# Patient Record
Sex: Male | Born: 1945 | Race: White | Hispanic: No | Smoking: Former smoker
Health system: Southern US, Community
[De-identification: ages and names within clinical notes are randomized; demographics above are authoritative.]

## PROBLEM LIST (undated history)

## (undated) DIAGNOSIS — F32A Depression, unspecified: Secondary | ICD-10-CM

## (undated) DIAGNOSIS — F329 Major depressive disorder, single episode, unspecified: Secondary | ICD-10-CM

## (undated) DIAGNOSIS — T7840XA Allergy, unspecified, initial encounter: Secondary | ICD-10-CM

## (undated) DIAGNOSIS — N179 Acute kidney failure, unspecified: Secondary | ICD-10-CM

## (undated) DIAGNOSIS — F1010 Alcohol abuse, uncomplicated: Secondary | ICD-10-CM

## (undated) DIAGNOSIS — I471 Supraventricular tachycardia, unspecified: Secondary | ICD-10-CM

## (undated) DIAGNOSIS — S0181XA Laceration without foreign body of other part of head, initial encounter: Secondary | ICD-10-CM

## (undated) DIAGNOSIS — E785 Hyperlipidemia, unspecified: Secondary | ICD-10-CM

## (undated) DIAGNOSIS — I4892 Unspecified atrial flutter: Secondary | ICD-10-CM

## (undated) DIAGNOSIS — I10 Essential (primary) hypertension: Secondary | ICD-10-CM

## (undated) DIAGNOSIS — D649 Anemia, unspecified: Secondary | ICD-10-CM

## (undated) DIAGNOSIS — I771 Stricture of artery: Secondary | ICD-10-CM

## (undated) DIAGNOSIS — Z9289 Personal history of other medical treatment: Secondary | ICD-10-CM

## (undated) DIAGNOSIS — I2510 Atherosclerotic heart disease of native coronary artery without angina pectoris: Secondary | ICD-10-CM

## (undated) DIAGNOSIS — N320 Bladder-neck obstruction: Secondary | ICD-10-CM

## (undated) DIAGNOSIS — L8990 Pressure ulcer of unspecified site, unspecified stage: Secondary | ICD-10-CM

## (undated) DIAGNOSIS — N189 Chronic kidney disease, unspecified: Secondary | ICD-10-CM

## (undated) DIAGNOSIS — M1990 Unspecified osteoarthritis, unspecified site: Secondary | ICD-10-CM

## (undated) DIAGNOSIS — S82401A Unspecified fracture of shaft of right fibula, initial encounter for closed fracture: Secondary | ICD-10-CM

## (undated) DIAGNOSIS — D62 Acute posthemorrhagic anemia: Secondary | ICD-10-CM

## (undated) DIAGNOSIS — S93326A Dislocation of tarsometatarsal joint of unspecified foot, initial encounter: Secondary | ICD-10-CM

## (undated) DIAGNOSIS — J942 Hemothorax: Secondary | ICD-10-CM

## (undated) DIAGNOSIS — K219 Gastro-esophageal reflux disease without esophagitis: Secondary | ICD-10-CM

## (undated) DIAGNOSIS — F419 Anxiety disorder, unspecified: Secondary | ICD-10-CM

## (undated) HISTORY — DX: Laceration without foreign body of other part of head, initial encounter: S01.81XA

## (undated) HISTORY — DX: Hyperlipidemia, unspecified: E78.5

## (undated) HISTORY — DX: Pressure ulcer of unspecified site, unspecified stage: L89.90

## (undated) HISTORY — PX: TONSILECTOMY, ADENOIDECTOMY, BILATERAL MYRINGOTOMY AND TUBES: SHX2538

## (undated) HISTORY — DX: Alcohol abuse, uncomplicated: F10.10

## (undated) HISTORY — DX: Unspecified atrial flutter: I48.92

## (undated) HISTORY — DX: Supraventricular tachycardia, unspecified: I47.10

## (undated) HISTORY — DX: Acute posthemorrhagic anemia: D62

## (undated) HISTORY — DX: Acute kidney failure, unspecified: N17.9

## (undated) HISTORY — PX: APPENDECTOMY: SHX54

## (undated) HISTORY — DX: Personal history of other medical treatment: Z92.89

## (undated) HISTORY — DX: Chronic kidney disease, unspecified: N18.9

## (undated) HISTORY — DX: Essential (primary) hypertension: I10

## (undated) HISTORY — DX: Stricture of artery: I77.1

## (undated) HISTORY — DX: Bladder-neck obstruction: N32.0

## (undated) HISTORY — DX: Hemothorax: J94.2

## (undated) HISTORY — DX: Unspecified fracture of shaft of right fibula, initial encounter for closed fracture: S82.401A

## (undated) HISTORY — DX: Allergy, unspecified, initial encounter: T78.40XA

## (undated) HISTORY — DX: Supraventricular tachycardia: I47.1

## (undated) HISTORY — DX: Anemia, unspecified: D64.9

## (undated) HISTORY — DX: Dislocation of tarsometatarsal joint of unspecified foot, initial encounter: S93.326A

## (undated) HISTORY — PX: HERNIA REPAIR: SHX51

---

## 1998-12-30 ENCOUNTER — Encounter: Payer: Self-pay | Admitting: Internal Medicine

## 1998-12-30 LAB — CONVERTED CEMR LAB

## 2002-05-26 ENCOUNTER — Encounter: Payer: Self-pay | Admitting: Internal Medicine

## 2002-09-05 ENCOUNTER — Encounter: Payer: Self-pay | Admitting: Internal Medicine

## 2002-09-12 ENCOUNTER — Encounter: Payer: Self-pay | Admitting: Internal Medicine

## 2004-02-26 ENCOUNTER — Ambulatory Visit: Payer: Self-pay | Admitting: Internal Medicine

## 2004-05-19 ENCOUNTER — Ambulatory Visit: Payer: Self-pay | Admitting: Internal Medicine

## 2004-05-27 ENCOUNTER — Ambulatory Visit: Payer: Self-pay | Admitting: Internal Medicine

## 2004-09-23 ENCOUNTER — Ambulatory Visit: Payer: Self-pay | Admitting: Internal Medicine

## 2004-09-30 ENCOUNTER — Ambulatory Visit: Payer: Self-pay | Admitting: Internal Medicine

## 2004-12-02 ENCOUNTER — Ambulatory Visit: Payer: Self-pay | Admitting: Internal Medicine

## 2004-12-14 ENCOUNTER — Ambulatory Visit: Payer: Self-pay | Admitting: Internal Medicine

## 2004-12-20 ENCOUNTER — Ambulatory Visit: Payer: Self-pay | Admitting: Internal Medicine

## 2004-12-23 ENCOUNTER — Ambulatory Visit: Payer: Self-pay | Admitting: Internal Medicine

## 2005-02-03 ENCOUNTER — Ambulatory Visit: Payer: Self-pay | Admitting: Internal Medicine

## 2005-03-03 ENCOUNTER — Ambulatory Visit (HOSPITAL_COMMUNITY): Admission: RE | Admit: 2005-03-03 | Discharge: 2005-03-03 | Payer: Self-pay | Admitting: Surgery

## 2005-06-09 ENCOUNTER — Ambulatory Visit: Payer: Self-pay | Admitting: Internal Medicine

## 2005-11-01 ENCOUNTER — Emergency Department (HOSPITAL_COMMUNITY): Admission: EM | Admit: 2005-11-01 | Discharge: 2005-11-01 | Payer: Self-pay | Admitting: Emergency Medicine

## 2005-12-07 ENCOUNTER — Ambulatory Visit: Payer: Self-pay | Admitting: Internal Medicine

## 2005-12-14 ENCOUNTER — Ambulatory Visit: Payer: Self-pay | Admitting: Internal Medicine

## 2006-04-05 ENCOUNTER — Ambulatory Visit: Payer: Self-pay | Admitting: Internal Medicine

## 2006-04-05 LAB — CONVERTED CEMR LAB
ALT: 29 units/L (ref 0–40)
AST: 38 units/L — ABNORMAL HIGH (ref 0–37)
Albumin: 3.6 g/dL (ref 3.5–5.2)
Alkaline Phosphatase: 37 units/L — ABNORMAL LOW (ref 39–117)
Bilirubin, Direct: 0.2 mg/dL (ref 0.0–0.3)
Chol/HDL Ratio, serum: 3.2
Cholesterol: 140 mg/dL (ref 0–200)
HDL: 44.4 mg/dL (ref >39.0)
LDL Cholesterol: 82 mg/dL (ref 0–99)
Total Bilirubin: 0.7 mg/dL (ref 0.3–1.2)
Total Protein: 6.4 g/dL (ref 6.0–8.3)
Triglyceride fasting, serum: 67 mg/dL (ref 0–149)
VLDL: 13 mg/dL (ref 0–40)

## 2006-09-29 ENCOUNTER — Emergency Department (HOSPITAL_COMMUNITY): Admission: EM | Admit: 2006-09-29 | Discharge: 2006-09-29 | Payer: Self-pay | Admitting: Emergency Medicine

## 2006-10-05 ENCOUNTER — Emergency Department (HOSPITAL_COMMUNITY): Admission: EM | Admit: 2006-10-05 | Discharge: 2006-10-05 | Payer: Self-pay | Admitting: Emergency Medicine

## 2006-12-11 ENCOUNTER — Encounter: Payer: Self-pay | Admitting: Internal Medicine

## 2006-12-11 DIAGNOSIS — M109 Gout, unspecified: Secondary | ICD-10-CM

## 2006-12-11 DIAGNOSIS — K7030 Alcoholic cirrhosis of liver without ascites: Secondary | ICD-10-CM

## 2006-12-11 DIAGNOSIS — R945 Abnormal results of liver function studies: Secondary | ICD-10-CM | POA: Insufficient documentation

## 2006-12-11 DIAGNOSIS — E785 Hyperlipidemia, unspecified: Secondary | ICD-10-CM | POA: Insufficient documentation

## 2006-12-11 DIAGNOSIS — J309 Allergic rhinitis, unspecified: Secondary | ICD-10-CM | POA: Insufficient documentation

## 2007-01-21 ENCOUNTER — Ambulatory Visit: Payer: Self-pay | Admitting: Internal Medicine

## 2007-01-21 LAB — CONVERTED CEMR LAB
ALT: 23 units/L (ref 0–53)
AST: 38 units/L — ABNORMAL HIGH (ref 0–37)
Albumin: 4.1 g/dL (ref 3.5–5.2)
Alkaline Phosphatase: 40 units/L (ref 39–117)
BUN: 13 mg/dL (ref 6–23)
Basophils Absolute: 0 10*3/uL (ref 0.0–0.1)
Basophils Relative: 0.5 % (ref 0.0–1.0)
Bilirubin Urine: NEGATIVE
Bilirubin, Direct: 0.1 mg/dL (ref 0.0–0.3)
Blood in Urine, dipstick: NEGATIVE
CO2: 24 meq/L (ref 19–32)
Calcium: 10.3 mg/dL (ref 8.4–10.5)
Chloride: 114 meq/L — ABNORMAL HIGH (ref 96–112)
Cholesterol: 173 mg/dL (ref 0–200)
Creatinine, Ser: 1.2 mg/dL (ref 0.4–1.5)
Eosinophils Absolute: 0.4 10*3/uL (ref 0.0–0.6)
Eosinophils Relative: 4.2 % (ref 0.0–5.0)
GFR calc Af Amer: 79 mL/min
GFR calc non Af Amer: 65 mL/min
Glucose, Bld: 111 mg/dL — ABNORMAL HIGH (ref 70–99)
Glucose, Urine, Semiquant: NEGATIVE
HCT: 42.6 % (ref 39.0–52.0)
HDL: 37.1 mg/dL — ABNORMAL LOW (ref >39.0)
Hemoglobin: 15 g/dL (ref 13.0–17.0)
Ketones, urine, test strip: NEGATIVE
LDL Cholesterol: 121 mg/dL — ABNORMAL HIGH (ref 0–99)
Lymphocytes Relative: 23.1 % (ref 12.0–46.0)
MCHC: 35.2 g/dL (ref 30.0–36.0)
MCV: 93.6 fL (ref 78.0–100.0)
Monocytes Absolute: 0.8 10*3/uL — ABNORMAL HIGH (ref 0.2–0.7)
Monocytes Relative: 9.6 % (ref 3.0–11.0)
Neutro Abs: 5.6 10*3/uL (ref 1.4–7.7)
Neutrophils Relative %: 62.6 % (ref 43.0–77.0)
Nitrite: NEGATIVE
PSA: 0.3 ng/mL (ref 0.10–4.00)
Platelets: 214 10*3/uL (ref 150–400)
Potassium: 6.1 meq/L (ref 3.5–5.1)
Protein, U semiquant: NEGATIVE
RBC: 4.56 M/uL (ref 4.22–5.81)
RDW: 12.6 % (ref 11.5–14.6)
Sodium: 149 meq/L — ABNORMAL HIGH (ref 135–145)
Specific Gravity, Urine: 1.02
TSH: 1.1 microintl units/mL (ref 0.35–5.50)
Total Bilirubin: 0.6 mg/dL (ref 0.3–1.2)
Total CHOL/HDL Ratio: 4.7
Total Protein: 7.2 g/dL (ref 6.0–8.3)
Triglycerides: 75 mg/dL (ref 0–149)
Urobilinogen, UA: 0.2
VLDL: 15 mg/dL (ref 0–40)
WBC Urine, dipstick: NEGATIVE
WBC: 8.8 10*3/uL (ref 4.5–10.5)
pH: 5

## 2007-01-22 ENCOUNTER — Ambulatory Visit: Payer: Self-pay | Admitting: Internal Medicine

## 2007-01-22 LAB — CONVERTED CEMR LAB: Potassium: 4.6 meq/L (ref 3.5–5.1)

## 2007-01-23 ENCOUNTER — Telehealth: Payer: Self-pay | Admitting: Internal Medicine

## 2007-01-28 ENCOUNTER — Ambulatory Visit: Payer: Self-pay | Admitting: Internal Medicine

## 2007-04-03 ENCOUNTER — Ambulatory Visit: Payer: Self-pay | Admitting: Internal Medicine

## 2007-08-12 ENCOUNTER — Ambulatory Visit: Payer: Self-pay | Admitting: Internal Medicine

## 2007-08-12 LAB — CONVERTED CEMR LAB
ALT: 39 units/L (ref 0–53)
AST: 42 units/L — ABNORMAL HIGH (ref 0–37)
Albumin: 4 g/dL (ref 3.5–5.2)
Alkaline Phosphatase: 40 units/L (ref 39–117)
BUN: 9 mg/dL (ref 6–23)
CO2: 29 meq/L (ref 19–32)
Chloride: 110 meq/L (ref 96–112)
Cholesterol: 195 mg/dL (ref 0–200)
Creatinine, Ser: 1 mg/dL (ref 0.4–1.5)
Glucose, Bld: 100 mg/dL — ABNORMAL HIGH (ref 70–99)
LDL Cholesterol: 125 mg/dL — ABNORMAL HIGH (ref 0–99)
Potassium: 4.1 meq/L (ref 3.5–5.1)
Triglycerides: 135 mg/dL (ref 0–149)

## 2007-11-20 ENCOUNTER — Encounter: Admission: RE | Admit: 2007-11-20 | Discharge: 2007-11-20 | Payer: Self-pay | Admitting: Orthopedic Surgery

## 2007-12-24 ENCOUNTER — Telehealth: Payer: Self-pay | Admitting: Internal Medicine

## 2008-01-13 ENCOUNTER — Telehealth: Payer: Self-pay | Admitting: Internal Medicine

## 2008-01-31 ENCOUNTER — Ambulatory Visit: Payer: Self-pay | Admitting: Internal Medicine

## 2008-01-31 LAB — CONVERTED CEMR LAB
ALT: 47 units/L (ref 0–53)
AST: 49 units/L — ABNORMAL HIGH (ref 0–37)
Albumin: 4.4 g/dL (ref 3.5–5.2)
Alkaline Phosphatase: 40 units/L (ref 39–117)
BUN: 10 mg/dL (ref 6–23)
Basophils Relative: 0.6 % (ref 0.0–3.0)
Blood in Urine, dipstick: NEGATIVE
Chloride: 108 meq/L (ref 96–112)
Creatinine, Ser: 1.1 mg/dL (ref 0.4–1.5)
Eosinophils Relative: 3.4 % (ref 0.0–5.0)
Glucose, Bld: 112 mg/dL — ABNORMAL HIGH (ref 70–99)
Glucose, Urine, Semiquant: NEGATIVE
HCT: 43.9 % (ref 39.0–52.0)
HDL: 39.4 mg/dL (ref >39.0)
MCV: 96.3 fL (ref 78.0–100.0)
Monocytes Relative: 11.5 % (ref 3.0–12.0)
Neutrophils Relative %: 57.2 % (ref 43.0–77.0)
Nitrite: NEGATIVE
PSA: 0.35 ng/mL (ref 0.10–4.00)
Platelets: 190 10*3/uL (ref 150–400)
Potassium: 4.9 meq/L (ref 3.5–5.1)
RBC: 4.55 M/uL (ref 4.22–5.81)
Specific Gravity, Urine: 1.025
Total CHOL/HDL Ratio: 4.9
Total Protein: 7.9 g/dL (ref 6.0–8.3)
VLDL: 28 mg/dL (ref 0–40)
WBC Urine, dipstick: NEGATIVE
WBC: 6.3 10*3/uL (ref 4.5–10.5)
pH: 5.5

## 2008-02-06 ENCOUNTER — Ambulatory Visit: Payer: Self-pay | Admitting: Internal Medicine

## 2008-02-21 ENCOUNTER — Telehealth: Payer: Self-pay | Admitting: Internal Medicine

## 2008-04-17 HISTORY — PX: PARTIAL HIP ARTHROPLASTY: SHX733

## 2008-08-21 ENCOUNTER — Telehealth: Payer: Self-pay | Admitting: Internal Medicine

## 2008-08-28 ENCOUNTER — Inpatient Hospital Stay (HOSPITAL_COMMUNITY): Admission: RE | Admit: 2008-08-28 | Discharge: 2008-09-04 | Payer: Self-pay | Admitting: Orthopedic Surgery

## 2008-08-28 HISTORY — PX: REVISION TOTAL HIP ARTHROPLASTY: SHX766

## 2009-02-05 ENCOUNTER — Ambulatory Visit: Payer: Self-pay | Admitting: Internal Medicine

## 2009-02-05 LAB — CONVERTED CEMR LAB
ALT: 39 units/L (ref 0–53)
Alkaline Phosphatase: 47 units/L (ref 39–117)
Basophils Relative: 0.9 % (ref 0.0–3.0)
Bilirubin, Direct: 0.1 mg/dL (ref 0.0–0.3)
Chloride: 106 meq/L (ref 96–112)
Cholesterol: 220 mg/dL — ABNORMAL HIGH (ref 0–200)
Creatinine, Ser: 1 mg/dL (ref 0.4–1.5)
Eosinophils Relative: 4.6 % (ref 0.0–5.0)
Hemoglobin: 15.7 g/dL (ref 13.0–17.0)
Lymphocytes Relative: 25.4 % (ref 12.0–46.0)
MCV: 96.6 fL (ref 78.0–100.0)
Monocytes Absolute: 0.6 10*3/uL (ref 0.1–1.0)
Neutro Abs: 3.2 10*3/uL (ref 1.4–7.7)
Neutrophils Relative %: 58.5 % (ref 43.0–77.0)
Nitrite: NEGATIVE
Protein, U semiquant: NEGATIVE
RBC: 4.76 M/uL (ref 4.22–5.81)
Sodium: 144 meq/L (ref 135–145)
Total CHOL/HDL Ratio: 5
Total Protein: 7.8 g/dL (ref 6.0–8.3)
Urobilinogen, UA: 0.2
VLDL: 32 mg/dL (ref 0.0–40.0)
WBC Urine, dipstick: NEGATIVE
WBC: 5.6 10*3/uL (ref 4.5–10.5)

## 2009-02-12 ENCOUNTER — Ambulatory Visit: Payer: Self-pay | Admitting: Internal Medicine

## 2009-03-19 ENCOUNTER — Telehealth: Payer: Self-pay | Admitting: Internal Medicine

## 2009-05-14 ENCOUNTER — Ambulatory Visit: Payer: Self-pay | Admitting: Internal Medicine

## 2009-05-14 DIAGNOSIS — K219 Gastro-esophageal reflux disease without esophagitis: Secondary | ICD-10-CM | POA: Insufficient documentation

## 2009-05-19 LAB — CONVERTED CEMR LAB
HDL: 54.9 mg/dL (ref >39.00)
TSH: 1.24 microintl units/mL (ref 0.35–5.50)
Total Bilirubin: 1 mg/dL (ref 0.3–1.2)
Total CHOL/HDL Ratio: 3
VLDL: 26.6 mg/dL (ref 0.0–40.0)

## 2009-05-21 ENCOUNTER — Telehealth: Payer: Self-pay | Admitting: Internal Medicine

## 2009-06-11 ENCOUNTER — Telehealth: Payer: Self-pay | Admitting: Internal Medicine

## 2009-06-17 ENCOUNTER — Telehealth: Payer: Self-pay | Admitting: Internal Medicine

## 2009-08-03 ENCOUNTER — Encounter
Admission: RE | Admit: 2009-08-03 | Discharge: 2009-08-03 | Payer: Self-pay | Admitting: Physical Medicine & Rehabilitation

## 2009-08-03 ENCOUNTER — Ambulatory Visit: Payer: Self-pay | Admitting: Physical Medicine & Rehabilitation

## 2009-09-14 ENCOUNTER — Telehealth: Payer: Self-pay | Admitting: Internal Medicine

## 2009-09-15 ENCOUNTER — Encounter (INDEPENDENT_AMBULATORY_CARE_PROVIDER_SITE_OTHER): Payer: Self-pay | Admitting: *Deleted

## 2010-02-17 ENCOUNTER — Ambulatory Visit: Payer: Self-pay | Admitting: Family Medicine

## 2010-03-02 ENCOUNTER — Inpatient Hospital Stay (HOSPITAL_COMMUNITY): Admission: EM | Admit: 2010-03-02 | Discharge: 2010-03-03 | Payer: Self-pay | Admitting: Emergency Medicine

## 2010-03-15 ENCOUNTER — Ambulatory Visit: Payer: Self-pay | Admitting: Internal Medicine

## 2010-03-15 DIAGNOSIS — D649 Anemia, unspecified: Secondary | ICD-10-CM

## 2010-03-17 ENCOUNTER — Telehealth: Payer: Self-pay | Admitting: Internal Medicine

## 2010-03-17 LAB — CONVERTED CEMR LAB
Alkaline Phosphatase: 60 units/L (ref 39–117)
Bilirubin, Direct: 0.2 mg/dL (ref 0.0–0.3)
CO2: 27 meq/L (ref 19–32)
Calcium: 8.8 mg/dL (ref 8.4–10.5)
Chloride: 98 meq/L (ref 96–112)
Eosinophils Relative: 1.7 % (ref 0.0–5.0)
HCT: 33.4 % — ABNORMAL LOW (ref 39.0–52.0)
Hemoglobin: 11.4 g/dL — ABNORMAL LOW (ref 13.0–17.0)
Lymphs Abs: 1.2 10*3/uL (ref 0.7–4.0)
Monocytes Relative: 12.1 % — ABNORMAL HIGH (ref 3.0–12.0)
Neutro Abs: 9.9 10*3/uL — ABNORMAL HIGH (ref 1.4–7.7)
RBC: 3.39 M/uL — ABNORMAL LOW (ref 4.22–5.81)
Sed Rate: >120 mm/hr (ref 0–22)
Sodium: 134 meq/L — ABNORMAL LOW (ref 135–145)
Total Protein: 6.9 g/dL (ref 6.0–8.3)
Vitamin B-12: 932 pg/mL — ABNORMAL HIGH (ref 211–911)
WBC: 12.9 10*3/uL — ABNORMAL HIGH (ref 4.5–10.5)

## 2010-03-18 ENCOUNTER — Ambulatory Visit (HOSPITAL_COMMUNITY)
Admission: RE | Admit: 2010-03-18 | Discharge: 2010-03-18 | Payer: Self-pay | Source: Home / Self Care | Admitting: Internal Medicine

## 2010-03-18 ENCOUNTER — Ambulatory Visit: Payer: Self-pay | Admitting: Internal Medicine

## 2010-03-25 ENCOUNTER — Ambulatory Visit: Payer: Self-pay | Admitting: Internal Medicine

## 2010-03-28 ENCOUNTER — Ambulatory Visit: Payer: Self-pay | Admitting: Internal Medicine

## 2010-03-28 DIAGNOSIS — J069 Acute upper respiratory infection, unspecified: Secondary | ICD-10-CM | POA: Insufficient documentation

## 2010-03-30 ENCOUNTER — Ambulatory Visit (HOSPITAL_COMMUNITY)
Admission: RE | Admit: 2010-03-30 | Discharge: 2010-03-30 | Payer: Self-pay | Source: Home / Self Care | Attending: Internal Medicine | Admitting: Internal Medicine

## 2010-04-01 ENCOUNTER — Telehealth: Payer: Self-pay | Admitting: Internal Medicine

## 2010-04-06 ENCOUNTER — Ambulatory Visit: Payer: Self-pay | Admitting: Cardiology

## 2010-04-07 ENCOUNTER — Encounter: Payer: Self-pay | Admitting: Internal Medicine

## 2010-04-07 DIAGNOSIS — J90 Pleural effusion, not elsewhere classified: Secondary | ICD-10-CM | POA: Insufficient documentation

## 2010-04-08 ENCOUNTER — Ambulatory Visit: Payer: Self-pay | Admitting: Internal Medicine

## 2010-04-12 ENCOUNTER — Telehealth: Payer: Self-pay | Admitting: Internal Medicine

## 2010-04-14 ENCOUNTER — Ambulatory Visit: Payer: Self-pay | Admitting: Thoracic Surgery

## 2010-04-19 ENCOUNTER — Encounter: Payer: Self-pay | Admitting: Thoracic Surgery

## 2010-04-19 ENCOUNTER — Encounter: Payer: Self-pay | Admitting: Internal Medicine

## 2010-04-19 ENCOUNTER — Inpatient Hospital Stay (HOSPITAL_COMMUNITY)
Admission: RE | Admit: 2010-04-19 | Discharge: 2010-04-24 | Payer: Self-pay | Source: Home / Self Care | Attending: Thoracic Surgery | Admitting: Thoracic Surgery

## 2010-04-20 LAB — BASIC METABOLIC PANEL
BUN: 4 mg/dL — ABNORMAL LOW (ref 6–23)
CO2: 26 mEq/L (ref 19–32)
Calcium: 7.7 mg/dL — ABNORMAL LOW (ref 8.4–10.5)
Chloride: 103 mEq/L (ref 96–112)
Creatinine, Ser: 0.83 mg/dL (ref 0.4–1.5)
GFR calc Af Amer: >60 mL/min (ref >60)
GFR calc non Af Amer: >60 mL/min (ref >60)
Glucose, Bld: 183 mg/dL — ABNORMAL HIGH (ref 70–99)
Potassium: 4.2 mEq/L (ref 3.5–5.1)
Sodium: 136 mEq/L (ref 135–145)

## 2010-04-20 LAB — POCT I-STAT 3, ART BLOOD GAS (G3+)
Acid-base deficit: 1 mmol/L (ref 0.0–2.0)
Acid-base deficit: 13 mmol/L — ABNORMAL HIGH (ref 0.0–2.0)
Bicarbonate: 10.4 mEq/L — ABNORMAL LOW (ref 20.0–24.0)
Bicarbonate: 24 mEq/L (ref 20.0–24.0)
O2 Saturation: 94 %
O2 Saturation: 98 %
Patient temperature: 97.9
Patient temperature: 97.9
TCO2: 11 mmol/L (ref 0–100)
TCO2: 25 mmol/L (ref 0–100)
pCO2 arterial: 13.5 mmHg — CL (ref 35.0–45.0)
pCO2 arterial: 39.7 mmHg (ref 35.0–45.0)
pH, Arterial: 7.387 (ref 7.350–7.450)
pH, Arterial: 7.495 — ABNORMAL HIGH (ref 7.350–7.450)
pO2, Arterial: 69 mmHg — ABNORMAL LOW (ref 80.0–100.0)
pO2, Arterial: 83 mmHg (ref 80.0–100.0)

## 2010-04-20 LAB — VITAMIN D 25 HYDROXY (VIT D DEFICIENCY, FRACTURES): Vit D, 25-Hydroxy: 16 ng/mL — ABNORMAL LOW (ref 30–89)

## 2010-04-20 LAB — CBC
HCT: 31 % — ABNORMAL LOW (ref 39.0–52.0)
Hemoglobin: 9.7 g/dL — ABNORMAL LOW (ref 13.0–17.0)
MCH: 28.4 pg (ref 26.0–34.0)
MCHC: 31.3 g/dL (ref 30.0–36.0)
MCV: 90.9 fL (ref 78.0–100.0)
Platelets: 208 10*3/uL (ref 150–400)
RBC: 3.41 MIL/uL — ABNORMAL LOW (ref 4.22–5.81)
RDW: 16.1 % — ABNORMAL HIGH (ref 11.5–15.5)
WBC: 11.9 10*3/uL — ABNORMAL HIGH (ref 4.0–10.5)

## 2010-04-21 LAB — CBC
HCT: 31.4 % — ABNORMAL LOW (ref 39.0–52.0)
Hemoglobin: 9.9 g/dL — ABNORMAL LOW (ref 13.0–17.0)
MCH: 28.9 pg (ref 26.0–34.0)
MCHC: 31.5 g/dL (ref 30.0–36.0)
MCV: 91.5 fL (ref 78.0–100.0)
Platelets: 219 10*3/uL (ref 150–400)
RBC: 3.43 MIL/uL — ABNORMAL LOW (ref 4.22–5.81)
RDW: 16 % — ABNORMAL HIGH (ref 11.5–15.5)
WBC: 9.5 10*3/uL (ref 4.0–10.5)

## 2010-04-21 LAB — COMPREHENSIVE METABOLIC PANEL
ALT: 14 U/L (ref 0–53)
AST: 24 U/L (ref 0–37)
Albumin: 2.3 g/dL — ABNORMAL LOW (ref 3.5–5.2)
Alkaline Phosphatase: 40 U/L (ref 39–117)
BUN: 2 mg/dL — ABNORMAL LOW (ref 6–23)
CO2: 30 mEq/L (ref 19–32)
Calcium: 8.4 mg/dL (ref 8.4–10.5)
Chloride: 104 mEq/L (ref 96–112)
Creatinine, Ser: 0.92 mg/dL (ref 0.4–1.5)
GFR calc Af Amer: >60 mL/min (ref >60)
GFR calc non Af Amer: >60 mL/min (ref >60)
Glucose, Bld: 161 mg/dL — ABNORMAL HIGH (ref 70–99)
Potassium: 3.7 mEq/L (ref 3.5–5.1)
Sodium: 138 mEq/L (ref 135–145)
Total Bilirubin: 0.4 mg/dL (ref 0.3–1.2)
Total Protein: 5.8 g/dL — ABNORMAL LOW (ref 6.0–8.3)

## 2010-04-21 LAB — CARDIAC PANEL(CRET KIN+CKTOT+MB+TROPI)
CK, MB: 1.3 ng/mL (ref 0.3–4.0)
Relative Index: 0.4 (ref 0.0–2.5)
Total CK: 333 U/L — ABNORMAL HIGH (ref 7–232)
Troponin I: 0.04 ng/mL (ref 0.00–0.06)

## 2010-04-22 LAB — CARDIAC PANEL(CRET KIN+CKTOT+MB+TROPI)
CK, MB: 1 ng/mL (ref 0.3–4.0)
Relative Index: 0.3 (ref 0.0–2.5)
Total CK: 295 U/L — ABNORMAL HIGH (ref 7–232)
Troponin I: 0.03 ng/mL (ref 0.00–0.06)

## 2010-05-02 LAB — HEPATIC FUNCTION PANEL
ALT: 13 U/L (ref 0–53)
AST: 26 U/L (ref 0–37)
Albumin: 2.3 g/dL — ABNORMAL LOW (ref 3.5–5.2)
Alkaline Phosphatase: 42 U/L (ref 39–117)
Bilirubin, Direct: 0.1 mg/dL (ref 0.0–0.3)
Indirect Bilirubin: 0.3 mg/dL (ref 0.3–0.9)
Total Bilirubin: 0.4 mg/dL (ref 0.3–1.2)
Total Protein: 6 g/dL (ref 6.0–8.3)

## 2010-05-02 LAB — BASIC METABOLIC PANEL
BUN: 2 mg/dL — ABNORMAL LOW (ref 6–23)
BUN: 2 mg/dL — ABNORMAL LOW (ref 6–23)
CO2: 27 mEq/L (ref 19–32)
CO2: 25 mEq/L (ref 19–32)
Calcium: 8.4 mg/dL (ref 8.4–10.5)
Calcium: 8.2 mg/dL — ABNORMAL LOW (ref 8.4–10.5)
Chloride: 100 mEq/L (ref 96–112)
Chloride: 103 mEq/L (ref 96–112)
Creatinine, Ser: 0.95 mg/dL (ref 0.4–1.5)
Creatinine, Ser: 0.91 mg/dL (ref 0.4–1.5)
GFR calc Af Amer: >60 mL/min (ref >60)
GFR calc Af Amer: >60 mL/min (ref >60)
GFR calc non Af Amer: >60 mL/min (ref >60)
GFR calc non Af Amer: >60 mL/min (ref >60)
Glucose, Bld: 119 mg/dL — ABNORMAL HIGH (ref 70–99)
Glucose, Bld: 152 mg/dL — ABNORMAL HIGH (ref 70–99)
Potassium: 3.3 mEq/L — ABNORMAL LOW (ref 3.5–5.1)
Potassium: 3.2 mEq/L — ABNORMAL LOW (ref 3.5–5.1)
Sodium: 136 mEq/L (ref 135–145)
Sodium: 137 mEq/L (ref 135–145)

## 2010-05-02 LAB — CBC
HCT: 30.1 % — ABNORMAL LOW (ref 39.0–52.0)
Hemoglobin: 9.6 g/dL — ABNORMAL LOW (ref 13.0–17.0)
MCH: 28.5 pg (ref 26.0–34.0)
MCHC: 31.9 g/dL (ref 30.0–36.0)
MCV: 89.3 fL (ref 78.0–100.0)
Platelets: 284 10*3/uL (ref 150–400)
RBC: 3.37 MIL/uL — ABNORMAL LOW (ref 4.22–5.81)
RDW: 15.9 % — ABNORMAL HIGH (ref 11.5–15.5)
WBC: 9.8 10*3/uL (ref 4.0–10.5)

## 2010-05-03 ENCOUNTER — Encounter
Admission: RE | Admit: 2010-05-03 | Discharge: 2010-05-03 | Payer: Self-pay | Source: Home / Self Care | Attending: Thoracic Surgery | Admitting: Thoracic Surgery

## 2010-05-03 ENCOUNTER — Ambulatory Visit
Admission: RE | Admit: 2010-05-03 | Discharge: 2010-05-03 | Payer: Self-pay | Source: Home / Self Care | Attending: Thoracic Surgery | Admitting: Thoracic Surgery

## 2010-05-19 NOTE — Progress Notes (Signed)
Summary: chest xray results  Phone Note Call from Patient Call back at Home Phone [phone]   Caller: Patient Call For: Birdie Sons MD Summary of Call: Pt is calling for chest xray results. Initial call taken by: [facility] CMA AAMA,  April 01, 2010 10:53 AM  Follow-up for Phone Call        Pt is calling back again for chest xray results. Follow-up by: Lynann Beaver CMA AAMA,  April 04, 2010 2:14 PM  Additional Follow-up for Phone Call Additional follow up Details #1::        CXR reviewed. there is an area that may be slightly improved but note significantly. There is still some fluid around the lung.  Would recommend going back on abx (levaquin 500mg  by mouth once daily x7d-pls run interaction check) until PMD can review. Consider chest ct for further evalutaion.  Additional Follow-up by: Edwyna Perfect MD,  April 04, 2010 3:52 PM    Additional Follow-up for Phone Call Additional follow up Details #2::    Pt is still waiting to hear what he should do about getting additional testing done if needed re: CXR. Pls call asap.  Follow-up by: Lucy Antigua,  April 05, 2010 9:37 AM  Additional Follow-up for Phone Call Additional follow up Details #3:: Details for Additional Follow-up Action Taken: follow instructions per dr hodgin  see me 2-3 weeks. sooner as needed  Additional Follow-up by: Birdie Sons MD,  April 05, 2010 2:27 PM  New/Updated Medications: LEVAQUIN 500 MG TABS (LEVOFLOXACIN) Take 1 tab by mouth daily Prescriptions: LEVAQUIN 500 MG TABS (LEVOFLOXACIN) Take 1 tab by mouth daily  #7 x 0   Entered by:   Alfred Levins, CMA   Authorized by:   Birdie Sons MD   Signed by:   Alfred Levins, CMA on 04/04/2010   Method used:   Electronically to        [facility] (retail)       [address]       [place], [place]  [address]       Ph: [phone]       Fax: [phone]   RxID:   [id number]  pt aware   Alfred Levins, CMA  April 05, 2010 2:38 PM

## 2010-05-19 NOTE — Letter (Signed)
Summary: New Patient letter  [facility]  [address] [place], [place] [address]   Phone: [phone]  Fax: [phone]       09/15/2009 MRN: [id number]  Victor Diaz [address] [place], [place]  [address]  Dear Victor Diaz,  Welcome to the Gastroenterology Division at [facility].    You are scheduled to see Dr.   Leone Payor on 10-26-09 at 3pm on the 3rd floor at [facility], [address]  We ask that you try to arrive at our office 15 minutes prior to your appointment time to allow for check-in.  We would like you to complete the enclosed self-administered evaluation form prior to your visit and bring it with you on the day of your appointment.  We will review it with you.  Also, please bring a complete list of all your medications or, if you prefer, bring the medication bottles and we will list them.  Please bring your insurance card so that we may make a copy of it.  If your insurance requires a referral to see a specialist, please bring your referral form from your primary care physician.  Co-payments are due at the time of your visit and may be paid by cash, check or credit card.     Your office visit will consist of a consult with your physician (includes a physical exam), any laboratory testing he/she may order, scheduling of any necessary diagnostic testing (e.g. x-ray, ultrasound, CT-scan), and scheduling of a procedure (e.g. Endoscopy, Colonoscopy) if required.  Please allow enough time on your schedule to allow for any/all of these possibilities.    If you cannot keep your appointment, please call [phone] to cancel or reschedule prior to your appointment date.  This allows [place] the opportunity to schedule an appointment for another patient in need of care.  If you do not cancel or reschedule by 5 p.m. the business day prior to your appointment date, you will be charged a $50.00 late cancellation/no-show fee.    Thank you for choosing [facility] for your medical needs.  We appreciate the opportunity to care for you.  Please visit [place] at our website  to learn more about our practice.                     Sincerely,                                                             The Gastroenterology Division

## 2010-05-19 NOTE — Miscellaneous (Signed)
Summary: Orders Update  Clinical Lists Changes  Problems: Added new problem of PLEURAL EFFUSION, RIGHT (ICD-511.9) Orders: Added new Referral order of Pulmonary Referral (Pulmonary) - Signed

## 2010-05-19 NOTE — Assessment & Plan Note (Signed)
Summary: FEVER, CHILLS, SWOLLEN GLANDS // RS   Vital Signs:  Patient profile:   65 year old male Height:      65 inches Weight:      190 pounds O2 Sat:      93 % on Room air Temp:     99.1 degrees F oral Pulse rate:   120 / minute BP sitting:   100 / 60  (left arm) Cuff size:   regular  Vitals Entered By: Romualdo Bolk, CMA (AAMA) (March 28, 2010 2:45 PM)  O2 Flow:  Room air CC: Gland behind left ear swollen and painful started on 03/27/10   CC:  Gland behind left ear swollen and painful started on 03/27/10.  History of Present Illness: Patient presents to clinic as a workin for evaluation of swollen gland. Notes 4 days ago developed possible subjective fever unquantified without chills. Yesterday noted left posterior submandibular gland and left parotid gland pain. Chart review indicates recent hospitalization for pneumonia with associated unilateral pleural effusion. Underwent thoracentesis for 1.4 L with fluid described as bloody.  FAO>130.  Has improved slowly but still has mild intermittent DOE. Has close followup arranged with PMD with repeat CXR later this week.  Preventive Screening-Counseling & Management  Alcohol-Tobacco     Smoking Status: quit > 6 months     Year Quit: 1970  Caffeine-Diet-Exercise     Does Patient Exercise: no  Current Medications (verified): 1)  Simvastatin 40 Mg Tabs (Simvastatin) .... Take 1 Tablet By Mouth At Bedtime 2)  Alprazolam 1 Mg Tabs (Alprazolam) .... Take 1 Tablet By Mouth At Bedtime As Needed --#20 To Last A Full Month 3)  Zyrtec Allergy 10 Mg  Tabs (Cetirizine Hcl) .Marland Kitchen.. 1 Once Daily Prn 4)  Omeprazole 20 Mg Cpdr (Omeprazole) .... One By Mouth Daily 5)  Folic Acid 1 Mg Tabs (Folic Acid) .... Take 1 Tab By Mouth Daily 6)  Vitamin B-1 100 Mg Tabs (Thiamine Hcl) .Marland Kitchen.. 1 By Mouth Once Daily 7)  Hydrocodone-Acetaminophen 5-325 Mg Tabs (Hydrocodone-Acetaminophen) .Marland Kitchen.. 1 By Mouth Three Times A Day As Needed  Allergies  (verified): 1)  ! Lipitor (Atorvastatin Calcium)  Review of Systems      See HPI  Physical Exam  General:  Well-developed,well-nourished,in no acute distress; alert,appropriate and cooperative throughout examination Head:  Normocephalic and atraumatic without obvious abnormalities. No apparent alopecia or balding. Eyes:  corneas and lenses clear and no injection.   Ears:  External ear exam shows no significant lesions or deformities.  Otoscopic examination reveals clear canals, tympanic membranes are intact bilaterally without bulging, retraction, inflammation or discharge. Hearing is grossly normal bilaterally. Mouth:  Oral mucosa and oropharynx without lesions or exudates.   Neck:  Mild enlargement and tenderness of ?left posterior submandibular gland. Slight left parotid tenderness without significant difference in size compared to right.    Impression & Recommendations:  Problem # 1:  URI (ICD-465.9) Assessment New Suspect possible recent URI. Possible viral etiology however with recent cap and comorbidities begin empiric abx. Followup closely if no improvement or worsening.  His updated medication list for this problem includes:    Zyrtec Allergy 10 Mg Tabs (Cetirizine hcl) .Marland Kitchen... 1 once daily prn  Problem # 2:  PNEUMONIA, COMMUNITY ACQUIRED, (ICD-481) Assessment: Improved Encouraged close followup with PMD as scheduled with f/u CXR. States understanding and agreement.  Complete Medication List: 1)  Simvastatin 40 Mg Tabs (Simvastatin) .... Take 1 tablet by mouth at bedtime 2)  Alprazolam 1 Mg Tabs (  Alprazolam) .... Take 1 tablet by mouth at bedtime as needed --#20 to last a full month 3)  Zyrtec Allergy 10 Mg Tabs (Cetirizine hcl) .Marland Kitchen.. 1 once daily prn 4)  Omeprazole 20 Mg Cpdr (Omeprazole) .... One by mouth daily 5)  Folic Acid 1 Mg Tabs (Folic acid) .... Take 1 tab by mouth daily 6)  Vitamin B-1 100 Mg Tabs (Thiamine hcl) .Marland Kitchen.. 1 by mouth once daily 7)   Hydrocodone-acetaminophen 5-325 Mg Tabs (Hydrocodone-acetaminophen) .Marland Kitchen.. 1 by mouth three times a day as needed 8)  Amoxicillin 875 Mg Tabs (Amoxicillin) .... One by mouth bid  Patient Instructions: 1)  Please schedule a follow-up with Dr. Cato Mulligan or Dr. Rodena Medin if you do not improve or worsen.  Also please followup with Dr. Cato Mulligan as we discussed about your chest xray. Prescriptions: AMOXICILLIN 875 MG TABS (AMOXICILLIN) one by mouth bid  #14 x 0   Entered and Authorized by:   Edwyna Perfect MD   Signed by:   Edwyna Perfect MD on 03/28/2010   Method used:   Electronically to        [facility] (retail)       [address]       [place], [place]  [address]       Ph: [phone]       Fax: [phone]   RxID:   [id number]    Orders Added: 1)  Est. Patient Level IV [84132]

## 2010-05-19 NOTE — Progress Notes (Signed)
Summary: SOB  Phone Note Call from Patient Call back at Home Phone [phone]   Caller: Patient Call For: swords Summary of Call: pt is still having some SOB and pain in the rt rib area.  Per Dr Cato Mulligan call in Hydrocodone 5/325 1 by mouth three times a day as needed #20 no refills and send pt for CXR.  Called in rx and scheduled appt for tomorrow also put in order for pt to go have chest xray at [facility] [phone] Initial call taken by: Alfred Levins, CMA,  March 17, 2010 9:14 AM    New/Updated Medications: HYDROCODONE-ACETAMINOPHEN 5-325 MG TABS (HYDROCODONE-ACETAMINOPHEN) 1 by mouth three times a day as needed Prescriptions: HYDROCODONE-ACETAMINOPHEN 5-325 MG TABS (HYDROCODONE-ACETAMINOPHEN) 1 by mouth three times a day as needed  #20 x 0   Entered by:   Alfred Levins, CMA   Authorized by:   Birdie Sons MD   Signed by:   Alfred Levins, CMA on 03/17/2010   Method used:   Telephoned to ...       [facility] (retail)       [address]       [place], [place]  [address]       Ph: [phone]       Fax: [phone]   RxID:   [id number]

## 2010-05-19 NOTE — Progress Notes (Signed)
Summary: explain lab results, pain med  Phone Note Call from Patient Call back at Home Phone [phone]   Caller: Patient vm Call For: Birdie Sons MD Summary of Call: called on blood work and does not understand plus would like pain med called in until pain clinic appt. Initial call taken by: Gladis Riffle, RN,  May 21, 2009 12:14 PM  Follow-up for Phone Call        per dr swords may have generic vicodin5-325 Take 1 tablet by mouth once a day as needed pain #20/0 to last until can go to pain clinic.  Patient notified. wants to [employer].  Labs re-explained and pt seems to understand.  see Rx Follow-up by: Gladis Riffle, RN,  May 21, 2009 12:15 PM    New/Updated Medications: HYDROCODONE-ACETAMINOPHEN 5-325 MG TABS (HYDROCODONE-ACETAMINOPHEN) Take 1 tablet by mouth once a day as needed pain Prescriptions: HYDROCODONE-ACETAMINOPHEN 5-325 MG TABS (HYDROCODONE-ACETAMINOPHEN) Take 1 tablet by mouth once a day as needed pain  #20 x 0   Entered by:   Gladis Riffle, RN   Authorized by:   Birdie Sons MD   Signed by:   Gladis Riffle, RN on 05/21/2009   Method used:   Telephoned to ...       [facility] (retail)       [address]       [place], [place]  [address]       Ph: [phone]       Fax: [phone]   RxID:   [id number]

## 2010-05-19 NOTE — Assessment & Plan Note (Signed)
Summary: HEARTBURN / GASTRIC PROBLEMS // RS/PT RSC/CJR   Vital Signs:  Patient profile:   65 year old male Height:      65 inches Weight:      195 pounds BMI:     32.57 Temp:     97.9 degrees F Pulse rate:   68 / minute Resp:     12 per minute BP sitting:   110 / 72  (left arm)  Vitals Entered By: Gladis Riffle, RN (May 14, 2009 9:23 AM) CC: c/o heartburn Is Patient Diabetic? No   CC:  c/o heartburn.  History of Present Illness: GERD---ongoing sxs for years---well controlled with ranitidine. he stopped ranitidine and sxs recurred. He wonders whether there is anything better than ranitidine he uses ranitidine as needed , instead of daily.  Eventhough he made the appt for GERD he wants to discuss pain meds. It turns out that he has been taking vicodin two times a day since hip and knee surgery. Dr. Despina Hick is no longer filling vicodin.   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 Take 1 Tablet By Mouth At Bedtime 2)  Alprazolam 1 Mg Tabs (Alprazolam) .... Take 1 Tablet By Mouth At Bedtime As Needed --#20 To Last A Full Month 3)  Indomethacin 50 Mg  Caps (Indomethacin) .... Three Times A Day As Needed 4)  Fexofenadine Hcl 60 Mg  Tabs (Fexofenadine Hcl) .Marland Kitchen.. 1-2 Once Daily As Needed 5)  Acyclovir 200 Mg  Caps (Acyclovir) .... Take 1 Capsule By Mouth Four Times A Day X 10 Days As Needed 6)  Ranitidine Hcl 150 Mg Caps (Ranitidine Hcl) .... One By Mouth Bid  Allergies: 1)  ! Lipitor (Atorvastatin Calcium)  Comments:  Nurse/Medical  Assistant: c/o heartburn, asking for generic  The patient's medications and allergies were reviewed with the patient and were updated in the Medication and Allergy Lists. Gladis Riffle, RN (May 14, 2009 9:25 AM)  Past History:  Past Medical History: Last updated: 12/11/2006 Allergic rhinitis Gout Hyperlipidemia alcohol dependancy--elevated LFT's  Past Surgical History: Last updated: 02/12/2009 Appendectomy  age 70 Inguinal herniorrhaphy  2006 Tonsillectomy, adenoidectomy  age 15 THA 08/28/08  Family History: Last updated: 02/06/2007 father deceased in his 56's unknown cause mother cerebral hemorrhage-65  Social History: Last updated: 02/15/2008 wife deceased Alcohol use-yes---binge drinks for weeks at a time and then stops Regular exercise-no quit smoking at age 58 has 2 grandchildren that live with him (father of 2 grandkids intermittently lives with him) son with MS lives with him  Risk Factors: Exercise: no (February 06, 2007)  Risk Factors: Smoking Status: quit > 6 months (05/14/2009)  Social History: Smoking Status:  quit > 6 months  Review of Systems       All other systems reviewed and were negative   Physical Exam  General:  alert, well-developed, well-nourished, and well-hydrated.   Head:  normocephalic and atraumatic.   Eyes:  pupils equal and pupils round.   Ears:  R ear normal and L ear normal.   Neck:  No deformities, masses, or tenderness noted. Chest Wall:  Tenderness  over the right lateral ribcage between the xyphoid and umbilical levels.  No crepitus or deformity. Lungs:  Normal respiratory effort, chest expands symmetrically. Lungs are clear to auscultation, no crackles or wheezes. Heart:  Normal rate and regular rhythm. S1 and S2 normal without gallop, murmur, click, rub or other extra sounds. Abdomen:  Bowel sounds positive,abdomen soft and non-tender without masses, organomegaly or hernias noted. Msk:  No deformity or scoliosis noted of  thoracic or lumbar spine.   Neurologic:  cranial nerves II-XII intact and gait normal.     Impression & Recommendations:  Problem # 1:  GERD (ICD-530.81) stay on ranitidine His updated medication list for this problem includes:    Ranitidine Hcl 150 Mg Caps (Ranitidine hcl) ..... One by mouth bid  Problem # 2:  GOUT (ICD-274.9)  no exacerbation pt on chronic narcotics I don't want to prescribe narcotics will check ESR, may need pain clinic. I think he uses narcotics for "a sense of calm"  Orders: Venipuncture ([address]) Sedimentation Rate, non-automated ([address])  Complete Medication List: 1)  Simvastatin 40 Mg Tabs (Simvastatin) .... Take 1 tablet by mouth at bedtime 2)  Alprazolam 1 Mg Tabs (Alprazolam) .... Take 1 tablet by mouth at bedtime as needed --#20 to last a full month 3)  Indomethacin 50 Mg Caps (Indomethacin) .... Three times a day as needed 4)  Fexofenadine Hcl 60 Mg Tabs (Fexofenadine hcl) .Marland Kitchen.. 1-2 once daily as needed 5)  Acyclovir 200 Mg Caps (Acyclovir) .... Take 1 capsule by mouth four times a day x 10 days as needed 6)  Ranitidine Hcl 150 Mg Caps (Ranitidine hcl) .... One by mouth bid  Other Orders: TLB-Lipid Panel (80061-LIPID) TLB-Hepatic/Liver Function Pnl (80076-HEPATIC) TLB-TSH (Thyroid Stimulating Hormone) (84443-TSH)  Appended Document: HEARTBURN / GASTRIC PROBLEMS // RS/PT RSC/CJR  Laboratory Results   Blood Tests     SED rate: 18 mm/hr  Comments: Rita Ohara  May 14, 2009 2:41 PM      Appended Document: HEARTBURN / GASTRIC PROBLEMS // RS/PT RSC/CJR call patient. labs ok  refer to pain clinic  Appended Document: HEARTBURN / GASTRIC PROBLEMS // RS/PT RSC/CJR Left message on machine. Pt to call back.   Appended Document: HEARTBURN / GASTRIC PROBLEMS // RS/PT RSC/CJR Patient notified. Will await when and where of appt.  referral in process

## 2010-05-19 NOTE — Progress Notes (Signed)
Summary: request  Phone Note Call from Patient Call back at Home Phone [phone]   Caller: Patient Call For: Birdie Sons MD Summary of Call: appt with pain clinic not until 4/19.  Asking if can have another refill of hydrocodone-apap.  [facility] Initial call taken by: Gladis Riffle, RN,  June 17, 2009 1:58 PM  Follow-up for Phone Call        ok to refill Follow-up by: Birdie Sons MD,  June 17, 2009 3:46 PM  Additional Follow-up for Phone Call Additional follow up Details #1::        see Rx.  Patient notified.  Additional Follow-up by: Gladis Riffle, RN,  June 18, 2009 8:12 AM    Prescriptions: HYDROCODONE-ACETAMINOPHEN 5-325 MG TABS (HYDROCODONE-ACETAMINOPHEN) Take 1 tablet by mouth once a day as needed pain  #20 x 0   Entered by:   Gladis Riffle, RN   Authorized by:   Birdie Sons MD   Signed by:   Gladis Riffle, RN on 06/18/2009   Method used:   Telephoned to ...       [facility] (retail)       [address]       [place], [place]  [address]       Ph: [phone]       Fax: [phone]   RxID:   [id number]

## 2010-05-19 NOTE — Progress Notes (Signed)
Summary: Orders for Outpatient Procedures and Lab Work  Orders for Outpatient Procedures and Lab Work   Imported By: Maryln Gottron 03/22/2010 10:11:52  _____________________________________________________________________  External Attachment:    Type:   Image     Comment:   External Document

## 2010-05-19 NOTE — Assessment & Plan Note (Signed)
Summary: 1 wk rov/njr   Vital Signs:  Patient profile:   65 year old male Weight:      192 pounds Temp:     98.7 degrees F oral Pulse rate:   80 / minute Pulse rhythm:   regular BP sitting:   102 / 74  (left arm) Cuff size:   regular  Vitals Entered By: Alfred Levins, CMA (March 25, 2010 11:50 AM) CC: f/u   CC:  f/u.  History of Present Illness:  Follow-Up Visit      This is a 65 year old man who presents for Follow-up visit.  The patient denies chest pain, palpitations, and SOB.  Since the last visit the patient notes no new problems or concerns.  The patient reports taking meds as prescribed.  When questioned about possible medication side effects, the patient notes none.   he is feeling better. see results from thoracentesis  Current Medications (verified): 1)  Simvastatin 40 Mg Tabs (Simvastatin) .... Take 1 Tablet By Mouth At Bedtime 2)  Alprazolam 1 Mg Tabs (Alprazolam) .... Take 1 Tablet By Mouth At Bedtime As Needed --#20 To Last A Full Month 3)  Zyrtec Allergy 10 Mg  Tabs (Cetirizine Hcl) .Marland Kitchen.. 1 Once Daily Prn 4)  Omeprazole 20 Mg Cpdr (Omeprazole) .... One By Mouth Daily 5)  Folic Acid 1 Mg Tabs (Folic Acid) .... Take 1 Tab By Mouth Daily 6)  Vitamin B-1 100 Mg Tabs (Thiamine Hcl) .Marland Kitchen.. 1 By Mouth Once Daily 7)  Hydrocodone-Acetaminophen 5-325 Mg Tabs (Hydrocodone-Acetaminophen) .Marland Kitchen.. 1 By Mouth Three Times A Day As Needed  Allergies (verified): 1)  ! Lipitor (Atorvastatin Calcium)  Past History:  Past Medical History: Last updated: 12/11/2006 Allergic rhinitis Gout Hyperlipidemia alcohol dependancy--elevated LFT's  Past Surgical History: Last updated: 02/12/2009 Appendectomy  age 65 Inguinal herniorrhaphy  2006 Tonsillectomy, adenoidectomy  age 65 THA 08/28/08  Family History: Last updated: 02/24/07 father deceased in his 9's unknown cause mother cerebral hemorrhage-65  Social History: Last updated: 03-04-08 wife deceased Alcohol  use-yes---binge drinks for weeks at a time and then stops Regular exercise-no quit smoking at age 65 has 2 grandchildren that live with him (father of 2 grandkids intermittently lives with him) son with MS lives with him  Risk Factors: Exercise: no (Feb 24, 2007)  Risk Factors: Smoking Status: quit > 6 months (05/14/2009)  Physical Exam  General:  chronically ill-appearing male in no acute distress. HEENT exam atraumatic, normocephalic symmetric her muscles are intact. Conjunctivae are pink. Chest clear to auscultation some decreased breath sounds at the right base. No wheezing. Cardiac exam S1-S2 are regular. Abdominal exam overweight, active BS, soft, extremities no clubbing cyanosis or edema.   Impression & Recommendations:  Problem # 1:  PNEUMONIA, COMMUNITY ACQUIRED, (ICD-481)  clinically improving  Orders: T-2 View CXR (71020TC)  His updated medication list for this problem includes:    Levaquin 500 Mg Tabs (Levofloxacin) .Marland Kitchen... Take 1 tab by mouth daily  Problem # 2:  UNSPECIFIED ANEMIA (ICD-285.9) dramatically improved.  His updated medication list for this problem includes:    Folic Acid 1 Mg Tabs (Folic acid) .Marland Kitchen... Take 1 tab by mouth daily  Hgb: 11.4 (03/15/2010)   Hct: 33.4 (03/15/2010)   Platelets: 429.0 (03/15/2010) RBC: 3.39 (03/15/2010)   RDW: 14.8 (03/15/2010)   WBC: 12.9 (03/15/2010) MCV: 98.5 (03/15/2010)   MCHC: 34.0 (03/15/2010) Ferritin: 1167.3 (03/15/2010) B12: 932 (03/15/2010)   Folate: >20.0 ng/mL (03/15/2010)   TSH: 1.19 (03/15/2010)  Complete Medication List: 1)  Simvastatin 40 Mg Tabs (Simvastatin) .... Take 1 tablet by mouth at bedtime 2)  Alprazolam 1 Mg Tabs (Alprazolam) .... Take 1 tablet by mouth at bedtime as needed --#20 to last a full month 3)  Zyrtec Allergy 10 Mg Tabs (Cetirizine hcl) .Marland Kitchen.. 1 once daily prn 4)  Omeprazole 20 Mg Cpdr (Omeprazole) .... One by mouth daily 5)  Folic Acid 1 Mg Tabs (Folic acid) .... Take 1 tab by mouth  daily 6)  Vitamin B-1 100 Mg Tabs (Thiamine hcl) .Marland Kitchen.. 1 by mouth once daily 7)  Hydrocodone-acetaminophen 5-325 Mg Tabs (Hydrocodone-acetaminophen) .Marland Kitchen.. 1 by mouth three times a day as needed 8)  Levaquin 500 Mg Tabs (Levofloxacin) .... Take 1 tab by mouth daily Prescriptions: HYDROCODONE-ACETAMINOPHEN 5-325 MG TABS (HYDROCODONE-ACETAMINOPHEN) 1 by mouth three times a day as needed  #20 x 0   Entered and Authorized by:   Birdie Sons MD   Signed by:   Birdie Sons MD on 03/25/2010   Method used:   Print then Give to Patient   RxID:   [id number]    Orders Added: 1)  T-2 View CXR [71020TC] 2)  Est. Patient Level III [14782]

## 2010-05-19 NOTE — Assessment & Plan Note (Signed)
Summary: discuss labs/cdw   Vital Signs:  Patient profile:   65 year old male Weight:      198 pounds BMI:     33.07 O2 Sat:      86 % on Room air Temp:     99.2 degrees F oral Pulse rate:   58 / minute Pulse rhythm:   regular BP sitting:   102 / 80  (left arm) Cuff size:   large  Vitals Entered By: Alfred Levins, CMA (March 18, 2010 11:44 AM)  O2 Flow:  Room air CC: f/u on labs   CC:  f/u on labs.  History of Present Illness:  Follow-Up Visit      This is a 65 year old man who presents for Follow-up visit.  The patient complains of SOB, but denies chest pain and palpitations.  Since the last visit the patient notes a recent hospitilization.  The patient reports taking meds as prescribed.  When questioned about possible medication side effects, the patient notes none.    Reviewed hopsital records fatigue, SOB, decreased appetite others no complaints  Current Medications (verified): 1)  Simvastatin 40 Mg Tabs (Simvastatin) .... Take 1 Tablet By Mouth At Bedtime 2)  Alprazolam 1 Mg Tabs (Alprazolam) .... Take 1 Tablet By Mouth At Bedtime As Needed --#20 To Last A Full Month 3)  Zyrtec Allergy 10 Mg  Tabs (Cetirizine Hcl) .Marland Kitchen.. 1 Once Daily Prn 4)  Omeprazole 20 Mg Cpdr (Omeprazole) .... One By Mouth Daily 5)  Folic Acid 1 Mg Tabs (Folic Acid) .... Take 1 Tab By Mouth Daily 6)  Vitamin B-1 100 Mg Tabs (Thiamine Hcl) .Marland Kitchen.. 1 By Mouth Once Daily 7)  Hydrocodone-Acetaminophen 5-325 Mg Tabs (Hydrocodone-Acetaminophen) .Marland Kitchen.. 1 By Mouth Three Times A Day As Needed  Allergies (verified): 1)  ! Lipitor (Atorvastatin Calcium)  Past History:  Past Medical History: Last updated: 12/11/2006 Allergic rhinitis Gout Hyperlipidemia alcohol dependancy--elevated LFT's  Past Surgical History: Last updated: 02/12/2009 Appendectomy  age 65 Inguinal herniorrhaphy  2006 Tonsillectomy, adenoidectomy  age 65 THA 08/28/08  Family History: Last updated: 02/12/2007 father deceased in his  54's unknown cause mother cerebral hemorrhage-65  Social History: Last updated: 02/21/08 wife deceased Alcohol use-yes---binge drinks for weeks at a time and then stops Regular exercise-no quit smoking at age 6 has 2 grandchildren that live with him (father of 2 grandkids intermittently lives with him) son with MS lives with him  Risk Factors: Exercise: no (02-12-07)  Risk Factors: Smoking Status: quit > 6 months (05/14/2009)  Physical Exam  General:  ill-appearing male in no acute distress. HEENT exam atraumatic normocephalic symmetric her muscles are intact. Neck is supple without lymphadenopathy or thyromegaly. Decreased breath sounds on the right base. Dullness to percussion midway at the right lung field. Neck supple. cardiac exam S1-S2 are regular. She resampling cyanosis or edema. Skin without jaundice. No icterus noted   Impression & Recommendations:  Problem # 1:  PNEUMONIA, COMMUNITY ACQUIRED, (ICD-481) clinically he feels much better. The easiest way to explain this whole scenario is that he fell with some rib fractures. He then developed atelectasis which set him up for pneumonia. He was treated with pneumonia. Clinically he is feeling much better. I'm not concerned with a significant pleural effusion. He is having some symptoms of that with shortness of breath. I think it's best to proceed with a therapeutic and diagnostic thoracentesis. Lab orders sent to radiology. I set up the thoracentesis while the patient in the office. I'll  see the patient back next week.  I have some concern with elevated sedimentation rate. I'm hopeful that this is all inflammatory related to a parapneumonic effusion. We will need to follow closely given some concern of malignancy.  Problem # 2:  UNSPECIFIED ANEMIA (ICD-285.9) much improved compared to discharge hemoglobin level His updated medication list for this problem includes:    Folic Acid 1 Mg Tabs (Folic acid) .Marland Kitchen... Take 1 tab by  mouth daily  Complete Medication List: 1)  Simvastatin 40 Mg Tabs (Simvastatin) .... Take 1 tablet by mouth at bedtime 2)  Alprazolam 1 Mg Tabs (Alprazolam) .... Take 1 tablet by mouth at bedtime as needed --#20 to last a full month 3)  Zyrtec Allergy 10 Mg Tabs (Cetirizine hcl) .Marland Kitchen.. 1 once daily prn 4)  Omeprazole 20 Mg Cpdr (Omeprazole) .... One by mouth daily 5)  Folic Acid 1 Mg Tabs (Folic acid) .... Take 1 tab by mouth daily 6)  Vitamin B-1 100 Mg Tabs (Thiamine hcl) .Marland Kitchen.. 1 by mouth once daily 7)  Hydrocodone-acetaminophen 5-325 Mg Tabs (Hydrocodone-acetaminophen) .Marland Kitchen.. 1 by mouth three times a day as needed  Patient Instructions: 1)  see me next week   Orders Added: 1)  Est. Patient Level IV [81191]

## 2010-05-19 NOTE — Assessment & Plan Note (Signed)
Summary: pt fell an cracked ribs/has pneumonia/cjr   Vital Signs:  Patient profile:   65 year old male Weight:      198 pounds BMI:     33.07 O2 Sat:      94 % on Room air Temp:     98.5 degrees F oral Pulse rate:   113 / minute BP sitting:   102 / 70  (left arm) Cuff size:   regular  Vitals Entered By: Alfred Levins, CMA (March 15, 2010 11:28 AM)  O2 Flow:  Room air  Serial Vital Signs/Assessments:  Time      Position  BP       Pulse  Resp  Temp     By                              98                    Birdie Sons MD  CC: sob, wheezing   CC:  sob and wheezing.  History of Present Illness: pneumonia--reviewed hospital notes-he is feeling some better. Still short of breath. Note rib fractures. This is thought to be the cause of his initial symptoms.  anemia-note significant decrease in hemoglobin while in the hospital chart  elevated LFTs--long history of alcohol abuse. Has had history of elevated liver function tests in the past.  Review of systems patient has some shortness of breath he has some pain associated with the rib fractures. His appetite has been okay. He sleeping all right. He does admit to general fatigue and a general sensation of not feeut no other specific complaints in a complete review of systems.  Current Medications (verified): 1)  Simvastatin 40 Mg Tabs (Simvastatin) .... Take 1 Tablet By Mouth At Bedtime 2)  Alprazolam 1 Mg Tabs (Alprazolam) .... Take 1 Tablet By Mouth At Bedtime As Needed --#20 To Last A Full Month 3)  Zyrtec Allergy 10 Mg  Tabs (Cetirizine Hcl) .Marland Kitchen.. 1 Once Daily Prn 4)  Omeprazole 20 Mg Cpdr (Omeprazole) .... One By Mouth Daily 5)  Folic Acid 1 Mg Tabs (Folic Acid) .... Take 1 Tab By Mouth Daily 6)  Vitamin B-1 100 Mg Tabs (Thiamine Hcl) .Marland Kitchen.. 1 By Mouth Once Daily  Allergies (verified): 1)  ! Lipitor (Atorvastatin Calcium)  Past History:  Past Medical History: Last updated: 12/11/2006 Allergic  rhinitis Gout Hyperlipidemia alcohol dependancy--elevated LFT's  Past Surgical History: Last updated: 02/12/2009 Appendectomy  age 17 Inguinal herniorrhaphy  2006 Tonsillectomy, adenoidectomy  age 67 THA 08/28/08  Family History: Last updated: 02/08/07 father deceased in his 81's unknown cause mother cerebral hemorrhage-65  Social History: Last updated: 2008-02-17 wife deceased Alcohol use-yes---binge drinks for weeks at a time and then stops Regular exercise-no quit smoking at age 54 has 2 grandchildren that live with him (father of 2 grandkids intermittently lives with him) son with MS lives with him  Risk Factors: Exercise: no (02/08/2007)  Risk Factors: Smoking Status: quit > 6 months (05/14/2009)  Physical Exam  General:  chronically ill-appearing male in no acute distress. HEENT exam atraumatic, normocephalic symmetric her muscles are intact. Conjunctivae are pink. Chest clear to auscultation some decreased breath sounds at the right base. No wheezing. Cardiac exam S1-S2 are regular. Abdominal exam overweight, to bowel sounds, soft her extremities no clubbing cyanosis or edema.   Impression & Recommendations:  Problem # 1:  PNEUMONIA, COMMUNITY ACQUIRED, (ICD-481)  completed  avelox he has continued fatigue and weakness reviewed hospital labs and d/c summary  Orders: Venipuncture (16109) Specimen Handling (60454) TLB-CBC Platelet - w/Differential (85025-CBCD) TLB-B12 + Folate Pnl (09811_91478-G95/AOZ) TLB-Ferritin (82728-FER) TLB-Sedimentation Rate (ESR) (85652-ESR) TLB-BMP (Basic Metabolic Panel-BMET) (80048-METABOL) TLB-Hepatic/Liver Function Pnl (80076-HEPATIC) TLB-TSH (Thyroid Stimulating Hormone) (84443-TSH)  Problem # 2:  UNSPECIFIED ANEMIA (ICD-285.9)  reviewed hospital records (hgb from 12.3-8.8).  very possible that his current symptoms are related to anemia. We'll check blood count. His updated medication list for this problem includes:     Folic Acid 1 Mg Tabs (Folic acid) .Marland Kitchen... Take 1 tab by mouth daily  Orders: Venipuncture (30865) Specimen Handling (78469) TLB-CBC Platelet - w/Differential (85025-CBCD) TLB-B12 + Folate Pnl (62952_84132-G40/NUU) TLB-Ferritin (82728-FER) TLB-Sedimentation Rate (ESR) (85652-ESR) TLB-BMP (Basic Metabolic Panel-BMET) (80048-METABOL) TLB-Hepatic/Liver Function Pnl (80076-HEPATIC) TLB-TSH (Thyroid Stimulating Hormone) (84443-TSH)  Problem # 3:  LIVER FUNCTION TESTS, ABNORMAL (ICD-794.8) ongoing problem. I suspect related to alcohol use. Note alcohol level in the hospital. reviewed laboratories. Hemoglobin really is okay. I will repeat chest x-ray to document clearing of pneumonia.  Complete Medication List: 1)  Simvastatin 40 Mg Tabs (Simvastatin) .... Take 1 tablet by mouth at bedtime 2)  Alprazolam 1 Mg Tabs (Alprazolam) .... Take 1 tablet by mouth at bedtime as needed --#20 to last a full month 3)  Zyrtec Allergy 10 Mg Tabs (Cetirizine hcl) .Marland Kitchen.. 1 once daily prn 4)  Omeprazole 20 Mg Cpdr (Omeprazole) .... One by mouth daily 5)  Folic Acid 1 Mg Tabs (Folic acid) .... Take 1 tab by mouth daily 6)  Vitamin B-1 100 Mg Tabs (Thiamine hcl) .Marland Kitchen.. 1 by mouth once daily 7)  Hydrocodone-acetaminophen 5-325 Mg Tabs (Hydrocodone-acetaminophen) .Marland Kitchen.. 1 by mouth three times a day as needed   Orders Added: 1)  Venipuncture [36415] 2)  Specimen Handling [99000] 3)  TLB-CBC Platelet - w/Differential [85025-CBCD] 4)  TLB-B12 + Folate Pnl [82746_82607-B12/FOL] 5)  TLB-Ferritin [82728-FER] 6)  TLB-Sedimentation Rate (ESR) [85652-ESR] 7)  TLB-BMP (Basic Metabolic Panel-BMET) [80048-METABOL] 8)  TLB-Hepatic/Liver Function Pnl [80076-HEPATIC] 9)  TLB-TSH (Thyroid Stimulating Hormone) [84443-TSH] 10)  Est. Patient Level IV [72536]

## 2010-05-19 NOTE — Progress Notes (Signed)
Summary: appt with Dr. Edwyna Shell 04-14-10  Phone Note Call from Patient Call back at Home Phone [phone]   Caller: Patient Call For: [facility] Summary of Call: Patient phoned he was seen last week and received a message on Friday regarding an appointment with Dr. Cyndra Numbers the message said for him to call us back but he accidently erased the message and doesn't know who called him. He can be reached [phone] Initial call taken by: Vedia Coffer,  April 12, 2010 1:56 PM  Follow-up for Phone Call        pt was advsied of appt with Dr. Edwyna Shell by [facility].Carron Curie CMA  April 12, 2010 3:35 PM

## 2010-05-19 NOTE — Progress Notes (Signed)
Summary: heartburn  Phone Note Call from Patient Call back at Home Phone [phone]   Caller: vm Summary of Call: Zantac not doing any good any more for heartburn.  Can he give something else? Initial call taken by: Rudy Jew, RN,  Sep 14, 2009 5:06 PM  Follow-up for Phone Call        refer to GI for endoscopy in the meantime---see med list Follow-up by: Birdie Sons MD,  September 15, 2009 9:03 AM    New/Updated Medications: OMEPRAZOLE 20 MG CPDR (OMEPRAZOLE) one by mouth daily Prescriptions: OMEPRAZOLE 20 MG CPDR (OMEPRAZOLE) one by mouth daily  #90 x 3   Entered and Authorized by:   Birdie Sons MD   Signed by:   Birdie Sons MD on 09/15/2009   Method used:   Electronically to        [facility] (retail)       [address]       [place], [place]  [address]       Ph: [phone]       Fax: [phone]   RxID:   [id number]  Pt. notified.

## 2010-05-19 NOTE — Progress Notes (Signed)
Summary: Pt req dates of cpx from 2002 to 2004  Phone Note Call from Patient Call back at [facility] Phone [phone]   Caller: Patient Summary of Call: Pt called and is req dates for cpx from 2002, 2003 and 2004. Pt is needing this info for insurance purposes.  Initial call taken by: Lucy Antigua,  June 11, 2009 11:12 AM  Follow-up for Phone Call        I lft vm for pt to call back re: cpx dates he requested for 2002 to 2004.  I pulled pts chart and the cpx dates are  06/25/00 ,  07/05/01,  and 09/05/02, per Fleet Contras.  Waiting on call back from pt.  Follow-up by: Lucy Antigua,  June 11, 2009 2:30 PM

## 2010-05-19 NOTE — Assessment & Plan Note (Signed)
Summary: Pulmonary/ new pt eval for hemothorax   Visit Type:  Initial Consult Copy to:  Dr. Birdie Sons Primary Provider/Referring Provider:  Dr. Birdie Sons  CC:  DOE.  History of Present Illness: 65 yowm minimal smoking hx with new bloody R effusion referred for eval to Pulmonary clinic  April 08, 2010  1st pulmonary office eval cc cp since fell out of bed and hit chest of drawers on Mar 18 2010 ? related to 4 beers > large bruise over R flank > [facility] er dx "pna" rx with abx then saw swords then got tapped x 1.4 liters on Dec 2 then CT 12/21 showing fx ribs and  loculated r effusion with thickened borders and septations.    Still hurts with deep breath. minimal  sob.  Pt denies any significant sore throat, dysphagia, itching, sneezing,  nasal congestion or excess secretions,  fever, chills, sweats, unintended wt loss, pleuritic or exertional cp, hempoptysis, change in activity tolerance  orthopnea pnd or leg swelling Pt also denies any obvious fluctuation in symptoms with weather or environmental change or other alleviating or aggravating factors.       Current Medications (verified): 1)  Simvastatin 40 Mg Tabs (Simvastatin) .... Take 1 Tablet By Mouth At Bedtime 2)  Alprazolam 1 Mg Tabs (Alprazolam) .... Take 1 Tablet By Mouth At Bedtime As Needed --#20 To Last A Full Month 3)  Zyrtec Allergy 10 Mg  Tabs (Cetirizine Hcl) .Marland Kitchen.. 1 Once Daily As Needed 4)  Omeprazole 20 Mg Cpdr (Omeprazole) .... One By Mouth Daily 5)  Folic Acid 1 Mg Tabs (Folic Acid) .... Take 1 Tab By Mouth Daily 6)  Vitamin B-1 100 Mg Tabs (Thiamine Hcl) .Marland Kitchen.. 1 By Mouth Once Daily 7)  Hydrocodone-Acetaminophen 5-325 Mg Tabs (Hydrocodone-Acetaminophen) .Marland Kitchen.. 1 By Mouth Three Times A Day As Needed 8)  Levaquin 500 Mg Tabs (Levofloxacin) .... Take 1 Tab By Mouth Daily  Allergies (verified): 1)  ! Lipitor (Atorvastatin Calcium)  Past History:  Past Medical History: Allergic rhinitis Gout Hyperlipidemia alcohol  dependancy--elevated LFT's R Hemothorax     - Thoracentesis 03/18/10 1.4 liters bloody     - CT chest 04/06/10 loculated with septations c/w organized hemothorax  Past Surgical History: Appendectomy  age 65 Inguinal herniorrhaphy  2006 Tonsillectomy, adenoidectomy  age 65 THA 08/28/08 Left hip replaced 2010  Family History: father deceased in his 34's unknown cause mother cerebral hemorrhage-65 RA- Mother  Social History: wife deceased Alcohol use-yes---binge drinks for weeks at a time and then stops Regular exercise-no quit smoking at age 38. Smoked approx 65 yrs up to 1 ppd has 2 grandchildren that live with him (father of 2 grandkids intermittently lives with him) son with MS lives with him Self employed  Review of Systems       The patient complains of shortness of breath with activity, non-productive cough, chest pain, and loss of appetite.  The patient denies shortness of breath at rest, productive cough, coughing up blood, irregular heartbeats, acid heartburn, indigestion, weight change, abdominal pain, difficulty swallowing, sore throat, tooth/dental problems, headaches, nasal congestion/difficulty breathing through nose, sneezing, itching, ear ache, anxiety, depression, hand/feet swelling, joint stiffness or pain, rash, change in color of mucus, and fever.    Vital Signs:  Patient profile:   65 year old male Weight:      188 pounds BMI:     31.40 O2 Sat:      97 % on Room air Temp:     97.5 degrees  F oral Pulse rate:   124 / minute BP sitting:   118 / 80  (left arm)  Vitals Entered By: Vernie Murders (April 08, 2010 11:31 AM)  O2 Flow:  Room air  Physical Exam  Additional Exam:  wt 190 > 188 April 08, 2010 HEENT: nl dentition, turbinates, and orophanx. Nl external ear canals without cough reflex NECK :  without JVD/Nodes/TM/ nl carotid upstrokes bilaterally Chest:  no ecchymoses LUNGS: no acc muscle use, decreased bs with dullness one half down on R  CV:   RRR  no s3 or murmur or increase in P2, no edema  ABD:  soft and nontender with nl excursion in the supine position. 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 related to etohism  Discussed in detail all the  indications, usual  risks and alternatives  relative to the benefits with patient who agrees to proceed with t surgery eval.  Medications Added to Medication List This Visit: 1)  Zyrtec Allergy 10 Mg Tabs (Cetirizine hcl) .Marland Kitchen.. 1 once daily as needed  Other Orders: Misc. Referral (Misc. Ref) New Patient Level V [phone])  Patient Instructions: 1)  You a have a trautic hemothorax from rib fractures which amts to a collection of clotted blood in your chest that may cause quite a bit scarring if not corrected and prevent your lung from expanding properly.  2)  See Patient Care Coordinator before leaving for thoracic surgery evaluation - if condition worsens go to ER

## 2010-05-19 NOTE — Assessment & Plan Note (Signed)
Summary: fall 2-3 days ago/dm   Vital Signs:  Patient profile:   65 year old male Weight:      200 pounds Temp:     97.0 degrees F oral BP sitting:   120 / 80  (left arm) Cuff size:   large  Vitals Entered By: Sid Falcon LPN (February 17, 2010 2:29 PM) CC: Pt fell two days ago, sore right side Is Patient Diabetic? No   History of Present Illness: Patient seen acute injury. Fell Tuesday night tripped over a throw rug and fell over coffee table. Bruising and pain right lower rib cage area. Pain is moderate to severe at times. No relief with over-the-counter medications. No cough, fever, or chills.  No hemoptysis.  Some pain with deep breathing.  Allergies: 1)  ! Lipitor (Atorvastatin Calcium)  Past History:  Past Medical History: Last updated: 12/11/2006 Allergic rhinitis Gout Hyperlipidemia alcohol dependancy--elevated LFT's  Review of Systems      See HPI  Physical Exam  General:  Well-developed,well-nourished,in no acute distress; alert,appropriate and cooperative throughout examination Lungs:  Normal respiratory effort, chest expands symmetrically. Lungs are clear to auscultation, no crackles or wheezes. Heart:  Normal rate and regular rhythm. S1 and S2 normal without gallop, murmur, click, rub or other extra sounds. Msk:  patient has area of ecchymosis which is approximately 12 of 14 cm right posterior rib cage area. There is tenderness from the eighth through 10th ribs posterior   Impression & Recommendations:  Problem # 1:  RIB PAIN (ICD-786.50) ?rib fracture.  Rib films and pain meds prescribed.  Pt encouraged to take some deep breaths to avoid atelectasis. Orders: T-Ribs Unilateral 2 Views (71100TC) Prescription Created Electronically [phone])  Complete Medication List: 1)  Simvastatin 40 Mg Tabs (Simvastatin) .... Take 1 tablet by mouth at bedtime 2)  Alprazolam 1 Mg Tabs (Alprazolam) .... Take 1 tablet by mouth at bedtime as needed --#20 to last a full  month 3)  Indomethacin 50 Mg Caps (Indomethacin) .... Three times a day as needed 4)  Fexofenadine Hcl 60 Mg Tabs (Fexofenadine hcl) .Marland Kitchen.. 1-2 once daily as needed 5)  Acyclovir 200 Mg Caps (Acyclovir) .... Take 1 capsule by mouth four times a day x 10 days as needed 6)  Hydrocodone-acetaminophen 5-325 Mg Tabs (Hydrocodone-acetaminophen) .... Take 1-2 tablets by mouth q4-6 hours as needed pain 7)  Omeprazole 20 Mg Cpdr (Omeprazole) .... One by mouth daily  Patient Instructions: 1)  Follow up promptly for any increased cough, fever, or shortness of breath Prescriptions: HYDROCODONE-ACETAMINOPHEN 5-325 MG TABS (HYDROCODONE-ACETAMINOPHEN) Take 1-2 tablets by mouth q4-6 hours as needed pain  #40 x 0   Entered and Authorized by:   Evelena Peat MD   Signed by:   Evelena Peat MD on 02/17/2010   Method used:   Print then Give to Patient   RxID:   [id number]    Orders Added: 1)  T-Ribs Unilateral 2 Views [71100TC] 2)  Est. Patient Level III [95621] 3)  Prescription Created Electronically [phone]

## 2010-05-23 ENCOUNTER — Other Ambulatory Visit: Payer: Self-pay | Admitting: Thoracic Surgery

## 2010-05-23 DIAGNOSIS — S271XXA Traumatic hemothorax, initial encounter: Secondary | ICD-10-CM

## 2010-05-24 ENCOUNTER — Ambulatory Visit: Payer: Medicare Other | Admitting: Thoracic Surgery

## 2010-05-31 ENCOUNTER — Other Ambulatory Visit: Payer: Self-pay | Admitting: Thoracic Surgery

## 2010-05-31 DIAGNOSIS — D381 Neoplasm of uncertain behavior of trachea, bronchus and lung: Secondary | ICD-10-CM

## 2010-06-01 ENCOUNTER — Ambulatory Visit (INDEPENDENT_AMBULATORY_CARE_PROVIDER_SITE_OTHER): Payer: Self-pay | Admitting: Thoracic Surgery

## 2010-06-01 ENCOUNTER — Ambulatory Visit
Admission: RE | Admit: 2010-06-01 | Discharge: 2010-06-01 | Disposition: A | Discharge status: Home / Self Care | Payer: Medicare Other | Source: Ambulatory Visit | Attending: Thoracic Surgery | Admitting: Thoracic Surgery

## 2010-06-01 DIAGNOSIS — D381 Neoplasm of uncertain behavior of trachea, bronchus and lung: Secondary | ICD-10-CM

## 2010-06-01 DIAGNOSIS — J8410 Pulmonary fibrosis, unspecified: Secondary | ICD-10-CM

## 2010-06-27 LAB — BLOOD GAS, ARTERIAL
Acid-base deficit: 1.3 mmol/L (ref 0.0–2.0)
Bicarbonate: 21.9 mEq/L (ref 20.0–24.0)
O2 Saturation: 96.3 %
TCO2: 22.9 mmol/L (ref 0–100)
pCO2 arterial: 30.8 mmHg — ABNORMAL LOW (ref 35.0–45.0)
pO2, Arterial: 78.2 mmHg — ABNORMAL LOW (ref 80.0–100.0)

## 2010-06-27 LAB — CBC
HCT: 37.4 % — ABNORMAL LOW (ref 39.0–52.0)
Hemoglobin: 11.8 g/dL — ABNORMAL LOW (ref 13.0–17.0)
MCH: 28.5 pg (ref 26.0–34.0)
MCV: 90.3 fL (ref 78.0–100.0)
RBC: 4.14 MIL/uL — ABNORMAL LOW (ref 4.22–5.81)
WBC: 9.1 10*3/uL (ref 4.0–10.5)

## 2010-06-27 LAB — CULTURE, RESPIRATORY W GRAM STAIN: Culture: NO GROWTH

## 2010-06-27 LAB — COMPREHENSIVE METABOLIC PANEL
Albumin: 3.8 g/dL (ref 3.5–5.2)
Alkaline Phosphatase: 47 U/L (ref 39–117)
BUN: 7 mg/dL (ref 6–23)
CO2: 20 mEq/L (ref 19–32)
Chloride: 109 mEq/L (ref 96–112)
Creatinine, Ser: 0.86 mg/dL (ref 0.4–1.5)
GFR calc non Af Amer: >60 mL/min (ref >60)
Potassium: 4.4 mEq/L (ref 3.5–5.1)
Total Bilirubin: 0.4 mg/dL (ref 0.3–1.2)

## 2010-06-27 LAB — APTT: aPTT: 26 seconds (ref 24–37)

## 2010-06-27 LAB — TYPE AND SCREEN
ABO/RH(D): A POS
Antibody Screen: NEGATIVE

## 2010-06-27 LAB — URINALYSIS, ROUTINE W REFLEX MICROSCOPIC
Glucose, UA: NEGATIVE mg/dL
Ketones, ur: NEGATIVE mg/dL
Nitrite: NEGATIVE
Protein, ur: NEGATIVE mg/dL
pH: 7 (ref 5.0–8.0)

## 2010-06-27 LAB — ABO/RH: ABO/RH(D): A POS

## 2010-06-27 LAB — PROTIME-INR: INR: 1.06 (ref 0.00–1.49)

## 2010-06-28 LAB — URINE MICROSCOPIC-ADD ON

## 2010-06-28 LAB — DIFFERENTIAL
Basophils Absolute: 0.1 10*3/uL (ref 0.0–0.1)
Basophils Relative: 1 % (ref 0–1)
Basophils Relative: 1 % (ref 0–1)
Eosinophils Absolute: 0.3 10*3/uL (ref 0.0–0.7)
Eosinophils Absolute: 0.4 10*3/uL (ref 0.0–0.7)
Eosinophils Relative: 3 % (ref 0–5)
Eosinophils Relative: 4 % (ref 0–5)
Monocytes Relative: 9 % (ref 3–12)
Neutrophils Relative %: 64 % (ref 43–77)
Neutrophils Relative %: 74 % (ref 43–77)

## 2010-06-28 LAB — CBC
HCT: 24.4 % — ABNORMAL LOW (ref 39.0–52.0)
Hemoglobin: 12.3 g/dL — ABNORMAL LOW (ref 13.0–17.0)
Hemoglobin: 8.6 g/dL — ABNORMAL LOW (ref 13.0–17.0)
MCH: 33.6 pg (ref 26.0–34.0)
MCH: 33.8 pg (ref 26.0–34.0)
MCHC: 34.7 g/dL (ref 30.0–36.0)
MCHC: 34.7 g/dL (ref 30.0–36.0)
RBC: 2.52 MIL/uL — ABNORMAL LOW (ref 4.22–5.81)
RDW: 13.9 % (ref 11.5–15.5)

## 2010-06-28 LAB — COMPREHENSIVE METABOLIC PANEL
ALT: 56 U/L — ABNORMAL HIGH (ref 0–53)
AST: 44 U/L — ABNORMAL HIGH (ref 0–37)
CO2: 23 mEq/L (ref 19–32)
Calcium: 8.4 mg/dL (ref 8.4–10.5)
Chloride: 112 mEq/L (ref 96–112)
GFR calc Af Amer: >60 mL/min (ref >60)
GFR calc non Af Amer: >60 mL/min (ref >60)
Glucose, Bld: 105 mg/dL — ABNORMAL HIGH (ref 70–99)
Sodium: 141 mEq/L (ref 135–145)
Total Bilirubin: 0.5 mg/dL (ref 0.3–1.2)

## 2010-06-28 LAB — POCT CARDIAC MARKERS: Troponin i, poc: <0.05 ng/mL (ref 0.00–0.09)

## 2010-06-28 LAB — CARDIAC PANEL(CRET KIN+CKTOT+MB+TROPI)
CK, MB: 1.6 ng/mL (ref 0.3–4.0)
CK, MB: 1.9 ng/mL (ref 0.3–4.0)
Total CK: 46 U/L (ref 7–232)
Total CK: 48 U/L (ref 7–232)
Total CK: 54 U/L (ref 7–232)
Troponin I: 0.1 ng/mL — ABNORMAL HIGH (ref 0.00–0.06)

## 2010-06-28 LAB — FUNGUS CULTURE W SMEAR: Fungal Smear: NONE SEEN

## 2010-06-28 LAB — URINALYSIS, ROUTINE W REFLEX MICROSCOPIC
Bilirubin Urine: NEGATIVE
Glucose, UA: NEGATIVE mg/dL
Specific Gravity, Urine: 1.025 (ref 1.005–1.030)
Urobilinogen, UA: 0.2 mg/dL (ref 0.0–1.0)
pH: 5.5 (ref 5.0–8.0)

## 2010-06-28 LAB — CULTURE, BLOOD (ROUTINE X 2): Culture: NO GROWTH

## 2010-06-28 LAB — BASIC METABOLIC PANEL
BUN: 15 mg/dL (ref 6–23)
CO2: 16 mEq/L — ABNORMAL LOW (ref 19–32)
Calcium: 8.9 mg/dL (ref 8.4–10.5)
Creatinine, Ser: 1.12 mg/dL (ref 0.4–1.5)
Glucose, Bld: 141 mg/dL — ABNORMAL HIGH (ref 70–99)
Sodium: 136 mEq/L (ref 135–145)

## 2010-06-28 LAB — PROTEIN, BODY FLUID: Total protein, fluid: 4.1 g/dL

## 2010-06-28 LAB — RAPID URINE DRUG SCREEN, HOSP PERFORMED
Barbiturates: NOT DETECTED
Benzodiazepines: POSITIVE — AB
Cocaine: NOT DETECTED
Opiates: POSITIVE — AB

## 2010-06-28 LAB — PROTIME-INR: INR: 1.14 (ref 0.00–1.49)

## 2010-06-28 LAB — PHOSPHORUS: Phosphorus: 2.6 mg/dL (ref 2.3–4.6)

## 2010-06-28 LAB — BODY FLUID CELL COUNT WITH DIFFERENTIAL
Eos, Fluid: 5 %
Monocyte-Macrophage-Serous Fluid: 11 % — ABNORMAL LOW (ref 50–90)

## 2010-06-28 LAB — GLUCOSE, SEROUS FLUID: Glucose, Fluid: <20 mg/dL

## 2010-06-28 LAB — BODY FLUID CULTURE

## 2010-06-28 LAB — MAGNESIUM: Magnesium: 2 mg/dL (ref 1.5–2.5)

## 2010-06-29 NOTE — Assessment & Plan Note (Addendum)
OFFICE VISIT  [name], [name] DOB:  20-Oct-1945                                        June 01, 2010 CHART #:  [id number]  Victor Diaz came today.  His blood pressure is 139/84, pulse 92, respirations 20, sats were 95%.  His lungs were clear to auscultation and percussion.  I plan to see him back again in 6 weeks with a chest x- ray.  I gave him a refill for Vicodin and told him to stop his amiodarone and his Lopressor.  I think he has already had has stopped his Lopressor.  He is doing well from my standpoint.  Ines Bloomer, M.D. Electronically Signed  DPB/MEDQ  D:  06/01/2010  T:  06/01/2010  Job:  [id number]

## 2010-07-04 ENCOUNTER — Other Ambulatory Visit: Payer: Self-pay | Admitting: *Deleted

## 2010-07-04 DIAGNOSIS — F419 Anxiety disorder, unspecified: Secondary | ICD-10-CM

## 2010-07-04 MED ORDER — ALPRAZOLAM 1 MG PO TABS: 1.0000 mg | ORAL_TABLET | Freq: Every evening | ORAL | Status: DC | PRN

## 2010-07-12 ENCOUNTER — Other Ambulatory Visit: Payer: Self-pay | Admitting: Thoracic Surgery

## 2010-07-12 DIAGNOSIS — D381 Neoplasm of uncertain behavior of trachea, bronchus and lung: Secondary | ICD-10-CM

## 2010-07-13 ENCOUNTER — Ambulatory Visit (INDEPENDENT_AMBULATORY_CARE_PROVIDER_SITE_OTHER): Payer: Self-pay | Admitting: Thoracic Surgery

## 2010-07-13 ENCOUNTER — Ambulatory Visit
Admission: RE | Admit: 2010-07-13 | Discharge: 2010-07-13 | Disposition: A | Discharge status: Home / Self Care | Payer: Medicare Other | Source: Ambulatory Visit | Attending: Thoracic Surgery | Admitting: Thoracic Surgery

## 2010-07-13 DIAGNOSIS — S271XXA Traumatic hemothorax, initial encounter: Secondary | ICD-10-CM

## 2010-07-13 DIAGNOSIS — D381 Neoplasm of uncertain behavior of trachea, bronchus and lung: Secondary | ICD-10-CM

## 2010-07-14 NOTE — Assessment & Plan Note (Signed)
OFFICE VISIT  Victor Diaz, Victor Diaz DOB:  [birth year]                                        July 13, 2010 CHART #:  [id number]  The patient returns today.  His chest x-ray is stable.  We gave him a refill for Vicodin #60.  His incisions are well healed.  His blood pressure 131/80, pulse 100, respirations 16, sats released in a full activity and we will see him again if he has any future problems.  Ines Bloomer, M.D. Electronically Signed  DPB/MEDQ  D:  07/13/2010  T:  07/14/2010  Job:  [id number]

## 2010-07-26 LAB — BASIC METABOLIC PANEL
BUN: 10 mg/dL (ref 6–23)
BUN: 4 mg/dL — ABNORMAL LOW (ref 6–23)
CO2: 23 mEq/L (ref 19–32)
CO2: 23 mEq/L (ref 19–32)
Calcium: 8.3 mg/dL — ABNORMAL LOW (ref 8.4–10.5)
Calcium: 8.6 mg/dL (ref 8.4–10.5)
Chloride: 108 mEq/L (ref 96–112)
Creatinine, Ser: 0.76 mg/dL (ref 0.4–1.5)
Creatinine, Ser: 0.77 mg/dL (ref 0.4–1.5)
Creatinine, Ser: 0.87 mg/dL (ref 0.4–1.5)
GFR calc Af Amer: >60 mL/min (ref >60)
GFR calc non Af Amer: >60 mL/min (ref >60)
GFR calc non Af Amer: >60 mL/min (ref >60)
Glucose, Bld: 105 mg/dL — ABNORMAL HIGH (ref 70–99)
Glucose, Bld: 130 mg/dL — ABNORMAL HIGH (ref 70–99)

## 2010-07-26 LAB — TYPE AND SCREEN
ABO/RH(D): A POS
Antibody Screen: NEGATIVE

## 2010-07-26 LAB — PROTIME-INR
INR: 1.4 (ref 0.00–1.49)
INR: 1.7 — ABNORMAL HIGH (ref 0.00–1.49)
INR: 2.2 — ABNORMAL HIGH (ref 0.00–1.49)
INR: 2.4 — ABNORMAL HIGH (ref 0.00–1.49)
INR: 3.5 — ABNORMAL HIGH (ref 0.00–1.49)
INR: 1.1 (ref 0.00–1.49)
Prothrombin Time: 17.6 seconds — ABNORMAL HIGH (ref 11.6–15.2)
Prothrombin Time: 20.6 seconds — ABNORMAL HIGH (ref 11.6–15.2)
Prothrombin Time: 38.4 seconds — ABNORMAL HIGH (ref 11.6–15.2)
Prothrombin Time: 14.9 seconds (ref 11.6–15.2)

## 2010-07-26 LAB — CBC
HCT: 33.7 % — ABNORMAL LOW (ref 39.0–52.0)
MCHC: 34.2 g/dL (ref 30.0–36.0)
MCHC: 34.5 g/dL (ref 30.0–36.0)
MCV: 95.8 fL (ref 78.0–100.0)
Platelets: 164 10*3/uL (ref 150–400)
Platelets: 163 10*3/uL (ref 150–400)
RBC: 3.51 MIL/uL — ABNORMAL LOW (ref 4.22–5.81)
RDW: 13.5 % (ref 11.5–15.5)
RDW: 13.2 % (ref 11.5–15.5)
WBC: 8.5 10*3/uL (ref 4.0–10.5)
WBC: 5.8 10*3/uL (ref 4.0–10.5)

## 2010-07-26 LAB — COMPREHENSIVE METABOLIC PANEL
AST: 61 U/L — ABNORMAL HIGH (ref 0–37)
Albumin: 3.8 g/dL (ref 3.5–5.2)
Calcium: 9.2 mg/dL (ref 8.4–10.5)
Chloride: 107 mEq/L (ref 96–112)
Creatinine, Ser: 1 mg/dL (ref 0.4–1.5)
GFR calc Af Amer: >60 mL/min (ref >60)
Total Bilirubin: 0.6 mg/dL (ref 0.3–1.2)

## 2010-07-26 LAB — URINALYSIS, ROUTINE W REFLEX MICROSCOPIC
Nitrite: NEGATIVE
Specific Gravity, Urine: 1.005 (ref 1.005–1.030)
pH: 5.5 (ref 5.0–8.0)

## 2010-07-26 LAB — APTT: aPTT: 26 seconds (ref 24–37)

## 2010-08-10 ENCOUNTER — Ambulatory Visit (INDEPENDENT_AMBULATORY_CARE_PROVIDER_SITE_OTHER): Payer: Medicare Other | Admitting: Family Medicine

## 2010-08-10 ENCOUNTER — Encounter: Payer: Self-pay | Admitting: Family Medicine

## 2010-08-10 VITALS — BP 120/80 | HR 92 | Temp 97.9°F | Ht 66.5 in | Wt 191.0 lb

## 2010-08-10 DIAGNOSIS — W57XXXA Bitten or stung by nonvenomous insect and other nonvenomous arthropods, initial encounter: Secondary | ICD-10-CM

## 2010-08-10 DIAGNOSIS — T148 Other injury of unspecified body region: Secondary | ICD-10-CM

## 2010-08-10 MED ORDER — DOXYCYCLINE HYCLATE 100 MG PO CAPS: 100.0000 mg | ORAL_CAPSULE | Freq: Two times a day (BID) | ORAL | Status: AC

## 2010-08-10 NOTE — Progress Notes (Signed)
  Subjective:    Patient ID: DEL OVERFELT, male    DOB: May 09, 1945, 65 y.o.   MRN: [id number]  HPI Here to check a tick bite that he has had one day. He feels fine, but is concerned about exposure to infections. He noticed the tick embedded on his lower abdomen and pulled it out with tweezers.    Review of Systems  Constitutional: Negative.   Respiratory: Negative.   Cardiovascular: Negative.   Gastrointestinal: Negative.   Musculoskeletal: Negative.   Skin: Positive for wound. Negative for rash.       Objective:   Physical Exam  Constitutional: He appears well-developed and well-nourished.  Skin:       Single red macular bite mark on the right lower abdomen           Assessment & Plan:  Cover with Doxycycline. Recheck prn

## 2010-08-17 ENCOUNTER — Encounter: Payer: Medicare Other | Admitting: Internal Medicine

## 2010-08-18 ENCOUNTER — Encounter: Payer: Self-pay | Admitting: Internal Medicine

## 2010-08-18 ENCOUNTER — Ambulatory Visit (INDEPENDENT_AMBULATORY_CARE_PROVIDER_SITE_OTHER): Payer: Medicare Other | Admitting: Internal Medicine

## 2010-08-18 DIAGNOSIS — N320 Bladder-neck obstruction: Secondary | ICD-10-CM

## 2010-08-18 DIAGNOSIS — J90 Pleural effusion, not elsewhere classified: Secondary | ICD-10-CM

## 2010-08-18 DIAGNOSIS — Z2911 Encounter for prophylactic immunotherapy for respiratory syncytial virus (RSV): Secondary | ICD-10-CM

## 2010-08-18 DIAGNOSIS — F1020 Alcohol dependence, uncomplicated: Secondary | ICD-10-CM

## 2010-08-18 DIAGNOSIS — D649 Anemia, unspecified: Secondary | ICD-10-CM

## 2010-08-18 DIAGNOSIS — Z23 Encounter for immunization: Secondary | ICD-10-CM

## 2010-08-18 DIAGNOSIS — Z0000 Encounter for general adult medical examination without abnormal findings: Secondary | ICD-10-CM

## 2010-08-18 DIAGNOSIS — R945 Abnormal results of liver function studies: Secondary | ICD-10-CM

## 2010-08-18 DIAGNOSIS — Z125 Encounter for screening for malignant neoplasm of prostate: Secondary | ICD-10-CM

## 2010-08-18 DIAGNOSIS — E785 Hyperlipidemia, unspecified: Secondary | ICD-10-CM

## 2010-08-18 DIAGNOSIS — M109 Gout, unspecified: Secondary | ICD-10-CM

## 2010-08-18 DIAGNOSIS — N400 Enlarged prostate without lower urinary tract symptoms: Secondary | ICD-10-CM

## 2010-08-18 HISTORY — DX: Bladder-neck obstruction: N32.0

## 2010-08-18 LAB — HEPATIC FUNCTION PANEL
AST: 39 U/L — ABNORMAL HIGH (ref 0–37)
Albumin: 4.1 g/dL (ref 3.5–5.2)
Alkaline Phosphatase: 42 U/L (ref 39–117)
Bilirubin, Direct: 0 mg/dL (ref 0.0–0.3)
Total Protein: 7.2 g/dL (ref 6.0–8.3)

## 2010-08-18 LAB — CBC WITH DIFFERENTIAL/PLATELET
Basophils Relative: 0.6 % (ref 0.0–3.0)
Eosinophils Absolute: 0.4 10*3/uL (ref 0.0–0.7)
Eosinophils Relative: 6.2 % — ABNORMAL HIGH (ref 0.0–5.0)
HCT: 40.9 % (ref 39.0–52.0)
Lymphs Abs: 2.2 10*3/uL (ref 0.7–4.0)
MCHC: 34.7 g/dL (ref 30.0–36.0)
MCV: 95.2 fl (ref 78.0–100.0)
Monocytes Absolute: 0.7 10*3/uL (ref 0.1–1.0)
Neutro Abs: 2.8 10*3/uL (ref 1.4–7.7)
Neutrophils Relative %: 45.6 % (ref 43.0–77.0)
RBC: 4.29 Mil/uL (ref 4.22–5.81)
WBC: 6.2 10*3/uL (ref 4.5–10.5)

## 2010-08-18 LAB — POCT URINALYSIS DIPSTICK
Blood, UA: NEGATIVE
Glucose, UA: NEGATIVE
Ketones, UA: NEGATIVE
Spec Grav, UA: 1.015
Urobilinogen, UA: 0.2

## 2010-08-18 LAB — BASIC METABOLIC PANEL
BUN: 16 mg/dL (ref 6–23)
Calcium: 9.4 mg/dL (ref 8.4–10.5)
Creatinine, Ser: 0.9 mg/dL (ref 0.4–1.5)

## 2010-08-18 LAB — LIPID PANEL
Cholesterol: 156 mg/dL (ref 0–200)
Total CHOL/HDL Ratio: 4
Triglycerides: 102 mg/dL (ref 0.0–149.0)

## 2010-08-28 NOTE — Progress Notes (Signed)
Subjective:    Victor Diaz is a 65 y.o. male who presents for Medicare Initial preventive examination.   Preventive Screening-Counseling & Management  Tobacco History  Smoking status  . Former Smoker -- 1.0 packs/day for 10 years  . Quit date: 08/09/1969  Smokeless tobacco  . Never Used    Problems Prior to Visit 1.   Current Problems (verified) Patient Active Problem List  Diagnoses  . HYPERLIPIDEMIA  . GOUT  . UNSPECIFIED ANEMIA  . DEPENDENCE, ALCOHOL NEC/NOS, UNSPECIFIED  . ALLERGIC RHINITIS  . GERD  . LIVER FUNCTION TESTS, ABNORMAL  . PLEURAL EFFUSION, RIGHT  . Bladder neck obstruction    Medications Prior to Visit Current Outpatient Prescriptions on File Prior to Visit  Medication Sig Dispense Refill  . ALPRAZolam (XANAX) 1 MG tablet Take 1 tablet (1 mg total) by mouth at bedtime as needed for sleep.  20 tablet  2  . cetirizine (ZYRTEC) 10 MG tablet Take 10 mg by mouth daily.        Marland Kitchen omeprazole (PRILOSEC) 20 MG capsule Take 20 mg by mouth daily.        . simvastatin (ZOCOR) 40 MG tablet Take 40 mg by mouth at bedtime.          Current Medications (verified) Current Outpatient Prescriptions  Medication Sig Dispense Refill  . ALPRAZolam (XANAX) 1 MG tablet Take 1 tablet (1 mg total) by mouth at bedtime as needed for sleep.  20 tablet  2  . cetirizine (ZYRTEC) 10 MG tablet Take 10 mg by mouth daily.        Marland Kitchen omeprazole (PRILOSEC) 20 MG capsule Take 20 mg by mouth daily.        . simvastatin (ZOCOR) 40 MG tablet Take 40 mg by mouth at bedtime.           Allergies (verified) Atorvastatin   PAST HISTORY  Family History Family History  Problem Relation Age of Onset  . Arthritis Mother     Social History History  Substance Use Topics  . Smoking status: Former Smoker -- 1.0 packs/day for 10 years    Quit date: 08/09/1969  . Smokeless tobacco: Never Used  . Alcohol Use: Yes    Are there smokers in your home (other than you)?  No  Risk  Factors Current exercise habits: The patient does not participate in regular exercise at present.  Dietary issues discussed: does not follow a low fat diet---binge alcohol use   Cardiac risk factors: advanced age (older than 38 for men, 26 for women).  Depression Screen (Note: if answer to either of the following is "Yes", a more complete depression screening is indicated)   Q1: Over the past two weeks, have you felt down, depressed or hopeless? No  Q2: Over the past two weeks, have you felt little interest or pleasure in doing things? No  Have you lost interest or pleasure in daily life? No Activities of Daily Living In your present state of health, do you have any difficulty performing the following activities?:  Driving? No Managing money?  No Feeding yourself? No Getting from bed to chair? No Climbing a flight of stairs? No Preparing food and eating?: No  Are you sexually active?  No   Hearing Difficulties: No   Do you feel that you have a problem with memory? No Cognitive Testing  Alert? Yes  Normal Appearance?Yes  Oriented to person? Yes     Immunization History  Administered Date(s) Administered  . Influenza  Whole 02/12/2009  . Pneumococcal Polysaccharide 02/12/2009  . Td 12/30/1998, 02/12/2009  . Zoster 08/18/2010    Screening Tests Health Maintenance  Topic Date Due  . Colonoscopy  11/27/1993  . Influenza Vaccine  01/16/2011  . Tetanus/tdap  02/13/2019  . Pneumococcal Polysaccharide Vaccine Age 50 And Over  Completed  . Zostavax  Completed    All answers were reviewed with the patient and necessary referrals were made:  Judie Petit, MD   08/28/2010   History reviewed: allergies, current medications, past family history, past medical history, past social history, past surgical history and problem list  Review of Systems  patient denies chest pain, shortness of breath, orthopnea. Denies lower extremity edema, abdominal pain, change in appetite, change  in bowel movements. Patient denies rashes, musculoskeletal complaints. No other specific complaints in a complete review of systems.    Objective:    Blood pressure 114/78, pulse 84, temperature 98.6 F (37 C), temperature source Oral, height 5' 5.5" (1.664 m), weight 191 lb (86.637 kg). Body mass index is 31.30 kg/(m^2). Well-developed male in no acute distress. HEENT exam atraumatic, normocephalic, extraocular muscles are intact. Conjunctivae are pink without exudate. Neck is supple without lymphadenopathy, thyromegaly, jugular venous distention. Chest is clear to auscultation without increased work of breathing. Cardiac exam S1-S2 are regular. The PMI is normal. No significant murmurs or gallops. Abdominal exam active bowel sounds, soft, nontender. No abdominal bruits. Extremities no clubbing cyanosis or edema. Peripheral pulses are normal without bruits. Neurologic exam alert and oriented without any motor or sensory deficits. Assessment:     Well visit     Plan:     During the course of the visit the patient was educated and counseled about appropriate screening and preventive services including:    Pneumococcal vaccine   Td vaccine  Colorectal cancer screening  Advised no alcohol use  Patient Instructions (the written plan) was given to the patient.  Medicare Attestation I have personally reviewed: The patient's medical and social history Their current medications and supplements The patient's functional ability including ADLs,fall risks, home safety risks, cognitive, and hearing and visual impairment Diet and physical activities Evidence for depression or mood disorders  The patient's weight, height, BMI, and visual acuity have been recorded in the chart.  I have made referrals, counseling, and provided education to the patient based on review of the above and I have provided the patient with a written personalized care plan for preventive services.     Judie Petit, MD   08/28/2010

## 2010-08-28 NOTE — Assessment & Plan Note (Signed)
Advised absolute alcohol abstinence

## 2010-08-28 NOTE — Assessment & Plan Note (Signed)
No recurrence. 

## 2010-08-28 NOTE — Assessment & Plan Note (Signed)
No known recurrence 

## 2010-08-30 NOTE — Op Note (Signed)
NAME:  Victor Diaz, Victor Diaz              ACCOUNT NO.:  [id number]   MEDICAL RECORD NO.:  [id number]          PATIENT TYPE:  INP   LOCATION:  0005                         FACILITY:  [facility]   PHYSICIAN:  Ollen Gross, M.D.    DATE OF BIRTH:  07-Aug-1945   DATE OF PROCEDURE:  08/28/2008  DATE OF DISCHARGE:                               OPERATIVE REPORT   PREOPERATIVE DIAGNOSIS:  Osteoarthritis/avascular necrosis left hip.   POSTOPERATIVE DIAGNOSIS:  Osteoarthritis/avascular necrosis left hip.   PROCEDURE:  Left total hip arthroplasty.   SURGEON:  Ollen Gross, M.D.   ASSISTANT:  Alexzandrew L. Perkins, P.A.C.   ANESTHESIA:  General.   ESTIMATED BLOOD LOSS:  100.   DRAINS:  Hemovac times one.   COMPLICATIONS:  None.   CONDITION:  Stable to recovery.   BRIEF CLINICAL NOTE:  Victor Diaz is a 65 year old male who has end-stage  arthritis of the left hip with an element of osteonecrosis also.  He has  had long-term pain and increasing disability.  He presents now for left  total hip arthroplasty.   PROCEDURE IN DETAIL:  After the successful administration of general  anesthetic, the patient was placed in a right lateral decubitus position  with the left side up and held with the hip positioner.  Left lower  extremity was isolated from his perineum with plastic drapes and prepped  and draped in the usual sterile fashion.  Short posterolateral incision  was made with a 10 blade through subcutaneous tissue to the fascia lata  which was incised in line with the skin incision.  Sciatic nerve was  palpated and protected and the short external rotators isolated off the  femur.  Capsulotomy is performed and the hip was dislocated.  The center  of the femoral head is marked and a trial prosthesis was placed such  that the center of the trial head corresponds to the center of his  native femoral head.  Osteotomy lines were marked on the femoral neck  and osteotomy made with an oscillating  saw.   The femur was retracted anteriorly and then acetabular exposure was  obtained.  Retractors were placed and then labrum and osteophytes were  removed.  Acetabular reaming starts of 47 mm coursing increments up to  53 and then a 54 mm Pinnacle acetabular shell was placed in anatomic  position with excellent purchase and no need for additional dome screws.  Apex hole eliminator is placed and then a 36 mm neutral Ultamet metal  liner was placed for metal-on-metal hip replacement.   The femur was repaired with the canal finder and irrigation.  Axial  reaming is performed up to 13.5 mm, proximal reaming to an 64F and the  sleeve machined to a large.  The 64F large trial sleeve is placed with  an 18 x 13 stem and 36 plus 9 neck about 10 degrees beyond his native  anteversion which was close to neutral.  The 36 plus 0 head was placed  and introduced with excellent stability.  There was full extension and  full external rotation, 70 degrees flexion, 40 degrees adduction  and 90  degrees internal rotation and 90 degrees of flexion and about 60 degrees  of internal rotation.  By placing the left leg on top of the right I  felt as though the leg lengths were equal.  Hip was then dislocated and  trials were removed.  Permanent 50F large sleeve is placed and 18 x 13  stem and 36 plus 8 neck 10 degrees beyond native anteversion.  The 36  plus 0 head was placed and the hips reduced with the same stability  parameters.  The wound was copiously irrigated with saline solution and  the short rotators reattached to the femur through drill holes.  Fascia  lata was closed over Hemovac drain with interrupted #1 Vicryl, subcu  closed with #1 and 2-0 Vicryl and subcuticular running 4-0 Monocryl.  The incision is then cleaned and dried and Steri-Strips and a bulky  sterile dressing were applied.  The drain was hooked to suction and he  is placed into a knee immobilizer, awakened and transported to recovery   in stable condition.      Ollen Gross, M.D.  Electronically Signed     FA/MEDQ  D:  08/28/2008  T:  08/28/2008  Job:  [id number]

## 2010-08-30 NOTE — Letter (Signed)
April 14, 2010   Bruce H. Swords, MD  [address] [place], [place] [address]   Re:  Victor Diaz, Victor Diaz              DOB:  [birth year]   Dear Dr. Cato Mulligan:   I appreciate the opportunity of seeing the patient.  This patient had a  fall around Thanksgiving and another fall around March 17, 2010, that  was closely related to questionable alcohol intake.  He was admitted for  24 hours apparently and then discharged and in followup underwent a  thoracentesis, had removed 1.4 L of blood and had a followup CT on  April 13, 2010.  The CT scan showed recurrence of the effusion with  multiple loculations and some increased thickening of both the parietal  and the visceral pleura.  He was then referred to Dr. Sherene Sires who referred  him to here for VATS decortication.   MEDICATIONS:  He takes simvastatin, alprazolam, Zyrtec, omeprazole,  folic acid, vitamin B1, and oxycodone.  He has been on several  antibiotics including Levaquin.   ALLERGIES:  He has allergies to Lipitor.   PAST SURGICAL HISTORY:  He has a previous appendectomy; inguinal  herniorrhaphy; tonsillectomy; and in 2010, he had left hip replaced.   FAMILY HISTORY:  Noncontributory.   SOCIAL HISTORY:  His wife deceased of lung cancer.  He has a positive  history of alcohol abuse, quit smoking at age 72, self-employed.   REVIEW OF SYSTEMS:  VITAL SIGNS:  His weight is 180 pounds.  He is 5  feet 6 inches.  GENERAL:  He had some loss of appetite.  CARDIAC:  He has shortness of breath with exertion.  PULMONARY:  See history of present illness.  No hemoptysis.  GI:  No nausea, vomiting, constipation, or diarrhea.  GU:  No kidney disease, dysuria, or frequent urination.  VASCULAR:  Pain in his leg with walking.  No TIAs or DVT.  NEUROLOGICAL:  No dizziness, headaches, blackouts, or seizures.  MUSCULOSKELETAL:  Arthritis.  PSYCHIATRIC:  No depression or nervousness.  EYE/ENT:  No changes in eyesight or hearing.  HEMATOLOGICAL:  No problems with bleeding, clotting disorders, or  anemia.   PHYSICAL EXAMINATION:  General:  He is a well-developed Caucasian male,  in no acute distress.  Vital Signs:  His blood pressure is 140/80, pulse  100, respirations 16, and sats 96%.  Head, Eyes, Ears, Nose, and Throat:  Unremarkable.  Neck:  Supple without thyromegaly.  There is no  supraclavicular or axillary adenopathy.  Chest:  Clear to auscultation  and percussion.  There is decreased breath sounds in the right side.  Heart:  Regular, sinus rhythm.  No murmurs.  Abdomen:  Soft.  There is  no hepatosplenomegaly.  Extremities:  Pulses are 2+.  There is no  clubbing or edema.  Neurological:  He is oriented x3.  Sensory and motor  intact.  Cranial nerves intact.   I feel that he needs a VATS decortication for which we would have seen  him earlier since we were not fully described and taken care of slightly  after his injury.  I would do probably just a chest tube, but now he  will require a VATS decortication.  We plan to do this as soon as  possible on April 19, 2010, at [facility].  I appreciate the  opportunity of seeing the patient.   Sincerely,   Ines Bloomer, M.D.  Electronically Signed   DPB/MEDQ  D:  04/14/2010  T:  04/15/2010  Job:  [id number]   cc:   Charlaine Dalton. Sherene Sires, MD, FCCP

## 2010-08-30 NOTE — H&P (Signed)
NAME:  Victor Diaz, Victor Diaz         ACCOUNT NO.:  [id number]   MEDICAL RECORD NO.:  [id number]          PATIENT TYPE:  INP   LOCATION:  1605                         [facility]ACILITY:  [facility]   PHYSICIAN:  Ollen Gross, M.D.    DATE OF BIRTH:  1945-06-09   DATE OF ADMISSION:  08/28/2008  DATE OF DISCHARGE:                              HISTORY & PHYSICAL   DATE OF OFFICE VISIT HISTORY AND PHYSICAL:  August 12, 2008.   CHIEF COMPLAINT:  Left hip pain.   HISTORY OF PRESENT ILLNESS:  The patient is a 65 year old male who has  been seen by Dr. Lequita Halt for ongoing left hip and left leg pain.  It has  gotten worse over time.  He has been seen in the office.  X-ray showed  severe end-stage arthritis of the left hip with bone-on-bone and large  osteophyte formation.  At this point with his progressive symptoms and  increasing pain, it is felt he would benefit from undergoing a hip  replacement.  Risks and benefits have been discussed.  He elects to  proceed with surgery.   ALLERGIES:  No known drug allergies.   CURRENT MEDICATIONS:  Alprazolam, meloxicam, ranitidine, hydrocodone,  simvastatin.   PAST MEDICAL HISTORY:  1. Anxiety.  2. Remote questionable history of sleep apnea, but he has never been      tested.  3. Reflux disease.  4. Hypercholesterolemia.  5. Past history of gout.   PAST SURGICAL HISTORY:  1. Appendectomy.  2. Nasal polyp surgery.   FAMILY HISTORY:  Father deceased age 97 with TB.  Mother deceased age 95  with rheumatoid arthritis, also aneurysm.   SOCIAL HISTORY:  Widowed.  Works in transportation.  Past smoker, quit  at age 86.  Several drinks every other day.  Two children.  The  patient's son will be assisting with care after surgery.   REVIEW OF SYSTEMS:  GENERAL:  No fevers, chills, night sweats. NEURO:  No seizures or paralysis.  RESPIRATORY:  No shortness of breath,  productive cough or hemoptysis.  CARDIOVASCULAR:  No chest pain, angina,  orthopnea.  GI:  A  little bit of heartburn.  No nausea, vomiting,  diarrhea or constipation.  GU:  A little bit of nocturia.  No dysuria,  hematuria.  MUSCULOSKELETAL:  Left hip.   PHYSICAL EXAMINATION:  VITAL SIGNS:  Pulse 96, respiration 12, blood  pressure 120/72.  GENERAL:  A 65 year old white male ,well-nourished, well-developed, in  no acute distress.  He is alert and cooperative.  Good historian.  HEENT:  Normocephalic, atraumatic.  Pupils are round and reactive.  Oropharynx clear.  EOMs intact.  NECK:  Supple.  CHEST:  Clear.  HEART:  Regular rate and rhythm with a very faint systolic ejection  murmur best heard over the aortic point.  ABDOMEN:  Soft, nontender.  Bowel sounds present.  RECTAL, BREASTS, GENITALIA:  Not done, not pertinent to present illness.  EXTREMITIES:  Left hip:  Flexion 90-0, internal rotation 0, external  rotation 10-15 degrees abduction.   IMPRESSION:  Osteoarthritis, left hip.   PLAN:  The patient will be admitted to [facility] to undergo a  left total replacement arthroplasty.  Surgery will be performed by Ollen Gross.      Alexzandrew L. Perkins, P.A.C.      Ollen Gross, M.D.  Electronically Signed    ALP/MEDQ  D:  08/30/2008  T:  08/31/2008  Job:  [id number]   cc:   Valetta Mole. Swords, MD  [address] [place]  [place] [address]

## 2010-08-30 NOTE — Letter (Signed)
May 03, 2010   Bruce H. Swords, MD  [address] [place], [place] [address]   Re:  KEEGEN, HEFFERN              DOB:  1945/07/04   Dear Dr. Cato Mulligan:   The patient returns today and he is doing well after we did a  decortication for a hemothorax on the right side.  We removed his chest  tube sutures.  His blood pressure was 146/81, pulse 79, respirations 18,  and sats were 98%.  He has a little rash around his ribs and this is  probably from the tape and I told him to use some hydrocortisone on  this.  He can start driving in about a week.  I will see him back in 3  weeks.  I reduced his amiodarone since he had atrial fib to 200 a day.  He is also on Lopressor 12.5 twice a day and I will reduce that when I  see him back next time.   Sincerely,   Ines Bloomer, M.D.  Electronically Signed   DPB/MEDQ  D:  05/03/2010  T:  05/03/2010  Job:  [id number]   cc:   Charlaine Dalton. Sherene Sires, MD, FCCP

## 2010-09-02 NOTE — Discharge Summary (Signed)
NAMEAMOS, MICHEALS              ACCOUNT NO.:  [id number]   MEDICAL RECORD NO.:  [id number]          PATIENT TYPE:  INP   LOCATION:  1605                         [facility]AC[facility]   PHYSICIAN:  Ollen Gross, M.D.    DATE OF BIRTH:  1945/05/12   DATE OF ADMISSION:  08/28/2008  DATE OF DISCHARGE:  09/04/2008                               DISCHARGE SUMMARY   ADMISSION DIAGNOSES:  1. Osteoarthritis left hip.  2. Anxiety.  3. Questionable history of sleep apnea but never tested.  4. Reflux disease.  5. Hypercholesterolemia.  6. Past history of gout.   DISCHARGE DIAGNOSES:  1. Osteoarthritis/arteriovenous malformation left hip status post left      total hip replacement arthroplasty.  2. Postop hyponatremia.  3. Postop colonic ileus, improving.  4. Anxiety.  5. Questionable history of sleep apnea but never tested.  6. Reflux disease.  7. Hypercholesterolemia.  8. Past history of gout.   PROCEDURE:  Aug 28, 2008:  Left total hip.  Surgeon:  Dr. Despina Hick.  Avel Peace, PA-C.  Anesthesia:  General.   CONSULTS:  None.   BRIEF HISTORY:  Mr. Victor Diaz is a 65 year old male with end-stage  osteoarthritis left hip with element of osteonecrosis and long-term pain  and increased disability.  Felt to be a good candidate for surgery.   LABORATORY DATA:  CBC showed a hemoglobin 13.9, hematocrit 39.3, white  blood cell count 5.8, platelets 163.  Postop hemoglobin 11.6where it  stabilized and was last noted 11.6 and 33.9.  PT and PTT preoperatively  13.4 and 1.  PTT of 26.  Remaining serial protimes followed Coumadin  protocol.  Last PT/INR 27.5 and 2.4.  Chem panel on admission only  minimally elevated, potassium at 5.3, elevated AST 61, elevated ALT 55.  Remaining chem panel within normal limits.  Serial BMETswere followed,  sodium dropped from 239 to 133, back up to 135.  Remaining electrolytes  within normal limits.  Potassium came down to a normal level.  Preop UA  negative.  Blood type  A positive.  EKG Aug 21, 2008.  Normal sinus rhythm.  Normal EKG, confirmed by Dr.  Theora Master.   X-RAYS:  Left hip film Aug 21, 2008, no acute fracture or subluxation.  Extensive degenerative changes in the hip joints.  Serial portable AP  and KUB films taken.  First KUB on May 17 shows findings compatible with  a colonic ileus.  Followup KUB on May 18, persistent marked colonic  ileus inertia.  Next KUB on 05/19 showed slightly mild increased gaseous  distention of the colon since May 18.  Next KUB on May 20, persistent  bowel distention, slightly decreased, no free air.  Continued close  followup warranted.  The last KUB on May 21, showed slight interval  improvement in colonic distention and ileus.   HOSPITAL COURSE:  The patient admitted to [facility], taken to  the OR.  Underwent the above stated procedure without complications.  The patient tolerated the procedure well.  He was transferred to  recovery and to orthopedic  floor and started PCA and  p.o. analgesics  for pain control following surgery and given 24 hours of postop IV  antibiotics.  He was doing pretty well on the morning of day 1.  We  discontinued his PCA and hep locked his IVs after he was taking p.o.  well.  He was started on Coumadin protocol.  Started to get up out of  bed by day 2.  He was resting well, a little sore but meds were helping  with his pain.  Hemoglobin down to 11.6 at which point it stabilized.  Dressing changed, the incision looked good.  There is some consideration  of him going home in the next day or so.  By day 3, however, even though  he was walking short distances, 30 to 50 feet, he started having some  distention in his belly by day 3.  We got a KUB on that day, which was  May 17, which did show findings compatible with colonic ileus.  His  discharge was held at that time.  He was also noted to have a little bit  of a low sodium at that point.  Incision continued to heal well.  We  put  him on some GI medications, MiraLax and Reglan and various medications  to help out with his colon.  On postop day 4, May 18, the followup KUB  showed persistent marked colonic ileus, although his belly was still  sore, he was starting to move his bowels a little bit.  He had started  to pass some flatus, despite even showing some persistent colonic ileus  on that day.  His sodium had started to improve and it was back up to  139 to normal level.  On postop day 5, May 19, he was moving his bowels  and his abdomen was less distended, although the KUB that day showed  marked increased gaseous distention.  He was passing gas well and  clinically he was improving; however, had not seen improvement on the x-  rays at that point.  I repeated KUB the following day on May 20, which  showed persistent bowel distention, although slightly decreased from the  day before.  I think it was starting to get some radiographic signs of  his improvement, which he had already shown clinically the day before.  Hewas walking 100 feet.  We wanted to make sure he was out of the woods  when it comes to his ileus.  Kept him 1 more day.  The following day, he  was feeling a little bit better. and on Sep 04 2008, his last KUB showed  slight interval improvement in colonic distention of the ileus.  He was  doing well and we felt that was resolving.  He was meeting his goals and  discharged home.   DISCHARGE PLAN:  1. Discharge home on Sep 04, 2008.  2. Discharge diagnoses, please see above.  3. Discharge meds:  Ultracet, Robaxin, Coumadin.   DISCHARGE DIET:  Low cholesterol diet.   DISCHARGE ACTIVITIES:  Partial weightbearing left lower extremity.  Hip  precautions, total hip protocol.   Recommend over-the-counter Colace and MiraLax.   DISCHARGE FOLLOWUP:  In 2 weeks.   DISPOSITION:  Home.   DISCHARGE CONDITION:  Improved.      Alexzandrew L. Perkins, P.A.C.      Ollen Gross, M.D.   Electronically Signed    ALP/MEDQ  D:  10/15/2008  T:  10/15/2008  Job:  [id number]   cc:   Valetta Mole.  Swords, MD  [address] [place]  [place] [address]

## 2010-09-02 NOTE — Op Note (Signed)
NAME:  Victor Diaz, Victor Diaz              ACCOUNT NO.:  [id number]   MEDICAL RECORD NO.:  [id number]          PATIENT TYPE:  AMB   LOCATION:  DAY                          FACILITY:  [facility]   PHYSICIAN:  Wilmon Arms. Corliss Skains, M.D. DATE OF BIRTH:  1946/02/16   DATE OF PROCEDURE:  03/03/2005  DATE OF DISCHARGE:                                 OPERATIVE REPORT   PREOPERATIVE DIAGNOSES:  Right inguinal hernia.   POSTOPERATIVE DIAGNOSES:  Right inguinal hernia.   PROCEDURE:  Right inguinal mesh herniorrhaphy.   SURGEON:  Wilmon Arms. Tsuei, M.D.   ANESTHESIA:  General via LMA.   INDICATIONS FOR PROCEDURE:  The patient is a healthy 65 year old male who  was noted on routine physical examination to have some asymmetry in the  position of his testicles. Dr. Cato Mulligan evaluated the patient and felt a right  inguinal hernia. He was referred for further evaluation. The patient was  completely asymptomatic otherwise. On examination, he had an easily  reducible right inguinal hernia with no evidence of a left inguinal hernia.  We chose elective repair now while the patient is asymptomatic.   DESCRIPTION OF PROCEDURE:  The patient was brought to the operating room,  placed in supine position on the operating room table. After an adequate  level of general anesthesia was obtained, the patient's right groin was  shaved, prepped with Betadine, draped in a sterile fashion. He had been  given preoperative antibiotics. A time out was then taken to ensure the  proper patient and proper procedure. An oblique incision was made just above  the inguinal ligament. Dissection was carried down through the subcutaneous  fat to the external oblique fascia. The fascia was opened along the  direction of its fibers down to the internal ring. The spermatic cord was  circumferentially dissected. A large lipoma and hernia sac were dissected  free of the spermatic cord and reduced up through the internal ring. The  floor of the  canal felt lax but there was no direct defect. A key hole mesh  was brought onto the field, cut to fit and was sutured in place with  interrupted 2-0 Vicryl sutures beginning at the pubic tubercle. The tails  were tucked around the spermatic cord and sutured together. The lateral end  was tucked underneath the external oblique fascia. The wound was then  irrigated. 2-0 Vicryl was used to close the external oblique fascia over the  mesh. 3-0 Vicryl was used to close the subcutaneous tissues and 4-0 Monocryl  was used to close the skin in  subcuticular fashion. Steri-Strips and a clean dressing were applied. Both  testicles were noted down in the scrotum at the end of the case. All sponge,  instrument and needle counts were correct. The patient was awakened and  brought to the recovery room in stable condition.      Wilmon Arms. Tsuei, M.D.  Electronically Signed     MKT/MEDQ  D:  03/03/2005  T:  03/03/2005  Job:  [id number]   cc:   Valetta Mole. Swords, M.D. [facility]  [address] [place]   [id number]

## 2010-09-22 ENCOUNTER — Ambulatory Visit (INDEPENDENT_AMBULATORY_CARE_PROVIDER_SITE_OTHER): Payer: Medicare Other | Admitting: Internal Medicine

## 2010-09-22 ENCOUNTER — Ambulatory Visit (INDEPENDENT_AMBULATORY_CARE_PROVIDER_SITE_OTHER)
Admission: RE | Admit: 2010-09-22 | Discharge: 2010-09-22 | Disposition: A | Discharge status: Home / Self Care | Payer: Medicare Other | Source: Ambulatory Visit | Attending: Internal Medicine | Admitting: Internal Medicine

## 2010-09-22 ENCOUNTER — Encounter: Payer: Self-pay | Admitting: Internal Medicine

## 2010-09-22 VITALS — BP 144/98 | Temp 98.7°F | Ht 66.0 in | Wt 192.0 lb

## 2010-09-22 DIAGNOSIS — J90 Pleural effusion, not elsewhere classified: Secondary | ICD-10-CM

## 2010-09-22 NOTE — Progress Notes (Signed)
  Subjective:    Patient ID: Victor Diaz, male    DOB: 10-13-1945, 65 y.o.   MRN: [id number]  HPI  Patient comes in complaining of right-sided chest discomfort. I have reviewed his previous history that includes recurrent pleural effusion eventually resulting in a vats procedure. He denies any shortness of breath. He does not currently takes a deep breath he has some right-sided chest pain. He denies any rash over the area. He denies any fever or chills.  Past Medical History  Diagnosis Date  . Allergy   . Gout   . Hyperlipidemia   . ETOH dependence   . Hemothorax on right    Past Surgical History  Procedure Date  . Appendectomy   . Hernia repair   . Tonsilectomy, adenoidectomy, bilateral myringotomy and tubes   . 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 Partial hip arthroplasty 2010    left    reports that he quit smoking about 41 years ago. He has never used smokeless tobacco. He reports that he drinks alcohol. He reports that he does not use illicit drugs. family history includes Arthritis in his mother. Allergies  Allergen Reactions  . Atorvastatin     REACTION: facial swelling     Review of Systems  patient denies chest pain, shortness of breath, orthopnea. Denies lower extremity edema, abdominal pain, change in appetite, change in bowel movements. Patient denies rashes, musculoskeletal complaints. No other specific complaints in a complete review of systems.      Objective:   Physical Exam  well-developed well-nourished male in no acute distress. HEENT exam atraumatic, normocephalic, neck supple without jugular venous distention. Chest clear to auscultation cardiac exam S1-S2 are regular. Abdominal exam overweight with bowel sounds, soft and nontender. Extremities no edema. Neurologic exam is alert with a normal gait.        Assessment & Plan:

## 2010-09-22 NOTE — Assessment & Plan Note (Signed)
Has significant discomfort at operative site and right lungs Exam is unremarkable Check xray

## 2010-10-11 ENCOUNTER — Other Ambulatory Visit: Payer: Self-pay | Admitting: Internal Medicine

## 2010-10-20 ENCOUNTER — Telehealth: Payer: Self-pay | Admitting: *Deleted

## 2010-10-20 MED ORDER — TADALAFIL 20 MG PO TABS: 20.0000 mg | ORAL_TABLET | Freq: Every day | ORAL | Status: AC | PRN

## 2010-10-20 NOTE — Telephone Encounter (Signed)
cialis 100 mg  1/2 - 1 po qod prn for sexual activity #10/3 refills

## 2010-10-20 NOTE — Telephone Encounter (Signed)
Pt wants Dr. Cato Mulligan to prescribe Cialis or Viagra.

## 2010-10-20 NOTE — Telephone Encounter (Signed)
rx called in, pt aware 

## 2010-10-20 NOTE — Telephone Encounter (Signed)
100 mg?  Does Cialis come in 100 mg?

## 2010-10-20 NOTE — Telephone Encounter (Signed)
My mistake cialis 20 mg---same instructions

## 2010-11-22 ENCOUNTER — Other Ambulatory Visit: Payer: Self-pay | Admitting: Internal Medicine

## 2010-12-16 ENCOUNTER — Other Ambulatory Visit: Payer: Self-pay | Admitting: *Deleted

## 2010-12-16 MED ORDER — ALPRAZOLAM 1 MG PO TABS: 1.0000 mg | ORAL_TABLET | Freq: Every evening | ORAL | Status: DC | PRN

## 2010-12-25 ENCOUNTER — Emergency Department: Payer: Self-pay | Admitting: Internal Medicine

## 2011-01-09 ENCOUNTER — Encounter (HOSPITAL_COMMUNITY): Payer: Self-pay | Admitting: *Deleted

## 2011-01-09 ENCOUNTER — Emergency Department (HOSPITAL_COMMUNITY): Payer: Medicare Other

## 2011-01-09 ENCOUNTER — Emergency Department (HOSPITAL_COMMUNITY)
Admission: EM | Admit: 2011-01-09 | Discharge: 2011-01-09 | Disposition: A | Discharge status: Home / Self Care | Payer: Medicare Other | Attending: Emergency Medicine | Admitting: Emergency Medicine

## 2011-01-09 DIAGNOSIS — F17200 Nicotine dependence, unspecified, uncomplicated: Secondary | ICD-10-CM | POA: Insufficient documentation

## 2011-01-09 DIAGNOSIS — W19XXXA Unspecified fall, initial encounter: Secondary | ICD-10-CM | POA: Insufficient documentation

## 2011-01-09 DIAGNOSIS — IMO0002 Reserved for concepts with insufficient information to code with codable children: Secondary | ICD-10-CM | POA: Insufficient documentation

## 2011-01-09 DIAGNOSIS — Y92009 Unspecified place in unspecified non-institutional (private) residence as the place of occurrence of the external cause: Secondary | ICD-10-CM | POA: Insufficient documentation

## 2011-01-09 DIAGNOSIS — F1010 Alcohol abuse, uncomplicated: Secondary | ICD-10-CM | POA: Insufficient documentation

## 2011-01-09 DIAGNOSIS — E785 Hyperlipidemia, unspecified: Secondary | ICD-10-CM | POA: Insufficient documentation

## 2011-01-09 LAB — CBC
Hemoglobin: 13.2 g/dL (ref 13.0–17.0)
MCH: 33.3 pg (ref 26.0–34.0)
RBC: 3.96 MIL/uL — ABNORMAL LOW (ref 4.22–5.81)
WBC: 6.2 10*3/uL (ref 4.0–10.5)

## 2011-01-09 LAB — BASIC METABOLIC PANEL
BUN: 4 mg/dL — ABNORMAL LOW (ref 6–23)
Chloride: 97 mEq/L (ref 96–112)
Glucose, Bld: 87 mg/dL (ref 70–99)
Potassium: 3.9 mEq/L (ref 3.5–5.1)

## 2011-01-09 LAB — URINALYSIS, ROUTINE W REFLEX MICROSCOPIC
Glucose, UA: NEGATIVE mg/dL
Leukocytes, UA: NEGATIVE
Specific Gravity, Urine: 1.005 (ref 1.005–1.030)
Urobilinogen, UA: 0.2 mg/dL (ref 0.0–1.0)

## 2011-01-09 LAB — DIFFERENTIAL
Lymphocytes Relative: 25 % (ref 12–46)
Lymphs Abs: 1.5 10*3/uL (ref 0.7–4.0)
Monocytes Relative: 10 % (ref 3–12)
Neutro Abs: 3.8 10*3/uL (ref 1.7–7.7)
Neutrophils Relative %: 62 % (ref 43–77)

## 2011-01-09 LAB — RAPID URINE DRUG SCREEN, HOSP PERFORMED
Amphetamines: NOT DETECTED
Cocaine: NOT DETECTED
Opiates: NOT DETECTED
Tetrahydrocannabinol: NOT DETECTED

## 2011-01-09 MED ORDER — SODIUM CHLORIDE 0.9 % IV BOLUS (SEPSIS): 1000.0000 mL | Freq: Once | INTRAVENOUS | Status: DC

## 2011-01-09 NOTE — ED Notes (Signed)
ems reports called out for a fall. Pt admits to drinking, put states not much. Pt states he is depressed & going to jail. Does not want to talk about it.

## 2011-01-09 NOTE — ED Notes (Signed)
Pt answering questions, slowly.  Pt stood to side of bed and urinated into urinal.

## 2011-01-09 NOTE — ED Provider Notes (Signed)
History     CSN: [id number] Arrival date & time: 01/09/2011  4:35 AM  Chief Complaint  Patient presents with  . Fall  . Alcohol Intoxication    HPI  (Consider location/radiation/quality/duration/timing/severity/associated sxs/prior treatment)  Patient is a 65 y.o. male presenting with fall and intoxication. The history is provided by the patient, the EMS personnel and a relative.  Fall Pertinent negatives include no fever, no abdominal pain, no nausea, no vomiting and no headaches.  Alcohol Intoxication Pertinent negatives include no chest pain, no abdominal pain, no headaches and no shortness of breath.   history provided by medics in by son who I spoke to on the phone. Patient also provides some history. Patient had spent the [place] the beach with his [place]. He admits to doing some drinking and admits to drinking 6-7 beers per day. Reportedly he had become intoxicated and had fallen down, his girlfriend called EMS and he was brought to a local hospital [place] the [place] near [facility]. His son I received a phone call yesterday that he was in the hospital, so he drove to the beach to the hospital and pick him up and take him home. Went home the patient persisted to drink more, became intoxicated and fell down again with urinary incontinence. The son became concerned that he was unable to control his father and his drinking so he called EMS. He is not sure if he hit his head he did not have any seizure activity and he did not have any loss of consciousness. Patient confirms some of these details, he tells me the last time he had a drink was around noon not last night. He denies any pain or injury at this time. When asked about the abrasion this is lower extremities in the area of his left arm, he states he is not sure what happened. He denies any chest pain or shortness of breath. He denies any neck pain. He denies any weakness or numbness.  Past Medical History  Diagnosis Date  .  Allergy   . Gout   . Hyperlipidemia   . ETOH dependence   . Hemothorax on right     Past Surgical History  Procedure Date  . Appendectomy   . Hernia repair   . Tonsilectomy, adenoidectomy, bilateral myringotomy and tubes   . 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 Partial hip arthroplasty 2010    left    Family History  Problem Relation Age of Onset  . Arthritis Mother     History  Substance Use Topics  . Smoking status: Former Smoker -- 1.0 packs/day for 10 years    Quit date: 08/09/1969  . Smokeless tobacco: Never Used  . Alcohol Use: Yes      Review of Systems  Review of Systems  Constitutional: Negative for fever and chills.  HENT: Negative for neck pain and neck stiffness.   Eyes: Negative for pain.  Respiratory: Negative for chest tightness and shortness of breath.   Cardiovascular: Negative for chest pain and leg swelling.  Gastrointestinal: Negative for nausea, vomiting, abdominal pain and diarrhea.  Genitourinary: Negative for dysuria and flank pain.  Musculoskeletal: Negative for back pain.  Skin: Negative for rash.  Neurological: Negative for syncope, speech difficulty and headaches.  All other systems reviewed and are negative.    Allergies  Atorvastatin  Home Medications   Current Outpatient Rx  Name Route Sig Dispense Refill  . ALPRAZOLAM 1 MG PO TABS Oral Take 1 tablet (  1 mg total) by mouth at bedtime as needed for sleep. 20 tablet 2  . CETIRIZINE HCL 10 MG PO TABS Oral Take 10 mg by mouth daily.      . INDOMETHACIN 25 MG PO CAPS Oral Take 25 mg by mouth 2 (two) times daily with a meal.      . OMEPRAZOLE 20 MG PO CPDR  TAKE 1 CAPSULE DAILY 90 capsule 2  . SIMVASTATIN 40 MG PO TABS  TAKE 1 TABLET AT BEDTIME 90 tablet 2    Physical Exam    BP 130/83  Pulse 96  Temp(Src) 97.5 F (36.4 C) (Oral)  Resp 18  Ht 5\' 6"  (1.676 m)  Wt 180 lb (81.647 kg)  BMI 29.05 kg/m2  SpO2 97%  Physical Exam  Constitutional: He is oriented to  person, place, and time. He appears well-developed and well-nourished.  HENT:  Head: Normocephalic and atraumatic.       No swelling or ecchymosis. The laxer abrasions.  Eyes: Conjunctivae and EOM are normal. Pupils are equal, round, and reactive to light.  Neck: Trachea normal. Neck supple. No thyromegaly present.       No midline tenderness, step-off or deformity.  Cardiovascular: Normal rate, regular rhythm, S1 normal, S2 normal and normal pulses.     No systolic murmur is present   No diastolic murmur is present  Pulses:      Radial pulses are 2+ on the right side, and 2+ on the left side.  Pulmonary/Chest: Effort normal and breath sounds normal. He has no wheezes. He has no rhonchi. He has no rales. He exhibits no tenderness.  Abdominal: Soft. Normal appearance and bowel sounds are normal. There is no tenderness. There is no CVA tenderness and negative Murphy's sign.       No tenderness, guarding or peritonitis. There is mild reaction from what looks like tape and leads from monitor consistent with story of recent hospitalization.  Musculoskeletal:       Multiple abrasions lower extremities, all are superficial and mostly over her bilateral knees. There is no underlying bony tenderness or deformity. Distal neurovascular is intact x4. Left elbow has some superficial abrasions there is full range of motion at the elbow with no tenderness, no swelling and no associated wrist tenderness or swelling.  Neurological: He is alert and oriented to person, place, and time. He has normal strength. No cranial nerve deficit or sensory deficit. GCS eye subscore is 4. GCS verbal subscore is 5. GCS motor subscore is 6.       Speech is slightly slurred consistent with alcohol intoxication. Patient is alert and oriented to time date month year and president  Skin: Skin is warm and dry. No rash noted. He is not diaphoretic.  Psychiatric: His speech is normal.       Cooperative and appropriate    ED Course    Procedures (including critical care time)  Results for orders placed during the hospital encounter of 01/09/11  CBC      Component Value Range   WBC 6.2  4.0 - 10.5 (K/uL)   RBC 3.96 (*) 4.22 - 5.81 (MIL/uL)   Hemoglobin 13.2  13.0 - 17.0 (g/dL)   HCT 16.1 (*) 09.6 - 52.0 (%)   MCV 95.7  78.0 - 100.0 (fL)   MCH 33.3  26.0 - 34.0 (pg)   MCHC 34.8  30.0 - 36.0 (g/dL)   RDW 04.5  40.9 - 81.1 (%)   Platelets 121 (*) 150 - 400 (  K/uL)  DIFFERENTIAL      Component Value Range   Neutrophils Relative 62  43 - 77 (%)   Neutro Abs 3.8  1.7 - 7.7 (K/uL)   Lymphocytes Relative 25  12 - 46 (%)   Lymphs Abs 1.5  0.7 - 4.0 (K/uL)   Monocytes Relative 10  3 - 12 (%)   Monocytes Absolute 0.6  0.1 - 1.0 (K/uL)   Eosinophils Relative 3  0 - 5 (%)   Eosinophils Absolute 0.2  0.0 - 0.7 (K/uL)   Basophils Relative 1  0 - 1 (%)   Basophils Absolute 0.0  0.0 - 0.1 (K/uL)  BASIC METABOLIC PANEL      Component Value Range   Sodium 135  135 - 145 (mEq/L)   Potassium 3.9  3.5 - 5.1 (mEq/L)   Chloride 97  96 - 112 (mEq/L)   CO2 27  19 - 32 (mEq/L)   Glucose, Bld 87  70 - 99 (mg/dL)   BUN 4 (*) 6 - 23 (mg/dL)   Creatinine, Ser 4.09  0.50 - 1.35 (mg/dL)   Calcium 8.5  8.4 - 81.1 (mg/dL)   GFR calc non Af Amer >60  >60 (mL/min)   GFR calc Af Amer >60  >60 (mL/min)  ETHANOL      Component Value Range   Alcohol, Ethyl (B) 288 (*) 0 - 11 (mg/dL)  URINALYSIS, ROUTINE W REFLEX MICROSCOPIC      Component Value Range   Color, Urine YELLOW  YELLOW    Appearance CLEAR  CLEAR    Specific Gravity, Urine 1.005  1.005 - 1.030    pH 5.5  5.0 - 8.0    Glucose, UA NEGATIVE  NEGATIVE (mg/dL)   Hgb urine dipstick TRACE (*) NEGATIVE    Bilirubin Urine NEGATIVE  NEGATIVE    Ketones, ur NEGATIVE  NEGATIVE (mg/dL)   Protein, ur NEGATIVE  NEGATIVE (mg/dL)   Urobilinogen, UA 0.2  0.0 - 1.0 (mg/dL)   Nitrite NEGATIVE  NEGATIVE    Leukocytes, UA NEGATIVE  NEGATIVE   URINE RAPID DRUG SCREEN (HOSP PERFORMED)       Component Value Range   Opiates NONE DETECTED  NONE DETECTED    Cocaine NONE DETECTED  NONE DETECTED    Benzodiazepines POSITIVE (*) NONE DETECTED    Amphetamines NONE DETECTED  NONE DETECTED    Tetrahydrocannabinol NONE DETECTED  NONE DETECTED    Barbiturates NONE DETECTED  NONE DETECTED   URINE MICROSCOPIC-ADD ON      Component Value Range   RBC / HPF 0-2  <3 (RBC/hpf)   No results found.    MDM   Patient evaluated for a fall tonight after drinking alcohol. He is not a reliable historian due to current condition, CT scans ordered and labs obtained. Patient was given IV fluids records from [facility] were requested, obtained and reviewed patient was evaluated for similar complaints after 7 days of binge drinking. His alcohol was 336 at 1549 on 01/08/11, he was given a banana bag of IV fluids he was able to ambulate and was deemed stable for discharge home with his son. There is a CT scan report from the same time frame reported as negative for acute intracranial process.  CT scans obtained for fall again at home tonight and pending report at 7am. Plan, allow PT to continue to sober, PTs son to come to the ER.      Sunnie Nielsen, MD 01/09/11 [phone]

## 2011-01-09 NOTE — ED Notes (Signed)
Pt has numerous abrasions to bilateral knees and arms, old bruising to bilateral buttocks.  Pt pt right big toe is bloody and bruised.

## 2011-01-09 NOTE — ED Notes (Signed)
Contacted pt son, son said pt had been drinking all week and falling while at the beach.  Pt was taken to an unknown hospital in [place] where he had a CT?  Son was unable to fully answer questions and said he wanted [place] to take care of his Dad.

## 2011-01-09 NOTE — ED Provider Notes (Signed)
Pt's family/friend is at bedside and ready to take him home. He is walking in the ED without any instability.   Arhum Peeples B. Bernette Mayers, MD 01/09/11 1002

## 2011-01-09 NOTE — ED Notes (Signed)
Lobby rechecked pt has no family in lobby. Pt notified and allowed to use phone.

## 2011-01-09 NOTE — ED Notes (Signed)
Pt out of bed again. When asked to sit back down pt states he thought he heard someone out in the hall.

## 2011-01-09 NOTE — ED Notes (Signed)
Waiting room checked pt has no family in waiting room.

## 2011-01-09 NOTE — ED Notes (Signed)
Pt up in room. Pt asked to sit back in bed for safety. Pt still asking for his sons.

## 2011-01-09 NOTE — ED Notes (Signed)
pts emergency contact called no answer. Message left.

## 2011-01-09 NOTE — ED Notes (Signed)
Pt gave new number to call. Bethann Berkshire (friend of pt) called states he lives in [place] and is going to pick pt up but it will be a while.

## 2011-01-09 NOTE — ED Notes (Signed)
Pt right second toe nail missing.  Toes cleaned and dressed and bandaged.

## 2011-01-11 ENCOUNTER — Inpatient Hospital Stay: Payer: Self-pay | Admitting: Psychiatry

## 2011-01-16 DIAGNOSIS — I498 Other specified cardiac arrhythmias: Secondary | ICD-10-CM

## 2011-02-02 LAB — CBC
HCT: 39.5
Hemoglobin: 14.2
MCV: 89.7
Platelets: 105 — ABNORMAL LOW
WBC: 6.9

## 2011-02-02 LAB — COMPREHENSIVE METABOLIC PANEL
Albumin: 3.5
Alkaline Phosphatase: 51
BUN: 9
Chloride: 106
Creatinine, Ser: 0.86
GFR calc non Af Amer: >60
Glucose, Bld: 101 — ABNORMAL HIGH
Potassium: 3.7
Total Bilirubin: 1

## 2011-02-02 LAB — DIFFERENTIAL
Basophils Absolute: 0
Basophils Relative: 1
Lymphocytes Relative: 54 — ABNORMAL HIGH
Monocytes Absolute: 0.4
Neutro Abs: 2.4
Neutrophils Relative %: 35 — ABNORMAL LOW

## 2011-02-15 ENCOUNTER — Ambulatory Visit: Payer: Medicare Other | Admitting: Internal Medicine

## 2011-03-10 ENCOUNTER — Telehealth: Payer: Self-pay | Admitting: Internal Medicine

## 2011-03-10 NOTE — Telephone Encounter (Signed)
Pt was scheduled in October for a hosp follow up on his heart and canceled, pt is wanting to come in now for the hospital follow up not openings for this until next year. Please advise. Also pt was interested in being referred for a stress test. Please contact pt

## 2011-03-14 NOTE — Telephone Encounter (Signed)
Pt is aware waiting on MD to reply. Pt will be in court on 03-22-2011 for DWI. Pt is requesting stress test asap

## 2011-03-15 NOTE — Telephone Encounter (Signed)
Pt returned call and has been schd for first avail ov with Dr Cato Mulligan on 04/12/11 at 9:30am, as noted.

## 2011-03-15 NOTE — Telephone Encounter (Signed)
i'll need to see before ordering stress test.  Schedule next available

## 2011-03-15 NOTE — Telephone Encounter (Signed)
Lmom for pt to call back. 

## 2011-03-16 ENCOUNTER — Other Ambulatory Visit: Payer: Self-pay | Admitting: Internal Medicine

## 2011-03-24 ENCOUNTER — Telehealth: Payer: Self-pay | Admitting: Internal Medicine

## 2011-03-24 NOTE — Telephone Encounter (Signed)
Pt requesting refill on Axyclovir. Pt stared that it has been 2 years since he received the script, it is not listed in his chart.  [facility]# [phone]

## 2011-03-26 NOTE — Telephone Encounter (Signed)
Check what dose he takes and how often he takes it.

## 2011-03-27 NOTE — Telephone Encounter (Signed)
Pt is taking acyclovir 200mg  qid.  Per Dr Cato Mulligan, pt is ok to d/c, pt aware

## 2011-03-28 ENCOUNTER — Telehealth: Payer: Self-pay | Admitting: Internal Medicine

## 2011-03-28 NOTE — Telephone Encounter (Signed)
Pt would like to switch to cialis 100mg  instead 20mg . Pt will cut in fifth, this will save pt money. [facility] [phone]

## 2011-03-29 NOTE — Telephone Encounter (Signed)
Pt is calling to check on the status of new rx. Thanks.

## 2011-03-30 MED ORDER — SILDENAFIL CITRATE 100 MG PO TABS: 100.0000 mg | ORAL_TABLET | ORAL | Status: DC | PRN

## 2011-03-30 NOTE — Telephone Encounter (Signed)
There is no such dose. 20 mg is maximum dose

## 2011-03-30 NOTE — Telephone Encounter (Signed)
Pt called and has been informed that 20mg  is maxium dose for Cialis. Pt wants to know if he could switch to Viagra at a higher dose. Pt is leaving to go out of town today at 1pm and would like to pick up new med then. Pls call in to [facility] in [place] and notify pt when this has been called in.

## 2011-03-30 NOTE — Telephone Encounter (Signed)
Ok per Dr Cato Mulligan, Viagra 100 mg 1 po qod prn #10 with 11 refills, told pt to stop of cialis and sent Viagra in electronically

## 2011-04-12 ENCOUNTER — Ambulatory Visit (INDEPENDENT_AMBULATORY_CARE_PROVIDER_SITE_OTHER): Payer: Medicare Other | Admitting: Internal Medicine

## 2011-04-12 ENCOUNTER — Encounter: Payer: Self-pay | Admitting: Internal Medicine

## 2011-04-12 VITALS — BP 120/84 | Temp 97.5°F | Ht 66.0 in | Wt 194.0 lb

## 2011-04-12 DIAGNOSIS — K219 Gastro-esophageal reflux disease without esophagitis: Secondary | ICD-10-CM

## 2011-04-12 DIAGNOSIS — E785 Hyperlipidemia, unspecified: Secondary | ICD-10-CM

## 2011-04-12 DIAGNOSIS — Z23 Encounter for immunization: Secondary | ICD-10-CM

## 2011-04-12 DIAGNOSIS — F1020 Alcohol dependence, uncomplicated: Secondary | ICD-10-CM

## 2011-04-12 LAB — CBC WITH DIFFERENTIAL/PLATELET
Basophils Relative: 0.8 % (ref 0.0–3.0)
Eosinophils Absolute: 0.3 10*3/uL (ref 0.0–0.7)
Eosinophils Relative: 5.4 % — ABNORMAL HIGH (ref 0.0–5.0)
Lymphocytes Relative: 32.9 % (ref 12.0–46.0)
MCV: 94.2 fl (ref 78.0–100.0)
Monocytes Absolute: 0.6 10*3/uL (ref 0.1–1.0)
Neutrophils Relative %: 49.4 % (ref 43.0–77.0)
Platelets: 175 10*3/uL (ref 150.0–400.0)
RBC: 4.53 Mil/uL (ref 4.22–5.81)
WBC: 5.2 10*3/uL (ref 4.5–10.5)

## 2011-04-12 LAB — HEPATIC FUNCTION PANEL
AST: 19 U/L (ref 0–37)
Albumin: 4.2 g/dL (ref 3.5–5.2)
Alkaline Phosphatase: 38 U/L — ABNORMAL LOW (ref 39–117)
Bilirubin, Direct: 0 mg/dL (ref 0.0–0.3)
Total Bilirubin: 0.4 mg/dL (ref 0.3–1.2)

## 2011-04-12 LAB — LIPID PANEL
HDL: 43.5 mg/dL (ref >39.00)
LDL Cholesterol: 99 mg/dL (ref 0–99)
Total CHOL/HDL Ratio: 4
VLDL: 24.6 mg/dL (ref 0.0–40.0)

## 2011-04-12 NOTE — Assessment & Plan Note (Signed)
No sxs on meds 

## 2011-04-12 NOTE — Progress Notes (Signed)
Patient ID: Victor Diaz, male   DOB: February 10, 1946, 65 y.o.   MRN: [id number] Alcohol Abuse---DUI in September Has spent time in detox and rehab. Has not had any alcohol in 3 months.   Pt admits to a lot of anxiety but he is doing better. He admits to occasional anxiety---uses xanax as needed.   Past Medical History  Diagnosis Date  . Allergy   . Gout   . Hyperlipidemia   . ETOH dependence   . Hemothorax on right    Past Surgical History  Procedure Date  . Appendectomy   . Hernia repair   . Tonsilectomy, adenoidectomy, bilateral myringotomy and tubes   . 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 Partial hip arthroplasty 2010    left    reports that he quit smoking about 41 years ago. He has never used smokeless tobacco. He reports that he drinks alcohol. He reports that he does not use illicit drugs. family history includes Arthritis in his mother. Allergies  Allergen Reactions  . Atorvastatin Other (See Comments)    facial swelling    well-developed well-nourished male in no acute distress. HEENT exam atraumatic, normocephalic, neck supple without jugular venous distention. Chest clear to auscultation cardiac exam S1-S2 are regular. Abdominal exam overweight with bowel sounds, soft and nontender. Extremities no edema. Neurologic exam is alert with a normal gait.

## 2011-04-12 NOTE — Assessment & Plan Note (Signed)
No alcohol in 3 months--congratulated He continues to go to outpatient counselling in [place]

## 2011-04-12 NOTE — Assessment & Plan Note (Signed)
Check labs today. Continue simvastatin.  

## 2011-05-17 ENCOUNTER — Telehealth: Payer: Self-pay | Admitting: Internal Medicine

## 2011-05-17 NOTE — Telephone Encounter (Signed)
Stay on same meds for now 

## 2011-05-17 NOTE — Telephone Encounter (Signed)
Pt called and said that he has a supplement insurance to his [facility], which allows pt to get some generic meds for free. Pt is wanting to see if Dr Cato Mulligan could write a script for Generic Ambien and Generic Xanax for 90 day supply on both to [facility] mail order pharmacy. The phone # is [phone] and their fax # is [phone].     Pt would like nurse to call asap.

## 2011-05-17 NOTE — Telephone Encounter (Signed)
I don't see Remus Loffler on med list

## 2011-05-18 NOTE — Telephone Encounter (Signed)
Left message on pt's personal voicemail 

## 2011-06-20 ENCOUNTER — Other Ambulatory Visit: Payer: Self-pay | Admitting: *Deleted

## 2011-06-20 MED ORDER — ALPRAZOLAM 1 MG PO TABS: 1.0000 mg | ORAL_TABLET | Freq: Every evening | ORAL | Status: DC | PRN

## 2011-07-19 ENCOUNTER — Other Ambulatory Visit: Payer: Self-pay

## 2011-07-19 ENCOUNTER — Encounter (HOSPITAL_COMMUNITY): Payer: Self-pay | Admitting: *Deleted

## 2011-07-19 ENCOUNTER — Emergency Department (HOSPITAL_COMMUNITY)
Admission: EM | Admit: 2011-07-19 | Discharge: 2011-07-19 | Disposition: A | Discharge status: Home / Self Care | Payer: Medicare Other | Attending: Emergency Medicine | Admitting: Emergency Medicine

## 2011-07-19 ENCOUNTER — Emergency Department (HOSPITAL_COMMUNITY): Payer: Medicare Other

## 2011-07-19 DIAGNOSIS — E785 Hyperlipidemia, unspecified: Secondary | ICD-10-CM | POA: Insufficient documentation

## 2011-07-19 DIAGNOSIS — J209 Acute bronchitis, unspecified: Secondary | ICD-10-CM

## 2011-07-19 DIAGNOSIS — R079 Chest pain, unspecified: Secondary | ICD-10-CM | POA: Insufficient documentation

## 2011-07-19 DIAGNOSIS — Z79899 Other long term (current) drug therapy: Secondary | ICD-10-CM | POA: Insufficient documentation

## 2011-07-19 LAB — POCT I-STAT TROPONIN I: Troponin i, poc: 0.03 ng/mL (ref 0.00–0.08)

## 2011-07-19 MED ORDER — ALBUTEROL SULFATE HFA 108 (90 BASE) MCG/ACT IN AERS: 2.0000 | INHALATION_SPRAY | RESPIRATORY_TRACT | Status: DC | PRN

## 2011-07-19 MED ORDER — PREDNISONE 20 MG PO TABS: ORAL_TABLET | ORAL | Status: AC

## 2011-07-19 NOTE — ED Notes (Signed)
Cough, congestion  For 2 weeks,  Gray sputum.  Pain  In chest worse today.

## 2011-07-19 NOTE — Discharge Instructions (Signed)
You appear to have an upper respiratory infection (URI) or bronchitis. An upper respiratory tract infection and most bronchitis infections are viral infections of the air passages. It is contagious and can be spread to others, especially during the first 3 or 4 days. It cannot be cured by antibiotics or other medicines.  RETURN IMMEDIATELY IF you develop worse shortness of breath, confusion or altered mental status, a new rash, become dizzy, faint, or poorly responsive, or are unable to be cared for at home.

## 2011-07-19 NOTE — ED Notes (Signed)
Patient transported to X-ray 

## 2011-07-19 NOTE — ED Provider Notes (Signed)
History   This chart was scribed for Hurman Horn, MD by Clarita Crane. The patient was seen in room APA06/APA06. Patient's care was started at 1140.   CSN: [id number]  Arrival date & time 07/19/11  1140   First MD Initiated Contact with Patient 07/19/11 1215      Chief Complaint  Patient presents with  . Chest Pain    (Consider location/radiation/quality/duration/timing/severity/associated sxs/prior treatment) HPI Victor Diaz is a 66 y.o. male who presents to the Emergency Department complaining of constant moderate productive cough with grey sputum and associated mild SOB and chest soreness with coughing onset 2 weeks ago and persistent since. Patient reports recent sick contact with family member. Denies chest soreness/pain at rest. Denies syncope, fever, chills, confusion, nausea, vomiting, diarrhea, focal weakness, hematemesis, hallucinations. Patient with h/o HLD, alcohol dependence and right sided hemothorax. Denies h/o COPD.  Past Medical History  Diagnosis Date  . Allergy   . Gout   . Hyperlipidemia   . EtOH dependence   . Hemothorax on right     Past Surgical History  Procedure Date  . Appendectomy   . Hernia repair   . Tonsilectomy, adenoidectomy, bilateral myringotomy and tubes   . 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 Partial hip arthroplasty 2010    left    Family History  Problem Relation Age of Onset  . Arthritis Mother     History  Substance Use Topics  . Smoking status: Former Smoker -- 1.0 packs/day for 10 years    Quit date: 08/09/1969  . Smokeless tobacco: Never Used  . Alcohol Use: Yes      Review of Systems A complete 10 system review of systems was obtained and all systems are negative except as noted in the HPI and PMH.   Allergies  Atorvastatin  Home Medications   Current Outpatient Rx  Name Route Sig Dispense Refill  . ALPRAZOLAM 1 MG PO TABS Oral Take 1 mg by mouth 3 (three) times daily as needed. For    . CETIRIZINE  HCL 10 MG PO TABS Oral Take 10 mg by mouth daily.      Marland Kitchen GABAPENTIN 300 MG PO CAPS Oral Take 300 mg by mouth 2 (two) times daily.    Marland Kitchen HYDROCODONE-ACETAMINOPHEN 5-325 MG PO TABS Oral Take 1 tablet by mouth 2 (two) times daily. For pain    . IBUPROFEN 800 MG PO TABS Oral Take 800 mg by mouth every 8 (eight) hours as needed. For pain    . OMEPRAZOLE 20 MG PO CPDR Oral Take 20 mg by mouth daily as needed. Heart burn    . SIMVASTATIN 40 MG PO TABS  TAKE 1 TABLET AT BEDTIME 90 tablet 2  . ALBUTEROL SULFATE HFA 108 (90 BASE) MCG/ACT IN AERS Inhalation Inhale 2 puffs into the lungs every 2 (two) hours as needed for wheezing or shortness of breath (cough). 1 Inhaler 0  . PREDNISONE 20 MG PO TABS  3 tabs po day one, then 2 po daily x 4 days 11 tablet 0  . SILDENAFIL CITRATE 100 MG PO TABS Oral Take 1 tablet (100 mg total) by mouth every other day as needed for erectile dysfunction. 10 tablet 11    BP 118/73  Pulse 73  Temp(Src) 97.8 F (36.6 C) (Oral)  Resp 16  Ht 5\' 6"  (1.676 m)  Wt 185 lb (83.915 kg)  BMI 29.86 kg/m2  SpO2 99%  Physical Exam  Nursing note and vitals reviewed. Constitutional: He  is oriented to person, place, and time. He appears well-developed and well-nourished. No distress.       Awake, alert, nontoxic appearance.  HENT:  Head: Normocephalic and atraumatic.  Eyes: Right eye exhibits no discharge. Left eye exhibits no discharge.  Neck: Neck supple. No tracheal deviation present.  Cardiovascular: Normal rate and regular rhythm.   No murmur heard. Pulmonary/Chest: Effort normal. No respiratory distress. He has no wheezes. He has no rales. He exhibits no tenderness.       Good air movement. Diffuse expiratory wheezes only with forced expiration not appreciated when breathing normally. No retractions. Speaking in full sentences.   Abdominal: Soft. Bowel sounds are normal. He exhibits no distension. There is no tenderness. There is no rebound.  Musculoskeletal: He exhibits no  edema and no tenderness.       Baseline ROM, no obvious new focal weakness. Diffuse back non-tender.   Neurological: He is alert and oriented to person, place, and time.       Mental status and motor strength appears baseline for patient and situation.  Skin: No rash noted.  Psychiatric: He has a normal mood and affect.    ED Course  Procedures (including critical care time) ECG: Normal sinus rhythm, ventricular rate 85, normal axis, normal intervals, no acute ischemic changes noted, very jittery 2012 patient is no longer in atrial fibrillation DIAGNOSTIC STUDIES: Oxygen Saturation is 99% on room air, normal by my interpretation.    COORDINATION OF CARE: 12:26PM- Patient informed of current plan for treatment and evaluation and agrees with plan at this time.       Labs Reviewed  POCT I-STAT TROPONIN I   Dg Chest 2 View  07/19/2011  *RADIOLOGY REPORT*  Clinical Data: 66 year old male with cough and congestion.  CHEST - 2 VIEW  Comparison: 01/09/2011 and earlier.  Findings: Chronic right lateral rib fractures.  Stable lung volumes.  Cardiac size and mediastinal contours are within normal limits.  Visualized tracheal air column is within normal limits. No pneumothorax, pulmonary edema, pleural effusion or confluent pulmonary opacity. No acute osseous abnormality identified.  IMPRESSION: No acute cardiopulmonary abnormality.  Original Report Authenticated By: Harley Hallmark, M.D.     1. Acute bronchitis with bronchospasm       MDM  Patient / Family / Caregiver understand and agree with initial ED impression and plan with expectations set for ED visit. Pt stable in ED with no significant deterioration in condition. Patient / Family / Caregiver informed of clinical course, understand medical decision-making process, and agree with plan. I doubt any other EMC precluding discharge at this time including, but not necessarily limited to the following:CAP, SBI, ACS.      I personally  performed the services described in this documentation, which was scribed in my presence. The recorded information has been reviewed and considered.    Hurman Horn, MD 07/19/11 2217

## 2011-07-19 NOTE — ED Notes (Signed)
MD at bedside. 

## 2011-07-27 ENCOUNTER — Ambulatory Visit: Payer: Medicare Other | Admitting: Family

## 2011-08-02 ENCOUNTER — Ambulatory Visit: Payer: Medicare Other | Admitting: Family

## 2011-08-02 ENCOUNTER — Inpatient Hospital Stay (HOSPITAL_COMMUNITY)
Admission: EM | Admit: 2011-08-02 | Discharge: 2011-08-03 | DRG: 605 | Disposition: A | Discharge status: Home / Self Care | Payer: Medicare Other | Attending: Internal Medicine | Admitting: Internal Medicine

## 2011-08-02 ENCOUNTER — Emergency Department (HOSPITAL_COMMUNITY): Payer: Medicare Other

## 2011-08-02 ENCOUNTER — Encounter (HOSPITAL_COMMUNITY): Payer: Self-pay

## 2011-08-02 DIAGNOSIS — T07XXXA Unspecified multiple injuries, initial encounter: Principal | ICD-10-CM | POA: Diagnosis present

## 2011-08-02 DIAGNOSIS — S0990XA Unspecified injury of head, initial encounter: Secondary | ICD-10-CM | POA: Diagnosis present

## 2011-08-02 DIAGNOSIS — R5381 Other malaise: Secondary | ICD-10-CM | POA: Diagnosis present

## 2011-08-02 DIAGNOSIS — I252 Old myocardial infarction: Secondary | ICD-10-CM

## 2011-08-02 DIAGNOSIS — I350 Nonrheumatic aortic (valve) stenosis: Secondary | ICD-10-CM

## 2011-08-02 DIAGNOSIS — Z96649 Presence of unspecified artificial hip joint: Secondary | ICD-10-CM

## 2011-08-02 DIAGNOSIS — K7030 Alcoholic cirrhosis of liver without ascites: Secondary | ICD-10-CM | POA: Diagnosis present

## 2011-08-02 DIAGNOSIS — D6959 Other secondary thrombocytopenia: Secondary | ICD-10-CM | POA: Diagnosis present

## 2011-08-02 DIAGNOSIS — F1020 Alcohol dependence, uncomplicated: Secondary | ICD-10-CM | POA: Diagnosis present

## 2011-08-02 DIAGNOSIS — W19XXXA Unspecified fall, initial encounter: Secondary | ICD-10-CM | POA: Diagnosis present

## 2011-08-02 DIAGNOSIS — R9431 Abnormal electrocardiogram [ECG] [EKG]: Secondary | ICD-10-CM | POA: Diagnosis present

## 2011-08-02 DIAGNOSIS — R748 Abnormal levels of other serum enzymes: Secondary | ICD-10-CM | POA: Diagnosis present

## 2011-08-02 DIAGNOSIS — I2510 Atherosclerotic heart disease of native coronary artery without angina pectoris: Secondary | ICD-10-CM

## 2011-08-02 DIAGNOSIS — W1809XA Striking against other object with subsequent fall, initial encounter: Secondary | ICD-10-CM | POA: Diagnosis present

## 2011-08-02 DIAGNOSIS — R531 Weakness: Secondary | ICD-10-CM | POA: Diagnosis present

## 2011-08-02 DIAGNOSIS — Y999 Unspecified external cause status: Secondary | ICD-10-CM

## 2011-08-02 DIAGNOSIS — R0600 Dyspnea, unspecified: Secondary | ICD-10-CM | POA: Insufficient documentation

## 2011-08-02 DIAGNOSIS — I359 Nonrheumatic aortic valve disorder, unspecified: Secondary | ICD-10-CM | POA: Diagnosis present

## 2011-08-02 DIAGNOSIS — Z79899 Other long term (current) drug therapy: Secondary | ICD-10-CM

## 2011-08-02 DIAGNOSIS — Y92009 Unspecified place in unspecified non-institutional (private) residence as the place of occurrence of the external cause: Secondary | ICD-10-CM

## 2011-08-02 DIAGNOSIS — E871 Hypo-osmolality and hyponatremia: Secondary | ICD-10-CM | POA: Diagnosis present

## 2011-08-02 DIAGNOSIS — Z0289 Encounter for other administrative examinations: Secondary | ICD-10-CM

## 2011-08-02 DIAGNOSIS — D696 Thrombocytopenia, unspecified: Secondary | ICD-10-CM | POA: Diagnosis present

## 2011-08-02 DIAGNOSIS — E785 Hyperlipidemia, unspecified: Secondary | ICD-10-CM | POA: Diagnosis present

## 2011-08-02 DIAGNOSIS — M109 Gout, unspecified: Secondary | ICD-10-CM | POA: Diagnosis present

## 2011-08-02 DIAGNOSIS — I679 Cerebrovascular disease, unspecified: Secondary | ICD-10-CM

## 2011-08-02 DIAGNOSIS — K219 Gastro-esophageal reflux disease without esophagitis: Secondary | ICD-10-CM | POA: Diagnosis present

## 2011-08-02 LAB — COMPREHENSIVE METABOLIC PANEL
ALT: 30 U/L (ref 0–53)
CO2: 22 mEq/L (ref 19–32)
Calcium: 8.9 mg/dL (ref 8.4–10.5)
Chloride: 94 mEq/L — ABNORMAL LOW (ref 96–112)
Creatinine, Ser: 0.92 mg/dL (ref 0.50–1.35)
GFR calc Af Amer: >90 mL/min (ref >90)
GFR calc non Af Amer: 85 mL/min — ABNORMAL LOW (ref >90)
Glucose, Bld: 87 mg/dL (ref 70–99)
Sodium: 129 mEq/L — ABNORMAL LOW (ref 135–145)
Total Bilirubin: 0.4 mg/dL (ref 0.3–1.2)

## 2011-08-02 LAB — CARDIAC PANEL(CRET KIN+CKTOT+MB+TROPI)
Relative Index: 0.9 (ref 0.0–2.5)
Relative Index: 0.8 (ref 0.0–2.5)
Total CK: 899 U/L — ABNORMAL HIGH (ref 7–232)
Troponin I: <0.3 ng/mL (ref <0.30)

## 2011-08-02 LAB — DIFFERENTIAL
Basophils Absolute: 0 10*3/uL (ref 0.0–0.1)
Basophils Relative: 1 % (ref 0–1)
Eosinophils Absolute: 0.3 10*3/uL (ref 0.0–0.7)
Monocytes Relative: 10 % (ref 3–12)
Neutro Abs: 2.9 10*3/uL (ref 1.7–7.7)
Neutrophils Relative %: 56 % (ref 43–77)

## 2011-08-02 LAB — CBC
Hemoglobin: 12.6 g/dL — ABNORMAL LOW (ref 13.0–17.0)
MCH: 31.5 pg (ref 26.0–34.0)
MCHC: 35.1 g/dL (ref 30.0–36.0)
Platelets: 100 10*3/uL — ABNORMAL LOW (ref 150–400)

## 2011-08-02 LAB — POCT I-STAT TROPONIN I: Troponin i, poc: 0.1 ng/mL (ref 0.00–0.08)

## 2011-08-02 MED ORDER — HEPARIN BOLUS VIA INFUSION
2000.0000 [IU] | Freq: Once | INTRAVENOUS | Status: AC
  Administered 2011-08-02: 2000 [IU] via INTRAVENOUS
  Filled 2011-08-02: qty 2000

## 2011-08-02 MED ORDER — ACETAMINOPHEN 325 MG PO TABS: 650.0000 mg | ORAL_TABLET | Freq: Four times a day (QID) | ORAL | Status: DC | PRN

## 2011-08-02 MED ORDER — PNEUMOCOCCAL VAC POLYVALENT 25 MCG/0.5ML IJ INJ
0.5000 mL | INJECTION | INTRAMUSCULAR | Status: DC
  Filled 2011-08-02: qty 0.5

## 2011-08-02 MED ORDER — HYDROCODONE-ACETAMINOPHEN 5-325 MG PO TABS
1.0000 | ORAL_TABLET | Freq: Two times a day (BID) | ORAL | Status: DC
  Administered 2011-08-02: 1 via ORAL
  Filled 2011-08-02: qty 1

## 2011-08-02 MED ORDER — ACETAMINOPHEN 650 MG RE SUPP: 650.0000 mg | Freq: Four times a day (QID) | RECTAL | Status: DC | PRN

## 2011-08-02 MED ORDER — PANTOPRAZOLE SODIUM 40 MG PO TBEC
40.0000 mg | DELAYED_RELEASE_TABLET | Freq: Every day | ORAL | Status: DC
  Administered 2011-08-02 – 2011-08-03 (×2): 40 mg via ORAL
  Filled 2011-08-02 (×2): qty 1

## 2011-08-02 MED ORDER — METOPROLOL TARTRATE 25 MG PO TABS
12.5000 mg | ORAL_TABLET | Freq: Two times a day (BID) | ORAL | Status: DC
  Administered 2011-08-02 – 2011-08-03 (×2): 12.5 mg via ORAL
  Filled 2011-08-02 (×2): qty 1

## 2011-08-02 MED ORDER — FOLIC ACID 5 MG/ML IJ SOLN
INTRAMUSCULAR | Status: AC
  Filled 2011-08-02: qty 0.2

## 2011-08-02 MED ORDER — HEPARIN (PORCINE) IN NACL 100-0.45 UNIT/ML-% IJ SOLN
15.0000 [IU]/kg/h | INTRAMUSCULAR | Status: DC
  Administered 2011-08-02: 15 [IU]/kg/h via INTRAVENOUS
  Filled 2011-08-02: qty 250

## 2011-08-02 MED ORDER — SODIUM CHLORIDE 0.9 % IV BOLUS (SEPSIS): 1000.0000 mL | Freq: Once | INTRAVENOUS | Status: DC

## 2011-08-02 MED ORDER — ASPIRIN EC 325 MG PO TBEC
325.0000 mg | DELAYED_RELEASE_TABLET | Freq: Every day | ORAL | Status: DC
  Administered 2011-08-03: 325 mg via ORAL
  Filled 2011-08-02: qty 1

## 2011-08-02 MED ORDER — ONDANSETRON HCL 4 MG PO TABS: 4.0000 mg | ORAL_TABLET | Freq: Four times a day (QID) | ORAL | Status: DC | PRN

## 2011-08-02 MED ORDER — ONDANSETRON HCL 4 MG/2ML IJ SOLN: 4.0000 mg | Freq: Four times a day (QID) | INTRAMUSCULAR | Status: DC | PRN

## 2011-08-02 MED ORDER — SIMVASTATIN 20 MG PO TABS
40.0000 mg | ORAL_TABLET | Freq: Every day | ORAL | Status: DC
Start: 2011-08-02 — End: 2011-08-03
  Administered 2011-08-02 – 2011-08-03 (×2): 40 mg via ORAL
  Filled 2011-08-02 (×2): qty 2

## 2011-08-02 MED ORDER — THIAMINE HCL 100 MG/ML IJ SOLN
INTRAMUSCULAR | Status: AC
  Filled 2011-08-02: qty 2

## 2011-08-02 MED ORDER — ALBUTEROL SULFATE (5 MG/ML) 0.5% IN NEBU: 5.0000 mg | INHALATION_SOLUTION | Freq: Once | RESPIRATORY_TRACT | Status: DC

## 2011-08-02 MED ORDER — SODIUM CHLORIDE 0.9 % IV SOLN
INTRAVENOUS | Status: AC
  Administered 2011-08-02: 12:00:00 via INTRAVENOUS

## 2011-08-02 MED ORDER — ALBUTEROL SULFATE (5 MG/ML) 0.5% IN NEBU
INHALATION_SOLUTION | RESPIRATORY_TRACT | Status: AC
  Filled 2011-08-02: qty 1

## 2011-08-02 MED ORDER — BIOTENE DRY MOUTH MT LIQD: 15.0000 mL | Freq: Two times a day (BID) | OROMUCOSAL | Status: DC

## 2011-08-02 MED ORDER — SODIUM CHLORIDE 0.9 % IJ SOLN
INTRAMUSCULAR | Status: AC
  Administered 2011-08-02: 18:00:00
  Filled 2011-08-02: qty 3

## 2011-08-02 MED ORDER — M.V.I. ADULT IV INJ
INJECTION | INTRAVENOUS | Status: AC
  Filled 2011-08-02: qty 10

## 2011-08-02 MED ORDER — ASPIRIN 81 MG PO CHEW
324.0000 mg | CHEWABLE_TABLET | Freq: Once | ORAL | Status: AC
  Administered 2011-08-02: 324 mg via ORAL
  Filled 2011-08-02: qty 4

## 2011-08-02 MED ORDER — ALPRAZOLAM 1 MG PO TABS
1.0000 mg | ORAL_TABLET | Freq: Three times a day (TID) | ORAL | Status: DC | PRN
  Administered 2011-08-02: 1 mg via ORAL
  Filled 2011-08-02: qty 1

## 2011-08-02 MED ORDER — ALBUTEROL SULFATE HFA 108 (90 BASE) MCG/ACT IN AERS: 2.0000 | INHALATION_SPRAY | RESPIRATORY_TRACT | Status: DC | PRN

## 2011-08-02 MED ORDER — THIAMINE HCL 100 MG/ML IJ SOLN
Freq: Once | INTRAVENOUS | Status: AC
  Administered 2011-08-02: 21:00:00 via INTRAVENOUS
  Filled 2011-08-02: qty 1000

## 2011-08-02 MED ORDER — GABAPENTIN 300 MG PO CAPS
300.0000 mg | ORAL_CAPSULE | Freq: Two times a day (BID) | ORAL | Status: DC
  Administered 2011-08-02 – 2011-08-03 (×2): 300 mg via ORAL
  Filled 2011-08-02 (×2): qty 1

## 2011-08-02 NOTE — ED Notes (Signed)
Per ems, pt's son called them b/c pt had fallen and reported some sob.  Per ems upon arrival pt was in no respiratory distress.  Pt reports being an alcoholic and has been drinking heavily for the past 2 weeks.  Pt reports falling yesterday as well.  Pt has bruising to the right side of his back and to his face.  Pt presents with slurred speech and difficulty answering questions.

## 2011-08-02 NOTE — H&P (Signed)
PCP:   Judie Petit, MD, MD   Chief Complaint:  Fall, weakness  HPI: This is a 66 year old gentleman with history of alcohol abuse, hyperlipidemia. Patient presents to the emergency room today after having fallen. He reports having fallen multiple times over the past few days. He denies any loss of consciousness. He has been feeling generally weak for the past few weeks. He denies any chest pain, lightheadedness, nausea, diaphoresis. On my questioning he denies any shortness of breath but had reported being short of breath and the ER physician prior to arrival. He reports that his main complaint is weakness right now. He denies any cardiac history. He reports having a stress test done approximately 10 years ago. He denies any fever, cough, dysuria, abdominal pain. He was evaluated in the emergency room where he was noted to have elevated creatine kinase as well as a point-of-care troponin. Patient has been referred for admission.  Allergies:   Allergies  Allergen Reactions  . Atorvastatin Anaphylaxis    facial swelling      Past Medical History  Diagnosis Date  . Allergy   . Gout   . Hyperlipidemia   . EtOH dependence   . Hemothorax on right     Past Surgical History  Procedure Date  . Appendectomy   . Hernia repair   . Tonsilectomy, adenoidectomy, bilateral myringotomy and tubes   . 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 Partial hip arthroplasty 2010    left    Prior to Admission medications   Medication Sig Start Date End Date Taking? Authorizing Provider  albuterol (PROVENTIL HFA;VENTOLIN HFA) 108 (90 BASE) MCG/ACT inhaler Inhale 2 puffs into the lungs every 2 (two) hours as needed for wheezing or shortness of breath (cough). 07/19/11 07/18/12 Yes Hurman Horn, MD  ALPRAZolam Prudy Feeler) 1 MG tablet Take 1 mg by mouth 3 (three) times daily as needed. For anxiety 06/20/11  Yes Bruce Romilda Garret, MD  cetirizine (ZYRTEC) 10 MG tablet Take 10 mg by mouth daily.     Yes  Historical Provider, MD  gabapentin (NEURONTIN) 300 MG capsule Take 300 mg by mouth 2 (two) times daily.   Yes Historical Provider, MD  HYDROcodone-acetaminophen (NORCO) 5-325 MG per tablet Take 1 tablet by mouth 2 (two) times daily. For pain   Yes Historical Provider, MD  ibuprofen (ADVIL,MOTRIN) 800 MG tablet Take 800 mg by mouth every 8 (eight) hours as needed. For pain   Yes Historical Provider, MD  nystatin cream (MYCOSTATIN) Apply 1 application topically 2 (two) times daily as needed. For rash   Yes Historical Provider, MD  omeprazole (PRILOSEC) 20 MG capsule Take 20 mg by mouth daily as needed. Heart burn   Yes Historical Provider, MD  sildenafil (VIAGRA) 100 MG tablet Take 1 tablet (100 mg total) by mouth every other day as needed for erectile dysfunction. 03/30/11 03/29/12 Yes Bruce Romilda Garret, MD  simvastatin (ZOCOR) 40 MG tablet TAKE 1 TABLET AT BEDTIME 11/22/10  Yes Lindley Magnus, MD    Social History:  reports that he quit smoking about 42 years ago. He has never used smokeless tobacco. He reports that he drinks alcohol. He reports that he does not use illicit drugs.  Family History  Problem Relation Age of Onset  . Arthritis Mother     Review of Systems: Positives in bold Constitutional: Denies fever, chills, diaphoresis, appetite change and fatigue.  HEENT: Denies photophobia, eye pain, redness, hearing loss, ear pain, congestion, sore throat, rhinorrhea, sneezing, mouth  sores, trouble swallowing, neck pain, neck stiffness and tinnitus.   Respiratory: Denies SOB, DOE, cough, chest tightness,  and wheezing.   Cardiovascular: Denies chest pain, palpitations and leg swelling.  Gastrointestinal: Denies nausea, vomiting, abdominal pain, diarrhea, constipation, blood in stool and abdominal distention.  Genitourinary: Denies dysuria, urgency, frequency, hematuria, flank pain and difficulty urinating.  Musculoskeletal: Denies myalgias, back pain, joint swelling, arthralgias and gait  problem.  Skin: Denies pallor, rash and wound.  Neurological: Denies dizziness, seizures, syncope, weakness, light-headedness, numbness and headaches.  Hematological: Denies adenopathy. Easy bruising, personal or family bleeding history  Psychiatric/Behavioral: Denies suicidal ideation, mood changes, confusion, nervousness, sleep disturbance and agitation   Physical Exam: Blood pressure 129/85, pulse 86, temperature 97.9 F (36.6 C), temperature source Oral, resp. rate 16, height 5\' 6"  (1.676 m), weight 88.9 kg (195 lb 15.8 oz), SpO2 99.00%. General: Patient is lying in bed, does not appear to be in any acute distress. Alert and oriented x3 HEENT: Normocephalic, he does have a small abrasion on his forehead where he had fallen, pupils are equal and react to light Neck: Supple Chest: Clear to auscultation bilaterally Cardiac: S1, S2, regular rate and rhythm Abdomen: Soft, nontender, nondistended, bowel sounds are active Extremities: No cyanosis, clubbing, or edema Neurologic: Grossly intact, nonfocal Skin: Warm, no visible rashes  Labs on Admission:  Results for orders placed during the hospital encounter of 08/02/11 (from the past 48 hour(s))  COMPREHENSIVE METABOLIC PANEL     Status: Abnormal   Collection Time   08/02/11  9:43 AM      Component Value Range Comment   Sodium 129 (*) 135 - 145 (mEq/L)    Potassium 3.7  3.5 - 5.1 (mEq/L)    Chloride 94 (*) 96 - 112 (mEq/L)    CO2 22  19 - 32 (mEq/L)    Glucose, Bld 87  70 - 99 (mg/dL)    BUN 9  6 - 23 (mg/dL)    Creatinine, Ser 7.82  0.50 - 1.35 (mg/dL)    Calcium 8.9  8.4 - 10.5 (mg/dL)    Total Protein 6.4  6.0 - 8.3 (g/dL)    Albumin 3.5  3.5 - 5.2 (g/dL)    AST 49 (*) 0 - 37 (U/L)    ALT 30  0 - 53 (U/L)    Alkaline Phosphatase 38 (*) 39 - 117 (U/L)    Total Bilirubin 0.4  0.3 - 1.2 (mg/dL)    GFR calc non Af Amer 85 (*) >90 (mL/min)    GFR calc Af Amer >90  >90 (mL/min)   CBC     Status: Abnormal   Collection Time    08/02/11  9:43 AM      Component Value Range Comment   WBC 5.2  4.0 - 10.5 (K/uL)    RBC 4.00 (*) 4.22 - 5.81 (MIL/uL)    Hemoglobin 12.6 (*) 13.0 - 17.0 (g/dL)    HCT 95.6 (*) 21.3 - 52.0 (%)    MCV 89.8  78.0 - 100.0 (fL)    MCH 31.5  26.0 - 34.0 (pg)    MCHC 35.1  30.0 - 36.0 (g/dL)    RDW 08.6  57.8 - 46.9 (%)    Platelets 100 (*) 150 - 400 (K/uL)   DIFFERENTIAL     Status: Normal   Collection Time   08/02/11  9:43 AM      Component Value Range Comment   Neutrophils Relative 56  43 - 77 (%)  Neutro Abs 2.9  1.7 - 7.7 (K/uL)    Lymphocytes Relative 28  12 - 46 (%)    Lymphs Abs 1.4  0.7 - 4.0 (K/uL)    Monocytes Relative 10  3 - 12 (%)    Monocytes Absolute 0.5  0.1 - 1.0 (K/uL)    Eosinophils Relative 5  0 - 5 (%)    Eosinophils Absolute 0.3  0.0 - 0.7 (K/uL)    Basophils Relative 1  0 - 1 (%)    Basophils Absolute 0.0  0.0 - 0.1 (K/uL)   CARDIAC PANEL(CRET KIN+CKTOT+MB+TROPI)     Status: Abnormal   Collection Time   08/02/11  9:43 AM      Component Value Range Comment   Total CK 899 (*) 7 - 232 (U/L)    CK, MB 8.4 (*) 0.3 - 4.0 (ng/mL)    Troponin I <0.30  <0.30 (ng/mL)    Relative Index 0.9  0.0 - 2.5    POCT I-STAT TROPONIN I     Status: Abnormal   Collection Time   08/02/11 10:03 AM      Component Value Range Comment   Troponin i, poc 0.10 (*) 0.00 - 0.08 (ng/mL)    Comment 3              Radiological Exams on Admission: Dg Chest 2 View  08/02/2011  *RADIOLOGY REPORT*  Clinical Data: Shortness of breath.  CHEST - 2 VIEW  Comparison: 07/19/2011  Findings: Borderline heart size.  No effusions or edema.  Scarring at the right lung base.  Left lung is clear.  No acute bony abnormality.  IMPRESSION: No acute cardiopulmonary disease.  Original Report Authenticated By: Cyndie Chime, M.D.   Ct Head Wo Contrast  08/02/2011  *RADIOLOGY REPORT*  Clinical Data: Fall.  CT HEAD WITHOUT CONTRAST  Technique:  Contiguous axial images were obtained from the base of the skull  through the vertex without contrast.  Comparison: 01/09/2011  Findings: Mild cerebral atrophy. No acute intracranial abnormality. Specifically, no hemorrhage, hydrocephalus, mass lesion, acute infarction, or significant intracranial injury.  No acute calvarial abnormality.  Mucosal thickening throughout the paranasal sinuses.  No air fluid levels.  Mastoids are clear.  IMPRESSION: No acute intracranial abnormality.  Chronic sinusitis.  Original Report Authenticated By: Cyndie Chime, M.D.    Assessment/Plan Principal Problem:  *Generalized weakness Active Problems:  HYPERLIPIDEMIA  DEPENDENCE, ALCOHOL NEC/NOS, UNSPECIFIED  GERD  Cardiac enzymes elevated  Fall  Thrombocytopenia  Hyponatremia  Plan:   #1. Elevated cardiac enzymes. Patient is noted to have elevated creatine kinases and POC troponin is also mildly elevated. Review of his EKG shows new T wave inversions in lead III compared to EKG done approximately a week ago. Patient will be started on aspirin, beta blocker, continued on his statin. We will also start the patient empirically on heparin. We will request a cardiology consultation to see whether the patient requires further cardiac testing. We will cycle his cardiac markers, check a 2-D echocardiogram, and EKG in the morning.  #2. History of alcohol abuse. Patient reports he is a binge shrinker. He can drink approximately 12 cans of beer at a time. Here for his last drink was the day before yesterday. He often has episodes of tremors and signs of withdrawal when he stops drinking. He'll be placed on Ativan protocol.  #3. Thrombocytopenia. Likely secondary to alcohol abuse. Continue to follow.  #4. Generalized weakness and falls. Likely secondary to deconditioning. We will as  physical therapy see him.  Further orders per the clinical course.   Time Spent on Admission:  , [facility] Pager: [phone] 08/02/2011, 6:37 PM

## 2011-08-02 NOTE — ED Notes (Signed)
   CRITICAL VALUE ALERT  Critical value received:  ckmb 8.42  Date of notification:  08/02/2011  Time of notification:  1115  Critical value read back:YES  Nurse who received alert: Garrison Columbus, RN  MD notified (1st page):  Dr. Rennis Chris  Time of first page:  1117  MD notified (2nd page):  Time of second page:  Responding MD: Dr. Shela Commons  Time MD responded:1117

## 2011-08-02 NOTE — ED Notes (Signed)
Gave pt lunch tray 

## 2011-08-02 NOTE — ED Notes (Signed)
Pt denies LOC when he hit his head yesterday.  Pt reports "i fell b/c i was intoxicated".

## 2011-08-02 NOTE — ED Provider Notes (Signed)
History    This chart was scribed for Doug Sou, MD, MD by Smitty Pluck. The patient was seen in room APA01 and the patient's care was started at 9:04AM.   CSN: [id number]  Arrival date & time 08/02/11  2440   First MD Initiated Contact with Patient 08/02/11 [phone]      Chief Complaint  Patient presents with  . Fall    (Consider location/radiation/quality/duration/timing/severity/associated sxs/prior treatment) The history is provided by the patient.   Victor Diaz is a 66 y.o. male who presents to the Emergency Department BIB EMS due to fall onset 1 day ago and moderate SOB onset today. Pt reports that his breathing is back to normal upon arrival to ED. Pt reports falling 1 day ago and hitting head. Denies LOC. Pt reports that he was drinking alcohol when he fell. Pt reports consuming alcohol for past 2 days. Denies cough. Pt has hx of alcohol consumption. Denies smoking. Has hyperlipidemia.  Pt takes albuterol and hydrocodone (hip surgery). Last tetanus shot was within last 10 years. There is no radiation of pain. No pain presently. No dyspnea. No other associated symptoms no neck pain PCP Dr. Timoteo Gaul   Past Medical History  Diagnosis Date  . Allergy   . Gout   . Hyperlipidemia   . EtOH dependence   . Hemothorax on right     Past Surgical History  Procedure Date  . Appendectomy   . Hernia repair   . Tonsilectomy, adenoidectomy, bilateral myringotomy and tubes   . 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 Partial hip arthroplasty 2010    left    Family History  Problem Relation Age of Onset  . Arthritis Mother     History  Substance Use Topics  . Smoking status: Former Smoker -- 1.0 packs/day for 10 years    Quit date: 08/09/1969  . Smokeless tobacco: Never Used  . Alcohol Use: Yes      Review of Systems  Constitutional: Negative.   HENT: Negative.   Respiratory: Positive for shortness of breath.   Cardiovascular: Negative.   Gastrointestinal:  Negative.   Musculoskeletal: Negative.   Skin: Negative.   Neurological: Negative.   Hematological: Negative.   Psychiatric/Behavioral: Negative.   All other systems reviewed and are negative.    Allergies  Atorvastatin  Home Medications   Current Outpatient Rx  Name Route Sig Dispense Refill  . ALBUTEROL SULFATE HFA 108 (90 BASE) MCG/ACT IN AERS Inhalation Inhale 2 puffs into the lungs every 2 (two) hours as needed for wheezing or shortness of breath (cough). 1 Inhaler 0  . ALPRAZOLAM 1 MG PO TABS Oral Take 1 mg by mouth 3 (three) times daily as needed. For    . CETIRIZINE HCL 10 MG PO TABS Oral Take 10 mg by mouth daily.      Marland Kitchen GABAPENTIN 300 MG PO CAPS Oral Take 300 mg by mouth 2 (two) times daily.    Marland Kitchen HYDROCODONE-ACETAMINOPHEN 5-325 MG PO TABS Oral Take 1 tablet by mouth 2 (two) times daily. For pain    . IBUPROFEN 800 MG PO TABS Oral Take 800 mg by mouth every 8 (eight) hours as needed. For pain    . OMEPRAZOLE 20 MG PO CPDR Oral Take 20 mg by mouth daily as needed. Heart burn    . SILDENAFIL CITRATE 100 MG PO TABS Oral Take 1 tablet (100 mg total) by mouth every other day as needed for erectile dysfunction. 10 tablet 11  . SIMVASTATIN  40 MG PO TABS  TAKE 1 TABLET AT BEDTIME 90 tablet 2    BP 117/81  Pulse 87  Temp(Src) 97.5 F (36.4 C) (Oral)  Resp 21  Ht 5\' 6"  (1.676 m)  Wt 185 lb (83.915 kg)  BMI 29.86 kg/m2  SpO2 95%  Physical Exam  Nursing note and vitals reviewed. Constitutional: He appears well-developed and well-nourished. No distress.  HENT:  Head: Normocephalic and atraumatic.       Dime size scab lesion at center of forehead   Eyes: Conjunctivae are normal. Pupils are equal, round, and reactive to light.  Neck: Neck supple. No tracheal deviation present. No thyromegaly present.  Cardiovascular: Normal rate, regular rhythm and normal heart sounds.   No murmur heard. Pulmonary/Chest: Effort normal and breath sounds normal. No respiratory distress.    Abdominal: Soft. Bowel sounds are normal. He exhibits no distension. There is no tenderness.  Musculoskeletal: Normal range of motion. He exhibits no edema and no tenderness.  Neurological: He is alert. Coordination normal.       gait is normal   Skin: Skin is warm and dry. No rash noted.       Abrasion at right flank and right posterior chest   Psychiatric: He has a normal mood and affect. His behavior is normal.    ED Course  Procedures (including critical care time) DIAGNOSTIC STUDIES: Oxygen Saturation is 95% on [place], normal by my interpretation.    COORDINATION OF CARE: 9:18AM EDP discusses pt ED treatment with pt.    Labs Reviewed  COMPREHENSIVE METABOLIC PANEL - Abnormal; Notable for the following:    Sodium 129 (*)    Chloride 94 (*)    AST 49 (*)    Alkaline Phosphatase 38 (*)    GFR calc non Af Amer 85 (*)    All other components within normal limits  CBC - Abnormal; Notable for the following:    RBC 4.00 (*)    Hemoglobin 12.6 (*)    HCT 35.9 (*)    Platelets 100 (*)    All other components within normal limits  DIFFERENTIAL   Dg Chest 2 View  08/02/2011  *RADIOLOGY REPORT*  Clinical Data: Shortness of breath.  CHEST - 2 VIEW  Comparison: 07/19/2011  Findings: Borderline heart size.  No effusions or edema.  Scarring at the right lung base.  Left lung is clear.  No acute bony abnormality.  IMPRESSION: No acute cardiopulmonary disease.  Original Report Authenticated By: Cyndie Chime, M.D.   Ct Head Wo Contrast  08/02/2011  *RADIOLOGY REPORT*  Clinical Data: Fall.  CT HEAD WITHOUT CONTRAST  Technique:  Contiguous axial images were obtained from the base of the skull through the vertex without contrast.  Comparison: 01/09/2011  Findings: Mild cerebral atrophy. No acute intracranial abnormality. Specifically, no hemorrhage, hydrocephalus, mass lesion, acute infarction, or significant intracranial injury.  No acute calvarial abnormality.  Mucosal thickening throughout the  paranasal sinuses.  No air fluid levels.  Mastoids are clear.  IMPRESSION: No acute intracranial abnormality.  Chronic sinusitis.  Original Report Authenticated By: Cyndie Chime, M.D.     No diagnosis found.    Date: 08/02/2011  Rate: 85  Rhythm: normal sinus rhythm  QRS Axis: normal  Intervals: normal  ST/T Wave abnormalities: nonspecific T wave changes  Conduction Disutrbances:none  Narrative Interpretation:   Old EKG Reviewed: changes noted Tracing from 07/19/2011 showed normal sinus rhythm, PVCs present on today's tracing, otherwise unchanged Results for orders placed during the  hospital encounter of 08/02/11  COMPREHENSIVE METABOLIC PANEL      Component Value Range   Sodium 129 (*) 135 - 145 (mEq/L)   Potassium 3.7  3.5 - 5.1 (mEq/L)   Chloride 94 (*) 96 - 112 (mEq/L)   CO2 22  19 - 32 (mEq/L)   Glucose, Bld 87  70 - 99 (mg/dL)   BUN 9  6 - 23 (mg/dL)   Creatinine, Ser 9.60  0.50 - 1.35 (mg/dL)   Calcium 8.9  8.4 - 45.4 (mg/dL)   Total Protein 6.4  6.0 - 8.3 (g/dL)   Albumin 3.5  3.5 - 5.2 (g/dL)   AST 49 (*) 0 - 37 (U/L)   ALT 30  0 - 53 (U/L)   Alkaline Phosphatase 38 (*) 39 - 117 (U/L)   Total Bilirubin 0.4  0.3 - 1.2 (mg/dL)   GFR calc non Af Amer 85 (*) >90 (mL/min)   GFR calc Af Amer >90  >90 (mL/min)  CBC      Component Value Range   WBC 5.2  4.0 - 10.5 (K/uL)   RBC 4.00 (*) 4.22 - 5.81 (MIL/uL)   Hemoglobin 12.6 (*) 13.0 - 17.0 (g/dL)   HCT 09.8 (*) 11.9 - 52.0 (%)   MCV 89.8  78.0 - 100.0 (fL)   MCH 31.5  26.0 - 34.0 (pg)   MCHC 35.1  30.0 - 36.0 (g/dL)   RDW 14.7  82.9 - 56.2 (%)   Platelets 100 (*) 150 - 400 (K/uL)  DIFFERENTIAL      Component Value Range   Neutrophils Relative 56  43 - 77 (%)   Neutro Abs 2.9  1.7 - 7.7 (K/uL)   Lymphocytes Relative 28  12 - 46 (%)   Lymphs Abs 1.4  0.7 - 4.0 (K/uL)   Monocytes Relative 10  3 - 12 (%)   Monocytes Absolute 0.5  0.1 - 1.0 (K/uL)   Eosinophils Relative 5  0 - 5 (%)   Eosinophils Absolute 0.3   0.0 - 0.7 (K/uL)   Basophils Relative 1  0 - 1 (%)   Basophils Absolute 0.0  0.0 - 0.1 (K/uL)  POCT I-STAT TROPONIN I      Component Value Range   Troponin i, poc 0.10 (*) 0.00 - 0.08 (ng/mL)   Comment 3            Dg Chest 2 View  08/02/2011  *RADIOLOGY REPORT*  Clinical Data: Shortness of breath.  CHEST - 2 VIEW  Comparison: 07/19/2011  Findings: Borderline heart size.  No effusions or edema.  Scarring at the right lung base.  Left lung is clear.  No acute bony abnormality.  IMPRESSION: No acute cardiopulmonary disease.  Original Report Authenticated By: Cyndie Chime, M.D.   Dg Chest 2 View  07/19/2011  *RADIOLOGY REPORT*  Clinical Data: 66 year old male with cough and congestion.  CHEST - 2 VIEW  Comparison: 01/09/2011 and earlier.  Findings: Chronic right lateral rib fractures.  Stable lung volumes.  Cardiac size and mediastinal contours are within normal limits.  Visualized tracheal air column is within normal limits. No pneumothorax, pulmonary edema, pleural effusion or confluent pulmonary opacity. No acute osseous abnormality identified.  IMPRESSION: No acute cardiopulmonary abnormality.  Original Report Authenticated By: Harley Hallmark, M.D.   Ct Head Wo Contrast  08/02/2011  *RADIOLOGY REPORT*  Clinical Data: Fall.  CT HEAD WITHOUT CONTRAST  Technique:  Contiguous axial images were obtained from the base of the skull through the  vertex without contrast.  Comparison: 01/09/2011  Findings: Mild cerebral atrophy. No acute intracranial abnormality. Specifically, no hemorrhage, hydrocephalus, mass lesion, acute infarction, or significant intracranial injury.  No acute calvarial abnormality.  Mucosal thickening throughout the paranasal sinuses.  No air fluid levels.  Mastoids are clear.  IMPRESSION: No acute intracranial abnormality.  Chronic sinusitis.  Original Report Authenticated By: Cyndie Chime, M.D.    MDM   In light of elevated troponin, and history of dyspnea will place patient on  24-hour observation telemetry unit to rule out acute coronary syndrome,administer aspirin Case discussed with Dr.Memon who is in agreement and accepts patient Patient should be observed for alcohol withdrawal  Diagnosis #1 dyspnea #2 fall with minor closed head injury #3 contusions multiple sites #4 chronic alcohol abuse #5 hyponatremia        Doug Sou, MD 08/02/11 1057

## 2011-08-02 NOTE — Consult Note (Signed)
ANTICOAGULATION CONSULT NOTE - Initial Consult  Pharmacy Consult for heparin Indication: ACS  Allergies  Allergen Reactions  . Atorvastatin Anaphylaxis    facial swelling   Patient Measurements: Height: 5\' 6"  (167.6 cm) Weight: 195 lb 15.8 oz (88.9 kg) (per bed scale) IBW/kg (Calculated) : 63.8  Heparin Dosing Weight: 80  Vital Signs: Temp: 97.9 F (36.6 C) (04/17 1647) Temp src: Oral (04/17 1647) BP: 129/85 mmHg (04/17 1647) Pulse Rate: 86  (04/17 1647)  Labs:  Basename 08/02/11 0943  HGB 12.6*  HCT 35.9*  PLT 100*  APTT --  LABPROT --  INR --  HEPARINUNFRC --  CREATININE 0.92  CKTOTAL 899*  CKMB 8.4*  TROPONINI <0.30   Estimated Creatinine Clearance: 81.3 ml/min (by C-G formula based on Cr of 0.92).  Medical History: Past Medical History  Diagnosis Date  . Allergy   . Gout   . Hyperlipidemia   . EtOH dependence   . Hemothorax on right    Assessment: Thrombocytopenia, most likely from alcohol use Goal of Therapy:  Heparin level 0.3-0.7  Plan: Heparin 2000 units bolus then 15 units/kg/hr infusion Heparin level daily CBC daily  Victor Diaz A 08/02/2011,7:29 PM

## 2011-08-03 ENCOUNTER — Encounter (HOSPITAL_COMMUNITY): Payer: Self-pay | Admitting: Adult Health

## 2011-08-03 ENCOUNTER — Telehealth: Payer: Self-pay | Admitting: Internal Medicine

## 2011-08-03 DIAGNOSIS — R0609 Other forms of dyspnea: Secondary | ICD-10-CM

## 2011-08-03 DIAGNOSIS — I679 Cerebrovascular disease, unspecified: Secondary | ICD-10-CM

## 2011-08-03 DIAGNOSIS — R748 Abnormal levels of other serum enzymes: Secondary | ICD-10-CM

## 2011-08-03 DIAGNOSIS — I359 Nonrheumatic aortic valve disorder, unspecified: Secondary | ICD-10-CM

## 2011-08-03 DIAGNOSIS — I2510 Atherosclerotic heart disease of native coronary artery without angina pectoris: Secondary | ICD-10-CM

## 2011-08-03 DIAGNOSIS — I350 Nonrheumatic aortic (valve) stenosis: Secondary | ICD-10-CM

## 2011-08-03 LAB — CARDIAC PANEL(CRET KIN+CKTOT+MB+TROPI)
CK, MB: 4.2 ng/mL — ABNORMAL HIGH (ref 0.3–4.0)
Relative Index: 0.8 (ref 0.0–2.5)
Total CK: 572 U/L — ABNORMAL HIGH (ref 7–232)
Troponin I: <0.3 ng/mL (ref <0.30)

## 2011-08-03 LAB — BASIC METABOLIC PANEL
BUN: 10 mg/dL (ref 6–23)
Calcium: 9.1 mg/dL (ref 8.4–10.5)
GFR calc non Af Amer: 85 mL/min — ABNORMAL LOW (ref >90)
Glucose, Bld: 104 mg/dL — ABNORMAL HIGH (ref 70–99)
Potassium: 4.1 mEq/L (ref 3.5–5.1)

## 2011-08-03 LAB — CBC
Hemoglobin: 13 g/dL (ref 13.0–17.0)
MCH: 32 pg (ref 26.0–34.0)
MCHC: 35 g/dL (ref 30.0–36.0)

## 2011-08-03 LAB — HEPARIN LEVEL (UNFRACTIONATED): Heparin Unfractionated: 0.75 IU/mL — ABNORMAL HIGH (ref 0.30–0.70)

## 2011-08-03 MED ORDER — VITAMIN B-1 100 MG PO TABS
100.0000 mg | ORAL_TABLET | Freq: Every day | ORAL | Status: DC
  Administered 2011-08-03: 100 mg via ORAL
  Filled 2011-08-03: qty 1

## 2011-08-03 MED ORDER — SODIUM CHLORIDE 0.9 % IJ SOLN
INTRAMUSCULAR | Status: AC
  Administered 2011-08-03: 10 mL
  Filled 2011-08-03: qty 3

## 2011-08-03 MED ORDER — ASPIRIN 81 MG PO TBEC: 81.0000 mg | DELAYED_RELEASE_TABLET | Freq: Every day | ORAL | Status: DC

## 2011-08-03 MED ORDER — PNEUMOCOCCAL VAC POLYVALENT 25 MCG/0.5ML IJ INJ
0.5000 mL | INJECTION | INTRAMUSCULAR | Status: AC
  Administered 2011-08-03: 0.5 mL via INTRAMUSCULAR
  Filled 2011-08-03: qty 0.5

## 2011-08-03 MED ORDER — HEPARIN (PORCINE) IN NACL 100-0.45 UNIT/ML-% IJ SOLN
1300.0000 [IU]/h | INTRAMUSCULAR | Status: DC
Start: 2011-08-03 — End: 2011-08-03

## 2011-08-03 MED ORDER — LORAZEPAM 2 MG/ML IJ SOLN: 0.0000 mg | Freq: Two times a day (BID) | INTRAMUSCULAR | Status: DC

## 2011-08-03 MED ORDER — FOLIC ACID 1 MG PO TABS
1.0000 mg | ORAL_TABLET | Freq: Every day | ORAL | Status: DC
  Administered 2011-08-03: 1 mg via ORAL
  Filled 2011-08-03: qty 1

## 2011-08-03 MED ORDER — LORAZEPAM 2 MG/ML IJ SOLN: 1.0000 mg | Freq: Four times a day (QID) | INTRAMUSCULAR | Status: DC | PRN

## 2011-08-03 MED ORDER — THIAMINE HCL 100 MG/ML IJ SOLN: 100.0000 mg | Freq: Every day | INTRAMUSCULAR | Status: DC

## 2011-08-03 MED ORDER — ADULT MULTIVITAMIN W/MINERALS CH
1.0000 | ORAL_TABLET | Freq: Every day | ORAL | Status: DC
  Administered 2011-08-03: 12:00:00 via ORAL
  Filled 2011-08-03: qty 1

## 2011-08-03 MED ORDER — LORAZEPAM 1 MG PO TABS: 1.0000 mg | ORAL_TABLET | Freq: Four times a day (QID) | ORAL | Status: DC | PRN

## 2011-08-03 MED ORDER — LORAZEPAM 2 MG/ML IJ SOLN
0.0000 mg | Freq: Four times a day (QID) | INTRAMUSCULAR | Status: DC
  Administered 2011-08-03: 1 mg via INTRAVENOUS
  Filled 2011-08-03: qty 1

## 2011-08-03 NOTE — Consult Note (Signed)
ANTICOAGULATION CONSULT NOTE - Follow Up Consult  Pharmacy Consult for Heparin Indication: chest pain/ACS  Allergies  Allergen Reactions  . Atorvastatin Anaphylaxis    facial swelling   Patient Measurements: Height: 5\' 6"  (167.6 cm) Weight: 195 lb 15.8 oz (88.9 kg) (per bed scale) IBW/kg (Calculated) : 63.8   Vital Signs: Temp: 97.4 F (36.3 C) (04/18 0450) BP: 162/90 mmHg (04/18 0450) Pulse Rate: 79  (04/18 0759)  Labs:  Basename 08/03/11 0239 08/03/11 0235 08/02/11 1856 08/02/11 0943  HGB 13.0 -- -- 12.6*  HCT 37.1* -- -- 35.9*  PLT 97* -- -- 100*  APTT -- -- -- --  LABPROT -- -- -- --  INR -- -- -- --  HEPARINUNFRC 0.75* -- -- --  CREATININE 0.93 -- -- 0.92  CKTOTAL -- 572* 876* 899*  CKMB -- 4.2* 6.7* 8.4*  TROPONINI -- <0.30 <0.30 <0.30   Estimated Creatinine Clearance: 80.5 ml/min (by C-G formula based on Cr of 0.93).  Medications:  Scheduled:    . sodium chloride   Intravenous STAT  . aspirin  324 mg Oral Once  . aspirin EC  325 mg Oral Daily  . gabapentin  300 mg Oral BID  . heparin  2,000 Units Intravenous Once  . HYDROcodone-acetaminophen  1 tablet Oral BID  . metoprolol tartrate  12.5 mg Oral BID  . pantoprazole  40 mg Oral Q1200  . simvastatin  40 mg Oral q1800  . general admission iv infusion   Intravenous Once  . sodium chloride      . DISCONTD: albuterol  5 mg Nebulization Once  . DISCONTD: antiseptic oral rinse  15 mL Mouth Rinse BID  . DISCONTD: pneumococcal 23 valent vaccine  0.5 mL Intramuscular Tomorrow-1000  . DISCONTD: sodium chloride  1,000 mL Intravenous Once   Assessment: Heparin level slightly above goal Platelets down slightly (not heparin induced)  Goal of Therapy:  Heparin level 0.3-0.7 units/ml   Plan: Reduce heparin to 1300 units/hr Re-check level in 6 hours Heparin level and CBC daily  Valrie Hart A 08/03/2011,8:26 AM

## 2011-08-03 NOTE — Consult Note (Signed)
CARDIOLOGY CONSULT NOTE  Patient ID: Victor Diaz MRN: [id number] DOB/AGE: 68/12/1945 68 y.o.  Admit date: 08/02/2011 Referring Physician: PTH Primary Audree Camel, MD, MD Primary Cardiologist; (New) Worley Radermacher Reason for Consultation: Abnormal EKG, Positive CK-MB    Principal Problem:  *Generalized weakness Active Problems:  HYPERLIPIDEMIA  DEPENDENCE, ALCOHOL NEC/NOS, UNSPECIFIED  GERD  Cardiac enzymes elevated  Fall  Thrombocytopenia  Hyponatremia  HPI:  Victor Diaz is a 66 year old male patient with no prior cardiac history or cardiac workup who presented to the emergency room after a falling at his home. The patient states that he had been drinking heavily and was trying to go to bed but fell before he got to the bed. Sustained a laceration to his left lower lip and left temple. He denies any loss of consciousness. He states that he has fallen in the past when he was drinking heavily. In the past. He fell and broke his right ribs, sustaining a minimal hematoma that had to be evacuated. He admits to drinking several beers a day 10-12 with binge drinking. He, states he has been feeling weak and tired , with associated shortness of breath. The last several days to weeks, but he has not been eating regularly, preferring to drink alcohol.   Labs in the emergency room demonstrated elevated CK-MB of 6.7 with an elevated CK of 876. Troponins were found to be negative x2. He was not done at the knee make potassium was 4.1, creatinine 0.93. Liver enzymes were elevated with AST of 49 alkaline phosphatase low at 38. EKG revealed some nonspecific T wave changes inferiorly. He denied any chest pain nausea or vomiting. Echocardiogram has been completed, revealing an EF of 55-60% with severe hypokinesis of a very small segment of the myocardium at the base of the inferior myocardium. He did have aortic valve stenosis mild with a valve area 1.33 cm square. There was no evidence of  cardiomyopathy.    He has an additional history of hypercholesterolemia, GERD, thrombocytopenia. He takes a proton pump inhibitor and statin on  medications at home. He is no family history of heart disease.  Review of systems complete and found to be negative unless listed above   Past Medical History  Diagnosis Date  . Allergy   . Gout   . Hyperlipidemia   . EtOH dependence   . Hemothorax on right     Family History  Problem Relation Age of Onset  . Arthritis Mother   . Tuberculosis Father     History   Social History  . Marital Status: Widowed    Spouse Name: N/A    Number of Children: N/A  . Years of Education: N/A   Occupational History  . Not on file.   Social History Main Topics  . Smoking status: Former Smoker -- 1.0 packs/day for 10 years    Quit date: 08/09/1969  . Smokeless tobacco: Never Used  . Alcohol Use: Yes  . Drug Use: No  . Sexually Active: Not on file   Other Topics Concern  . Not on file   Social History Narrative   Lives in [place],  Has two sons who live with himHe is self employed.    Past Surgical History  Procedure Date  . Appendectomy   . Hernia repair   . Tonsilectomy, adenoidectomy, bilateral myringotomy and tubes   . 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 Partial hip arthroplasty 2010    left     Prescriptions prior to admission  Medication Sig Dispense Refill  . albuterol (PROVENTIL HFA;VENTOLIN HFA) 108 (90 BASE) MCG/ACT inhaler Inhale 2 puffs into the lungs every 2 (two) hours as needed for wheezing or shortness of breath (cough).  1 Inhaler  0  . ALPRAZolam (XANAX) 1 MG tablet Take 1 mg by mouth 3 (three) times daily as needed. For anxiety      . cetirizine (ZYRTEC) 10 MG tablet Take 10 mg by mouth daily.        Marland Kitchen gabapentin (NEURONTIN) 300 MG capsule Take 300 mg by mouth 2 (two) times daily.      Marland Kitchen HYDROcodone-acetaminophen (NORCO) 5-325 MG per tablet Take 1 tablet by mouth 2 (two) times daily. For pain      .  ibuprofen (ADVIL,MOTRIN) 800 MG tablet Take 800 mg by mouth every 8 (eight) hours as needed. For pain      . nystatin cream (MYCOSTATIN) Apply 1 application topically 2 (two) times daily as needed. For rash      . omeprazole (PRILOSEC) 20 MG capsule Take 20 mg by mouth daily as needed. Heart burn      . sildenafil (VIAGRA) 100 MG tablet Take 1 tablet (100 mg total) by mouth every other day as needed for erectile dysfunction.  10 tablet  11  . simvastatin (ZOCOR) 40 MG tablet TAKE 1 TABLET AT BEDTIME  90 tablet  2    Physical Exam: Blood pressure 162/90, pulse 79, temperature 97.4 F (36.3 C), temperature source Oral, resp. rate 20, height 5\' 6"  (1.676 m), weight 195 lb 15.8 oz (88.9 kg), SpO2 98.00%.  General: Well developed, well nourished, in no acute distress, unkept. Head: Eyes PERRLA, No xanthomas.   Normal cephalic and atramatic  Lungs: Clear bilaterally to auscultation and percussion. Heart: HRRR S1 S2, 1/6 systolic murmur.  Pulses are 2+ & equal.           Bilateral carotid bruits versus transmitted murmur. No JVD.  No abdominal bruits. No femoral bruits. Abdomen: Bowel sounds are positive, abdomen soft and non-tender without masses or                  Hernia's noted. Msk:  Back normal, normal gait. Normal strength and tone for age.Laceration to the left lower lip and left forehead. Extremities: No clubbing, cyanosis or edema.  DP +1 Neuro: Alert and oriented X 3. Psych:  Good affect, responds appropriately   Lab Results  Component Value Date   WBC 5.5 08/03/2011   HGB 13.0 08/03/2011   HCT 37.1* 08/03/2011   MCV 91.4 08/03/2011   PLT 97* 08/03/2011     Lab 08/03/11 0239 08/02/11 0943  NA 138 --  K 4.1 --  CL 103 --  CO2 26 --  BUN 10 --  CREATININE 0.93 --  CALCIUM 9.1 --  PROT -- 6.4  BILITOT -- 0.4  ALKPHOS -- 38*  ALT -- 30  AST -- 49*  GLUCOSE 104* --   Lab Results  Component Value Date   CKTOTAL 381* 08/03/2011   CKMB 3.2 08/03/2011   TROPONINI <0.30 08/03/2011     Lab Results  Component Value Date   CHOL 167 04/12/2011   CHOL 156 08/18/2010   CHOL 159 05/14/2009   Lab Results  Component Value Date   HDL 43.50 04/12/2011   HDL 43.10 08/18/2010   HDL 54.90 05/14/2009   Lab Results  Component Value Date   LDLCALC 99 04/12/2011   LDLCALC 93 08/18/2010   LDLCALC 78 05/14/2009  Lab Results  Component Value Date   TRIG 123.0 04/12/2011   TRIG 102.0 08/18/2010   TRIG 133.0 05/14/2009   Lab Results  Component Value Date   CHOLHDL 4 04/12/2011   CHOLHDL 4 08/18/2010   CHOLHDL 3 05/14/2009   Lab Results  Component Value Date   LDLDIRECT 152.9 02/05/2009   Echocardiogram:  Left ventricle: The cavity size was normal. There was mild concentric hypertrophy. Systolic function was normal. The estimated ejection fraction was in the range of 55% to 60%. Severe hypokinesis of of very small segment of myocardium at the base of theinferolateral myocardium. - Aortic valve: Mildly to moderately calcified annulus. Trileaflet; mildly thickened, mildly calcified leaflets. Cusp separation was mildly reduced. There was very mild stenosis. Valve area: 1.33cm^2(VTI). Valve area: 1.52cm^2 (Vmax). - Mitral valve: Calcified, moderately thickened annulus. - Atrial septum: No defect or patent foramen ovale was identified. Transthoracic echocardiography. M-mode, complete 2D,     Radiology: Dg Chest 2 View  08/02/2011  *RADIOLOGY REPORT*  Clinical Data: Shortness of breath.  CHEST - 2 VIEW  Comparison: 07/19/2011  Findings: Borderline heart size.  No effusions or edema.  Scarring at the right lung base.  Left lung is clear.  No acute bony abnormality.  IMPRESSION: No acute cardiopulmonary disease.  Original Report Authenticated By: Cyndie Chime, M.D.   Ct Head Wo Contrast  08/02/2011  *RADIOLOGY REPORT*  Clinical Data: Fall.  CT HEAD WITHOUT CONTRAST  Technique:  Contiguous axial images were obtained from the base of the skull through the vertex without contrast.   Comparison: 01/09/2011  Findings: Mild cerebral atrophy. No acute intracranial abnormality. Specifically, no hemorrhage, hydrocephalus, mass lesion, acute infarction, or significant intracranial injury.  No acute calvarial abnormality.  Mucosal thickening throughout the paranasal sinuses.  No air fluid levels.  Mastoids are clear.  IMPRESSION: No acute intracranial abnormality.  Chronic sinusitis.  Original Report Authenticated By: Cyndie Chime, M.D.   EKG:NSR with T-wave inversion in the inferior lead. New from prior EKG  ASSESSMENT AND PLAN:   1. Positive, CK, CK-MB, with negative troponin: I have advised hospitalist to turn the heparin off as this does not appear to be an ischemic event. Elevated enzymes are related to fall.  He did have some mild aortic valve stenosis. Doubt any further cardiac testing will be necessary in this setting. He does have risk factors for coronary artery disease to include hypercholesterolemia.  Can plan to see him as an outpatient and schedule a stress Myoview for further evaluation of EKG abnormalities.   ETOH Abuse: He readily admits to being an alcoholic. I have asked him if he had ever sought treatment for this in the past and he suggests, but then was evasive. This may need to be re\re addressed during hospitalization with suggestion of need to go to AA vs. an inpatient setting. Long-term use of alcohol can adversely affect, cardiac status. Thrombocytopenia is also noted.   Bettey Mare. Lyman Bishop NP [facility] 08/03/2011, 11:13 AM  Cardiology Attending Patient interviewed and examined. Discussed with Joni Reining, NP.  Above note annotated and modified based upon my findings.  Patient has mild aortic stenosis that is certainly not contributing to any of his symptoms. Elevated cardiac markers are consistent with muscle injury, probably related to his fall. Alcohol can also result in a toxic myositis.  Echocardiography shows a small segmental wall motion  abnormality consistent with a previous minor inferior MI. Plans for outpatient stress testing are appropriate. Patient endorses  this and does not wish to stay any longer in hospital than is absolutely necessary. He has bilateral carotid bruits versus transmitted murmur. Carotid ultrasound will be necessary, but this too can be done as an outpatient if it will delay hospital discharge. We will arrange for continued care in our office.  Crane Bing, MD 08/03/2011, 6:05 PM

## 2011-08-03 NOTE — Progress Notes (Signed)
*  PRELIMINARY RESULTS* Echocardiogram 2D Echocardiogram has been performed.  Victor Diaz 08/03/2011, 9:27 AM

## 2011-08-03 NOTE — Progress Notes (Signed)
Physician Discharge Summary  Patient ID: Victor Diaz MRN: [id number] DOB/AGE: Nov 19, 1945 66 y.o.  Admit date: 08/02/2011 Discharge date: 08/03/2011  Primary Care Physician:  Judie Petit, MD, MD   Discharge Diagnoses:    Principal Problem:  *Generalized weakness Active Problems:  HYPERLIPIDEMIA  DEPENDENCE, ALCOHOL NEC/NOS, UNSPECIFIED  GERD   Fall  Thrombocytopenia  Hyponatremia  Aortic stenosis  Cerebrovascular disease  ASCVD (arteriosclerotic cardiovascular disease)    Medication List  As of 08/03/2011  6:46 PM   STOP taking these medications         ibuprofen 800 MG tablet         TAKE these medications         albuterol 108 (90 BASE) MCG/ACT inhaler   Commonly known as: PROVENTIL HFA;VENTOLIN HFA   Inhale 2 puffs into the lungs every 2 (two) hours as needed for wheezing or shortness of breath (cough).      ALPRAZolam 1 MG tablet   Commonly known as: XANAX   Take 1 mg by mouth 3 (three) times daily as needed. For anxiety      aspirin 81 MG EC tablet   Take 1 tablet (81 mg total) by mouth daily. Swallow whole.      cetirizine 10 MG tablet   Commonly known as: ZYRTEC   Take 10 mg by mouth daily.      gabapentin 300 MG capsule   Commonly known as: NEURONTIN   Take 300 mg by mouth 2 (two) times daily.      HYDROcodone-acetaminophen 5-325 MG per tablet   Commonly known as: NORCO   Take 1 tablet by mouth 2 (two) times daily. For pain      nystatin cream   Commonly known as: MYCOSTATIN   Apply 1 application topically 2 (two) times daily as needed. For rash      omeprazole 20 MG capsule   Commonly known as: PRILOSEC   Take 20 mg by mouth daily as needed. Heart burn      sildenafil 100 MG tablet   Commonly known as: VIAGRA   Take 1 tablet (100 mg total) by mouth every other day as needed for erectile dysfunction.      simvastatin 40 MG tablet   Commonly known as: ZOCOR   TAKE 1 TABLET AT BEDTIME           Discharge Exam: Blood  pressure 123/79, pulse 74, temperature 97.5 F (36.4 C), temperature source Oral, resp. rate 20, height 5\' 6"  (1.676 m), weight 88.9 kg (195 lb 15.8 oz), SpO2 100.00%. NAD CTA B S1, S2, RRR Soft, NT, BS+ No edema b/l  Disposition and Follow-up:  Follow up with cardiology for outpatient stress test Follow up with primary doctor in 2-3 weeks  Consults:  Cardiology, Dr. Dietrich Pates   Significant Diagnostic Studies:  Dg Chest 2 View  08/02/2011  *RADIOLOGY REPORT*  Clinical Data: Shortness of breath.  CHEST - 2 VIEW  Comparison: 07/19/2011  Findings: Borderline heart size.  No effusions or edema.  Scarring at the right lung base.  Left lung is clear.  No acute bony abnormality.  IMPRESSION: No acute cardiopulmonary disease.  Original Report Authenticated By: Cyndie Chime, M.D.   Ct Head Wo Contrast  08/02/2011  *RADIOLOGY REPORT*  Clinical Data: Fall.  CT HEAD WITHOUT CONTRAST  Technique:  Contiguous axial images were obtained from the base of the skull through the vertex without contrast.  Comparison: 01/09/2011  Findings: Mild cerebral atrophy. No acute intracranial abnormality.  Specifically, no hemorrhage, hydrocephalus, mass lesion, acute infarction, or significant intracranial injury.  No acute calvarial abnormality.  Mucosal thickening throughout the paranasal sinuses.  No air fluid levels.  Mastoids are clear.  IMPRESSION: No acute intracranial abnormality.  Chronic sinusitis.  Original Report Authenticated By: Cyndie Chime, M.D.    Brief H and P: For complete details please refer to admission H and P, but in brief This is a 66 year old gentleman with history of alcohol abuse, hyperlipidemia. Patient presents to the emergency room today after having fallen. He reports having fallen multiple times over the past few days. He denies any loss of consciousness. He has been feeling generally weak for the past few weeks. He denies any chest pain, lightheadedness, nausea, diaphoresis. On my  questioning he denies any shortness of breath but had reported being short of breath and the ER physician prior to arrival. He reports that his main complaint is weakness right now. He denies any cardiac history. He reports having a stress test done approximately 10 years ago. He denies any fever, cough, dysuria, abdominal pain. He was evaluated in the emergency room where he was noted to have elevated creatine kinase as well as a point-of-care troponin. Patient has been referred for admission.   Hospital Course:  This is a 66 y/o gentleman who was admitted to the hospital after having a fall.  He reported to be generally weak, but did not have any other specific complaints including chest pain or shortness of breath.  Work up in the ED revealed elevated CK and elevated POC troponin.  He also did have some minor EKG changes with T inversions in III.  He was admitted to the hospital and seen by cardiology.  The remainder of his serum troponins were negative. His CKs were elevated but this was felt to be due to muscle injury after the fall.  2d echo was done which showed a normal EF and severe hypokinesis of a very small segment at the base of the inferolateral myocardium.  He also had some mild aortic stenosis.  Patient feels back to baseline.  It was felt that further work up including stress testing can be done as an outpatient.  We have started him on a daily aspirin.  Carotid dopplers were also recommended, but these may also be pursued as an outpatient.  Regarding his generalized weakness, it was felt to be due to dehydration. He was given IV fluids and feels significantly better.  He was seen by physical therapy and no outpatient physical therapy was recommended.  His thrombocytopenia is felt to be due to his alcohol abuse  Patient was placed on CIWA protocol and did not show any signs of withdrawal.  He was seen by social work and counseled extensively about his alcohol abuse.  He does recognize he  has a problem.  He has been following at [facility] for this.  He plans on going to AA in [place] with his brother in law.  Patient is felt safe to discharge to follow up with cardiology and his PCP as an outpatient.  Time spent on Discharge:  Signed: Adelfo Diebel [facility] Pager: [phone] 08/03/2011, 6:46 PM

## 2011-08-03 NOTE — Progress Notes (Signed)
MD paged and made aware of abnormal EKG

## 2011-08-03 NOTE — Clinical Social Work Psychosocial (Signed)
Clinical Social Work Department BRIEF PSYCHOSOCIAL ASSESSMENT 08/03/2011  Patient:  BERTHEL, BAGNALL     Account Number:  [id number]     Admit date:  08/02/2011  Clinical Social Worker:  Andres Shad  Date/Time:  08/03/2011 12:56 PM  Referred by:  Physician  Date Referred:  08/03/2011 Referred for  Substance Abuse   Other Referral:   Outpatient resources vs treatment inpatient   Interview type:  Patient Other interview type:   chart review    PSYCHOSOCIAL DATA Living Status:  FAMILY Admitted from facility:   Level of care:   Primary support name:  son Primary support relationship to patient:  FAMILY Degree of support available:   Limited. Patient reports he is the caregiver and main support for all three sons, granddaughter and greatgranddaughter.  Reports wife passed in the 39's from cancer  Has a girlfriend who lives in [place]    CURRENT CONCERNS Current Concerns  Substance Abuse   Other Concerns:   None    SOCIAL WORK ASSESSMENT / PLAN Met with patient at the bedside to discuss current substance abuse with regards to patient being admitted after a fall from be intoxicated.  patient reports he was intoxicated that he has been binge drinking for the last two weeks.  Reports no new stressors or problems.  Reports high care for three sons who all live with him and how he has raised his granddaughter up until September when she had a baby and she moved in with her boyfriend.  Reports he has drank alcohol all his life and he has always had a struggle with alcohol.  Reports he has been sober in the past, completed treatment at [facility] and also is a current outpatient at [facility] with SA treatment.  He has also had a recent DUI in which he is on probation and completing community services.  He is interested in AA in which CSW will provide information.  Patient and CSW discussed reasons for excessive drinking and patient became tearful and reports stress with taking care of  everyone, being the only financial means for his family and still missing/grieving his wife whom he lost to in 1998.  Reports when she was alive he was not this bad with alcohol and he was actually only a social drinker, but since she has passed it is his way of coping.  Discussed options of treatment, in which patient reports he needs to complete all the legal priorities first, but will continue to follow up with [facility] and two day a week counseling.  Patient reports he is appreciative of the concern and is aware of his problem.  Reports his reason for admission and fall is he was trying to stop drinking at 10 am two mornings ago, took a Xanax and then around 2pm decided he was not ready, thus drank more alcohol (8-9 beers) and then fell.  Reports he beat the floor to have his sons come and help him, however they could not hear him and he call 911.  Patient reports he is going to decrease his drinking and aware of his problem. No inpatient treatment wanted at this time.  Outpatient referrals and information about AA will be given to the patient at discharge   Assessment/plan status:  Referral to [facility] Other assessment/ plan:   No other needs at this time.  patient will dc home to his residence   Information/referral to community resources:   AA meetings in [place]   SBIRT completed  PATIENT'S/FAMILY'S RESPONSE TO PLAN OF CARE: Patient very appreciate of visit and assessment.  Agreeable to education provided on SA and medical problems and discussed decreasing and ceasing consumption of alcohol.    Andrue Dini Nail, MSW LCSW

## 2011-08-03 NOTE — Discharge Instructions (Signed)
Alcohol Problems Most adults who drink alcohol drink in moderation (not a lot) are at low risk for developing problems related to their drinking. However, all drinkers, including low-risk drinkers, should know about the health risks connected with drinking alcohol. RECOMMENDATIONS FOR LOW-RISK DRINKING  Drink in moderation. Moderate drinking is defined as follows:   Men - no more than 2 drinks per day.   Nonpregnant women - no more than 1 drink per day.   Over age 97 - no more than 1 drink per day.  A standard drink is 12 grams of pure alcohol, which is equal to a 12 ounce bottle of beer or wine cooler, a 5 ounce glass of wine, or 1.5 ounces of distilled spirits (such as whiskey, brandy, vodka, or rum).  ABSTAIN FROM (DO NOT DRINK) ALCOHOL:  When pregnant or considering pregnancy.   When taking a medication that interacts with alcohol.   If you are alcohol dependent.   A medical condition that prohibits drinking alcohol (such as ulcer, liver disease, or heart disease).  DISCUSS WITH YOUR CAREGIVER:  If you are at risk for coronary heart disease, discuss the potential benefits and risks of alcohol use: Light to moderate drinking is associated with lower rates of coronary heart disease in certain populations (for example, men over age 87 and postmenopausal women). Infrequent or nondrinkers are advised not to begin light to moderate drinking to reduce the risk of coronary heart disease so as to avoid creating an alcohol-related problem. Similar protective effects can likely be gained through proper diet and exercise.   Women and the elderly have smaller amounts of body water than men. As a result women and the elderly achieve a higher blood alcohol concentration after drinking the same amount of alcohol.   Exposing a fetus to alcohol can cause a broad range of birth defects referred to as Fetal Alcohol Syndrome (FAS) or Alcohol-Related Birth Defects (ARBD). Although FAS/ARBD is connected with  excessive alcohol consumption during pregnancy, studies also have reported neurobehavioral problems in infants born to mothers reporting drinking an average of 1 drink per day during pregnancy.   Heavier drinking (the consumption of more than 4 drinks per occasion by men and more than 3 drinks per occasion by women) impairs learning (cognitive) and psychomotor functions and increases the risk of alcohol-related problems, including accidents and injuries.  CAGE QUESTIONS:   Have you ever felt that you should Cut down on your drinking?   Have people Annoyed you by criticizing your drinking?   Have you ever felt bad or Guilty about your drinking?   Have you ever had a drink first thing in the morning to steady your nerves or get rid of a hangover (Eye opener)?  If you answered positively to any of these questions: You may be at risk for alcohol-related problems if alcohol consumption is:   Men: Greater than 14 drinks per week or more than 4 drinks per occasion.   Women: Greater than 7 drinks per week or more than 3 drinks per occasion.  Do you or your family have a medical history of alcohol-related problems, such as:  Blackouts.   Sexual dysfunction.   Depression.   Trauma.   Liver dysfunction.   Sleep disorders.   Hypertension.   Chronic abdominal pain.   Has your drinking ever caused you problems, such as problems with your family, problems with your work (or school) performance, or accidents/injuries?   Do you have a compulsion to drink or a preoccupation  Do you have a compulsion to drink or a preoccupation with drinking?   Do you have poor control or are you unable to stop drinking once you have started?   Do you have to drink to avoid withdrawal symptoms?   Do you have problems with withdrawal such as tremors, nausea, sweats, or mood disturbances?   Does it take more alcohol than in the past to get you high?   Do you feel a strong urge to drink?   Do you change your plans so that you can have a drink?   Do you ever drink in the morning to relieve  the shakes or a hangover?  If you have answered a number of the previous questions positively, it may be time for you to talk to your caregivers, family, and friends and see if they think you have a problem. Alcoholism is a chemical dependency that keeps getting worse and will eventually destroy your health and relationships. Many alcoholics end up dead, impoverished, or in prison. This is often the end result of all chemical dependency.   Do not be discouraged if you are not ready to take action immediately.   Decisions to change behavior often involve up and down desires to change and feeling like you cannot decide.   Try to think more seriously about your drinking behavior.   Think of the reasons to quit.  WHERE TO GO FOR ADDITIONAL INFORMATION    The National Institute on Alcohol Abuse and Alcoholism (NIAAA)www.niaaa.nih.gov   National Council on Alcoholism and Drug Dependence (NCADD)www.ncadd.org   American Society of Addiction Medicine (ASAM)www.asam.org  Document Released: 04/03/2005 Document Revised: 03/23/2011 Document Reviewed: 11/20/2007  Exit[employer] Patient Information 2012 [employer].

## 2011-08-03 NOTE — Telephone Encounter (Signed)
Pt called and said that he was sch to come in yesterday,but was admitted to [facility] re: a fall pt had at house. Pt is still in hospital. Pt said that he will rsc ov when he gets out of hosp.

## 2011-08-03 NOTE — Plan of Care (Signed)
Problem: Phase I Progression Outcomes Goal: OOB as tolerated unless otherwise ordered Outcome: Completed/Met Date Met:  08/03/11 Patient up to chair with assist, instructed to call for assist. Tolerates activity well today. Nursing to monitor.

## 2011-08-03 NOTE — Progress Notes (Signed)
08/03/11 1910 Patient being discharged home, reviewed discharge instructions with patient via teachback method. Given copy of instructions, med list, f/u appointment  Information. Instructed to call for stress test appointment as ordered, stated understood. Denies pain or discomfort at this time. IV sites d/c'd and within normal limits. Pt in stable condition awaiting family arrival for discharge home.

## 2011-08-03 NOTE — Evaluation (Signed)
Physical Therapy Evaluation Patient Details Name: Victor Diaz MRN: [id number] DOB: 08/16/1945 Today's Date: 08/03/2011  Problem List:  Patient Active Problem List  Diagnoses  . HYPERLIPIDEMIA  . GOUT  . UNSPECIFIED ANEMIA  . DEPENDENCE, ALCOHOL NEC/NOS, UNSPECIFIED  . ALLERGIC RHINITIS  . GERD  . LIVER FUNCTION TESTS, ABNORMAL  . PLEURAL EFFUSION, RIGHT  . Bladder neck obstruction  . Dyspnea  . Cardiac enzymes elevated  . Fall  . Generalized weakness  . Thrombocytopenia  . Hyponatremia    Past Medical History:  Past Medical History  Diagnosis Date  . Allergy   . Gout   . Hyperlipidemia   . EtOH dependence   . Hemothorax on right    Past Surgical History:  Past Surgical History  Procedure Date  . Appendectomy   . Hernia repair   . Tonsilectomy, adenoidectomy, bilateral myringotomy and tubes   . 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 Partial hip arthroplasty 2010    left    PT Assessment/Plan/Recommendation PT Assessment Clinical Impression Statement: Pt was seen for eval.  He states that his falls have only occurred when he has been drinking.  The eval uncovers no significant weakness or balance dysfunction, so I tend to believe what he says.  Unfortunately, there is no way to protect him from falls due to inebriation.  No PT indicated at this time. PT Recommendation/Assessment: Patent does not need any further PT services No Skilled PT: Patient at baseline level of functioning;Patient is independent with all acitivity/mobility PT Recommendation Follow Up Recommendations: No PT follow up Equipment Recommended: None recommended by PT PT Goals     PT Evaluation Precautions/Restrictions  Restrictions Weight Bearing Restrictions: No Prior Functioning  Home Living Lives With: Family Available Help at Discharge: Family Type of Home: House Home Access: Stairs to enter Secretary/administrator of Steps: 2 Entrance Stairs-Rails: None Home Layout:  Laundry or work area in basement;One level Bathroom Toilet: Standard Home Adaptive Equipment: Walker - rolling;Straight cane;Bedside commode/3-in-1 Prior Function Level of Independence: Independent Able to Take Stairs?: Reciprically Driving: No Vocation: Retired Financial risk analyst Overall Cognitive Status: Appears within functional limits for tasks assessed/performed Arousal/Alertness: Awake/alert Orientation Level: Appears intact for tasks assessed Behavior During Session: [facility] for tasks performed Sensation/Coordination Sensation Light Touch: Appears Intact Stereognosis: Not tested Hot/Cold: Not tested Proprioception: Appears Intact Coordination Gross Motor Movements are Fluid and Coordinated: Yes Fine Motor Movements are Fluid and Coordinated: Yes Extremity Assessment RUE Assessment RUE Assessment: Within Functional Limits LUE Assessment LUE Assessment: Within Functional Limits RLE Assessment RLE Assessment: Within Functional Limits LLE Assessment LLE Assessment: Within Functional Limits Mobility (including Balance) Bed Mobility Bed Mobility: Yes Supine to Sit: 7: Independent Sit to Supine: 7: Independent Transfers Transfers: Yes Sit to Stand: 7: Independent Stand to Sit: 7: Independent Ambulation/Gait Ambulation/Gait: Yes Ambulation/Gait Assistance: 7: Independent Ambulation Distance (Feet): 175 Feet Assistive device: None Gait Pattern: Within Functional Limits Stairs: Yes Stairs Assistance: 6: Modified independent (Device/Increase time) Stair Management Technique: No rails;Forwards Number of Stairs: 3  Wheelchair Mobility Wheelchair Mobility: No  Posture/Postural Control Posture/Postural Control: No significant limitations Balance Balance Assessed: Yes High Level Balance High Level Balance Activites: Side stepping;Backward walking;Turns;Sudden stops;Head turns High Level Balance Comments: no LOB with above activities...he does have mild difficulty with  tandem walking but this is not evidenced in normal ADLs Exercise    End of Session PT - End of Session Equipment Utilized During Treatment: Gait belt Activity Tolerance: Patient tolerated treatment well Patient left: in chair;with call  bell in reach;with bed alarm set General Behavior During Session: [facility] for tasks performed Cognition: [facility] for tasks performed  Konrad Penta 08/03/2011, 1:38 PM

## 2011-08-03 NOTE — Progress Notes (Signed)
Subjective: Patient feels much better today, denies any chest pain, shortness of breath, dizziness, wants to go home  Objective: Vital signs in last 24 hours: Temp:  [97.4 F (36.3 C)-97.9 F (36.6 C)] 97.5 F (36.4 C) (04/18 1138) Pulse Rate:  [73-103] 73  (04/18 1138) Resp:  [12-20] 20  (04/18 1138) BP: (97-162)/(63-90) 146/75 mmHg (04/18 1138) SpO2:  [97 %-100 %] 97 % (04/18 1138) Weight:  [88.9 kg (195 lb 15.8 oz)] 88.9 kg (195 lb 15.8 oz) (04/17 1647) Weight change:  Last BM Date: 08/02/11  Intake/Output from previous day: 04/17 0701 - 04/18 0700 In: 240 [P.O.:240] Out: 1651 [Urine:1650; Stool:1] Total I/O In: 0  Out: 400 [Urine:400]   Physical Exam: General: Alert, awake, oriented x3, in no acute distress. HEENT: No bruits, no goiter. Heart: Regular rate and rhythm, without murmurs, rubs, gallops. Lungs: Clear to auscultation bilaterally. Abdomen: Soft, nontender, nondistended, positive bowel sounds. Extremities: No clubbing cyanosis or edema with positive pedal pulses. Neuro: Grossly intact, nonfocal.    Lab Results: Basic Metabolic Panel:  Basename 08/03/11 0239 08/02/11 0943  NA 138 129*  K 4.1 3.7  CL 103 94*  CO2 26 22  GLUCOSE 104* 87  BUN 10 9  CREATININE 0.93 0.92  CALCIUM 9.1 8.9  MG -- --  PHOS -- --   Liver Function Tests:  [facility] 08/02/11 0943  AST 49*  ALT 30  ALKPHOS 38*  BILITOT 0.4  PROT 6.4  ALBUMIN 3.5   No results found for this basename: LIPASE:2,AMYLASE:2 in the last 72 hours No results found for this basename: AMMONIA:2 in the last 72 hours CBC:  Basename 08/03/11 0239 08/02/11 0943  WBC 5.5 5.2  NEUTROABS -- 2.9  HGB 13.0 12.6*  HCT 37.1* 35.9*  MCV 91.4 89.8  PLT 97* 100*   Cardiac Enzymes:  Basename 08/03/11 1022 08/03/11 0235 08/02/11 1856  CKTOTAL 381* 572* 876*  CKMB 3.2 4.2* 6.7*  CKMBINDEX -- -- --  TROPONINI <0.30 <0.30 <0.30   BNP: No results found for this basename: PROBNP:3 in the last 72  hours D-Dimer: No results found for this basename: DDIMER:2 in the last 72 hours CBG: No results found for this basename: GLUCAP:6 in the last 72 hours Hemoglobin A1C: No results found for this basename: HGBA1C in the last 72 hours Fasting Lipid Panel: No results found for this basename: CHOL,HDL,LDLCALC,TRIG,CHOLHDL,LDLDIRECT in the last 72 hours Thyroid Function Tests:  Basename 08/02/11 1856  TSH 0.449  T4TOTAL --  FREET4 --  T3FREE --  THYROIDAB --   Anemia Panel: No results found for this basename: VITAMINB12,FOLATE,FERRITIN,TIBC,IRON,RETICCTPCT in the last 72 hours Coagulation: No results found for this basename: LABPROT:2,INR:2 in the last 72 hours Urine Drug Screen: Drugs of Abuse     Component Value Date/Time   LABOPIA NONE DETECTED 01/09/2011 0517   COCAINSCRNUR NONE DETECTED 01/09/2011 0517   LABBENZ POSITIVE* 01/09/2011 0517   AMPHETMU NONE DETECTED 01/09/2011 0517   THCU NONE DETECTED 01/09/2011 0517   LABBARB NONE DETECTED 01/09/2011 0517    Alcohol Level: No results found for this basename: ETH:2 in the last 72 hours Urinalysis: No results found for this basename: COLORURINE:2,APPERANCEUR:2,LABSPEC:2,PHURINE:2,GLUCOSEU:2,HGBUR:2,BILIRUBINUR:2,KETONESUR:2,PROTEINUR:2,UROBILINOGEN:2,NITRITE:2,LEUKOCYTESUR:2 in the last 72 hours  No results found for this or any previous visit (from the past 240 hour(s)).  Studies/Results: Dg Chest 2 View  08/02/2011  *RADIOLOGY REPORT*  Clinical Data: Shortness of breath.  CHEST - 2 VIEW  Comparison: 07/19/2011  Findings: Borderline heart size.  No effusions or edema.  Scarring  at the right lung base.  Left lung is clear.  No acute bony abnormality.  IMPRESSION: No acute cardiopulmonary disease.  Original Report Authenticated By: Cyndie Chime, M.D.   Ct Head Wo Contrast  08/02/2011  *RADIOLOGY REPORT*  Clinical Data: Fall.  CT HEAD WITHOUT CONTRAST  Technique:  Contiguous axial images were obtained from the base of the skull  through the vertex without contrast.  Comparison: 01/09/2011  Findings: Mild cerebral atrophy. No acute intracranial abnormality. Specifically, no hemorrhage, hydrocephalus, mass lesion, acute infarction, or significant intracranial injury.  No acute calvarial abnormality.  Mucosal thickening throughout the paranasal sinuses.  No air fluid levels.  Mastoids are clear.  IMPRESSION: No acute intracranial abnormality.  Chronic sinusitis.  Original Report Authenticated By: Cyndie Chime, M.D.    Medications: Scheduled Meds:   . sodium chloride   Intravenous STAT  . aspirin EC  325 mg Oral Daily  . folic acid  1 mg Oral Daily  . gabapentin  300 mg Oral BID  . heparin  2,000 Units Intravenous Once  . HYDROcodone-acetaminophen  1 tablet Oral BID  . LORazepam  0-4 mg Intravenous Q6H   Followed by  . LORazepam  0-4 mg Intravenous Q12H  . metoprolol tartrate  12.5 mg Oral BID  . mulitivitamin with minerals  1 tablet Oral Daily  . pantoprazole  40 mg Oral Q1200  . simvastatin  40 mg Oral q1800  . general admission iv infusion   Intravenous Once  . sodium chloride      . sodium chloride      . sodium chloride      . thiamine  100 mg Oral Daily   Or  . thiamine  100 mg Intravenous Daily  . DISCONTD: antiseptic oral rinse  15 mL Mouth Rinse BID  . DISCONTD: pneumococcal 23 valent vaccine  0.5 mL Intramuscular Tomorrow-1000   Continuous Infusions:   . DISCONTD: heparin 15 Units/kg/hr (08/02/11 2229)  . DISCONTD: heparin 1,300 Units/hr (08/03/11 0843)   PRN Meds:.acetaminophen, acetaminophen, albuterol, ALPRAZolam, LORazepam, LORazepam, ondansetron (ZOFRAN) IV, ondansetron  Assessment/Plan:  Principal Problem:  *Generalized weakness Active Problems:  HYPERLIPIDEMIA  DEPENDENCE, ALCOHOL NEC/NOS, UNSPECIFIED  GERD  Cardiac enzymes elevated  Fall  Thrombocytopenia  Hyponatremia  Plan:  1. Elevated cardiac markers.  Patient has not had any further symptoms.  Troponins are negative.   Elevated CK likely due to fall.  Will need stress test inpt vs. Outpt.  Cardiology following.  2. History of alcohol abuse.  Patient recognizes he has a problem.  Has been following with [facility].  Plans on going to AA with brother in law in   3. Thrombocytopenia due to alcohol abuse  4. Generalized weakness.  Seen  By physical therapy.  No further PT recommended  5. Dispo. Further testing per cardiology, after which he may be discharged home.   LOS: 1 day   Sadaf Przybysz [facility] Pager: [phone] 08/03/2011, 2:08 PM

## 2011-08-09 ENCOUNTER — Other Ambulatory Visit: Payer: Self-pay | Admitting: *Deleted

## 2011-08-09 MED ORDER — SIMVASTATIN 40 MG PO TABS: 40.0000 mg | ORAL_TABLET | Freq: Every day | ORAL | Status: DC

## 2011-08-17 ENCOUNTER — Other Ambulatory Visit: Payer: Self-pay | Admitting: Internal Medicine

## 2011-08-17 NOTE — Telephone Encounter (Signed)
Pt needs new rx simvastatin 40 mg #90 with 3 refills also alprazolam #270 with 3 refills and hydrocodone#180 [facility] [phone].

## 2011-08-18 MED ORDER — SIMVASTATIN 40 MG PO TABS: 40.0000 mg | ORAL_TABLET | Freq: Every day | ORAL | Status: DC

## 2011-08-18 MED ORDER — ALPRAZOLAM 1 MG PO TABS: 1.0000 mg | ORAL_TABLET | Freq: Three times a day (TID) | ORAL | Status: DC | PRN

## 2011-08-18 MED ORDER — HYDROCODONE-ACETAMINOPHEN 5-325 MG PO TABS
1.0000 | ORAL_TABLET | Freq: Two times a day (BID) | ORAL | Status: DC | PRN
Start: 2011-08-18 — End: 2011-09-14

## 2011-08-18 NOTE — Telephone Encounter (Signed)
rxs faxed to pharmacy

## 2011-08-23 ENCOUNTER — Ambulatory Visit: Payer: Medicare Other | Admitting: Internal Medicine

## 2011-08-29 NOTE — Progress Notes (Signed)
UR Chart Review Completed  

## 2011-09-06 ENCOUNTER — Other Ambulatory Visit: Payer: Self-pay

## 2011-09-06 NOTE — Telephone Encounter (Signed)
Pt called requesting a refill of hydrocodone be sent to [facility].  Pt states he is almost out of this medication.  Pls advise.

## 2011-09-07 NOTE — Telephone Encounter (Signed)
This was done on 08/18/11

## 2011-09-14 ENCOUNTER — Telehealth: Payer: Self-pay | Admitting: Family Medicine

## 2011-09-14 MED ORDER — HYDROCODONE-ACETAMINOPHEN 5-325 MG PO TABS: 1.0000 | ORAL_TABLET | Freq: Two times a day (BID) | ORAL | Status: DC | PRN

## 2011-09-14 NOTE — Telephone Encounter (Signed)
Was sent in #60 no refills on 08/21/11 and pt is already out of med.  Told pt that even though the directions take bid he should only take when he needs and not necessarily everyday.  Please advise.  This is a mail order

## 2011-09-14 NOTE — Telephone Encounter (Signed)
Pulled from Triage vmail - pt called at 11:19. States that we were to call [facility] to refill his hydrocortisone. Wants to know if this has been taken care of. Please call pt. Thanks.

## 2011-09-14 NOTE — Telephone Encounter (Signed)
Ok to refill Change prescription to  vicodin 5 / 325- 1 po bid prn, not to exceed 15 per month, #15/1 refill

## 2011-09-14 NOTE — Telephone Encounter (Signed)
rx faxed to pharmacy

## 2011-10-03 ENCOUNTER — Other Ambulatory Visit: Payer: Self-pay | Admitting: *Deleted

## 2011-10-03 MED ORDER — ACYCLOVIR 200 MG PO CAPS: 200.0000 mg | ORAL_CAPSULE | Freq: Four times a day (QID) | ORAL | Status: AC

## 2011-10-03 NOTE — Telephone Encounter (Signed)
ok 

## 2011-10-03 NOTE — Telephone Encounter (Signed)
rx sent in electronically 

## 2011-10-03 NOTE — Telephone Encounter (Signed)
Refill acyclovir 200 mg 1 cap qid for 10 days.  Don't see it on med list

## 2011-10-10 ENCOUNTER — Telehealth: Payer: Self-pay | Admitting: Internal Medicine

## 2011-10-10 NOTE — Telephone Encounter (Signed)
Pt requesting refill on HYDROcodone-acetaminophen (NORCO) 5-325 MG per tablet

## 2011-10-13 NOTE — Telephone Encounter (Signed)
Pt is not take no more than 15 per month and he was given a 90 day supply on 09/14/11.  Refill too soon, pt aware

## 2011-10-18 ENCOUNTER — Ambulatory Visit: Payer: Medicare Other | Admitting: Internal Medicine

## 2011-10-20 ENCOUNTER — Ambulatory Visit (INDEPENDENT_AMBULATORY_CARE_PROVIDER_SITE_OTHER)
Admission: RE | Admit: 2011-10-20 | Discharge: 2011-10-20 | Disposition: A | Discharge status: Home / Self Care | Payer: Medicare Other | Source: Ambulatory Visit | Attending: Internal Medicine | Admitting: Internal Medicine

## 2011-10-20 ENCOUNTER — Encounter: Payer: Self-pay | Admitting: Internal Medicine

## 2011-10-20 ENCOUNTER — Telehealth: Payer: Self-pay | Admitting: Family Medicine

## 2011-10-20 ENCOUNTER — Ambulatory Visit (INDEPENDENT_AMBULATORY_CARE_PROVIDER_SITE_OTHER): Payer: Medicare Other | Admitting: Internal Medicine

## 2011-10-20 VITALS — BP 100/70 | HR 106 | Temp 98.3°F

## 2011-10-20 DIAGNOSIS — S99929A Unspecified injury of unspecified foot, initial encounter: Secondary | ICD-10-CM

## 2011-10-20 DIAGNOSIS — S01319A Laceration without foreign body of unspecified ear, initial encounter: Secondary | ICD-10-CM

## 2011-10-20 DIAGNOSIS — S99919A Unspecified injury of unspecified ankle, initial encounter: Secondary | ICD-10-CM

## 2011-10-20 DIAGNOSIS — S99911A Unspecified injury of right ankle, initial encounter: Secondary | ICD-10-CM | POA: Insufficient documentation

## 2011-10-20 DIAGNOSIS — S01309A Unspecified open wound of unspecified ear, initial encounter: Secondary | ICD-10-CM

## 2011-10-20 DIAGNOSIS — S82401A Unspecified fracture of shaft of right fibula, initial encounter for closed fracture: Secondary | ICD-10-CM | POA: Insufficient documentation

## 2011-10-20 DIAGNOSIS — Z9181 History of falling: Secondary | ICD-10-CM

## 2011-10-20 DIAGNOSIS — M7989 Other specified soft tissue disorders: Secondary | ICD-10-CM

## 2011-10-20 DIAGNOSIS — S82409A Unspecified fracture of shaft of unspecified fibula, initial encounter for closed fracture: Secondary | ICD-10-CM

## 2011-10-20 DIAGNOSIS — S93326A Dislocation of tarsometatarsal joint of unspecified foot, initial encounter: Secondary | ICD-10-CM

## 2011-10-20 HISTORY — DX: Unspecified fracture of shaft of right fibula, initial encounter for closed fracture: S82.401A

## 2011-10-20 HISTORY — DX: Dislocation of tarsometatarsal joint of unspecified foot, initial encounter: S93.326A

## 2011-10-20 NOTE — Telephone Encounter (Signed)
The pt came in today complaining of foot pain.  Had imaging.  Radiology called back to inform me that he has a lisfranc fracture.  The pt was notified by telephone of the fracture.  He already had an orthopedic appt set for Friday of next week.  Told him I would call [facility] to move that appt and call him back with new information.  I moved that appt to Monday, 10-23-11 @1 :30. -8-13 Tried calling the pt again to notify him that I moved his Friday appt.  Had to leave information on his voicemail.  Tried calling twice with no answer.  10-23-11 [facility] Arrive at 1:00 Appt time at 1:30

## 2011-10-20 NOTE — Progress Notes (Signed)
Subjective:    Patient ID: Victor Diaz, male    DOB: 24-Apr-1945, 66 y.o.   MRN: [id number]  HPI Patient comes in today for acute work in visit. His PCP is out of the office. Has had a couple of problems over the last week after a fall down the end of the stairs. He had been drinking that day. He fell and had a cut on his right ear that bled and his son advised him see emergency care but he didn't at that time. He and no headache change in hearing and no current bleeding. No significant pain on the ear.  He also twisted his right foot and fell had pain and swelling difficult to walk but is ambulatory. He had an appointment with the specialist orthopedist this week but couldn't go because of transportation issues. He has a pending appointment a week from today. He has taken some Aleve for this asks about use of small amount of narcotic pain medicine for his foot pain.  He denies any headache feels somewhat weak has some soreness in his left upper chest that feels like a bruise.  He states he has drank no alcohol since his fall a week ago.  Review of Systems No fever shortness of breath unusual headaches vision hearing changes numbness that is new. No GI bleeding. Past history family history social history reviewed in the electronic medical record. Outpatient Encounter Prescriptions as of 10/20/2011  Medication Sig Dispense Refill  . albuterol (PROVENTIL HFA;VENTOLIN HFA) 108 (90 BASE) MCG/ACT inhaler Inhale 2 puffs into the lungs every 2 (two) hours as needed for wheezing or shortness of breath (cough).  1 Inhaler  0  . ALPRAZolam (XANAX) 1 MG tablet Take 1 tablet (1 mg total) by mouth 3 (three) times daily as needed. For anxiety  60 tablet  0  . cetirizine (ZYRTEC) 10 MG tablet Take 10 mg by mouth daily.        Marland Kitchen gabapentin (NEURONTIN) 300 MG capsule Take 300 mg by mouth 2 (two) times daily.      Marland Kitchen omeprazole (PRILOSEC) 20 MG capsule Take 20 mg by mouth daily as needed. Heart burn      .  sildenafil (VIAGRA) 100 MG tablet Take 1 tablet (100 mg total) by mouth every other day as needed for erectile dysfunction.  10 tablet  11  . simvastatin (ZOCOR) 40 MG tablet Take 1 tablet (40 mg total) by mouth at bedtime.  90 tablet  1  . DISCONTD: aspirin 81 MG EC tablet Take 1 tablet (81 mg total) by mouth daily. Swallow whole.  30 tablet  12  . DISCONTD: HYDROcodone-acetaminophen (NORCO) 5-325 MG per tablet Take 1 tablet by mouth 2 (two) times daily as needed for pain. Not to exceed 15 per month  45 tablet  0  . DISCONTD: nystatin cream (MYCOSTATIN) Apply 1 application topically 2 (two) times daily as needed. For rash          Objective:   Physical Exam BP 100/70  Pulse 106  Temp 98.3 F (36.8 C) (Oral)  SpO2 97% Well-developed well-nourished alert in no acute distress looks tired quiet respirations. HEENT normocephalic no bruising on head right pinna shows a through and through laceration at the top of his pinna with a scab and no swelling redness or discharge. TMs are clear bilaterally no deformity of skull. Eyes PERRLA EOMs full nares patent tongue is midline Neck shows no masses generally supple Chest cardiac S1-S2 there is some faded  bruising in the left upper chest to left lateral rib and upper abdomen not significantly tender no point bony tenderness. Abdomen generally soft examined sitting up..  Right lower extremity +2 edema including anterior ankle lateral and medial malleolus  tender distal fibula a positive squeeze test  bruising at the midfoot tenderness diffusely there. Toes have bruising. Few varicose veins bilaterally no edema of the calf Pulses intact.    Assessment & Plan:   History of a fall/ alcohol related  . Contusions left chest below 2 injuries addressed.  Cartilage laceration right pinna does not appear to be infected at this point and unsure if there is other interventions appropriate we'll get ENT to see keep clean antibiotic ointment he is up-to-date on  his tetanus.  Right lower extremity ankle-foot injury.  Positive squeeze test and swelling anteriorly in all positions does not have an appointment until next week we'll get an x-ray today and if fracture refer more acutely declined adding hydrocodone at this time for risk of falling elevation and ice in the meantime.  Alcohol  he currently is alcohol free. No signs of withdrawal. Patient is currently cognitively intact   ADDENDUM:   X ray shows distal fib fx and  lisfranc fracture  Right foot.   Will get ortho to see sooner .

## 2011-10-20 NOTE — Patient Instructions (Addendum)
Get the x-ray done today and we will notify you of results.  Keep the appointment with [facility] orthopedics you have at least a grade 2 sprain of your ankle. We'll do any her nose and throat consult about the cartilage laceration of your ear although I am unsure if it there are other interventions. Use antibiotic ointment in the meantime and seek emergent care if gets increasing pain redness or swelling.  Discuss other problems with your primary care physician he should probably make a routine appointment with him.  Avoid alcohol it can cause serious injury.  Ankle Sprain An ankle sprain is an injury to the strong, fibrous tissues (ligaments) that hold the bones of your ankle joint together.  CAUSES Ankle sprain usually is caused by a fall or by twisting your ankle. People who participate in sports are more prone to these types of injuries.  SYMPTOMS  Symptoms of ankle sprain include:  Pain in your ankle. The pain may be present at rest or only when you are trying to stand or walk.   Swelling.   Bruising. Bruising may develop immediately or within 1 to 2 days after your injury.   Difficulty standing or walking.  DIAGNOSIS  Your caregiver will ask you details about your injury and perform a physical exam of your ankle to determine if you have an ankle sprain. During the physical exam, your caregiver will press and squeeze specific areas of your foot and ankle. Your caregiver will try to move your ankle in certain ways. An X-ray exam may be done to be sure a bone was not broken or a ligament did not separate from one of the bones in your ankle (avulsion).  TREATMENT  Certain types of braces can help stabilize your ankle. Your caregiver can make a recommendation for this. Your caregiver may recommend the use of medication for pain. If your sprain is severe, your caregiver may refer you to a surgeon who helps to restore function to parts of your skeletal system (orthopedist) or a physical  therapist. HOME CARE INSTRUCTIONS  Apply ice to your injury for 1 to 2 days or as directed by your caregiver. Applying ice helps to reduce inflammation and pain.  Put ice in a plastic bag.   Place a towel between your skin and the bag.   Leave the ice on for 15 to 20 minutes at a time, every 2 hours while you are awake.   Take over-the-counter or prescription medicines for pain, discomfort, or fever only as directed by your caregiver.   Keep your injured leg elevated, when possible, to lessen swelling.   If your caregiver recommends crutches, use them as instructed. Gradually, put weight on the affected ankle. Continue to use crutches or a cane until you can walk without feeling pain in your ankle.   If you have a plaster splint, wear the splint as directed by your caregiver. Do not rest it on anything harder than a pillow the first 24 hours. Do not put weight on it. Do not get it wet. You may take it off to take a shower or bath.   You may have been given an elastic bandage to wear around your ankle to provide support. If the elastic bandage is too tight (you have numbness or tingling in your foot or your foot becomes cold and blue), adjust the bandage to make it comfortable.   If you have an air splint, you may blow more air into it or let air out  to make it more comfortable. You may take your splint off at night and before taking a shower or bath.   Wiggle your toes in the splint several times per day if you are able.  SEEK MEDICAL CARE IF:   You have an increase in bruising, swelling, or pain.   Your toes feel cold.   Pain relief is not achieved with medication.  SEEK IMMEDIATE MEDICAL CARE IF: Your toes are numb or blue or you have severe pain. MAKE SURE YOU:   Understand these instructions.   Will watch your condition.   Will get help right away if you are not doing well or get worse.  Document Released: 04/03/2005 Document Revised: 03/23/2011 Document Reviewed:  11/06/2007 [facility] Patient Information 2012 [place], [place].

## 2011-10-30 ENCOUNTER — Other Ambulatory Visit: Payer: Self-pay | Admitting: *Deleted

## 2011-10-30 MED ORDER — OMEPRAZOLE 20 MG PO CPDR: 20.0000 mg | DELAYED_RELEASE_CAPSULE | Freq: Every day | ORAL | Status: DC | PRN

## 2011-11-03 ENCOUNTER — Ambulatory Visit (INDEPENDENT_AMBULATORY_CARE_PROVIDER_SITE_OTHER): Payer: Medicare Other | Admitting: Internal Medicine

## 2011-11-03 ENCOUNTER — Encounter: Payer: Self-pay | Admitting: Internal Medicine

## 2011-11-03 VITALS — BP 118/88 | HR 84 | Temp 97.8°F | Ht 66.5 in | Wt 180.0 lb

## 2011-11-03 DIAGNOSIS — F1020 Alcohol dependence, uncomplicated: Secondary | ICD-10-CM

## 2011-11-03 DIAGNOSIS — I2510 Atherosclerotic heart disease of native coronary artery without angina pectoris: Secondary | ICD-10-CM

## 2011-11-03 DIAGNOSIS — N401 Enlarged prostate with lower urinary tract symptoms: Secondary | ICD-10-CM

## 2011-11-03 DIAGNOSIS — I359 Nonrheumatic aortic valve disorder, unspecified: Secondary | ICD-10-CM

## 2011-11-03 DIAGNOSIS — I7090 Unspecified atherosclerosis: Secondary | ICD-10-CM

## 2011-11-03 DIAGNOSIS — I350 Nonrheumatic aortic (valve) stenosis: Secondary | ICD-10-CM

## 2011-11-03 DIAGNOSIS — Z0000 Encounter for general adult medical examination without abnormal findings: Secondary | ICD-10-CM

## 2011-11-03 DIAGNOSIS — E785 Hyperlipidemia, unspecified: Secondary | ICD-10-CM

## 2011-11-03 LAB — CBC WITH DIFFERENTIAL/PLATELET
Basophils Absolute: 0.1 10*3/uL (ref 0.0–0.1)
Eosinophils Relative: 2.5 % (ref 0.0–5.0)
Hemoglobin: 14.9 g/dL (ref 13.0–17.0)
Lymphocytes Relative: 16.7 % (ref 12.0–46.0)
Monocytes Relative: 10.7 % (ref 3.0–12.0)
Neutro Abs: 4.9 10*3/uL (ref 1.4–7.7)
Platelets: 193 10*3/uL (ref 150.0–400.0)
RDW: 13.8 % (ref 11.5–14.6)
WBC: 7 10*3/uL (ref 4.5–10.5)

## 2011-11-03 LAB — BASIC METABOLIC PANEL
BUN: 11 mg/dL (ref 6–23)
CO2: 23 mEq/L (ref 19–32)
Chloride: 99 mEq/L (ref 96–112)
GFR: 67.23 mL/min (ref >60.00)
Glucose, Bld: 99 mg/dL (ref 70–99)
Potassium: 4.8 mEq/L (ref 3.5–5.1)
Sodium: 133 mEq/L — ABNORMAL LOW (ref 135–145)

## 2011-11-03 LAB — HEPATIC FUNCTION PANEL
ALT: 23 U/L (ref 0–53)
AST: 30 U/L (ref 0–37)
Albumin: 4.4 g/dL (ref 3.5–5.2)
Alkaline Phosphatase: 51 U/L (ref 39–117)

## 2011-11-03 LAB — LIPID PANEL
HDL: 49.5 mg/dL (ref >39.00)
Total CHOL/HDL Ratio: 4

## 2011-11-03 LAB — PSA: PSA: 0.47 ng/mL (ref 0.10–4.00)

## 2011-11-03 NOTE — Assessment & Plan Note (Signed)
This is his most significant problem Advised AA- he will schedule meeting today

## 2011-11-03 NOTE — Progress Notes (Signed)
Patient ID: Victor Diaz, male   DOB: 04-11-1946, 66 y.o.   MRN: [id number] Alcoholism- he continues to struggle AA- he has never been but he has been to detox. He has had multiple medical complications related to alcohol-  Past Medical History  Diagnosis Date  . Allergy   . Gout   . Hyperlipidemia   . EtOH dependence   . Hemothorax on right     History   Social History  . Marital Status: Widowed    Spouse Name: N/A    Number of Children: N/A  . Years of Education: N/A   Occupational History  . Not on file.   Social History Main Topics  . Smoking status: Former Smoker -- 1.0 packs/day for 10 years    Quit date: 08/09/1969  . Smokeless tobacco: Never Used  . Alcohol Use: Yes  . Drug Use: No  . Sexually Active: Not on file   Other Topics Concern  . Not on file   Social History Narrative   Lives in [place],  Has two sons who live with himHe is self employed.He does not have a valid driver's license because of alcohol-related in fractionsSon drives him ;has MS    Past Surgical History  Procedure Date  . Appendectomy   . Hernia repair   . Tonsilectomy, adenoidectomy, bilateral myringotomy and tubes   . 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 Partial hip arthroplasty 2010    left    Family History  Problem Relation Age of Onset  . Arthritis Mother   . Tuberculosis Father     Allergies  Allergen Reactions  . Atorvastatin Anaphylaxis    facial swelling    Current Outpatient Prescriptions on File Prior to Visit  Medication Sig Dispense Refill  . ALPRAZolam (XANAX) 1 MG tablet Take 1 tablet (1 mg total) by mouth 3 (three) times daily as needed. For anxiety  60 tablet  0  . cetirizine (ZYRTEC) 10 MG tablet Take 10 mg by mouth daily.        Marland Kitchen gabapentin (NEURONTIN) 300 MG capsule Take 300 mg by mouth 2 (two) times daily.      Marland Kitchen omeprazole (PRILOSEC) 20 MG capsule Take 1 capsule (20 mg total) by mouth daily as needed. Heart burn  30 capsule  5  .  sildenafil (VIAGRA) 100 MG tablet Take 1 tablet (100 mg total) by mouth every other day as needed for erectile dysfunction.  10 tablet  11  . simvastatin (ZOCOR) 40 MG tablet Take 1 tablet (40 mg total) by mouth at bedtime.  90 tablet  1  . zolpidem (AMBIEN) 10 MG tablet Take 1 tablet by mouth At bedtime.         patient denies chest pain, shortness of breath, orthopnea. Denies lower extremity edema, abdominal pain, change in appetite, change in bowel movements. Patient denies rashes, musculoskeletal complaints. No other specific complaints in a complete review of systems.   BP 118/88  Pulse 84  Temp 97.8 F (36.6 C) (Oral)  Ht 5' 6.5" (1.689 m)  Wt 180 lb (81.647 kg)  BMI 28.62 kg/m2 Well-developed male in no acute distress. HEENT exam atraumatic, normocephalic, extraocular muscles are intact. Conjunctivae are pink without exudate. Neck is supple without lymphadenopathy, thyromegaly, jugular venous distention. Chest is clear to auscultation without increased work of breathing. Cardiac exam S1-S2 are regular. The PMI is normal. No significant murmurs or gallops. Abdominal exam active bowel sounds, soft, nontender. No abdominal bruits. Extremities no clubbing cyanosis  or edema. Peripheral pulses are normal without bruits. Neurologic exam alert and oriented without any motor or sensory deficits.   Well Visit: health maint UTD

## 2011-11-03 NOTE — Assessment & Plan Note (Signed)
Pt states he has f/u with CV

## 2011-11-03 NOTE — Assessment & Plan Note (Signed)
He has CV appt

## 2011-11-07 NOTE — Progress Notes (Signed)
Pt informed

## 2011-12-21 ENCOUNTER — Ambulatory Visit (HOSPITAL_COMMUNITY): Payer: Medicare Other | Admitting: Physical Therapy

## 2011-12-21 ENCOUNTER — Telehealth: Payer: Self-pay | Admitting: Internal Medicine

## 2011-12-21 NOTE — Telephone Encounter (Signed)
Caller: [name]/Patient; [name]; PCP: Birdie Sons (Adults only); Best Callback Phone Number: [phone].  Call regarding Insomnia, onset 1 year.  Patient takes Ambien currently, not helping.   All emergent symptoms ruled out per Sleep Disorders protocol, see in 72 hours, due to persistent insomnia resulting in less than 4 hours of sleep.  Patient needs to ask Son if he can drive him to appointment on 9-6.  Patient will call back for appointment.

## 2011-12-26 ENCOUNTER — Inpatient Hospital Stay (HOSPITAL_COMMUNITY): Admission: RE | Admit: 2011-12-26 | Payer: Medicare Other | Source: Ambulatory Visit | Admitting: Physical Therapy

## 2012-01-27 ENCOUNTER — Emergency Department: Payer: Self-pay | Admitting: Emergency Medicine

## 2012-01-27 LAB — CBC
HGB: 14.8 g/dL (ref 13.0–18.0)
MCH: 34.3 pg — ABNORMAL HIGH (ref 26.0–34.0)
MCV: 98 fL (ref 80–100)
Platelet: 138 10*3/uL — ABNORMAL LOW (ref 150–440)
RBC: 4.31 10*6/uL — ABNORMAL LOW (ref 4.40–5.90)
WBC: 15.1 10*3/uL — ABNORMAL HIGH (ref 3.8–10.6)

## 2012-01-27 LAB — BASIC METABOLIC PANEL
Calcium, Total: 9.5 mg/dL (ref 8.5–10.1)
Co2: 24 mmol/L (ref 21–32)
Creatinine: 1.11 mg/dL (ref 0.60–1.30)
EGFR (African American): >60
Osmolality: 265 (ref 275–301)
Potassium: 4.5 mmol/L (ref 3.5–5.1)
Sodium: 132 mmol/L — ABNORMAL LOW (ref 136–145)

## 2012-02-02 ENCOUNTER — Ambulatory Visit (INDEPENDENT_AMBULATORY_CARE_PROVIDER_SITE_OTHER): Payer: Medicare Other | Admitting: Family

## 2012-02-02 ENCOUNTER — Ambulatory Visit (INDEPENDENT_AMBULATORY_CARE_PROVIDER_SITE_OTHER)
Admission: RE | Admit: 2012-02-02 | Discharge: 2012-02-02 | Disposition: A | Discharge status: Home / Self Care | Payer: Medicare Other | Source: Ambulatory Visit | Attending: Family | Admitting: Family

## 2012-02-02 ENCOUNTER — Encounter: Payer: Self-pay | Admitting: Family

## 2012-02-02 VITALS — BP 108/72 | Temp 98.2°F | Wt 191.7 lb

## 2012-02-02 DIAGNOSIS — G4700 Insomnia, unspecified: Secondary | ICD-10-CM

## 2012-02-02 DIAGNOSIS — S2239XA Fracture of one rib, unspecified side, initial encounter for closed fracture: Secondary | ICD-10-CM

## 2012-02-02 MED ORDER — OXYCODONE-ACETAMINOPHEN 5-325 MG PO TABS: 1.0000 | ORAL_TABLET | Freq: Three times a day (TID) | ORAL | Status: DC | PRN

## 2012-02-02 MED ORDER — ESZOPICLONE 2 MG PO TABS: 2.0000 mg | ORAL_TABLET | Freq: Every day | ORAL | Status: DC

## 2012-02-02 NOTE — Patient Instructions (Addendum)
Rib Fracture  Your caregiver has diagnosed you as having a rib fracture (a break). This can occur by a blow to the chest, by a fall against a hard object, or by violent coughing or sneezing. There may be one or many breaks. Rib fractures may heal on their own within 3 to 8 weeks. The longer healing period is usually associated with a continued cough or other aggravating activities.  HOME CARE INSTRUCTIONS    Avoid strenuous activity. Be careful during activities and avoid bumping the injured rib. Activities that cause pain pull on the fracture site(s) and are best avoided if possible.   Eat a normal, well-balanced diet. Drink plenty of fluids to avoid constipation.   Take deep breaths several times a day to keep lungs free of infection. Try to cough several times a day, splinting the injured area with a pillow. This will help prevent pneumonia.   Do not wear a rib belt or binder. These restrict breathing which can lead to pneumonia.   Only take over-the-counter or prescription medicines for pain, discomfort, or fever as directed by your caregiver.  SEEK MEDICAL CARE IF:   You develop a continual cough, associated with thick or bloody sputum.  SEEK IMMEDIATE MEDICAL CARE IF:    You have a fever.   You have difficulty breathing.   You have nausea (feeling sick to your stomach), vomiting, or abdominal (belly) pain.   You have worsening pain, not controlled with medications.  Document Released: 04/03/2005 Document Revised: 06/26/2011 Document Reviewed: 09/05/2006  [employer] Patient Information 2013 [employer].

## 2012-02-02 NOTE — Progress Notes (Signed)
Subjective:    Patient ID: Victor Diaz, male    DOB: 1945-05-31, 66 y.o.   MRN: [id number]  HPI 66 year old WM, smoker, patient of Dr. Cato Mulligan is in for a ED follow-up. Patient was seen at [facility] after a fall at home, hitting his chest on a dresser causing him to fracture a rib on his left side. Reports tripping over a rug. He is currently taking Oxycodone for relief that helps. Needs a refill. He is concerns about developing a pleural effusion as he did when he previously had a rib fracture. Wants a repeat CXR. Overall is doing better. But continues to have pain. 6/10, worse with deep breathing and movement.   Review of Systems  Constitutional: Negative.   Respiratory: Negative.   Cardiovascular: Positive for chest pain.       Pain to the left side of his chest due to rib fracture.  Gastrointestinal: Negative.   Musculoskeletal: Negative.   Skin: Negative.   Neurological: Negative.   Hematological: Negative.   Psychiatric/Behavioral: Negative.    Past Medical History  Diagnosis Date  . Allergy   . Gout   . Hyperlipidemia   . EtOH dependence   . Hemothorax on right   . Lisfranc's dislocation 10/20/2011    History   Social History  . Marital Status: Widowed    Spouse Name: N/A    Number of Children: N/A  . Years of Education: N/A   Occupational History  . Not on file.   Social History Main Topics  . Smoking status: Former Smoker -- 1.0 packs/day for 10 years    Quit date: 08/09/1969  . Smokeless tobacco: Never Used  . Alcohol Use: Yes  . Drug Use: No  . Sexually Active: Not on file   Other Topics Concern  . Not on file   Social History Narrative   Lives in [place],  Has two sons who live with himHe is self employed.He does not have a valid driver's license because of alcohol-related in fractionsSon drives him ;has MS    Past Surgical History  Procedure Date  . Appendectomy   . Hernia repair   . Tonsilectomy, adenoidectomy, bilateral  myringotomy and tubes   . 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 Partial hip arthroplasty 2010    left    Family History  Problem Relation Age of Onset  . Arthritis Mother   . Tuberculosis Father     Allergies  Allergen Reactions  . Atorvastatin Anaphylaxis    facial swelling    Current Outpatient Prescriptions on File Prior to Visit  Medication Sig Dispense Refill  . ALPRAZolam (XANAX) 1 MG tablet Take 1 tablet (1 mg total) by mouth 3 (three) times daily as needed. For anxiety  60 tablet  0  . cetirizine (ZYRTEC) 10 MG tablet Take 10 mg by mouth daily.        Marland Kitchen gabapentin (NEURONTIN) 300 MG capsule Take 300 mg by mouth 2 (two) times daily.      Marland Kitchen omeprazole (PRILOSEC) 20 MG capsule Take 1 capsule (20 mg total) by mouth daily as needed. Heart burn  30 capsule  5  . sildenafil (VIAGRA) 100 MG tablet Take 1 tablet (100 mg total) by mouth every other day as needed for erectile dysfunction.  10 tablet  11  . simvastatin (ZOCOR) 40 MG tablet Take 1 tablet (40 mg total) by mouth at bedtime.  90 tablet  1  . eszopiclone (LUNESTA) 2 MG TABS Take  1 tablet (2 mg total) by mouth at bedtime. Take immediately before bedtime  30 tablet  1    BP 108/72  Temp 98.2 F (36.8 C) (Oral)  Wt 191 lb 11.2 oz (86.955 kg)chart    Objective:   Physical Exam  Constitutional: He is oriented to person, place, and time. He appears well-developed and well-nourished.  Neck: Normal range of motion. Neck supple.  Cardiovascular: Normal rate, regular rhythm and normal heart sounds.   No murmur heard. Pulmonary/Chest: Effort normal and breath sounds normal. No respiratory distress. He has no wheezes. He has no rales. He exhibits tenderness.       Left chest wall pain. Tenderness to palpation of the left chest anteriorly.   Abdominal: Soft. Bowel sounds are normal.  Neurological: He is alert and oriented to person, place, and time.  Skin: Skin is warm.  Psychiatric: He has a normal mood and affect.           Assessment & Plan:  Assessment: Rib fracture, left chest wall pain-uncontrolled  Plan: Percocet one tablet every 8 hours when necessary pain. X-ray of the left chest to rule out effusion since patient has been history post hip fracture. Encourage deep breathing exercises. Patient call the office if symptoms worsen or persist. Recheck a schedule, appearing.

## 2012-02-03 ENCOUNTER — Other Ambulatory Visit: Payer: Self-pay | Admitting: Internal Medicine

## 2012-02-12 ENCOUNTER — Telehealth: Payer: Self-pay | Admitting: Internal Medicine

## 2012-02-12 NOTE — Telephone Encounter (Signed)
Pt is requesting rx refill of Ibuprofen 800 mg  [facility]

## 2012-02-12 NOTE — Telephone Encounter (Signed)
This med was d/c from med list with reason: stop taking at discharge

## 2012-02-13 NOTE — Telephone Encounter (Signed)
Do not take

## 2012-02-14 NOTE — Telephone Encounter (Signed)
Pt was taking tramadol and its not working so he would like Ibf and he can get it free through his insurance

## 2012-02-15 NOTE — Telephone Encounter (Signed)
Given his other problems he should probably not take ibuprofen

## 2012-02-15 NOTE — Telephone Encounter (Signed)
Pt wants to know if there is something else he can take for pain.  Recommended tylenol but pt stated he tried that and it doesn't work either.

## 2012-02-15 NOTE — Telephone Encounter (Signed)
Caller: Victor Diaz/Patient; [name]; PCP: Birdie Sons (Adults only); Best Callback Phone Number: [phone]. 02/15/12 - Patient states that someone from the office called him a day or two ago and told him that he could not take Ibuprofen anymore. He was suppose to hear back about what he could take and has not heard back yet. Checked EPIC and the message was sent to Dr. Cato Mulligan @ 1:43 pm this afternoon. Advised patient that Dr. Cato Mulligan should address his request after he is finished seeing patients and someone should get back to him soon. Offered to triage symptoms and patient declined. Advised patient to call office back in the morning if he does not hear back.

## 2012-02-16 NOTE — Telephone Encounter (Signed)
Pt declined pain clinic.  Pt stated he will call Aluisio's office and see if he will give him Tramadol

## 2012-02-16 NOTE — Telephone Encounter (Signed)
Let's have him see pain clinic

## 2012-03-01 ENCOUNTER — Telehealth: Payer: Self-pay | Admitting: Internal Medicine

## 2012-03-01 MED ORDER — VALACYCLOVIR HCL 500 MG PO TABS: 500.0000 mg | ORAL_TABLET | Freq: Three times a day (TID) | ORAL | Status: DC

## 2012-03-01 NOTE — Telephone Encounter (Signed)
Ok to call in Valtrex?  I don't see where he has ever taken it

## 2012-03-01 NOTE — Telephone Encounter (Signed)
valcyclovir 500 mg po tid for 5 days

## 2012-03-01 NOTE — Telephone Encounter (Signed)
Caller: Victor Diaz/Patient; [name]; PCP: Birdie Sons (Adults only); Best Callback Phone Number: [phone].  He has a cold blisters on his upper lip and aroUNd his nose.  He is calling for generic pill for this to be called in for these fever blisters that started 02/28/12. Triaged Cold Sores and needs to be seen in the next 4 hrs for involvement of the tip of nose.  Pt cannot drive and cannot get a ride today.  HE IS ASKING CAN ANYTHING BE CALLED IN?  HE THINKS HE TOOK A GENERIC FOR VALTREX LAST TIME.  PLEASE CALL PT IF/WHEN MEDICATION CAN BE CALLED IN.

## 2012-03-01 NOTE — Telephone Encounter (Signed)
rx sent in electronically, pt aware 

## 2012-03-11 ENCOUNTER — Ambulatory Visit (HOSPITAL_COMMUNITY): Payer: Medicare Other | Admitting: Physical Therapy

## 2012-03-18 ENCOUNTER — Ambulatory Visit (HOSPITAL_COMMUNITY)
Admission: RE | Admit: 2012-03-18 | Discharge: 2012-03-18 | Disposition: A | Discharge status: Home / Self Care | Payer: Medicare Other | Source: Ambulatory Visit | Attending: Emergency Medicine | Admitting: Emergency Medicine

## 2012-03-18 DIAGNOSIS — M25579 Pain in unspecified ankle and joints of unspecified foot: Secondary | ICD-10-CM | POA: Insufficient documentation

## 2012-03-18 DIAGNOSIS — S82401A Unspecified fracture of shaft of right fibula, initial encounter for closed fracture: Secondary | ICD-10-CM | POA: Diagnosis present

## 2012-03-18 DIAGNOSIS — M25676 Stiffness of unspecified foot, not elsewhere classified: Secondary | ICD-10-CM | POA: Insufficient documentation

## 2012-03-18 DIAGNOSIS — S99911A Unspecified injury of right ankle, initial encounter: Secondary | ICD-10-CM | POA: Diagnosis present

## 2012-03-18 DIAGNOSIS — M6281 Muscle weakness (generalized): Secondary | ICD-10-CM | POA: Insufficient documentation

## 2012-03-18 DIAGNOSIS — M25673 Stiffness of unspecified ankle, not elsewhere classified: Secondary | ICD-10-CM | POA: Insufficient documentation

## 2012-03-18 DIAGNOSIS — R29898 Other symptoms and signs involving the musculoskeletal system: Secondary | ICD-10-CM | POA: Insufficient documentation

## 2012-03-18 DIAGNOSIS — IMO0001 Reserved for inherently not codable concepts without codable children: Secondary | ICD-10-CM | POA: Insufficient documentation

## 2012-03-18 NOTE — Evaluation (Signed)
Physical Therapy Evaluation  Patient Details  Name: Victor Diaz MRN: [id number] Date of Birth: Sep 09, 1945  Today's Date: 03/18/2012 Time: 1020-1102 PT Time Calculation (min): 42 min Charges: 1 eval, 8' TE Visit#: 1  of 6   Re-eval: 04/08/12 Assessment Diagnosis: R ankle fracture Surgical Date: 09/16/11 Next MD Visit: Dr. Lestine Box - 3 weeks  Authorization: MEDICARE  Authorization Time Period:    Authorization Visit#: 1  of 10    Past Medical History:  Past Medical History  Diagnosis Date  . Allergy   . Gout   . Hyperlipidemia   . EtOH dependence   . Hemothorax on right   . Lisfranc's dislocation 10/20/2011   Past Surgical History:  Past Surgical History  Procedure Date  . Appendectomy   . Hernia repair   . Tonsilectomy, adenoidectomy, bilateral myringotomy and tubes   . 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 Partial hip arthroplasty 2010    left    Subjective Symptoms/Limitations Symptoms: significant PMH: L hip replacement, low back pain Pertinent History: Pt is referred to PT for falling down his stairs in June and fractured his R ankle, and waited 3 weeks until he went to the doctor.  He reoprts he was in a cast for 6 weeks and then a boot for 1 month and is now ambulating with an ALSO.  he reports that 4-5 weeks ago that he was released to go back to dancing and when he was dancing he slipped and fractured his elbow. Patient Stated Goals: I want to be able to walk without the brace.  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  Comments: He enjoys shag dancing and being with his friends  Cognition/Observation Observation/Other Assessments Observations: mild ecchymosis to ankle. Significant tightness to  hamstrings, hip flexors, gastroc and solues region.  Other Assessments: L shoulder sling for fractured R elbow  Sensation/Coordination/Flexibility/Functional Tests Functional Tests Functional Tests: LEFS: 53/50  Assessment RLE AROM (degrees) RLE Overall AROM Comments: Ankle WNL RLE Strength Right Hip Flexion: 4/5 Right Hip Extension: 5/5 Right Hip ABduction: 5/5 Right Hip ADduction: 5/5 Right Knee Flexion: 4/5 Right Knee Extension: 5/5 Right Ankle Dorsiflexion: 4/5 Right Ankle Plantar Flexion: 3+/5 Right Ankle Inversion: 4/5 Right Ankle Eversion: 4/5 Palpation Palpation: unable to palpate P&T.  mild edema to lateral ankle.  Mobility/Balance  Ambulation/Gait Ambulation/Gait: Yes Ambulation/Gait Assistance: 7: Independent (w/ALSO brace on R ankle) Gait Pattern: Decreased trunk rotation;Trunk flexed Static Standing Balance Single Leg Stance - Right Leg: 2  Single Leg Stance - Left Leg: 4  Tandem Stance - Right Leg: 8  Tandem Stance - Left Leg: 10    Exercise/Treatments Ankle Stretches Soleus Stretch: 1 rep;30 seconds Gastroc Stretch: 1 rep;30 seconds Ankle Exercises - Standing SLS: 1x 30 Heel Raises: 10 reps Toe Raise: 10 reps Ankle Exercises - Supine T-Band: 4 way w/blue t-band x10 each  Physical Therapy Assessment and Plan PT Assessment and Plan Clinical Impression Statement: Pt is a 66 year old male referred to PT secondary to a R ankle fracture sustained in June w/recent elbow fracture.  Pt will benefit from skilled therapeutic intervention in order to improve on the following deficits: Abnormal gait;Decreased balance;Decreased strength;Impaired perceived functional ability Rehab Potential: Good PT Frequency: Min 2X/week PT Duration: Other (comment) (3 weeks) PT  Treatment/Interventions: Gait training;Stair training;Functional mobility training;Therapeutic activities;Therapeutic exercise;Balance training PT Plan: Activities may be complete without ankle brace.   TM for gait, Functional squats, stair training, heel/toe raises, seated towel exercises, S/L in/ev, LE stretching to decrease trunk flexion and improve hip rotation with gait.  Progress balance activities in order to improve confidence with outdoor ambulation.  F/u on order for elbow fracture.     Goals Home Exercise Program Pt will Perform Home Exercise Program: Independently PT Goal: Perform Home Exercise Program - Progress: Goal set today PT Short Term Goals Time to Complete Short Term Goals: 3 weeks PT Short Term Goal 1: Pt will improve functional strength in order to shag dance without brace or increased pain.  PT Short Term Goal 2: Pt will improve dynamic balance and demonstrate independent ambulation without ankle brace in outdoor environment to prevent falls.  PT Short Term Goal 3: Pt will improve LE gait mechanics in order to decrease risk of secondary impairments.  PT Short Term Goal 4: Pt will improve his LEFS to 68/80 for improved QOL.  PT Short Term Goal 5: Pt will improve functional ankle strength in order to ascend and descend 10 stairs w/o handrail in order to prevent fall risk.  Problem List Patient Active Problem List  Diagnosis  . HYPERLIPIDEMIA  . GOUT  . UNSPECIFIED ANEMIA  . DEPENDENCE, ALCOHOL NEC/NOS, UNSPECIFIED  . ALLERGIC RHINITIS  . GERD  . LIVER FUNCTION TESTS, ABNORMAL  . PLEURAL EFFUSION, RIGHT  . Bladder neck obstruction  . Dyspnea  . Cardiac enzymes elevated  . Fall  . Generalized weakness  . Thrombocytopenia  . Hyponatremia  . Aortic stenosis  . Cerebrovascular disease  . ASCVD (arteriosclerotic cardiovascular disease)  . Right ankle injury  . Hx of bad fall etoh contributing  . Right fibular fracture  . Leg weakness    PT Plan of Care PT Home Exercise Plan: see scanned report PT Patient Instructions: discussed importance of gait and HEP, discussed conseuling and help through [employer] Consulted and Agree with Plan of Care:  Patient  GP Functional Assessment Tool Used: LEFS: 53/80 Functional Limitation: Mobility: Walking and moving around Mobility: Walking and Moving Around Current Status ([id number]): At least 20 percent but less than 40 percent impaired, limited or restricted Mobility: Walking and Moving Around Goal Status [phone]): At least 1 percent but less than 20 percent impaired, limited or restricted  Izzy Doubek 03/18/2012, 11:24 AM  Physician Documentation Your signature is required to indicate approval of the treatment plan as stated above.  Please sign and either send electronically or make a copy of this report for your files and return this physician signed original.   Please mark one 1.__approve of plan  2. ___approve of plan with the following conditions.   ______________________________                                                          _____________________ Physician Signature  Date  

## 2012-03-19 ENCOUNTER — Ambulatory Visit (HOSPITAL_COMMUNITY): Payer: Medicare Other | Admitting: Physical Therapy

## 2012-03-21 ENCOUNTER — Ambulatory Visit (HOSPITAL_COMMUNITY)
Admission: RE | Admit: 2012-03-21 | Discharge: 2012-03-21 | Disposition: A | Discharge status: Home / Self Care | Payer: Medicare Other | Source: Ambulatory Visit | Attending: Orthopedic Surgery | Admitting: Orthopedic Surgery

## 2012-03-21 NOTE — Progress Notes (Signed)
Physical Therapy Treatment Patient Details  Name: Victor Diaz MRN: [id number] Date of Birth: 1946-03-29  Today's Date: 03/21/2012 Time: 3664-4034 PT Time Calculation (min): 47 min  Visit#: 2  of 6   Re-eval: 04/08/12 Assessment Diagnosis: R ankle fracture Surgical Date: 09/16/11 Next MD Visit: Dr. Lestine Box - 04/04/2012 Charge: gait 8', therex 38'  Authorization: Medicare  Authorization Time Period:    Authorization Visit#: 2  of 10    Subjective: Symptoms/Limitations Symptoms: Pt reported pain free today, compliance with HEP.  Entered dept with no ankle brace.  Pt stated his dr recommended not completeing the gastroc st because it increases pressure on elbow so has not completed that at home. Pain Assessment Currently in Pain?: No/denies  Objective:   Exercise/Treatments Ankle Stretches Soleus Stretch: 1 rep;30 seconds;Limitations Soleus Stretch Limitations: without UE A Gastroc Stretch: 1 rep;30 seconds;Limitations Gastroc Stretch Limitations: no UE A due to elbow pressure Aerobic Exercises Tread Mill: Gait training @ .65 cyc/sec x 8 min Ankle Exercises - Standing SLS: R 12" max of 3 Rocker Board: 2 minutes;Limitations Rocker Board Limitations: R/L Heel Raises: 15 reps Toe Raise: 15 reps Other Standing Ankle Exercises: Functional squats and stair training 4in step all directions 10x each Ankle Exercises - Seated Towel Crunch: 1 rep Towel Inversion/Eversion: 2 reps  Physical Therapy Assessment and Plan PT Assessment and Plan Clinical Impression Statement: Began PT treatment for R ankle strengthening, balance and to improve gait mechanics.  Pt able to demonstrate appropraite form and technique with most therex with manual facilitation of knee to reduce compensation and increase ankle ROM with towel exercises.  Gait training required cueing to reduce trunk flexion and increase stride length.  Pt instructed form with gastroc and soleus stretch without UE A to reduce  pressure on elbows. PT Plan: Activities may be complete without ankle brace.  Continue with gait training, LE strengthening to decrease trunk flexion and improve hip rotation with gait.  Progress balance activities in order to improve confidence with outdoor ambulation.  Next session begin SL inv/ev.    Goals    Problem List Patient Active Problem List  Diagnosis  . HYPERLIPIDEMIA  . GOUT  . UNSPECIFIED ANEMIA  . DEPENDENCE, ALCOHOL NEC/NOS, UNSPECIFIED  . ALLERGIC RHINITIS  . GERD  . LIVER FUNCTION TESTS, ABNORMAL  . PLEURAL EFFUSION, RIGHT  . Bladder neck obstruction  . Dyspnea  . Cardiac enzymes elevated  . Fall  . Generalized weakness  . Thrombocytopenia  . Hyponatremia  . Aortic stenosis  . Cerebrovascular disease  . ASCVD (arteriosclerotic cardiovascular disease)  . Right ankle injury  . Hx of bad fall etoh contributing  . Right fibular fracture  . Leg weakness    PT - End of Session Activity Tolerance: Patient tolerated treatment well General Behavior During Session: [facility] for tasks performed Cognition: [facility] for tasks performed  GP    Juel Burrow 03/21/2012, 12:00 PM

## 2012-03-25 ENCOUNTER — Ambulatory Visit (HOSPITAL_COMMUNITY)
Admission: RE | Admit: 2012-03-25 | Discharge: 2012-03-25 | Disposition: A | Discharge status: Home / Self Care | Payer: Medicare Other | Source: Ambulatory Visit | Attending: Emergency Medicine | Admitting: Emergency Medicine

## 2012-03-25 ENCOUNTER — Ambulatory Visit (HOSPITAL_COMMUNITY): Payer: Medicare Other | Admitting: Specialist

## 2012-03-25 NOTE — Progress Notes (Signed)
Physical Therapy Treatment Patient Details  Name: Victor Diaz MRN: [id number] Date of Birth: 1945-11-13  Today's Date: 03/25/2012 Time: 1015-1059 PT Time Calculation (min): 44 min Charges: 73' TE Visit#: 3  of 6   Re-eval: 04/08/12    Authorization: Medicare  Authorization Time Period:    Authorization Visit#: 3  of 10    Subjective: Symptoms/Limitations Symptoms: No complaints today.  reports he should be doing his HEP more often.  Pain Assessment Currently in Pain?: No/denies  Precautions/Restrictions     Exercise/Treatments Ankle Stretches Gastroc Stretch: 3 reps;30 seconds Gastroc Stretch Limitations: slant borad Aerobic Exercises Tread Mill: Gait training @ .65 cyc/sec x 10 min Ankle Exercises - Standing Rocker Board: 2 minutes;Limitations Rocker Board Limitations: R/L and A/P Other Standing Ankle Exercises: tandem gait, retro gait 2 RT w/min A and cueing for foot placement Other Standing Ankle Exercises: 4 in. stair: R: forward x15, lateral x10, down: x10 Ankle Exercises - Seated Towel Crunch: 1 rep  Physical Therapy Assessment and Plan PT Assessment and Plan Clinical Impression Statement: Added activities to encourage balance and strengthen hips.  Pt continues to require mod cueing to decrease trunk flexion, however is able to self correct by end of treatment and ambulates with improved posture  PT Plan: Activities may be complete without ankle brace. Discussed possible D/C next session.     Goals    Problem List Patient Active Problem List  Diagnosis  . HYPERLIPIDEMIA  . GOUT  . UNSPECIFIED ANEMIA  . DEPENDENCE, ALCOHOL NEC/NOS, UNSPECIFIED  . ALLERGIC RHINITIS  . GERD  . LIVER FUNCTION TESTS, ABNORMAL  . PLEURAL EFFUSION, RIGHT  . Bladder neck obstruction  . Dyspnea  . Cardiac enzymes elevated  . Fall  . Generalized weakness  . Thrombocytopenia  . Hyponatremia  . Aortic stenosis  . Cerebrovascular disease  . ASCVD (arteriosclerotic  cardiovascular disease)  . Right ankle injury  . Hx of bad fall etoh contributing  . Right fibular fracture  . Leg weakness    PT - End of Session Activity Tolerance: Patient tolerated treatment well General Behavior During Session: [facility] for tasks performed Cognition: [facility] for tasks performed PT Plan of Care Consulted and Agree with Plan of Care: Patient  GP    Niles Ess, PT 03/25/2012, 11:03 AM

## 2012-03-27 ENCOUNTER — Ambulatory Visit (HOSPITAL_COMMUNITY)
Admission: RE | Admit: 2012-03-27 | Discharge: 2012-03-27 | Disposition: A | Discharge status: Home / Self Care | Payer: Medicare Other | Source: Ambulatory Visit | Attending: Orthopedic Surgery | Admitting: Orthopedic Surgery

## 2012-03-27 ENCOUNTER — Ambulatory Visit (HOSPITAL_COMMUNITY): Payer: Medicare Other | Admitting: Specialist

## 2012-03-27 NOTE — Progress Notes (Signed)
Physical Therapy Re-Evaluation  Patient Details  Name: Victor Diaz MRN: [id number] Date of Birth: [birth year]  Today's Date: 03/27/2012 Time: 1914-7829 PT Time Calculation (min): 50 min Charge: MMT x 1 unit, Gait x 15', therex 30 Visit#: 4  of 8   Re-eval: 04/08/12 Assessment Diagnosis: R ankle fracture Surgical Date: 09/16/11 Next MD Visit: Dr. Lestine Box - 04/04/2012  Authorization: Medicare  Authorization Time Period:    Authorization Visit#: 4  of 10    Subjective Symptoms/Limitations Symptoms: No compliants today.  Pain free. Pain Assessment Currently in Pain?: No/denies  Objective:  Sensation/Coordination/Flexibility/Functional Tests Functional Tests Functional Tests: LEFS 66/80 (LEFS was 53/80)  Assessment RLE AROM (degrees) RLE Overall AROM Comments: Ankle WNL RLE Strength Right Hip Flexion: 5/5 (was 4/5) Right Knee Flexion: 5/5 (was 4/5) Right Ankle Dorsiflexion: 5/5 (was 4/5) Right Ankle Plantar Flexion:  (4+/5 was 3+/5) Right Ankle Inversion:  (4+/5 was 4/5) Right Ankle Eversion:  (4+/5 was 4/5)  Exercise/Treatments Ankle Exercises - Standing Heel Raises: 15 reps;Limitations Heel Raises Limitations: R LE only with HHA Toe Raise: 15 reps;Limitations Toe Raise Limitations: R LE only with HHA Heel Walk (Round Trip): 1 Toe Walk (Round Trip): 1 Other Standing Ankle Exercises: outdoor gait training x 12 min with incline/decline slopes, steps with 1 HR, through grass, up and down hills, curbs....  Reciprocal stair training R Other Standing Ankle Exercises: 4 in. stair: R: forward x15, lateral x15, down: x15 Ankle Exercises - Seated Towel Crunch: 1 rep Towel Inversion/Eversion: 2 reps  Physical Therapy Assessment and Plan PT Assessment and Plan Clinical Impression Statement: Reassessment complete; Victor Diaz has had 4 OPPT sessions over 1 and 1/2 weeks with the following findings:  Pt has met 4/5 STG and progressing towards the other.  Pt compliant and  able to demonstrate appropriate technique with all HEP.  Pt able to demonstrate improved gait mechanics on dynamic surfaces indoor and outdoor settings, strength has returned to [facility] with ability to demonstrate reciprocal pattern on stairs with no LOB episodes,  Pt with improve perceived lower extremity functional scale, pt reports return to recreational activiities.  Pt reported has returned to [facility] dancing without brace and no increased pain.   PT Plan: D/C to HEP per all goals met.    Goals Home Exercise Program Pt will Perform Home Exercise Program: Independently: Met PT Short Term Goals: 3 weeks PT Short Term Goal 1: Pt will improve functional strength in order to shag dance without brace or increased pain.: Met (reports 3 shag dances w/o brace no increased pain) PT Short Term Goal 2: Pt will improve dynamic balance and demonstrate independent ambulation without ankle brace in outdoor environment to prevent falls.: Met PT Short Term Goal 3: Pt will improve LE gait mechanics in order to decrease risk of secondary impairments.: Met PT Short Term Goal 4: Pt will improve his LEFS to 68/80 for improved QOL.: Progressing toward goal PT Short Term Goal 5: Pt will improve functional ankle strength in order to ascend and descend 10 stairs w/o handrail in order to prevent fall risk.: Met  Problem List Patient Active Problem List  Diagnosis  . HYPERLIPIDEMIA  . GOUT  . UNSPECIFIED ANEMIA  . DEPENDENCE, ALCOHOL NEC/NOS, UNSPECIFIED  . ALLERGIC RHINITIS  . GERD  . LIVER FUNCTION TESTS, ABNORMAL  . PLEURAL EFFUSION, RIGHT  . Bladder neck obstruction  . Dyspnea  . Cardiac enzymes elevated  . Fall  . Generalized weakness  . Thrombocytopenia  . Hyponatremia  . Aortic stenosis  .  Cerebrovascular disease  . ASCVD (arteriosclerotic cardiovascular disease)  . Right ankle injury  . Hx of bad fall etoh contributing  . Right fibular fracture  . Leg weakness    PT - End of Session Activity  Tolerance: Patient tolerated treatment well General Behavior During Session: [facility] for tasks performed Cognition: [facility] for tasks performed  GP Functional Assessment Tool Used: LEFS 66/80 (was 53/80) Functional Limitation: Mobility: Walking and moving around Mobility: Walking and Moving Around Goal Status [phone]): At least 1 percent but less than 20 percent impaired, limited or restricted Mobility: Walking and Moving Around Discharge Status [phone]): At least 1 percent but less than 20 percent impaired, limited or restricted  Juel Burrow 03/27/2012, 10:47 AM

## 2012-04-02 ENCOUNTER — Encounter: Payer: Self-pay | Admitting: Family

## 2012-04-02 ENCOUNTER — Ambulatory Visit (INDEPENDENT_AMBULATORY_CARE_PROVIDER_SITE_OTHER): Payer: Medicare Other | Admitting: Family

## 2012-04-02 VITALS — BP 120/90 | HR 88 | Temp 98.1°F | Wt 194.0 lb

## 2012-04-02 DIAGNOSIS — Z79899 Other long term (current) drug therapy: Secondary | ICD-10-CM

## 2012-04-02 DIAGNOSIS — F411 Generalized anxiety disorder: Secondary | ICD-10-CM

## 2012-04-02 DIAGNOSIS — G8929 Other chronic pain: Secondary | ICD-10-CM

## 2012-04-02 DIAGNOSIS — F419 Anxiety disorder, unspecified: Secondary | ICD-10-CM

## 2012-04-02 DIAGNOSIS — G4700 Insomnia, unspecified: Secondary | ICD-10-CM

## 2012-04-02 DIAGNOSIS — M549 Dorsalgia, unspecified: Secondary | ICD-10-CM

## 2012-04-02 MED ORDER — ALPRAZOLAM 1 MG PO TABS: 1.0000 mg | ORAL_TABLET | Freq: Three times a day (TID) | ORAL | Status: DC | PRN

## 2012-04-02 MED ORDER — PAROXETINE HCL 20 MG PO TABS: 20.0000 mg | ORAL_TABLET | ORAL | Status: DC

## 2012-04-02 MED ORDER — ZOLPIDEM TARTRATE 10 MG PO TABS: 10.0000 mg | ORAL_TABLET | Freq: Every evening | ORAL | Status: DC | PRN

## 2012-04-02 NOTE — Progress Notes (Signed)
Subjective:    Patient ID: Victor Diaz, male    DOB: [birth year], 66 y.o.   MRN: [id number]  HPI  66 year old white male, nonsmoker, patient of Dr. Cato Mulligan is in today for a refill of medications. He has a history of Anxiety, Insomnia, GERD, and chronic back pain. Back Pain is being managed by [facility]. He could not afford Lunesta and went back on Ambien. Tolerates all medications well. Takes Ambien and Xanax at bedtime to help him sleep. He continues to be anxious despite Xanax.  Denies feelings of helplessness, hopelessness, thoughts of death or dying.  Review of Systems  Constitutional: Negative.   Respiratory: Negative.   Cardiovascular: Negative.   Gastrointestinal: Negative.   Musculoskeletal: Negative.   Skin: Negative.   Neurological: Negative.   Hematological: Negative.   Psychiatric/Behavioral: Positive for sleep disturbance and agitation. The patient is nervous/anxious.    Past Medical History  Diagnosis Date  . Allergy   . Gout   . Hyperlipidemia   . EtOH dependence   . Hemothorax on right   . Lisfranc's dislocation 10/20/2011    History   Social History  . Marital Status: Widowed    Spouse Name: N/A    Number of Children: N/A  . Years of Education: N/A   Occupational History  . Not on file.   Social History Main Topics  . Smoking status: Former Smoker -- 1.0 packs/day for 10 years    Quit date: 08/09/1969  . Smokeless tobacco: Never Used  . Alcohol Use: Yes  . Drug Use: No  . Sexually Active: Not on file   Other Topics Concern  . Not on file   Social History Narrative   Lives in [place],  Has two sons who live with himHe is self employed.He does not have a valid driver's license because of alcohol-related in fractionsSon drives him ;has MS    Past Surgical History  Procedure Date  . Appendectomy   . Hernia repair   . Tonsilectomy, adenoidectomy, bilateral myringotomy and tubes   . 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  Partial hip arthroplasty 2010    left    Family History  Problem Relation Age of Onset  . Arthritis Mother   . Tuberculosis Father     Allergies  Allergen Reactions  . Atorvastatin Anaphylaxis    facial swelling    Current Outpatient Prescriptions on File Prior to Visit  Medication Sig Dispense Refill  . gabapentin (NEURONTIN) 300 MG capsule Take 300 mg by mouth 2 (two) times daily.      Marland Kitchen omeprazole (PRILOSEC) 20 MG capsule Take 1 capsule (20 mg total) by mouth daily as needed. Heart burn  30 capsule  5  . simvastatin (ZOCOR) 40 MG tablet Take 1 tablet (40 mg total) by mouth at bedtime.  90 tablet  1  . valACYclovir (VALTREX) 500 MG tablet Take 1 tablet (500 mg total) by mouth 3 (three) times daily.  15 tablet  0  . ZYRTEC 10 MG tablet TAKE 1 TABLET BY MOUTH ONCE A DAY AS NEEDED.  90 tablet  3  . oxyCODONE-acetaminophen (ROXICET) 5-325 MG per tablet Take 1 tablet by mouth every 8 (eight) hours as needed for pain.  30 tablet  0  . PARoxetine (PAXIL) 20 MG tablet Take 1 tablet (20 mg total) by mouth every morning.  30 tablet  3  . sildenafil (VIAGRA) 100 MG tablet Take 1 tablet (100 mg total) by mouth every other day  as needed for erectile dysfunction.  10 tablet  11    BP 120/90  Pulse 88  Temp 98.1 F (36.7 C) (Oral)  Wt 194 lb (87.998 kg)  SpO2 97%chart    Objective:   Physical Exam  Constitutional: He is oriented to person, place, and time. He appears well-developed and well-nourished.  HENT:  Right Ear: External ear normal.  Left Ear: External ear normal.  Nose: Nose normal.  Mouth/Throat: Oropharynx is clear and moist.  Eyes: Conjunctivae normal and EOM are normal. Pupils are equal, round, and reactive to light.  Neck: Normal range of motion.  Cardiovascular: Normal rate, regular rhythm and normal heart sounds.   Pulmonary/Chest: Effort normal and breath sounds normal.  Abdominal: Soft.  Musculoskeletal: Normal range of motion.  Neurological: He is alert and  oriented to person, place, and time.  Skin: Skin is warm and dry.  Psychiatric: He has a normal mood and affect.          Assessment & Plan:  Assessment: Anxiety, insomnia  Plan: Spoke with patient at length about using multiple providers to prescribe his control medications. Orthopedics will prescribe all pain medication for him. We'll stop Paxil 20 mg once daily and hopefully he won't require as much Xanax 1 mg. Xanax was decreased to 1-2 tablets once a day in conjunction with Paxil. Ambien at bedtime as needed. See Dr. Cato Mulligan in 3 weeks to see how he is doing with Paxil. Advised that Xanax and Remus Loffler have the potential to be habit forming used long term and we should consider decreasing the frequency.

## 2012-04-02 NOTE — Patient Instructions (Signed)

## 2012-04-03 ENCOUNTER — Encounter: Payer: Self-pay | Admitting: Family

## 2012-04-04 ENCOUNTER — Ambulatory Visit: Payer: Medicare Other | Admitting: Family

## 2012-04-23 ENCOUNTER — Ambulatory Visit: Payer: Medicare Other | Admitting: Family

## 2012-04-26 ENCOUNTER — Telehealth: Payer: Self-pay | Admitting: Family

## 2012-04-26 NOTE — Telephone Encounter (Signed)
Error/kjh 

## 2012-04-30 ENCOUNTER — Ambulatory Visit (INDEPENDENT_AMBULATORY_CARE_PROVIDER_SITE_OTHER): Payer: Medicare Other | Admitting: Family

## 2012-04-30 ENCOUNTER — Encounter: Payer: Self-pay | Admitting: Family

## 2012-04-30 ENCOUNTER — Ambulatory Visit: Payer: Medicare Other | Admitting: Family

## 2012-04-30 VITALS — BP 112/74 | HR 79 | Wt 189.0 lb

## 2012-04-30 DIAGNOSIS — G4700 Insomnia, unspecified: Secondary | ICD-10-CM

## 2012-04-30 DIAGNOSIS — R0602 Shortness of breath: Secondary | ICD-10-CM

## 2012-04-30 DIAGNOSIS — F419 Anxiety disorder, unspecified: Secondary | ICD-10-CM

## 2012-04-30 DIAGNOSIS — R42 Dizziness and giddiness: Secondary | ICD-10-CM

## 2012-04-30 DIAGNOSIS — F411 Generalized anxiety disorder: Secondary | ICD-10-CM

## 2012-04-30 DIAGNOSIS — R0989 Other specified symptoms and signs involving the circulatory and respiratory systems: Secondary | ICD-10-CM

## 2012-04-30 LAB — CBC WITH DIFFERENTIAL/PLATELET
Basophils Relative: 0.6 % (ref 0.0–3.0)
Eosinophils Absolute: 0.2 10*3/uL (ref 0.0–0.7)
MCHC: 34.2 g/dL (ref 30.0–36.0)
MCV: 98.2 fl (ref 78.0–100.0)
Monocytes Absolute: 0.8 10*3/uL (ref 0.1–1.0)
Neutrophils Relative %: 64.3 % (ref 43.0–77.0)
Platelets: 198 10*3/uL (ref 150.0–400.0)
RDW: 14 % (ref 11.5–14.6)

## 2012-04-30 LAB — HEPATIC FUNCTION PANEL
ALT: 41 U/L (ref 0–53)
Alkaline Phosphatase: 47 U/L (ref 39–117)
Bilirubin, Direct: 0.1 mg/dL (ref 0.0–0.3)
Total Protein: 7.6 g/dL (ref 6.0–8.3)

## 2012-04-30 LAB — BASIC METABOLIC PANEL
CO2: 25 mEq/L (ref 19–32)
Chloride: 100 mEq/L (ref 96–112)
Sodium: 134 mEq/L — ABNORMAL LOW (ref 135–145)

## 2012-04-30 NOTE — Patient Instructions (Signed)

## 2012-04-30 NOTE — Progress Notes (Signed)
Subjective:    Patient ID: Victor Diaz, male    DOB: 01/28/1946, 67 y.o.   MRN: [id number]  HPI 67 year old white male, nonsmoker, patient of Dr. Cato Mulligan is in today for recheck on anxiety. He was prescribed Paxil 20 mg once a day last month. He reports taken 10 days of the medication and discontinued it. However, he would like to resume the medication this month. Denies any ill effects. Continue to take Xanax that helps and Ambien for sleep.  Patient reports an acute episode of lightheadedness and dizziness after  walking up a flight of steps carrying her Christmas tree that resolved after he laid down. patient reports similar symptoms approximately one year ago and was referred to cardiology and instructed to get a carotid Doppler. Patient reports canceling his appointments. Denies any chest pain, nausea or vomiting. Mother deceased related to brain aneurysm. Father deceased of tuberculosis. Paternal grandfather deceased of myocardial infarction    Review of Systems  Constitutional: Negative.   HENT: Negative.   Respiratory: Positive for shortness of breath. Negative for cough and wheezing.   Cardiovascular: Negative.  Negative for chest pain, palpitations and leg swelling.  Gastrointestinal: Negative.   Genitourinary: Negative.   Musculoskeletal: Negative.   Skin: Negative.   Neurological: Negative.   Hematological: Negative.   Psychiatric/Behavioral: Negative.    Past Medical History  Diagnosis Date  . Allergy   . Gout   . Hyperlipidemia   . EtOH dependence   . Hemothorax on right   . Lisfranc's dislocation 10/20/2011    History   Social History  . Marital Status: Widowed    Spouse Name: N/A    Number of Children: N/A  . Years of Education: N/A   Occupational History  . Not on file.   Social History Main Topics  . Smoking status: Former Smoker -- 1.0 packs/day for 10 years    Quit date: 08/09/1969  . Smokeless tobacco: Never Used  . Alcohol Use: Yes  . Drug Use:  No  . Sexually Active: Not on file   Other Topics Concern  . Not on file   Social History Narrative   Lives in [place],  Has two sons who live with himHe is self employed.He does not have a valid driver's license because of alcohol-related in fractionsSon drives him ;has MS    Past Surgical History  Procedure Date  . Appendectomy   . Hernia repair   . Tonsilectomy, adenoidectomy, bilateral myringotomy and tubes   . 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 Partial hip arthroplasty 2010    left    Family History  Problem Relation Age of Onset  . Arthritis Mother   . Tuberculosis Father     Allergies  Allergen Reactions  . Atorvastatin Anaphylaxis    facial swelling    Current Outpatient Prescriptions on File Prior to Visit  Medication Sig Dispense Refill  . ALPRAZolam (XANAX) 1 MG tablet Take 1 tablet (1 mg total) by mouth 3 (three) times daily as needed. For anxiety  60 tablet  1  . gabapentin (NEURONTIN) 300 MG capsule Take 300 mg by mouth 2 (two) times daily.      Marland Kitchen omeprazole (PRILOSEC) 20 MG capsule Take 1 capsule (20 mg total) by mouth daily as needed. Heart burn  30 capsule  5  . oxyCODONE-acetaminophen (ROXICET) 5-325 MG per tablet Take 1 tablet by mouth every 8 (eight) hours as needed for pain.  30 tablet  0  . simvastatin (ZOCOR) 40  MG tablet Take 1 tablet (40 mg total) by mouth at bedtime.  90 tablet  1  . valACYclovir (VALTREX) 500 MG tablet Take 1 tablet (500 mg total) by mouth 3 (three) times daily.  15 tablet  0  . zolpidem (AMBIEN) 10 MG tablet Take 1 tablet (10 mg total) by mouth at bedtime as needed for sleep.  30 tablet  1  . ZYRTEC 10 MG tablet TAKE 1 TABLET BY MOUTH ONCE A DAY AS NEEDED.  90 tablet  3  . PARoxetine (PAXIL) 20 MG tablet Take 1 tablet (20 mg total) by mouth every morning.  30 tablet  3  . sildenafil (VIAGRA) 100 MG tablet Take 1 tablet (100 mg total) by mouth every other day as needed for erectile dysfunction.  10 tablet  11    BP  112/74  Pulse 79  Wt 189 lb (85.73 kg)  SpO2 97%chart    Objective:   Physical Exam  Constitutional: He is oriented to person, place, and time. He appears well-developed and well-nourished.  HENT:  Right Ear: External ear normal.  Left Ear: External ear normal.  Nose: Nose normal.  Mouth/Throat: Oropharynx is clear and moist.  Eyes: Conjunctivae normal are normal. Pupils are equal, round, and reactive to light.  Neck: Normal range of motion. Neck supple.       Carotid bruits noted bilaterally  Cardiovascular: Normal rate, regular rhythm and normal heart sounds.   Pulmonary/Chest: Effort normal and breath sounds normal.  Abdominal: Soft. Bowel sounds are normal.  Neurological: He is alert and oriented to person, place, and time.  Skin: Skin is warm and dry.  Psychiatric: He has a normal mood and affect.          Assessment & Plan:  Assessment: Anxiety, Shortness of breath, Carotid bruit, Insomnia, Lightheadedness  Plan: I will reschedule patient's carotid Doppler study that was ordered last year. Refer to cardiology for possible stress test. Advice as an emergency department if he experiences similar symptoms. Resume Paxil 20 mg once daily. Lab sent to include BMP, CBC, LFTs, TSH will notify patient pending results. Advised patient to call the office with any questions or concerns. Recheck a schedule, and as needed.

## 2012-05-01 ENCOUNTER — Other Ambulatory Visit: Payer: Self-pay

## 2012-05-01 DIAGNOSIS — R0602 Shortness of breath: Secondary | ICD-10-CM

## 2012-05-01 DIAGNOSIS — R42 Dizziness and giddiness: Secondary | ICD-10-CM

## 2012-05-23 ENCOUNTER — Telehealth: Payer: Self-pay | Admitting: Internal Medicine

## 2012-05-23 NOTE — Telephone Encounter (Signed)
Formulary exception request initiated. Will f/u with patient and outcome. If denied and pt must may cash, it will be $17.76 for 60 pills/month. Encounter closed.

## 2012-05-23 NOTE — Telephone Encounter (Signed)
This patient's ALPRAZolam (XANAX) 1 MG tablet is considered a non-covered drug by his Part D plan. I do not show he has tried and failed anything else. They will not cover the alprazolam without documentation of a try/fail OR a reason why nothing else will be effective for the patient.  Some others that they will cover:  Lorazepam (Tier 2 generic) Clonazepam & Diazepam (both Tier 4 non-preferred)  Please let me know if switching to one of these is a viable alternative. If not, and if his insurance will not cover the Xanax, he will need to pay cash. Thank you.

## 2012-05-23 NOTE — Telephone Encounter (Signed)
No change in meds This should be very inexpensive

## 2012-05-24 ENCOUNTER — Telehealth: Payer: Self-pay | Admitting: Internal Medicine

## 2012-05-24 NOTE — Telephone Encounter (Signed)
Pt does not needs a prior authorization for a quanity of 60 for ALPRAZolam (XANAX) 1 MG tablet   .  Pt is allowed up to 120 every 30 days.

## 2012-05-28 ENCOUNTER — Telehealth: Payer: Self-pay | Admitting: Internal Medicine

## 2012-05-28 DIAGNOSIS — F411 Generalized anxiety disorder: Secondary | ICD-10-CM

## 2012-05-28 DIAGNOSIS — F1010 Alcohol abuse, uncomplicated: Secondary | ICD-10-CM

## 2012-05-28 NOTE — Telephone Encounter (Signed)
Pt needs refill on alprazolam 1 mg call into [facility] in [place]

## 2012-05-28 NOTE — Telephone Encounter (Signed)
This was written by [place].  Ok to refill?

## 2012-05-30 NOTE — Telephone Encounter (Signed)
Ok to refill for one month. Refer to psychiatry for ongoing treatment of anxiety and alcohol abuse

## 2012-06-03 MED ORDER — ALPRAZOLAM 1 MG PO TABS: 1.0000 mg | ORAL_TABLET | Freq: Three times a day (TID) | ORAL | Status: DC | PRN

## 2012-06-03 MED ORDER — ZOLPIDEM TARTRATE 10 MG PO TABS: 10.0000 mg | ORAL_TABLET | Freq: Every evening | ORAL | Status: DC | PRN

## 2012-06-03 NOTE — Telephone Encounter (Signed)
Pt aware, rx called in, referral order placed

## 2012-06-10 ENCOUNTER — Telehealth: Payer: Self-pay | Admitting: Internal Medicine

## 2012-06-10 NOTE — Telephone Encounter (Signed)
Patient calling to find out where the carotid doppler study should be scheduled at?  He had to cancel the appointment.  Per notes, it was at [facility].  Also needed the name of the psychiatrist that he was referred to.  Same is Dr. Len Blalock in [place].  I have him the office numbers.

## 2012-06-11 ENCOUNTER — Telehealth: Payer: Self-pay | Admitting: Internal Medicine

## 2012-06-11 NOTE — Telephone Encounter (Signed)
Anybody in particular you want him to see?

## 2012-06-11 NOTE — Telephone Encounter (Signed)
Patient was referred to a psychiatrist, Dr. Toni Arthurs. He has tried that office, and Dr. Toni Arthurs is not there anymore. He would like a referral to someone else. In [place], or close as possible (if possible), since he doesn't drive and his son has to transport him. Please call pt w/name of new psychiatrist. Thank you.

## 2012-06-12 NOTE — Telephone Encounter (Signed)
Any psychiatrist to be fine.

## 2012-06-12 NOTE — Telephone Encounter (Signed)
Left message on machine Dr Peggye Fothergill number [phone]-[facility] [facility]- also can call health department they have 2 psychiatrist- [phone]

## 2012-07-03 ENCOUNTER — Other Ambulatory Visit: Payer: Self-pay | Admitting: *Deleted

## 2012-07-03 MED ORDER — ALPRAZOLAM 1 MG PO TABS: 1.0000 mg | ORAL_TABLET | Freq: Three times a day (TID) | ORAL | Status: DC | PRN

## 2012-07-30 ENCOUNTER — Telehealth: Payer: Self-pay | Admitting: Internal Medicine

## 2012-07-30 NOTE — Telephone Encounter (Signed)
°  Pt calling about referral to Psy.  Per Epic pt given Dr Toni Arthurs address and phone number.  Pt states that he really does not want to go see this other doctor.  He is worried about his xanex and [place].  Pt encouraged to make appt for eval

## 2012-07-31 ENCOUNTER — Other Ambulatory Visit: Payer: Self-pay | Admitting: *Deleted

## 2012-07-31 MED ORDER — ALPRAZOLAM 1 MG PO TABS: 1.0000 mg | ORAL_TABLET | Freq: Three times a day (TID) | ORAL | Status: DC | PRN

## 2012-07-31 MED ORDER — ZOLPIDEM TARTRATE 10 MG PO TABS: 10.0000 mg | ORAL_TABLET | Freq: Every evening | ORAL | Status: DC | PRN

## 2012-07-31 NOTE — Telephone Encounter (Signed)
Gave pt Dr. Alveda Reasons name and number.  He is asking that Dr Cato Mulligan give him one months refill on both medicines, he stated he really needed them. I noticed his speech was somewhat slurred

## 2012-08-01 NOTE — Telephone Encounter (Signed)
F/u with psych

## 2012-08-05 NOTE — Telephone Encounter (Signed)
Pt aware, he can not see Dr Toni Arthurs, Joni Reining can you call pt and give some names and numbers for psychiatry?

## 2012-08-08 NOTE — Telephone Encounter (Signed)
°  Kathryne Sharper, MD  Psychiatrist [facility]  [address] [place], [place] [phone]

## 2012-09-05 ENCOUNTER — Ambulatory Visit (HOSPITAL_COMMUNITY): Payer: Medicare Other | Admitting: Psychiatry

## 2012-09-10 ENCOUNTER — Encounter (HOSPITAL_COMMUNITY): Payer: Self-pay

## 2012-09-10 ENCOUNTER — Emergency Department (HOSPITAL_COMMUNITY): Payer: Medicare Other

## 2012-09-10 ENCOUNTER — Emergency Department (HOSPITAL_COMMUNITY)
Admission: EM | Admit: 2012-09-10 | Discharge: 2012-09-10 | Disposition: A | Discharge status: Home / Self Care | Payer: Medicare Other | Attending: Emergency Medicine | Admitting: Emergency Medicine

## 2012-09-10 DIAGNOSIS — Y9389 Activity, other specified: Secondary | ICD-10-CM | POA: Insufficient documentation

## 2012-09-10 DIAGNOSIS — F1020 Alcohol dependence, uncomplicated: Secondary | ICD-10-CM | POA: Insufficient documentation

## 2012-09-10 DIAGNOSIS — Z862 Personal history of diseases of the blood and blood-forming organs and certain disorders involving the immune mechanism: Secondary | ICD-10-CM | POA: Insufficient documentation

## 2012-09-10 DIAGNOSIS — Z8639 Personal history of other endocrine, nutritional and metabolic disease: Secondary | ICD-10-CM | POA: Insufficient documentation

## 2012-09-10 DIAGNOSIS — W19XXXA Unspecified fall, initial encounter: Secondary | ICD-10-CM

## 2012-09-10 DIAGNOSIS — Y929 Unspecified place or not applicable: Secondary | ICD-10-CM | POA: Insufficient documentation

## 2012-09-10 DIAGNOSIS — S0003XA Contusion of scalp, initial encounter: Secondary | ICD-10-CM | POA: Insufficient documentation

## 2012-09-10 DIAGNOSIS — W1809XA Striking against other object with subsequent fall, initial encounter: Secondary | ICD-10-CM | POA: Insufficient documentation

## 2012-09-10 DIAGNOSIS — Z8709 Personal history of other diseases of the respiratory system: Secondary | ICD-10-CM | POA: Insufficient documentation

## 2012-09-10 DIAGNOSIS — Z87891 Personal history of nicotine dependence: Secondary | ICD-10-CM | POA: Insufficient documentation

## 2012-09-10 DIAGNOSIS — E785 Hyperlipidemia, unspecified: Secondary | ICD-10-CM | POA: Insufficient documentation

## 2012-09-10 NOTE — ED Provider Notes (Addendum)
History     This chart was scribed for Victor Booze, MD, MD by Smitty Pluck, ED Scribe. The patient was seen in room APA07/APA07 and the patient's care was started at 2:13 PM.   CSN: [id number]  Arrival date & time 09/10/12  1342   Chief Complaint  Patient presents with  . Fall     The history is provided by the patient and medical records. No language interpreter was used.   HPI Comments: Victor Diaz is a 67 y.o. male who presents to the Emergency Department complaining of fall 1 week ago. Pt reports that he was getting out of bed and fell backwards hitting his head. He states that he was drinking before the fall. He is unsure how much alcohol he had to drink that night.  He reports having mild head pain at occiput radiating to right ear that has been constant since the fall.  He reports that he drinks 4-5 beers/day. Pt denies LOC, dizziness, visual disturbance, fever, chills, nausea, vomiting, diarrhea, weakness, cough, SOB and any other pain.   PCP is Dr. Cato Mulligan   Past Medical History  Diagnosis Date  . Allergy   . Gout   . Hyperlipidemia   . EtOH dependence   . Hemothorax on right   . Lisfranc's dislocation 10/20/2011    Past Surgical History  Procedure Laterality Date  . Appendectomy    . Hernia repair    . Tonsilectomy, adenoidectomy, bilateral myringotomy and tubes    . 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 Partial hip arthroplasty  2010    left    Family History  Problem Relation Age of Onset  . Arthritis Mother   . Tuberculosis Father     History  Substance Use Topics  . Smoking status: Former Smoker -- 1.00 packs/day for 10 years    Quit date: 08/09/1969  . Smokeless tobacco: Never Used  . Alcohol Use: Yes      Review of Systems  All other systems reviewed and are negative.    Allergies  Atorvastatin  Home Medications   Current Outpatient Rx  Name  Route  Sig  Dispense  Refill  . ALPRAZolam (XANAX) 1 MG tablet   Oral   Take 1  tablet (1 mg total) by mouth 3 (three) times daily as needed. For anxiety   60 tablet   1   . ibuprofen (ADVIL,MOTRIN) 800 MG tablet               . naproxen (NAPROSYN) 375 MG tablet               . omeprazole (PRILOSEC) 20 MG capsule   Oral   Take 20 mg by mouth daily as needed (heartburn). Heart burn         . simvastatin (ZOCOR) 40 MG tablet   Oral   Take 1 tablet (40 mg total) by mouth at bedtime.   90 tablet   1   . traMADol (ULTRAM) 50 MG tablet               . zolpidem (AMBIEN) 10 MG tablet   Oral   Take 1 tablet (10 mg total) by mouth at bedtime as needed for sleep.   30 tablet   1   . ZYRTEC 10 MG tablet      TAKE 1 TABLET BY MOUTH ONCE A DAY AS NEEDED.   90 tablet   3   . EXPIRED: sildenafil (VIAGRA) 100 MG  tablet   Oral   Take 1 tablet (100 mg total) by mouth every other day as needed for erectile dysfunction.   10 tablet   11     BP 96/73  Pulse 80  Temp(Src) 97 F (36.1 C) (Oral)  Resp 20  Ht 5\' 6"  (1.676 m)  Wt 180 lb (81.647 kg)  BMI 29.07 kg/m2  SpO2 98%  Physical Exam  Nursing note and vitals reviewed. Constitutional: He is oriented to person, place, and time. He appears well-developed and well-nourished. No distress.  HENT:  Head: Normocephalic and atraumatic.  Mild tenderness to left side of occiput  Eyes: Conjunctivae are normal.  Neck: Normal range of motion.  Pulmonary/Chest: Effort normal. No respiratory distress.  Neurological: He is alert and oriented to person, place, and time.  Skin: Skin is warm and dry.  Psychiatric: He has a normal mood and affect. His behavior is normal.    ED Course  Procedures (including critical care time) DIAGNOSTIC STUDIES: Oxygen Saturation is 98% on room air, normal by my interpretation.    COORDINATION OF CARE: 2:17 PM Discussed ED treatment with pt and pt agrees.     Labs Reviewed - No data to display Ct Head Wo Contrast  09/10/2012   *RADIOLOGY REPORT*  Clinical Data:  Fall, head injury, pain  CT HEAD WITHOUT CONTRAST,CT CERVICAL SPINE WITHOUT CONTRAST  Technique:  Contiguous axial images were obtained from the base of the skull through the vertex without contrast.,Technique: Multidetector CT imaging of the cervical spine was performed. Multiplanar CT image reconstructions were also generated.  Comparison: 01/09/2011  Findings: Stable diffuse brain atrophy and minor periventricular white matter ischemic changes.  No acute intracranial hemorrhage, mass lesion, infarction, focal edema, or mass effect.  Negative for hydrocephalus, or extra-axial fluid collection.  Gray-white matter differentiation maintained.  Cisterns patent.  Cerebellar atrophy as well.  No orbital abnormality.  Posterior right parietal scalp bruising evident.  No underlying skull fracture.  Mastoids clear. Minor scattered sinus mucosal thickening.  IMPRESSION: Stable atrophy and chronic microvascular white matter ischemic changes.  Right parietal scalp bruising.  No acute intracranial process.  CT CERVICAL SPINE WITHOUT CONTRAST  Technique:  Multidetector CT imaging of the cervical spine was performed without intravenous contrast.  Multiplanar CT image reconstructions were also generated.  Comparison:  01/09/2011  Findings:  Normal alignment without subluxation or dislocation. Facets aligned.  No compression fracture, wedge shaped deformity or focal kyphosis.  Diffuse degenerative changes of all levels, most pronounced from C2 to C6.  IMPRESSION: No acute osseous finding or fracture.  Stable degenerative changes and spondylosis.   Original Report Authenticated By: Judie Petit. Miles Costain, M.D.   Ct Cervical Spine Wo Contrast  09/10/2012   *RADIOLOGY REPORT*  Clinical Data: Fall, head injury, pain  CT HEAD WITHOUT CONTRAST,CT CERVICAL SPINE WITHOUT CONTRAST  Technique:  Contiguous axial images were obtained from the base of the skull through the vertex without contrast.,Technique: Multidetector CT imaging of the cervical spine  was performed. Multiplanar CT image reconstructions were also generated.  Comparison: 01/09/2011  Findings: Stable diffuse brain atrophy and minor periventricular white matter ischemic changes.  No acute intracranial hemorrhage, mass lesion, infarction, focal edema, or mass effect.  Negative for hydrocephalus, or extra-axial fluid collection.  Gray-white matter differentiation maintained.  Cisterns patent.  Cerebellar atrophy as well.  No orbital abnormality.  Posterior right parietal scalp bruising evident.  No underlying skull fracture.  Mastoids clear. Minor scattered sinus mucosal thickening.  IMPRESSION: Stable atrophy and chronic  microvascular white matter ischemic changes.  Right parietal scalp bruising.  No acute intracranial process.  CT CERVICAL SPINE WITHOUT CONTRAST  Technique:  Multidetector CT imaging of the cervical spine was performed without intravenous contrast.  Multiplanar CT image reconstructions were also generated.  Comparison:  01/09/2011  Findings:  Normal alignment without subluxation or dislocation. Facets aligned.  No compression fracture, wedge shaped deformity or focal kyphosis.  Diffuse degenerative changes of all levels, most pronounced from C2 to C6.  IMPRESSION: No acute osseous finding or fracture.  Stable degenerative changes and spondylosis.   Original Report Authenticated By: Judie Petit. Miles Costain, M.D.   Images viewed by me.  1. Fall at home, initial encounter   2. Contusion of occipital region of scalp, initial encounter       MDM  Fall one week ago with ongoing head pain. He'll be sent for CT scan.  CT is unremarkable. Patient is reassured of lack of findings of significant injury and is advised to continue taking over-the-counter analgesics as needed.  I personally performed the services described in this documentation, which was scribed in my presence. The recorded information has been reviewed and is accurate.        Victor Booze, MD 09/10/12 1622  Victor Booze,  MD 09/10/12 [phone]

## 2012-09-10 NOTE — ED Notes (Signed)
Pt states he was getting out of bed a week ago and fell backward. Complain of pain in the back of his head and left ear

## 2012-09-10 NOTE — ED Notes (Signed)
Pt states he want to get an IV while he is here due to his heavy ETOH  Abuse. States he keeps falling because of his drinking problem. Last drink was last night after midnight.

## 2012-09-10 NOTE — ED Notes (Signed)
Pt states he fell "at least a week ago" he fell backwards and hit his head. Pt states he had been drinking before he fell. Pt denies LOC. Pt is here requesting a CT of his head due to "feeling weird" since the fall.

## 2012-09-27 ENCOUNTER — Other Ambulatory Visit: Payer: Self-pay | Admitting: *Deleted

## 2012-09-27 MED ORDER — ALPRAZOLAM 1 MG PO TABS: 1.0000 mg | ORAL_TABLET | Freq: Three times a day (TID) | ORAL | Status: DC | PRN

## 2012-09-27 MED ORDER — ZOLPIDEM TARTRATE 10 MG PO TABS: 10.0000 mg | ORAL_TABLET | Freq: Every evening | ORAL | Status: DC | PRN

## 2012-10-09 ENCOUNTER — Ambulatory Visit: Payer: Medicare Other | Admitting: Family

## 2012-10-29 ENCOUNTER — Encounter: Payer: Medicare Other | Admitting: Internal Medicine

## 2012-11-22 ENCOUNTER — Other Ambulatory Visit: Payer: Self-pay | Admitting: Internal Medicine

## 2012-12-06 ENCOUNTER — Encounter: Payer: Self-pay | Admitting: Family

## 2012-12-06 ENCOUNTER — Ambulatory Visit (INDEPENDENT_AMBULATORY_CARE_PROVIDER_SITE_OTHER): Payer: Medicare Other | Admitting: Family

## 2012-12-06 VITALS — BP 98/68 | HR 85 | Wt 199.0 lb

## 2012-12-06 DIAGNOSIS — Z79899 Other long term (current) drug therapy: Secondary | ICD-10-CM

## 2012-12-06 DIAGNOSIS — F411 Generalized anxiety disorder: Secondary | ICD-10-CM

## 2012-12-06 DIAGNOSIS — K219 Gastro-esophageal reflux disease without esophagitis: Secondary | ICD-10-CM

## 2012-12-06 DIAGNOSIS — E7800 Pure hypercholesterolemia, unspecified: Secondary | ICD-10-CM

## 2012-12-06 LAB — LIPID PANEL
HDL: 60.7 mg/dL (ref >39.00)
Triglycerides: 201 mg/dL — ABNORMAL HIGH (ref 0.0–149.0)
VLDL: 40.2 mg/dL — ABNORMAL HIGH (ref 0.0–40.0)

## 2012-12-06 LAB — CBC WITH DIFFERENTIAL/PLATELET
Basophils Relative: 0.9 % (ref 0.0–3.0)
Eosinophils Relative: 5.7 % — ABNORMAL HIGH (ref 0.0–5.0)
HCT: 41 % (ref 39.0–52.0)
Hemoglobin: 14.2 g/dL (ref 13.0–17.0)
Lymphs Abs: 2.3 10*3/uL (ref 0.7–4.0)
MCV: 97.7 fl (ref 78.0–100.0)
Monocytes Relative: 12 % (ref 3.0–12.0)
Neutro Abs: 3.4 10*3/uL (ref 1.4–7.7)
WBC: 7 10*3/uL (ref 4.5–10.5)

## 2012-12-06 LAB — BASIC METABOLIC PANEL
BUN: 8 mg/dL (ref 6–23)
Calcium: 9.3 mg/dL (ref 8.4–10.5)
GFR: 90.07 mL/min (ref >60.00)
Glucose, Bld: 87 mg/dL (ref 70–99)
Sodium: 126 mEq/L — ABNORMAL LOW (ref 135–145)

## 2012-12-06 LAB — HEPATIC FUNCTION PANEL
Albumin: 4.2 g/dL (ref 3.5–5.2)
Total Bilirubin: 0.6 mg/dL (ref 0.3–1.2)

## 2012-12-06 MED ORDER — ALPRAZOLAM 1 MG PO TABS: 1.0000 mg | ORAL_TABLET | Freq: Two times a day (BID) | ORAL | Status: DC

## 2012-12-06 MED ORDER — PAROXETINE HCL 20 MG PO TABS: 20.0000 mg | ORAL_TABLET | ORAL | Status: DC

## 2012-12-06 MED ORDER — ZOLPIDEM TARTRATE 10 MG PO TABS: 10.0000 mg | ORAL_TABLET | Freq: Every evening | ORAL | Status: DC | PRN

## 2012-12-06 NOTE — Progress Notes (Signed)
Subjective:    Patient ID: BAZIL DHANANI, male    DOB: 01/06/1946, 67 y.o.   MRN: [id number]  HPI 67 year old white male, nonsmoker is in for recheck of hyperlipidemia, anxiety, GERD, and history of alcohol abuse. He is requesting a refill of Ambien and Xanax. He does continue taking Paxil. Reports his son said that it doesn't work. He lives in a home with his 2 sons, granddaughter and a great-grandchild. Reports feeling increased stress and anxiety. Patient also very frustrated about not being able to have his Ambien and Xanax refilled. Expressed that he could buy these medications off the street he wasn't so costly. He does not understand why we give him a hard time about filling these medications.  After reviewing his chart, the patient was seen in the emergency department on 09/10/2012 with a fall sustaining a laceration to his head after being intoxicated.  Review of Systems  Constitutional: Negative.   HENT: Negative.   Respiratory: Negative.   Cardiovascular: Negative.   Gastrointestinal: Negative.   Endocrine: Negative.   Genitourinary: Negative.   Musculoskeletal: Negative.   Neurological: Negative.   Hematological: Negative.   Psychiatric/Behavioral: Negative.    Past Medical History  Diagnosis Date  . Allergy   . Gout   . Hyperlipidemia   . EtOH dependence   . Hemothorax on right   . Lisfranc's dislocation 10/20/2011    History   Social History  . Marital Status: Widowed    Spouse Name: N/A    Number of Children: N/A  . Years of Education: N/A   Occupational History  . Not on file.   Social History Main Topics  . Smoking status: Former Smoker -- 1.00 packs/day for 10 years    Quit date: 08/09/1969  . Smokeless tobacco: Never Used  . Alcohol Use: Yes  . Drug Use: No  . Sexual Activity: Not on file   Other Topics Concern  . Not on file   Social History Narrative   Lives in [place],  Has two sons who live with him   He is self employed.   He does  not have a valid driver's license because of alcohol-related in fractions      Son drives him ;has MS          Past Surgical History  Procedure Laterality Date  . Appendectomy    . Hernia repair    . Tonsilectomy, adenoidectomy, bilateral myringotomy and tubes    . 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 Partial hip arthroplasty  2010    left    Family History  Problem Relation Age of Onset  . Arthritis Mother   . Tuberculosis Father     Allergies  Allergen Reactions  . Atorvastatin Anaphylaxis    facial swelling    Current Outpatient Prescriptions on File Prior to Visit  Medication Sig Dispense Refill  . ibuprofen (ADVIL,MOTRIN) 800 MG tablet       . naproxen (NAPROSYN) 375 MG tablet       . omeprazole (PRILOSEC) 20 MG capsule Take 20 mg by mouth daily as needed (heartburn). Heart burn      . simvastatin (ZOCOR) 40 MG tablet TAKE ONE TABLET BY MOUTH EVERY EVENING AT BEDTIME.  30 tablet  3  . traMADol (ULTRAM) 50 MG tablet       . ZYRTEC 10 MG tablet TAKE 1 TABLET BY MOUTH ONCE A DAY AS NEEDED.  90 tablet  3  . sildenafil (VIAGRA) 100 MG tablet  Take 1 tablet (100 mg total) by mouth every other day as needed for erectile dysfunction.  10 tablet  11   No current facility-administered medications on file prior to visit.    BP 98/68  Pulse 85  Wt 199 lb (90.266 kg)  BMI 32.13 kg/m2chart    Objective:   Physical Exam  Constitutional: He is oriented to person, place, and time. He appears well-developed and well-nourished.  HENT:  Right Ear: External ear normal.  Left Ear: External ear normal.  Nose: Nose normal.  Mouth/Throat: Oropharynx is clear and moist.  Neck: Normal range of motion. Neck supple.  Cardiovascular: Normal rate, regular rhythm and normal heart sounds.   Pulmonary/Chest: Effort normal and breath sounds normal.  Abdominal: Soft. Bowel sounds are normal.  Musculoskeletal: Normal range of motion.  Neurological: He is alert and oriented to  person, place, and time.  Skin: Skin is warm and dry.  Psychiatric: He has a normal mood and affect.  Seems anxious, slightly paranoid          Assessment & Plan:  Assessment: 1. Anxiety 2. Hyperlipidemia 3. GERD 4. History of alcohol abuse  Plan: Urine drug screen ordered. Also ordered BMP, LFTs, CBC and lipids Will notify patient of results. Continue current medications. I strongly advised him to decrease his use of Xanax to prevent dependence. I decreased his prescription from 3 times a day to twice daily. At Paxil 20 mg once a day. Recheck in 4 weeks. My hope is that we can continue to decrease Xanax and ultimately discontinued.

## 2012-12-06 NOTE — Patient Instructions (Addendum)

## 2012-12-07 LAB — DRUG SCREEN, URINE
Amphetamine Screen, Ur: NEGATIVE
Barbiturate Quant, Ur: NEGATIVE
Marijuana Metabolite: NEGATIVE
Methadone: NEGATIVE
Opiates: POSITIVE — AB

## 2012-12-25 IMAGING — CR DG CHEST 1V PORT
1 series · 1 of 1 positions shown · non-contrast
Comparison: 04/19/2010

CLINICAL DATA: Right-sided hemothorax.

PORTABLE CHEST - 1 VIEW

[AP]
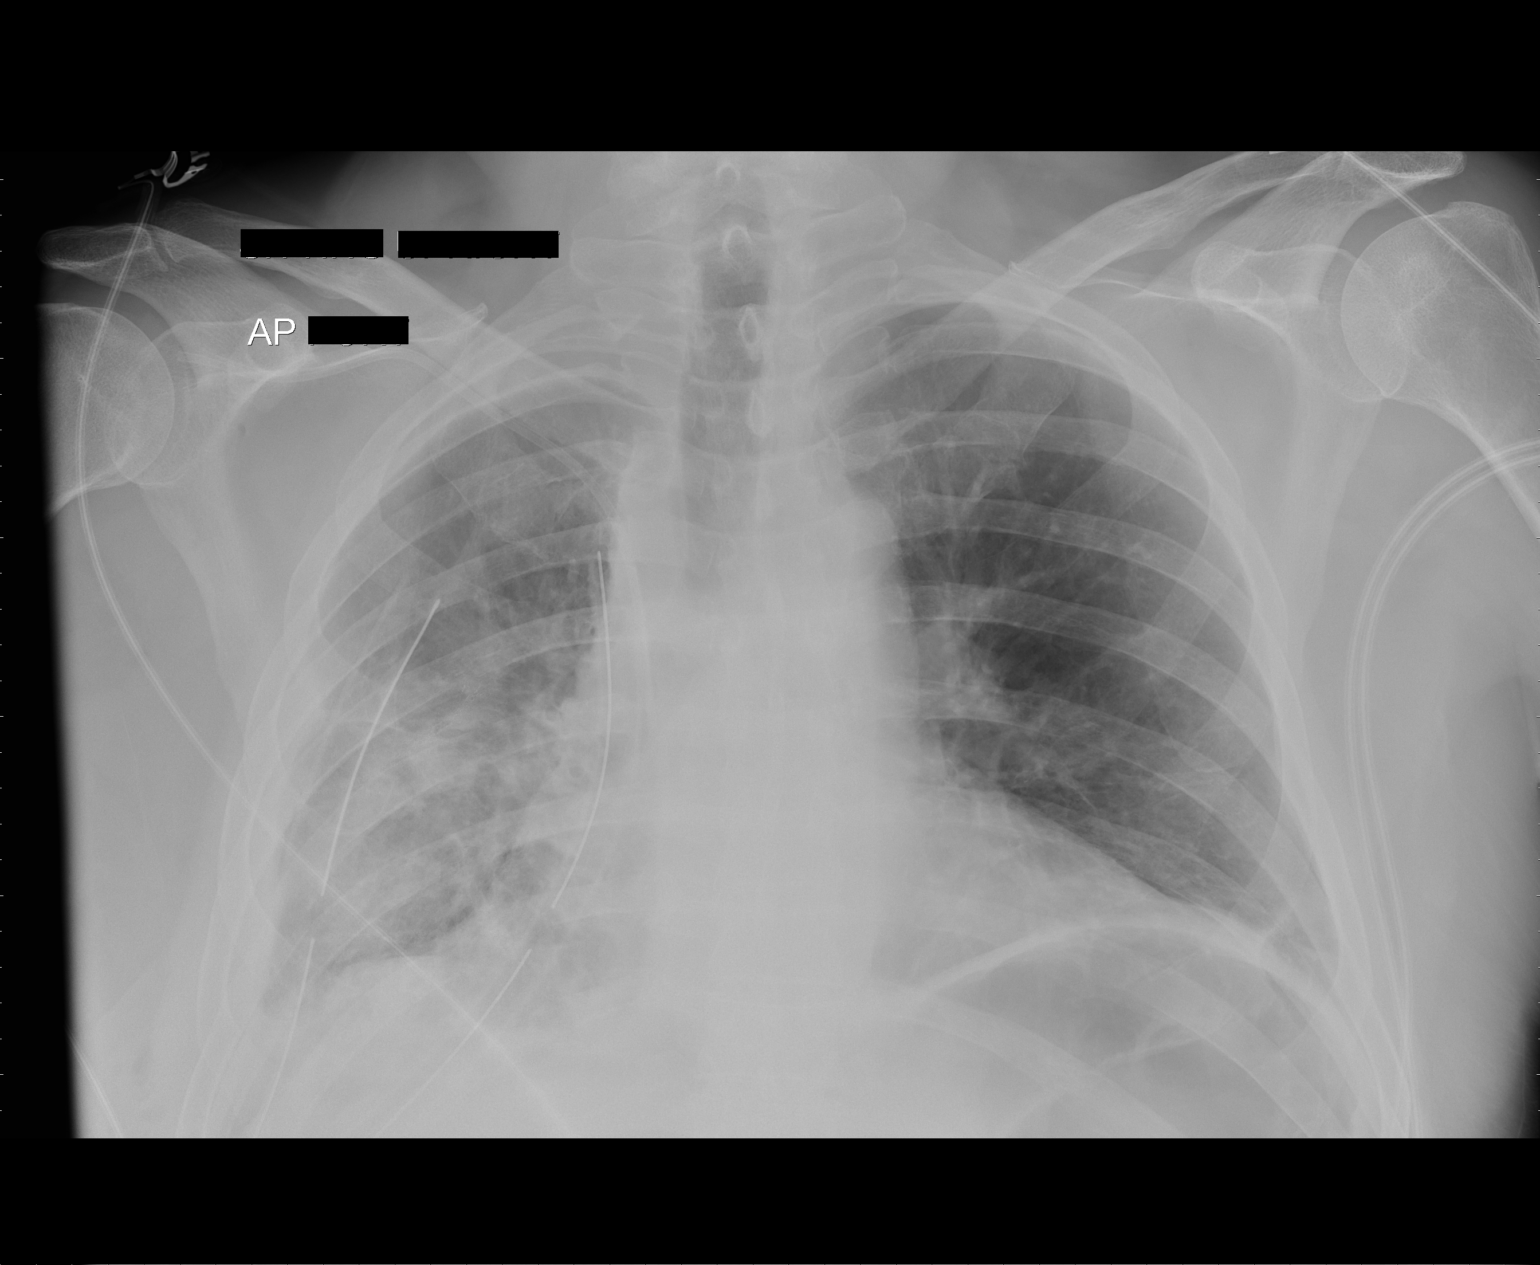

[1 of 1 positions shown; findings below may reference images not displayed]

FINDINGS: 6967 hours.  Lung volumes are low. The cardiopericardial
silhouette is enlarged.  Two right chest tubes remain in place
without evidence for pneumothorax.  Pleural thickening on the right
towards the base is stable.  The right subclavian central line tip
projects at the mid SVC level, unchanged.  Chronic interstitial
coarsening is seen in the left lung with persistent but decreased
atelectasis at the left base.
IMPRESSION: No substantial interval change exam.

## 2012-12-26 IMAGING — CR DG CHEST 1V PORT
1 series · 1 of 1 positions shown · non-contrast
Comparison: Chest radiograph 04/20/2010

CLINICAL DATA: Right hemothorax

PORTABLE CHEST - 1 VIEW

[AP]
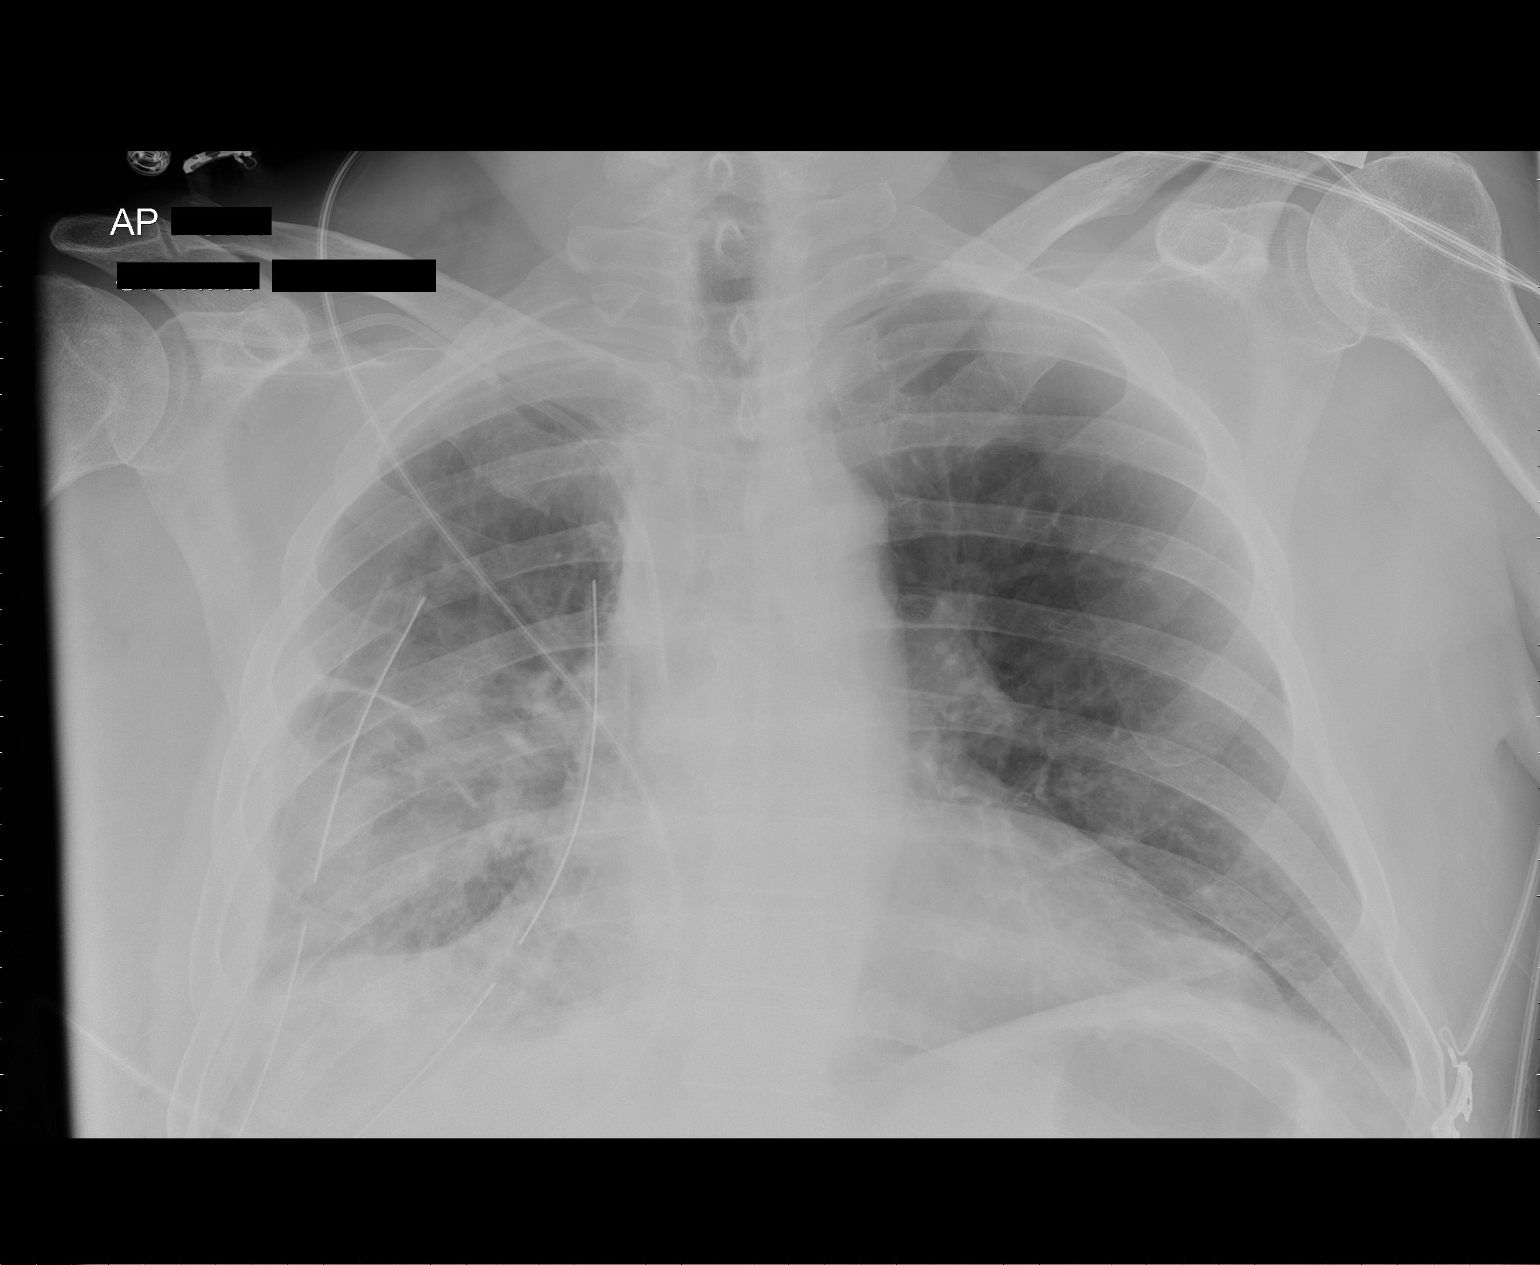

[1 of 1 positions shown; findings below may reference images not displayed]

FINDINGS: Two right chest tube in the right central venous line
unchanged.  No evidence of right pneumothorax.  There is volume
loss in the right hemithorax and fluid at the right lung base and
along the right lateral chest wall consistent recent surgery.  The
left lung is clear without evidence pneumothorax or edema.
IMPRESSION: Stable postoperative change in the right hemithorax with two chest
tubes in place and no pneumothorax.

## 2012-12-28 IMAGING — CR DG CHEST 1V PORT
1 series · 1 of 1 positions shown · non-contrast
Comparison: Chest x-ray 04/22/2010.

CLINICAL DATA: Follow-up hemothorax.

PORTABLE CHEST - 1 VIEW

[view not recorded]
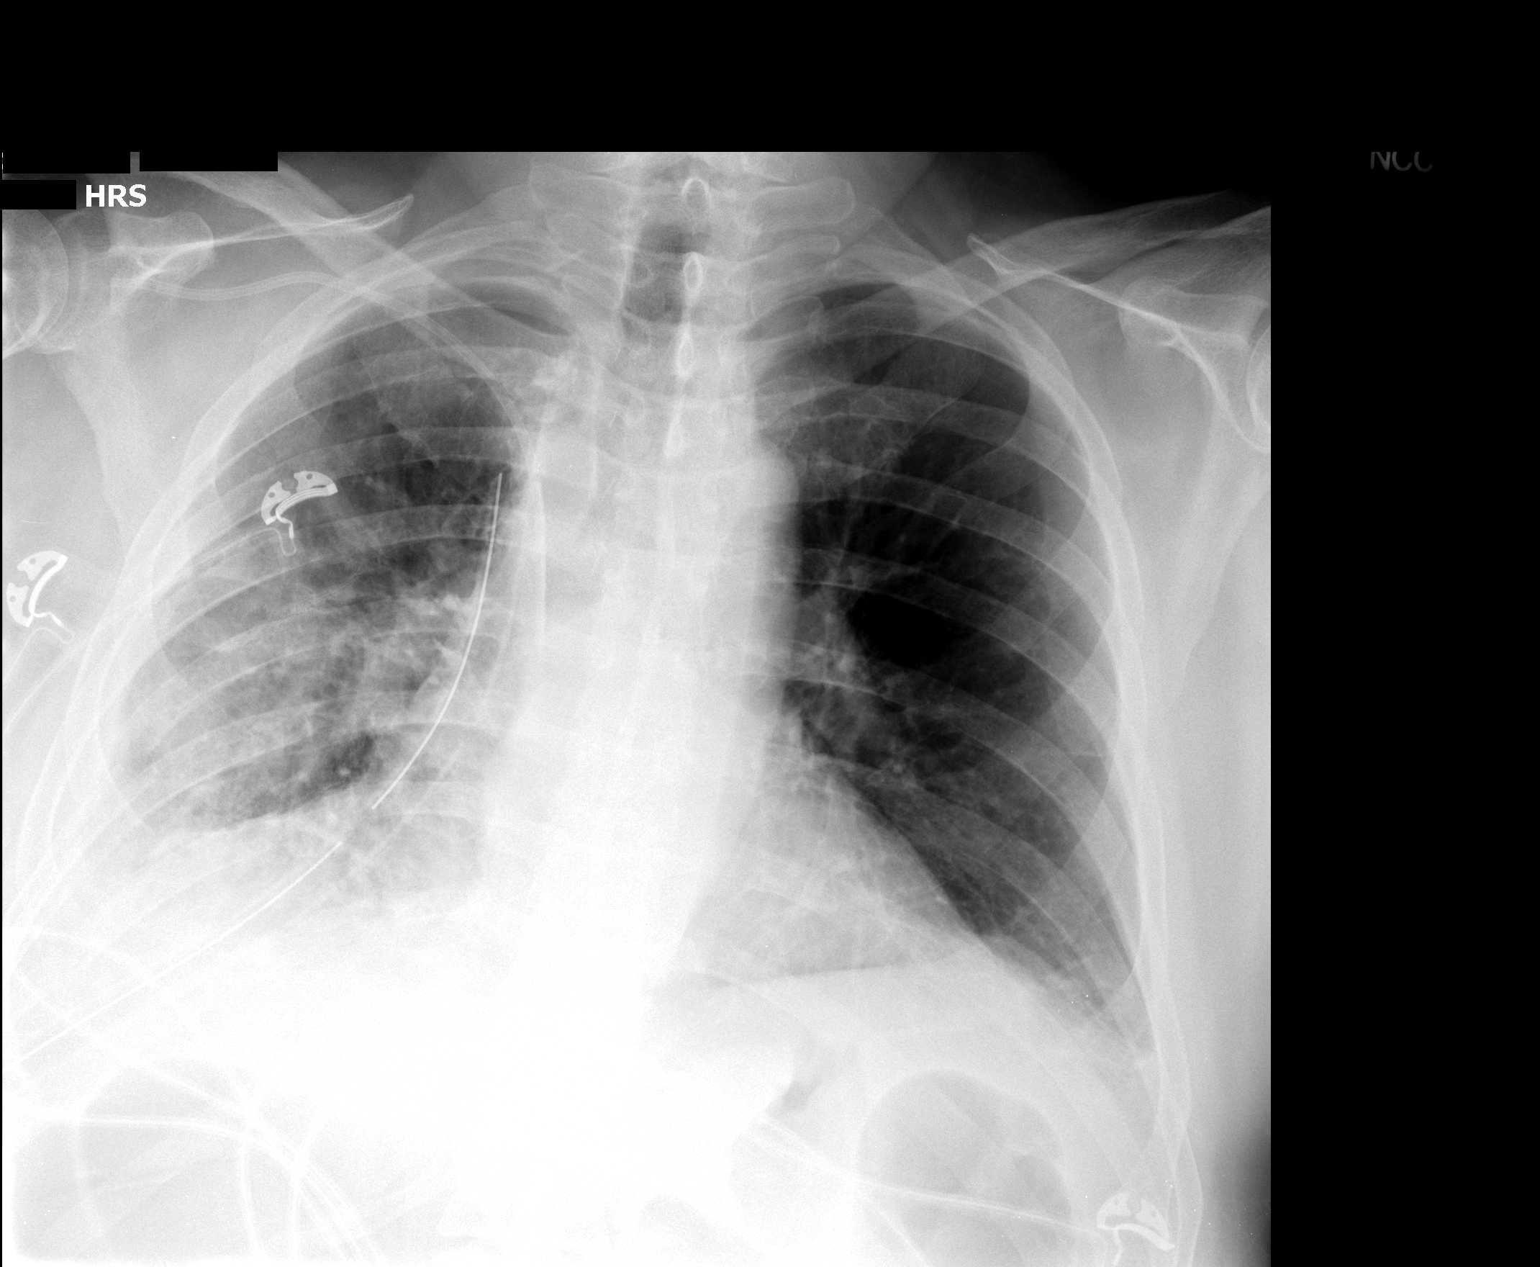

[1 of 1 positions shown; findings below may reference images not displayed]

FINDINGS: The more lateral right-sided chest tube has been removed.
The medial chest tube is stable.  No pneumothorax.  Improved right
lung aeration.  Stable right subclavian central venous catheter.
The left lung remains clear except for streaky basilar atelectasis.
IMPRESSION: 1.  Removal of one of the right-sided chest tubes without
pneumothorax.
2.  Improved lung aeration.

## 2012-12-29 IMAGING — CR DG CHEST 2V
2 series · 2 of 2 positions shown · non-contrast
Comparison: 04/23/2010

CLINICAL DATA: Right hemothorax

CHEST - 2 VIEW

[w chest pa]
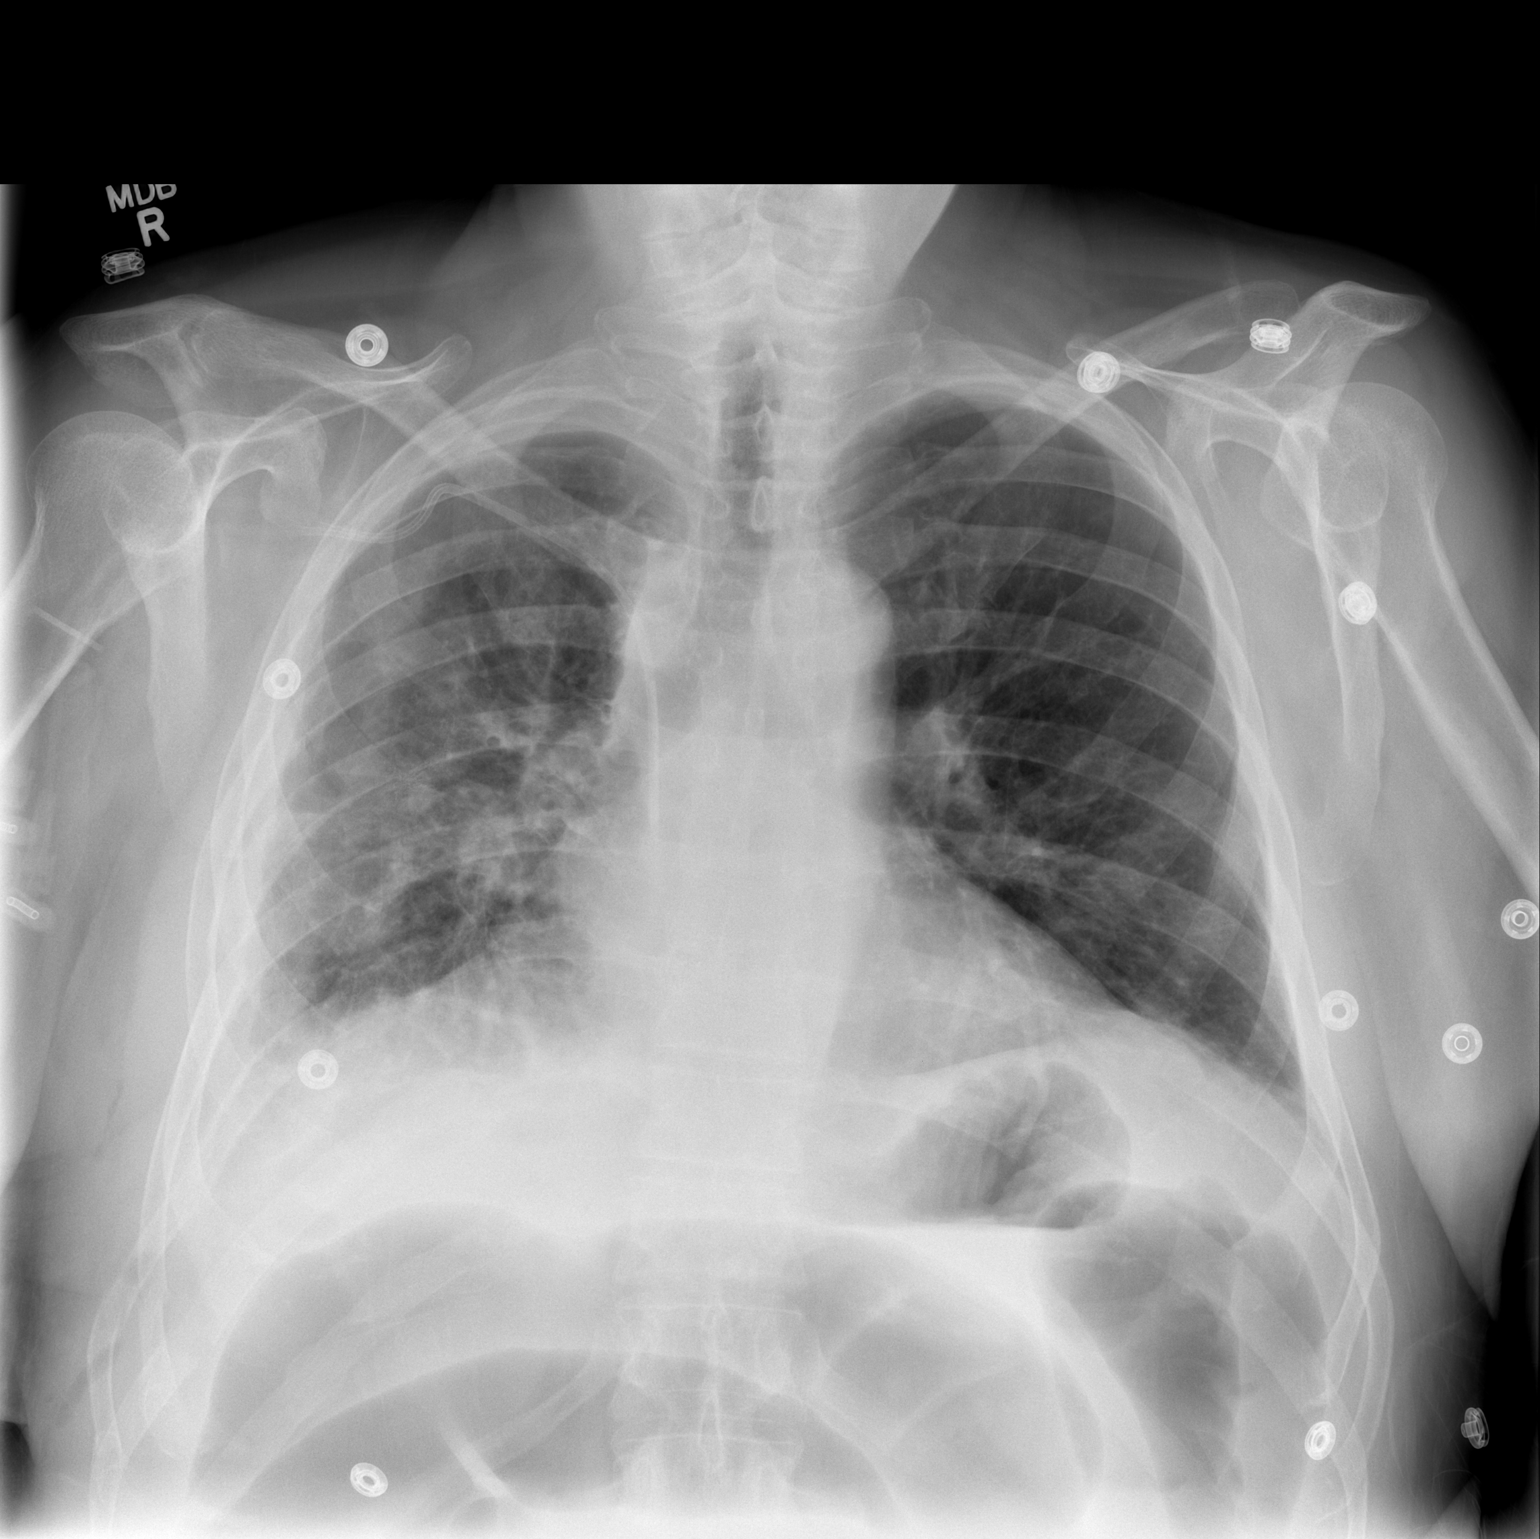

[w chest lat]
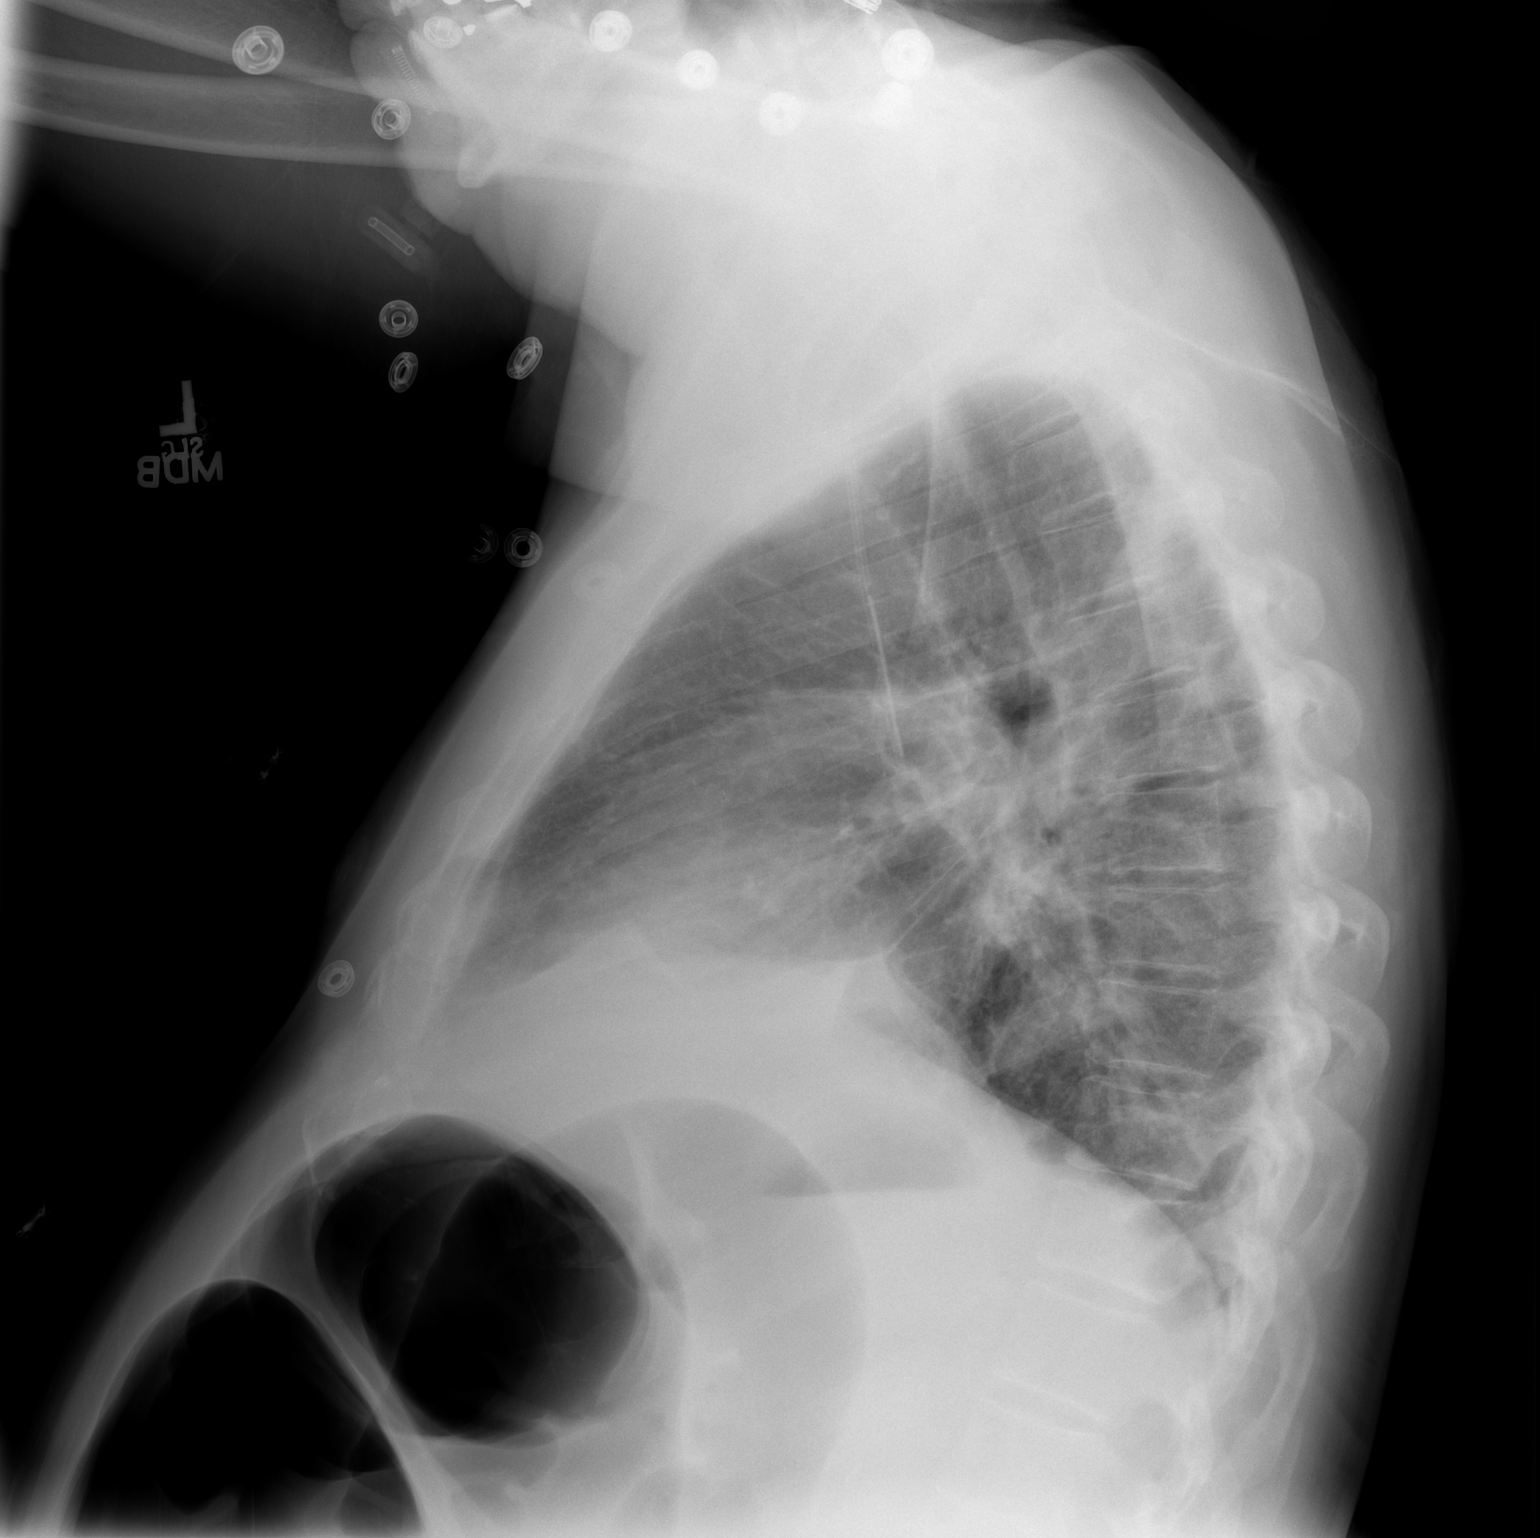

[2 of 2 positions shown; findings below may reference images not displayed]

FINDINGS: There is a right subclavian central venous catheter with
tip in the cavoatrial junction.

The right-sided chest tube has been removed.  No pneumothorax
identified.  Right pleural effusion/thickening and right midlung
and right base airspace opacities are unchanged from the previous
exam.

Left lung is clear.

There is marked gaseous distention of the bowel loops noted below
the hemidiaphragms.
IMPRESSION: 1.  Interval removal of right chest tube.
2.  No pneumothorax noted.
3.  No change in aeration to the right lung base.

## 2013-01-01 ENCOUNTER — Other Ambulatory Visit: Payer: Self-pay | Admitting: *Deleted

## 2013-01-01 MED ORDER — OMEPRAZOLE 20 MG PO CPDR: 20.0000 mg | DELAYED_RELEASE_CAPSULE | Freq: Every day | ORAL | Status: DC | PRN

## 2013-01-08 ENCOUNTER — Other Ambulatory Visit: Payer: Self-pay | Admitting: *Deleted

## 2013-01-08 MED ORDER — CETIRIZINE HCL 10 MG PO TABS: 10.0000 mg | ORAL_TABLET | Freq: Every day | ORAL | Status: DC

## 2013-02-28 ENCOUNTER — Telehealth: Payer: Self-pay | Admitting: Internal Medicine

## 2013-02-28 NOTE — Telephone Encounter (Addendum)
Pt following up on request from pharm for a refill on ALPRAZolam Prudy Feeler) 1 MG tablet and zolpidem (AMBIEN) 10 MG tablet Last filled 12/06/12 w/ 2 refills  [employer]

## 2013-02-28 NOTE — Telephone Encounter (Signed)
Is this ok to refill?  

## 2013-03-03 NOTE — Telephone Encounter (Signed)
Pt has cpe on 03/17/13

## 2013-03-03 NOTE — Telephone Encounter (Signed)
It's been quite some time since he has been seen Schedule OV- ok to see padonda

## 2013-03-04 NOTE — Telephone Encounter (Signed)
Pt would like to know if you will fill the meds before his appt? ambien and xanax.? Does he need to see padonda before 12/1 scheduled cpe?

## 2013-03-04 NOTE — Telephone Encounter (Signed)
Please advise Dr Cato Mulligan

## 2013-03-17 ENCOUNTER — Encounter: Payer: Self-pay | Admitting: Internal Medicine

## 2013-03-17 ENCOUNTER — Encounter: Payer: Medicare Other | Admitting: Internal Medicine

## 2013-03-17 DIAGNOSIS — F4323 Adjustment disorder with mixed anxiety and depressed mood: Secondary | ICD-10-CM | POA: Insufficient documentation

## 2013-03-17 NOTE — Assessment & Plan Note (Signed)
This is likely related to previous trauma. Reviewed laboratories from August 2014. This problem has resolved. I will remove it from the problem list.

## 2013-03-17 NOTE — Progress Notes (Signed)
This encounter was created in error - please disregard.

## 2013-03-17 NOTE — Assessment & Plan Note (Signed)
Reviewed labs from August 2014. This problem has resolved. I will remove it from the problem list.

## 2013-03-17 NOTE — Assessment & Plan Note (Signed)
Discussed need for complete alcohol abstinence.

## 2013-03-17 NOTE — Assessment & Plan Note (Signed)
Unable to tolerate the atorvastatin but is tolerating simvastatin without difficulty. Recent lipid panel is within normal limits.

## 2013-03-18 ENCOUNTER — Encounter: Payer: Self-pay | Admitting: *Deleted

## 2013-03-19 ENCOUNTER — Ambulatory Visit: Payer: Medicare Other | Admitting: Family

## 2013-03-24 ENCOUNTER — Other Ambulatory Visit: Payer: Self-pay | Admitting: Internal Medicine

## 2013-03-25 ENCOUNTER — Ambulatory Visit (INDEPENDENT_AMBULATORY_CARE_PROVIDER_SITE_OTHER): Payer: Medicare Other | Admitting: Family

## 2013-03-25 ENCOUNTER — Encounter: Payer: Self-pay | Admitting: Family

## 2013-03-25 VITALS — BP 96/60 | HR 85 | Wt 204.0 lb

## 2013-03-25 DIAGNOSIS — K219 Gastro-esophageal reflux disease without esophagitis: Secondary | ICD-10-CM

## 2013-03-25 DIAGNOSIS — G4700 Insomnia, unspecified: Secondary | ICD-10-CM

## 2013-03-25 DIAGNOSIS — F411 Generalized anxiety disorder: Secondary | ICD-10-CM

## 2013-03-25 DIAGNOSIS — E7800 Pure hypercholesterolemia, unspecified: Secondary | ICD-10-CM

## 2013-03-25 DIAGNOSIS — R0989 Other specified symptoms and signs involving the circulatory and respiratory systems: Secondary | ICD-10-CM

## 2013-03-25 LAB — CBC WITH DIFFERENTIAL/PLATELET
Basophils Absolute: 0 10*3/uL (ref 0.0–0.1)
Basophils Relative: 0.7 % (ref 0.0–3.0)
Eosinophils Absolute: 0.2 10*3/uL (ref 0.0–0.7)
HCT: 41.5 % (ref 39.0–52.0)
Hemoglobin: 14.4 g/dL (ref 13.0–17.0)
Lymphs Abs: 2.2 10*3/uL (ref 0.7–4.0)
MCHC: 34.8 g/dL (ref 30.0–36.0)
MCV: 97 fl (ref 78.0–100.0)
Monocytes Absolute: 0.7 10*3/uL (ref 0.1–1.0)
Neutro Abs: 3.1 10*3/uL (ref 1.4–7.7)
RBC: 4.28 Mil/uL (ref 4.22–5.81)
RDW: 12.9 % (ref 11.5–14.6)

## 2013-03-25 LAB — HEPATIC FUNCTION PANEL
Albumin: 4.4 g/dL (ref 3.5–5.2)
Alkaline Phosphatase: 36 U/L — ABNORMAL LOW (ref 39–117)
Bilirubin, Direct: 0.1 mg/dL (ref 0.0–0.3)
Total Bilirubin: 0.6 mg/dL (ref 0.3–1.2)

## 2013-03-25 LAB — BASIC METABOLIC PANEL WITH GFR
BUN: 8 mg/dL (ref 6–23)
CO2: 23 meq/L (ref 19–32)
Calcium: 8.8 mg/dL (ref 8.4–10.5)
Chloride: 90 meq/L — ABNORMAL LOW (ref 96–112)
Creatinine, Ser: 1 mg/dL (ref 0.4–1.5)
GFR: 76.03 mL/min
Glucose, Bld: 103 mg/dL — ABNORMAL HIGH (ref 70–99)
Potassium: 4.5 meq/L (ref 3.5–5.1)
Sodium: 123 meq/L — ABNORMAL LOW (ref 135–145)

## 2013-03-25 MED ORDER — ZOLPIDEM TARTRATE 10 MG PO TABS: 10.0000 mg | ORAL_TABLET | Freq: Every evening | ORAL | Status: DC | PRN

## 2013-03-25 MED ORDER — ALPRAZOLAM 1 MG PO TABS: 1.0000 mg | ORAL_TABLET | Freq: Two times a day (BID) | ORAL | Status: DC

## 2013-03-26 ENCOUNTER — Other Ambulatory Visit: Payer: Self-pay | Admitting: Family

## 2013-03-26 NOTE — Progress Notes (Signed)
Subjective:    Patient ID: Victor Diaz, male    DOB: 04-Dec-1945, 67 y.o.   MRN: [id number]  HPI 67 year old WM, smoker, patient of Dr. Cato Mulligan is in today for a recheck of anxiety, insomnia, hypercholesterolemia, GERD, Gout, and Aortic Stenosis. He has been out of his medication x 1 week. Follows-up with cardiology as needed. No concerns today.    Review of Systems  Constitutional: Negative.   HENT: Negative.   Respiratory: Negative.   Cardiovascular: Negative.   Gastrointestinal: Negative.   Endocrine: Negative.   Genitourinary: Negative.   Musculoskeletal: Negative.   Skin: Negative.   Neurological: Negative.   Hematological: Negative.   Psychiatric/Behavioral: Negative.    Past Medical History  Diagnosis Date  . Allergy   . Gout   . Hyperlipidemia   . EtOH dependence   . Hemothorax on right   . Lisfranc's dislocation 10/20/2011  . Right fibular fracture 10/20/2011    History   Social History  . Marital Status: Widowed    Spouse Name: N/A    Number of Children: N/A  . Years of Education: N/A   Occupational History  . Not on file.   Social History Main Topics  . Smoking status: Former Smoker -- 1.00 packs/day for 10 years    Quit date: 08/09/1969  . Smokeless tobacco: Never Used  . Alcohol Use: Yes  . Drug Use: No  . Sexual Activity: Not on file   Other Topics Concern  . Not on file   Social History Narrative   Lives in [place],  Has two sons who live with him   He is self employed.   He does not have a valid driver's license because of alcohol-related in fractions      Son drives him ;has MS          Past Surgical History  Procedure Laterality Date  . Appendectomy    . Hernia repair    . Tonsilectomy, adenoidectomy, bilateral myringotomy and tubes    . 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 Partial hip arthroplasty  2010    left    Family History  Problem Relation Age of Onset  . Arthritis Mother   . Tuberculosis Father      Allergies  Allergen Reactions  . Atorvastatin Anaphylaxis    facial swelling    Current Outpatient Prescriptions on File Prior to Visit  Medication Sig Dispense Refill  . cetirizine (ZYRTEC) 10 MG tablet Take 1 tablet (10 mg total) by mouth daily.  90 tablet  1  . ibuprofen (ADVIL,MOTRIN) 800 MG tablet       . naproxen (NAPROSYN) 375 MG tablet       . omeprazole (PRILOSEC) 20 MG capsule TAKE 1 CAPSULE BY MOUTH DAILY AS NEEDED FOR HEART BURN.  30 capsule  5  . PARoxetine (PAXIL) 20 MG tablet Take 1 tablet (20 mg total) by mouth every morning.  30 tablet  3  . simvastatin (ZOCOR) 40 MG tablet TAKE ONE TABLET BY MOUTH EVERY EVENING AT BEDTIME.  30 tablet  3  . traMADol (ULTRAM) 50 MG tablet       . sildenafil (VIAGRA) 100 MG tablet Take 1 tablet (100 mg total) by mouth every other day as needed for erectile dysfunction.  10 tablet  11   No current facility-administered medications on file prior to visit.    BP 96/60  Pulse 85  Wt 204 lb (92.534 kg)chart    Objective:  Physical Exam  Constitutional: He is oriented to person, place, and time. He appears well-developed and well-nourished.  HENT:  Right Ear: External ear normal.  Left Ear: External ear normal.  Nose: Nose normal.  Mouth/Throat: Oropharynx is clear and moist.  Neck: Normal range of motion. Neck supple.  Cardiovascular: Normal rate, regular rhythm and normal heart sounds.   Pulmonary/Chest: Effort normal and breath sounds normal.  Musculoskeletal: Normal range of motion.  Neurological: He is alert and oriented to person, place, and time.  Skin: Skin is warm and dry.  Psychiatric: He has a normal mood and affect.          Assessment & Plan:  Assessment: 1. Anxiety 2. Insomnia 3. Hypercholesterolemia 4. GERD 5. Gout 6. Carotid Bruit  Plan: Labs sent today. Meds refilled. Carotid doppler study ordered. Recheck in 4 weeks and sooner as needed

## 2013-03-26 NOTE — Progress Notes (Signed)
Pt is sch for tomorrow at 10am

## 2013-03-27 ENCOUNTER — Encounter: Payer: Self-pay | Admitting: Family

## 2013-03-27 ENCOUNTER — Ambulatory Visit (INDEPENDENT_AMBULATORY_CARE_PROVIDER_SITE_OTHER): Payer: Medicare Other | Admitting: Family

## 2013-03-27 ENCOUNTER — Ambulatory Visit: Payer: Medicare Other

## 2013-03-27 VITALS — BP 102/64 | HR 97 | Wt 204.0 lb

## 2013-03-27 DIAGNOSIS — E871 Hypo-osmolality and hyponatremia: Secondary | ICD-10-CM

## 2013-03-27 DIAGNOSIS — F1010 Alcohol abuse, uncomplicated: Secondary | ICD-10-CM

## 2013-03-27 MED ORDER — TRAMADOL HCL 50 MG PO TABS: 50.0000 mg | ORAL_TABLET | Freq: Two times a day (BID) | ORAL | Status: DC

## 2013-03-27 NOTE — Progress Notes (Signed)
Pre visit review using our clinic review tool, if applicable. No additional management support is needed unless otherwise documented below in the visit note. 

## 2013-03-27 NOTE — Progress Notes (Signed)
Subjective:    Patient ID: Victor Diaz, male    DOB: 01/10/1946, 67 y.o.   MRN: [id number]  HPI 67 year old white male, with a history of alcohol dependence is in today for IV fluids to assist with hyponatremia.   Review of Systems  Constitutional: Negative.   HENT: Negative.   Respiratory: Negative.   Cardiovascular: Negative.   Gastrointestinal: Negative.   Endocrine: Negative.   Musculoskeletal: Negative.   Neurological: Negative.   Hematological: Negative.   Psychiatric/Behavioral: Negative.    Past Medical History  Diagnosis Date  . Allergy   . Gout   . Hyperlipidemia   . EtOH dependence   . Hemothorax on right   . Lisfranc's dislocation 10/20/2011  . Right fibular fracture 10/20/2011    History   Social History  . Marital Status: Widowed    Spouse Name: N/A    Number of Children: N/A  . Years of Education: N/A   Occupational History  . Not on file.   Social History Main Topics  . Smoking status: Former Smoker -- 1.00 packs/day for 10 years    Quit date: 08/09/1969  . Smokeless tobacco: Never Used  . Alcohol Use: Yes  . Drug Use: No  . Sexual Activity: Not on file   Other Topics Concern  . Not on file   Social History Narrative   Lives in [place],  Has two sons who live with him   He is self employed.   He does not have a valid driver's license because of alcohol-related in fractions      Son drives him ;has MS          Past Surgical History  Procedure Laterality Date  . Appendectomy    . Hernia repair    . Tonsilectomy, adenoidectomy, bilateral myringotomy and tubes    . 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 Partial hip arthroplasty  2010    left    Family History  Problem Relation Age of Onset  . Arthritis Mother   . Tuberculosis Father     Allergies  Allergen Reactions  . Atorvastatin Anaphylaxis    facial swelling    Current Outpatient Prescriptions on File Prior to Visit  Medication Sig Dispense Refill  .  ALPRAZolam (XANAX) 1 MG tablet Take 1 tablet (1 mg total) by mouth 2 (two) times daily. For anxiety  60 tablet  2  . cetirizine (ZYRTEC) 10 MG tablet Take 1 tablet (10 mg total) by mouth daily.  90 tablet  1  . ibuprofen (ADVIL,MOTRIN) 800 MG tablet       . naproxen (NAPROSYN) 375 MG tablet       . omeprazole (PRILOSEC) 20 MG capsule TAKE 1 CAPSULE BY MOUTH DAILY AS NEEDED FOR HEART BURN.  30 capsule  5  . PARoxetine (PAXIL) 20 MG tablet Take 1 tablet (20 mg total) by mouth every morning.  30 tablet  3  . simvastatin (ZOCOR) 40 MG tablet TAKE ONE TABLET BY MOUTH EVERY EVENING AT BEDTIME.  30 tablet  3  . zolpidem (AMBIEN) 10 MG tablet Take 1 tablet (10 mg total) by mouth at bedtime as needed for sleep.  30 tablet  2  . sildenafil (VIAGRA) 100 MG tablet Take 1 tablet (100 mg total) by mouth every other day as needed for erectile dysfunction.  10 tablet  11   No current facility-administered medications on file prior to visit.    BP 102/64  Pulse 97  Wt 204  lb (92.534 kg)chart    Objective:   Physical Exam  Constitutional: He is oriented to person, place, and time. He appears well-developed and well-nourished.  Neck: Normal range of motion. Neck supple.  Cardiovascular: Normal rate, regular rhythm and normal heart sounds.   Pulmonary/Chest: Effort normal and breath sounds normal.  Abdominal: Soft. Bowel sounds are normal.  Neurological: He is alert and oriented to person, place, and time.  Skin: Skin is warm and dry.  Psychiatric: He has a normal mood and affect.     1 L normal saline given 22-gauge peripheral IV in the right arm     Assessment & Plan:   assessment: 1. Hyponatremia  Plan: Recheck in 2 weeks for complete physical exam or recheck electrolytes. Decrease alcohol consumption. We'll follow the patient in 2 weeks for fasting labs and sooner as needed.

## 2013-03-27 NOTE — Patient Instructions (Signed)
Hyponatremia   Hyponatremia is when the amount of salt (sodium) in your blood is too low. When sodium levels are low, your cells will absorb extra water and swell. The swelling happens throughout the body, but it mostly affects the brain. Severe brain swelling (cerebral edema), seizures, or coma can happen.   CAUSES    Heart, kidney, or liver problems.   Thyroid problems.   Adrenal gland problems.   Severe vomiting and diarrhea.   Certain medicines or illegal drugs.   Dehydration.   Drinking too much water.   Low-sodium diet.  SYMPTOMS    Nausea and vomiting.   Confusion.   Lethargy.   Agitation.   Headache.   Twitching or shaking (seizures).   Unconsciousness.   Appetite loss.   Muscle weakness and cramping.  DIAGNOSIS   Hyponatremia is identified by a simple blood test. Your caregiver will perform a history and physical exam to try to find the cause and type of hyponatremia. Other tests may be needed to measure the amount of sodium in your blood and urine.  TREATMENT   Treatment will depend on the cause.    Fluids may be given through the vein (IV).   Medicines may be used to correct the sodium imbalance. If medicines are causing the problem, they will need to be adjusted.   Water or fluid intake may be restricted to restore proper balance.  The speed of correcting the sodium problem is very important. If the problem is corrected too fast, nerve damage (sometimes unchangeable) can happen.  HOME CARE INSTRUCTIONS    Only take medicines as directed by your caregiver. Many medicines can make hyponatremia worse. Discuss all your medicines with your caregiver.   Carefully follow any recommended diet, including any fluid restrictions.   You may be asked to repeat lab tests. Follow these directions.   Avoid alcohol and recreational drugs.  SEEK MEDICAL CARE IF:    You develop worsening nausea, fatigue, headache, confusion, or weakness.   Your original hyponatremia symptoms return.   You have  problems following the recommended diet.  SEEK IMMEDIATE MEDICAL CARE IF:    You have a seizure.   You faint.   You have ongoing diarrhea or vomiting.  MAKE SURE YOU:    Understand these instructions.   Will watch your condition.   Will get help right away if you are not doing well or get worse.  Document Released: 03/24/2002 Document Revised: 06/26/2011 Document Reviewed: 09/18/2010  [employer] Patient Information 2014 [employer]

## 2013-04-01 ENCOUNTER — Ambulatory Visit (HOSPITAL_COMMUNITY): Payer: Medicare Other | Attending: Internal Medicine

## 2013-04-01 DIAGNOSIS — R0989 Other specified symptoms and signs involving the circulatory and respiratory systems: Secondary | ICD-10-CM | POA: Insufficient documentation

## 2013-04-01 DIAGNOSIS — I6529 Occlusion and stenosis of unspecified carotid artery: Secondary | ICD-10-CM

## 2013-04-07 ENCOUNTER — Telehealth: Payer: Self-pay | Admitting: Internal Medicine

## 2013-04-07 DIAGNOSIS — F1010 Alcohol abuse, uncomplicated: Secondary | ICD-10-CM

## 2013-04-07 NOTE — Telephone Encounter (Signed)
Pt would like to know if you have results of ultra sound back yet> Would also like to go over his last labs. pls call

## 2013-04-07 NOTE — Telephone Encounter (Signed)
Pt aware and verbalized understanding. [place] of liver ordered

## 2013-04-09 ENCOUNTER — Other Ambulatory Visit: Payer: Self-pay

## 2013-04-09 DIAGNOSIS — F1010 Alcohol abuse, uncomplicated: Secondary | ICD-10-CM

## 2013-04-09 DIAGNOSIS — R1011 Right upper quadrant pain: Secondary | ICD-10-CM

## 2013-04-29 ENCOUNTER — Telehealth: Payer: Self-pay | Admitting: Internal Medicine

## 2013-04-29 MED ORDER — SIMVASTATIN 40 MG PO TABS: ORAL_TABLET | ORAL | Status: DC

## 2013-04-29 NOTE — Telephone Encounter (Signed)
rx sent in electronically 

## 2013-04-29 NOTE — Telephone Encounter (Signed)
[facility] requesting refill of simvastatin (ZOCOR) 40 MG tablet, last filled 03/05/13

## 2013-04-30 ENCOUNTER — Encounter: Payer: Medicare Other | Admitting: Family

## 2013-05-08 ENCOUNTER — Other Ambulatory Visit: Payer: Medicare Other

## 2013-05-14 ENCOUNTER — Other Ambulatory Visit: Payer: Medicare Other

## 2013-06-19 ENCOUNTER — Telehealth: Payer: Self-pay | Admitting: Internal Medicine

## 2013-06-19 ENCOUNTER — Telehealth: Payer: Self-pay | Admitting: Family

## 2013-06-19 NOTE — Telephone Encounter (Signed)
[facility] requesting refills of the following:  ALPRAZolam (XANAX) 1 MG tablet zolpidem (AMBIEN) 10 MG tablet

## 2013-06-19 NOTE — Telephone Encounter (Signed)
Pt is needing new rx for ALPRAZolam Duanne Moron) 1 MG tablet and ambien 10 mg sent to [facility]

## 2013-06-20 NOTE — Telephone Encounter (Signed)
Duplicate

## 2013-06-20 NOTE — Telephone Encounter (Signed)
Please advise if ok for refill?

## 2013-06-21 NOTE — Telephone Encounter (Signed)
He really should not take either given his other medical problems.   For now ok to refill the xanax. He should stop [place]

## 2013-06-24 MED ORDER — ALPRAZOLAM 1 MG PO TABS: 1.0000 mg | ORAL_TABLET | Freq: Two times a day (BID) | ORAL | Status: DC

## 2013-06-24 NOTE — Telephone Encounter (Signed)
Xanax faxed to [facility], pt aware

## 2013-07-05 ENCOUNTER — Other Ambulatory Visit: Payer: Self-pay | Admitting: Internal Medicine

## 2013-08-19 ENCOUNTER — Emergency Department (HOSPITAL_COMMUNITY)
Admission: EM | Admit: 2013-08-19 | Discharge: 2013-08-19 | Disposition: A | Discharge status: Home / Self Care | Payer: Medicare Other | Attending: Emergency Medicine | Admitting: Emergency Medicine

## 2013-08-19 ENCOUNTER — Encounter (HOSPITAL_COMMUNITY): Payer: Self-pay | Admitting: Emergency Medicine

## 2013-08-19 DIAGNOSIS — Y9389 Activity, other specified: Secondary | ICD-10-CM | POA: Insufficient documentation

## 2013-08-19 DIAGNOSIS — F10939 Alcohol use, unspecified with withdrawal, unspecified: Secondary | ICD-10-CM | POA: Insufficient documentation

## 2013-08-19 DIAGNOSIS — Y929 Unspecified place or not applicable: Secondary | ICD-10-CM | POA: Insufficient documentation

## 2013-08-19 DIAGNOSIS — E869 Volume depletion, unspecified: Secondary | ICD-10-CM

## 2013-08-19 DIAGNOSIS — M109 Gout, unspecified: Secondary | ICD-10-CM | POA: Insufficient documentation

## 2013-08-19 DIAGNOSIS — F10239 Alcohol dependence with withdrawal, unspecified: Secondary | ICD-10-CM

## 2013-08-19 DIAGNOSIS — Z791 Long term (current) use of non-steroidal anti-inflammatories (NSAID): Secondary | ICD-10-CM | POA: Insufficient documentation

## 2013-08-19 DIAGNOSIS — Z8709 Personal history of other diseases of the respiratory system: Secondary | ICD-10-CM | POA: Insufficient documentation

## 2013-08-19 DIAGNOSIS — S0191XA Laceration without foreign body of unspecified part of head, initial encounter: Secondary | ICD-10-CM

## 2013-08-19 DIAGNOSIS — S0100XA Unspecified open wound of scalp, initial encounter: Secondary | ICD-10-CM | POA: Insufficient documentation

## 2013-08-19 DIAGNOSIS — IMO0002 Reserved for concepts with insufficient information to code with codable children: Secondary | ICD-10-CM | POA: Insufficient documentation

## 2013-08-19 DIAGNOSIS — E785 Hyperlipidemia, unspecified: Secondary | ICD-10-CM | POA: Insufficient documentation

## 2013-08-19 DIAGNOSIS — Z8781 Personal history of (healed) traumatic fracture: Secondary | ICD-10-CM | POA: Insufficient documentation

## 2013-08-19 DIAGNOSIS — Z79899 Other long term (current) drug therapy: Secondary | ICD-10-CM | POA: Insufficient documentation

## 2013-08-19 DIAGNOSIS — Z87891 Personal history of nicotine dependence: Secondary | ICD-10-CM | POA: Insufficient documentation

## 2013-08-19 MED ORDER — LORAZEPAM 2 MG/ML IJ SOLN: 0.0000 mg | Freq: Four times a day (QID) | INTRAMUSCULAR | Status: DC

## 2013-08-19 MED ORDER — LORAZEPAM 1 MG PO TABS
0.0000 mg | ORAL_TABLET | Freq: Two times a day (BID) | ORAL | Status: DC
  Filled 2013-08-19: qty 1

## 2013-08-19 MED ORDER — VITAMIN B-1 100 MG PO TABS
100.0000 mg | ORAL_TABLET | Freq: Every day | ORAL | Status: DC
  Administered 2013-08-19: 100 mg via ORAL

## 2013-08-19 MED ORDER — THIAMINE HCL 100 MG/ML IJ SOLN: 100.0000 mg | Freq: Every day | INTRAMUSCULAR | Status: DC

## 2013-08-19 MED ORDER — SODIUM CHLORIDE 0.9 % IV SOLN
1000.0000 mL | Freq: Once | INTRAVENOUS | Status: AC
  Administered 2013-08-19: 1000 mL via INTRAVENOUS

## 2013-08-19 MED ORDER — LORAZEPAM 2 MG/ML IJ SOLN
0.0000 mg | Freq: Two times a day (BID) | INTRAMUSCULAR | Status: DC
  Filled 2013-08-19 (×2): qty 1

## 2013-08-19 MED ORDER — SODIUM CHLORIDE 0.9 % IV SOLN
1000.0000 mL | INTRAVENOUS | Status: DC
  Administered 2013-08-19: 1000 mL via INTRAVENOUS

## 2013-08-19 MED ORDER — SODIUM CHLORIDE 0.9 % IV SOLN
1000.0000 mL | Freq: Once | INTRAVENOUS | Status: AC
Start: 2013-08-19 — End: 2013-08-19
  Administered 2013-08-19: 1000 mL via INTRAVENOUS

## 2013-08-19 MED ORDER — LORAZEPAM 2 MG/ML IJ SOLN
1.0000 mg | Freq: Once | INTRAMUSCULAR | Status: AC
  Administered 2013-08-19: 20:00:00 via INTRAVENOUS
  Filled 2013-08-19: qty 1

## 2013-08-19 MED ORDER — VITAMIN B-1 100 MG PO TABS
ORAL_TABLET | ORAL | Status: DC
Start: 2013-08-19 — End: 2013-08-20
  Filled 2013-08-19: qty 1

## 2013-08-19 MED ORDER — LORAZEPAM 1 MG PO TABS
0.0000 mg | ORAL_TABLET | Freq: Four times a day (QID) | ORAL | Status: DC
  Administered 2013-08-19: 1 mg via ORAL

## 2013-08-19 NOTE — ED Notes (Signed)
Pt states his last drink was 12.30am this morning. Got up a fell this evening, pt has tremor, MD at the bedside

## 2013-08-19 NOTE — ED Provider Notes (Signed)
CSN: [id number]     Arrival date & time 08/19/13  1917 History   First MD Initiated Contact with Patient 08/19/13 1920     Chief Complaint  Patient presents with  . Head Laceration  . Tremors      The history is provided by the patient.   patient reports long-standing history of alcohol abuse.  He states his last drink was this morning.  Has been trying to quit.  He felt somewhat shaky when he got up to go use the restroom he became slightly lightheaded and fell against the dresser resulting in a laceration of his left parietal scalp.  No loss consciousness.  No neck pain.  No weakness of his arms or legs.  No headache at this time.  Her use of anticoagulants.  He states he is wanting to quit alcohol.  He states he doesn't multiple times before on his own he usually uses Xanax.  Currently he is out of Xanax.  Reports that he has a prescription the pharmacy and he believes he can fill this in the morning.  Past Medical History  Diagnosis Date  . Allergy   . Gout   . Hyperlipidemia   . EtOH dependence   . Hemothorax on right   . Lisfranc's dislocation 10/20/2011  . Right fibular fracture 10/20/2011   Past Surgical History  Procedure Laterality Date  . Appendectomy    . Hernia repair    . Tonsilectomy, adenoidectomy, bilateral myringotomy and tubes    . 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 Partial hip arthroplasty  2010    left   Family History  Problem Relation Age of Onset  . Arthritis Mother   . Tuberculosis Father    History  Substance Use Topics  . Smoking status: Former Smoker -- 1.00 packs/day for 10 years    Quit date: 08/09/1969  . Smokeless tobacco: Never Used  . Alcohol Use: Yes    Review of Systems  All other systems reviewed and are negative.     Allergies  Atorvastatin  Home Medications   Prior to Admission medications   Medication Sig Start Date End Date Taking? Authorizing Provider  ALPRAZolam Duanne Moron) 1 MG tablet Take 0.5-1 mg by mouth 2 (two)  times daily as needed. For anxiety 06/24/13  Yes Bruce Kendall Flack, MD  cetirizine (ZYRTEC) 10 MG tablet Take 10 mg by mouth daily as needed for allergies.   Yes Historical Provider, MD  ibuprofen (ADVIL,MOTRIN) 800 MG tablet Take 800 mg by mouth every 6 (six) hours as needed for mild pain or moderate pain.  04/11/12  Yes Historical Provider, MD  omeprazole (PRILOSEC) 20 MG capsule Take 20 mg by mouth daily as needed (for acid reflux).   Yes Historical Provider, MD  simvastatin (ZOCOR) 40 MG tablet Take 40 mg by mouth at bedtime.   Yes Historical Provider, MD   BP 99/89  Pulse 76  Temp(Src) 97.5 F (36.4 C) (Oral)  Resp 18  Ht 5' 6.5" (1.689 m)  Wt 200 lb (90.719 kg)  BMI 31.80 kg/m2  SpO2 95% Physical Exam  Nursing note and vitals reviewed. Constitutional: He is oriented to person, place, and time. He appears well-developed and well-nourished.  HENT:  Head: Normocephalic and atraumatic.  Small lateral abrasion to left posterior parietal scalp.  No active bleeding at this time.  No associated hematoma.  Eyes: EOM are normal.  Neck: Normal range of motion.  C-spine nontender.  C-spine cleared by Nexus criteria.  Full range of motion in all directions without pain.  Cardiovascular: Normal rate, regular rhythm, normal heart sounds and intact distal pulses.   Pulmonary/Chest: Effort normal and breath sounds normal. No respiratory distress.  Abdominal: Soft. He exhibits no distension. There is no tenderness.  Musculoskeletal: Normal range of motion.  Neurological: He is alert and oriented to person, place, and time.  Skin: Skin is warm and dry.  Psychiatric: He has a normal mood and affect. Judgment normal.    ED Course  Procedures (including critical care time)  LACERATION REPAIR Performed by: Hoy Morn Consent: Verbal consent obtained. Risks and benefits: risks, benefits and alternatives were discussed Patient identity confirmed: provided demographic data Time out performed  prior to procedure Prepped and Draped in normal sterile fashion Wound explored Laceration Location: Left posterior parietal scalp Laceration Length: 2 cm No Foreign Bodies seen or palpated Anesthesia: none Irrigation method: syringe Amount of cleaning: standard Skin closure: Staples  Number of sutures or staples: 3  Technique: Staples  Patient tolerance: Patient tolerated the procedure well with no immediate complications.   Labs Review Labs Reviewed - No data to display  Imaging Review No results found.   EKG Interpretation None      MDM   Final diagnoses:  None    Patient is overall well-appearing in emergency department.  He is not tachycardic.  His C. with scores very low.  She's given some Ativan IV fluids in the emergency department feels much better.  Laceration was repaired with staples.  No indication for imaging.  He understands return to the ER for new or worsening symptoms.  He will continue to try and manage his alcohol abuse at home with the use of his home Xanax this is done before in the past.  He is not on additional assistance at this time.    Hoy Morn, MD 08/19/13 2116

## 2013-08-19 NOTE — ED Notes (Signed)
Pt reports he has been drinking every day for a year. Decided to quit today. Got up to go to the bathroom, felt shaking, and fell against the dresser. Laceration noted to the back of the head. Bleeding controlled at this time. Pt reports he is "wanting something to help with the shaking". States he has been taking Xanax.

## 2013-08-19 NOTE — ED Notes (Signed)
Patient given discharge instruction, verbalized understand. IV removed, band aid applied. Patient ambulatory out of the department.  

## 2013-08-19 NOTE — ED Notes (Signed)
Pt on cardiac monitor.

## 2013-08-19 NOTE — ED Notes (Signed)
MD at the bedside, head lac, cleaned and stapled. Pt tolerated well

## 2013-08-29 ENCOUNTER — Encounter (HOSPITAL_COMMUNITY): Payer: Self-pay | Admitting: Emergency Medicine

## 2013-08-29 ENCOUNTER — Emergency Department (HOSPITAL_COMMUNITY)
Admission: EM | Admit: 2013-08-29 | Discharge: 2013-08-29 | Disposition: A | Discharge status: Home / Self Care | Payer: Medicare Other | Attending: Emergency Medicine | Admitting: Emergency Medicine

## 2013-08-29 DIAGNOSIS — Z4802 Encounter for removal of sutures: Secondary | ICD-10-CM

## 2013-08-29 DIAGNOSIS — E785 Hyperlipidemia, unspecified: Secondary | ICD-10-CM | POA: Insufficient documentation

## 2013-08-29 DIAGNOSIS — Z79899 Other long term (current) drug therapy: Secondary | ICD-10-CM | POA: Insufficient documentation

## 2013-08-29 DIAGNOSIS — M2550 Pain in unspecified joint: Secondary | ICD-10-CM | POA: Insufficient documentation

## 2013-08-29 DIAGNOSIS — Z87891 Personal history of nicotine dependence: Secondary | ICD-10-CM | POA: Insufficient documentation

## 2013-08-29 DIAGNOSIS — Z8709 Personal history of other diseases of the respiratory system: Secondary | ICD-10-CM | POA: Insufficient documentation

## 2013-08-29 DIAGNOSIS — Z8781 Personal history of (healed) traumatic fracture: Secondary | ICD-10-CM | POA: Insufficient documentation

## 2013-08-29 DIAGNOSIS — Z87828 Personal history of other (healed) physical injury and trauma: Secondary | ICD-10-CM | POA: Insufficient documentation

## 2013-08-29 NOTE — ED Notes (Signed)
3 staples removed from back of scalp that were placed per pt 08/19/13. PT denies any pain. Incision is healed and no drainage or s/s of infection present.

## 2013-08-29 NOTE — ED Provider Notes (Signed)
CSN: [id number]     Arrival date & time 08/29/13  0820 History   First MD Initiated Contact with Patient 08/29/13 [phone]     Chief Complaint  Patient presents with  . Suture / Staple Removal     (Consider location/radiation/quality/duration/timing/severity/associated sxs/prior Treatment) Patient is a 68 y.o. male presenting with suture removal. The history is provided by the patient.  Suture / Staple Removal This is a new problem. The current episode started 1 to 4 weeks ago. The problem occurs constantly. The problem has been gradually improving. Associated symptoms include arthralgias. Pertinent negatives include no abdominal pain, chest pain, coughing, fever, headaches, nausea, neck pain, visual change, vomiting or weakness. Nothing aggravates the symptoms. He has tried nothing for the symptoms. The treatment provided moderate relief.    Past Medical History  Diagnosis Date  . Allergy   . Gout   . Hyperlipidemia   . EtOH dependence   . Hemothorax on right   . Lisfranc's dislocation 10/20/2011  . Right fibular fracture 10/20/2011   Past Surgical History  Procedure Laterality Date  . Appendectomy    . Hernia repair    . Tonsilectomy, adenoidectomy, bilateral myringotomy and tubes    . 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 Partial hip arthroplasty  2010    left   Family History  Problem Relation Age of Onset  . Arthritis Mother   . Tuberculosis Father    History  Substance Use Topics  . Smoking status: Former Smoker -- 1.00 packs/day for 10 years    Quit date: 08/09/1969  . Smokeless tobacco: Never Used  . Alcohol Use: Yes    Review of Systems  Constitutional: Negative for fever and activity change.       All ROS Neg except as noted in HPI  HENT: Negative for nosebleeds.   Eyes: Negative for photophobia and discharge.  Respiratory: Negative for cough, shortness of breath and wheezing.   Cardiovascular: Negative for chest pain and palpitations.  Gastrointestinal:  Negative for nausea, vomiting, abdominal pain and blood in stool.  Genitourinary: Negative for dysuria, frequency and hematuria.  Musculoskeletal: Positive for arthralgias. Negative for back pain and neck pain.  Skin: Negative.   Neurological: Negative for dizziness, seizures, speech difficulty, weakness and headaches.  Psychiatric/Behavioral: Negative for hallucinations and confusion.      Allergies  Atorvastatin  Home Medications   Prior to Admission medications   Medication Sig Start Date End Date Taking? Authorizing Provider  ALPRAZolam Duanne Moron) 1 MG tablet Take 0.5-1 mg by mouth 2 (two) times daily as needed. For anxiety 06/24/13  Yes Bruce Kendall Flack, MD  cetirizine (ZYRTEC) 10 MG tablet Take 10 mg by mouth daily as needed for allergies.   Yes Historical Provider, MD  omeprazole (PRILOSEC) 20 MG capsule Take 20 mg by mouth daily as needed (for acid reflux).   Yes Historical Provider, MD  simvastatin (ZOCOR) 40 MG tablet Take 40 mg by mouth at bedtime.   Yes Historical Provider, MD  zolpidem (AMBIEN) 10 MG tablet Take 0.5-1 tablets by mouth daily.  05/22/13  Yes Historical Provider, MD   BP 153/76  Pulse 74  Temp(Src) 97.7 F (36.5 C) (Oral)  Resp 16  Ht 5\' 6"  (1.676 m)  Wt 190 lb (86.183 kg)  BMI 30.68 kg/m2  SpO2 96% Physical Exam  Nursing note and vitals reviewed. Constitutional: He is oriented to person, place, and time. He appears well-developed and well-nourished.  Non-toxic appearance.  HENT:  Head: Normocephalic.  Right Ear: Tympanic membrane and external ear normal.  Left Ear: Tympanic membrane and external ear normal.  3 staples in the scalp . No satelite abscess noted. No red streaks present. No hot areas.  Eyes: EOM and lids are normal. Pupils are equal, round, and reactive to light.  Neck: Normal range of motion. Neck supple. Carotid bruit is not present.  Cardiovascular: Normal rate, regular rhythm, normal heart sounds, intact distal pulses and normal pulses.    Pulmonary/Chest: Breath sounds normal. No respiratory distress.  Abdominal: Soft. Bowel sounds are normal. There is no tenderness. There is no guarding.  Musculoskeletal: Normal range of motion.  Lymphadenopathy:       Head (right side): No submandibular adenopathy present.       Head (left side): No submandibular adenopathy present.    He has no cervical adenopathy.  Neurological: He is alert and oriented to person, place, and time. He has normal strength. No cranial nerve deficit or sensory deficit.  Skin: Skin is warm and dry.  Psychiatric: He has a normal mood and affect. His speech is normal.    ED Course Staples removed without problem by nursing staff.   Procedures (including critical care time) Labs Review Labs Reviewed - No data to display  Imaging Review No results found.   EKG Interpretation None      MDM Patient sustained a laceration to the back of the head approximately a week ago. He returns today for staple removal. The vital signs are well within normal limits. Pulse oximetry is 96% on room air. Within normal limits by my interpretation. The staples were removed without problem. There is no evidence of any secondary infection. Feel that it is safe for the patient to be discharged.    Final diagnoses:  None    *I have reviewed nursing notes, vital signs, and all appropriate lab and imaging results for this patient.Lenox Ahr, PA-C 08/29/13 [phone]

## 2013-08-29 NOTE — ED Notes (Signed)
Staple removal from back of head.  Denies complaints.

## 2013-09-04 NOTE — ED Provider Notes (Signed)
Medical screening examination/treatment/procedure(s) were performed by non-physician practitioner and as supervising physician I was immediately available for consultation/collaboration.   EKG Interpretation None        Tanna Furry, MD 09/04/13 [phone]

## 2013-09-17 ENCOUNTER — Telehealth: Payer: Self-pay | Admitting: Internal Medicine

## 2013-09-17 NOTE — Telephone Encounter (Signed)
Pt is requesting re-fill on the following: ALPRAZolam (XANAX) 1 MG tablet and zolpidem (AMBIEN) 10 MG tablet   [facility] in [place]

## 2013-09-18 NOTE — Telephone Encounter (Signed)
Is this okay?  He seen [place] in December

## 2013-09-19 NOTE — Telephone Encounter (Signed)
No refills

## 2013-09-23 NOTE — Telephone Encounter (Signed)
Pt aware.

## 2013-09-24 ENCOUNTER — Ambulatory Visit: Payer: Medicare Other | Admitting: Family

## 2013-09-26 ENCOUNTER — Other Ambulatory Visit: Payer: Self-pay | Admitting: Internal Medicine

## 2013-09-30 ENCOUNTER — Encounter: Payer: Medicare Other | Admitting: Internal Medicine

## 2013-09-30 ENCOUNTER — Ambulatory Visit: Payer: Medicare Other | Admitting: Family

## 2013-10-01 ENCOUNTER — Telehealth: Payer: Self-pay | Admitting: Internal Medicine

## 2013-10-01 NOTE — Telephone Encounter (Signed)
Pt has cpe for 7/1. But would like to know if you can refill his ALPRAZolam (XANAX) 1 MG tablet Until his appt. [facility]

## 2013-10-01 NOTE — Telephone Encounter (Signed)
Per Dr. Leanne Chang note on 06/21/2013: He really should not take either given his other medical problems. For now ok to refill the xanax. He should stop [facility] to fill?

## 2013-10-02 NOTE — Telephone Encounter (Signed)
Temporary supply. Discuss weaning at [facility].

## 2013-10-02 NOTE — Telephone Encounter (Signed)
Per Abby Potash, disregard previous note, please follow Dr. Leanne Chang note to refuse refill. Discuss at [place]

## 2013-10-03 ENCOUNTER — Ambulatory Visit: Payer: Medicare Other | Admitting: Family

## 2013-10-08 ENCOUNTER — Ambulatory Visit: Payer: Medicare Other | Admitting: Family

## 2013-10-13 ENCOUNTER — Encounter: Payer: Self-pay | Admitting: Family

## 2013-10-13 ENCOUNTER — Ambulatory Visit (INDEPENDENT_AMBULATORY_CARE_PROVIDER_SITE_OTHER): Payer: Medicare Other | Admitting: Family

## 2013-10-13 VITALS — BP 152/94 | Temp 97.9°F | Ht 66.0 in | Wt 209.0 lb

## 2013-10-13 DIAGNOSIS — G4700 Insomnia, unspecified: Secondary | ICD-10-CM

## 2013-10-13 DIAGNOSIS — F411 Generalized anxiety disorder: Secondary | ICD-10-CM

## 2013-10-13 MED ORDER — CLONAZEPAM 1 MG PO TABS: 1.0000 mg | ORAL_TABLET | Freq: Two times a day (BID) | ORAL | Status: DC

## 2013-10-13 MED ORDER — QUETIAPINE FUMARATE 25 MG PO TABS: 25.0000 mg | ORAL_TABLET | Freq: Every day | ORAL | Status: DC

## 2013-10-13 NOTE — Patient Instructions (Signed)
Insomnia Insomnia is frequent trouble falling and/or staying asleep. Insomnia can be a long term problem or a short term problem. Both are common. Insomnia can be a short term problem when the wakefulness is related to a certain stress or worry. Long term insomnia is often related to ongoing stress during waking hours and/or poor sleeping habits. Overtime, sleep deprivation itself can make the problem worse. Every little thing feels more severe because you are overtired and your ability to cope is decreased. CAUSES   Stress, anxiety, and depression.  Poor sleeping habits.  Distractions such as TV in the bedroom.  Naps close to bedtime.  Engaging in emotionally charged conversations before bed.  Technical reading before sleep.  Alcohol and other sedatives. They may make the problem worse. They can hurt normal sleep patterns and normal dream activity.  Stimulants such as caffeine for several hours prior to bedtime.  Pain syndromes and shortness of breath can cause insomnia.  Exercise late at night.  Changing time zones may cause sleeping problems (jet lag). It is sometimes helpful to have someone observe your sleeping patterns. They should look for periods of not breathing during the night (sleep apnea). They should also look to see how long those periods last. If you live alone or observers are uncertain, you can also be observed at a sleep clinic where your sleep patterns will be professionally monitored. Sleep apnea requires a checkup and treatment. Give your caregivers your medical history. Give your caregivers observations your family has made about your sleep.  SYMPTOMS   Not feeling rested in the morning.  Anxiety and restlessness at bedtime.  Difficulty falling and staying asleep. TREATMENT   Your caregiver may prescribe treatment for an underlying medical disorders. Your caregiver can give advice or help if you are using alcohol or other drugs for self-medication. Treatment  of underlying problems will usually eliminate insomnia problems.  Medications can be prescribed for short time use. They are generally not recommended for lengthy use.  Over-the-counter sleep medicines are not recommended for lengthy use. They can be habit forming.  You can promote easier sleeping by making lifestyle changes such as:  Using relaxation techniques that help with breathing and reduce muscle tension.  Exercising earlier in the day.  Changing your diet and the time of your last meal. No night time snacks.  Establish a regular time to go to bed.  Counseling can help with stressful problems and worry.  Soothing music and white noise may be helpful if there are background noises you cannot remove.  Stop tedious detailed work at least one hour before bedtime. HOME CARE INSTRUCTIONS   Keep a diary. Inform your caregiver about your progress. This includes any medication side effects. See your caregiver regularly. Take note of:  Times when you are asleep.  Times when you are awake during the night.  The quality of your sleep.  How you feel the next day. This information will help your caregiver care for you.  Get out of bed if you are still awake after 15 minutes. Read or do some quiet activity. Keep the lights down. Wait until you feel sleepy and go back to bed.  Keep regular sleeping and waking hours. Avoid naps.  Exercise regularly.  Avoid distractions at bedtime. Distractions include watching television or engaging in any intense or detailed activity like attempting to balance the household checkbook.  Develop a bedtime ritual. Keep a familiar routine of bathing, brushing your teeth, climbing into bed at the same   time each night, listening to soothing music. Routines increase the success of falling to sleep faster.  Use relaxation techniques. This can be using breathing and muscle tension release routines. It can also include visualizing peaceful scenes. You can  also help control troubling or intruding thoughts by keeping your mind occupied with boring or repetitive thoughts like the old concept of counting sheep. You can make it more creative like imagining planting one beautiful flower after another in your backyard garden.  During your day, work to eliminate stress. When this is not possible use some of the previous suggestions to help reduce the anxiety that accompanies stressful situations. MAKE SURE YOU:   Understand these instructions.  Will watch your condition.  Will get help right away if you are not doing well or get worse. Document Released: 03/31/2000 Document Revised: 06/26/2011 Document Reviewed: 05/01/2007 ExitCare Patient Information 2015 [employer]. This information is not intended to replace advice given to you by your health care provider. Make sure you discuss any questions you have with your health care provider.  

## 2013-10-13 NOTE — Progress Notes (Signed)
Pre visit review using our clinic review tool, if applicable. No additional management support is needed unless otherwise documented below in the visit note. 

## 2013-10-13 NOTE — Progress Notes (Signed)
Subjective:    Patient ID: Victor Diaz, male    DOB: 1946/03/08, 68 y.o.   MRN: [id number]  HPI  68 year old white male, with a history of alcohol abuse is in today Hx of insomnia and anxiety. He is requesting a refill on Xanax and Ambien that he takes daily. Dr. Leanne Chang has suggested that we wean him off of these medications due to his history. Continues to have difficulty falling asleep at night.   Review of Systems  Constitutional: Negative.   Respiratory: Negative.   Cardiovascular: Negative.   Gastrointestinal: Negative.   Endocrine: Negative.   Genitourinary: Negative.   Musculoskeletal: Negative.   Skin: Negative.   Neurological: Negative.   Psychiatric/Behavioral: Positive for agitation. The patient is nervous/anxious.    Past Medical History  Diagnosis Date  . Allergy   . Gout   . Hyperlipidemia   . EtOH dependence   . Hemothorax on right   . Lisfranc's dislocation 10/20/2011  . Right fibular fracture 10/20/2011    History   Social History  . Marital Status: Widowed    Spouse Name: N/A    Number of Children: N/A  . Years of Education: N/A   Occupational History  . Not on file.   Social History Main Topics  . Smoking status: Former Smoker -- 1.00 packs/day for 10 years    Quit date: 08/09/1969  . Smokeless tobacco: Never Used  . Alcohol Use: Yes  . Drug Use: No  . Sexual Activity: Not on file   Other Topics Concern  . Not on file   Social History Narrative   Lives in [place],  Has two sons who live with him   He is self employed.   He does not have a valid driver's license because of alcohol-related in fractions      Son drives him ;has MS          Past Surgical History  Procedure Laterality Date  . Appendectomy    . Hernia repair    . Tonsilectomy, adenoidectomy, bilateral myringotomy and tubes    . 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 Partial hip arthroplasty  2010    left    Family History  Problem Relation Age of Onset    . Arthritis Mother   . Tuberculosis Father     Allergies  Allergen Reactions  . Atorvastatin Anaphylaxis    facial swelling    Current Outpatient Prescriptions on File Prior to Visit  Medication Sig Dispense Refill  . cetirizine (ZYRTEC) 10 MG tablet TAKE 1 TABLET BY MOUTH ONCE A DAY AS NEEDED.  90 tablet  1  . omeprazole (PRILOSEC) 20 MG capsule Take 20 mg by mouth daily as needed (for acid reflux).      . simvastatin (ZOCOR) 40 MG tablet Take 40 mg by mouth at bedtime.       No current facility-administered medications on file prior to visit.    BP 152/94  Temp(Src) 97.9 F (36.6 C) (Oral)  Ht 5\' 6"  (1.676 m)  Wt 209 lb (94.802 kg)  BMI 33.75 kg/m2chart    Objective:   Physical Exam  Constitutional: He is oriented to person, place, and time. He appears well-developed and well-nourished.  Neck: Normal range of motion. Neck supple.  Cardiovascular: Normal rate, regular rhythm and normal heart sounds.   Pulmonary/Chest: Effort normal and breath sounds normal.  Musculoskeletal: Normal range of motion.  Neurological: He is alert and oriented to person, place, and time.  Skin: Skin is warm and dry.  Psychiatric: He has a normal mood and affect.          Assessment & Plan:  Victor Diaz was seen today for medication refill.  Diagnoses and associated orders for this visit:  Insomnia  Anxiety state, unspecified  Other Orders - clonazePAM (KLONOPIN) 1 MG tablet; Take 1 tablet (1 mg total) by mouth 2 (two) times daily. - QUEtiapine (SEROQUEL) 25 MG tablet; Take 1 tablet (25 mg total) by mouth at bedtime.   Goal is to wean controlled medications.

## 2013-10-15 ENCOUNTER — Encounter: Payer: Medicare Other | Admitting: Family

## 2013-11-05 ENCOUNTER — Encounter: Payer: Medicare Other | Admitting: Family

## 2013-11-11 ENCOUNTER — Encounter: Payer: Medicare Other | Admitting: Family

## 2013-11-13 ENCOUNTER — Other Ambulatory Visit: Payer: Self-pay | Admitting: Internal Medicine

## 2013-11-18 ENCOUNTER — Encounter: Payer: Medicare Other | Admitting: Family

## 2013-11-19 ENCOUNTER — Ambulatory Visit (INDEPENDENT_AMBULATORY_CARE_PROVIDER_SITE_OTHER): Payer: Medicare Other | Admitting: Family

## 2013-11-19 ENCOUNTER — Encounter: Payer: Self-pay | Admitting: Family

## 2013-11-19 ENCOUNTER — Other Ambulatory Visit: Payer: Self-pay | Admitting: Family

## 2013-11-19 ENCOUNTER — Telehealth: Payer: Self-pay | Admitting: Internal Medicine

## 2013-11-19 VITALS — BP 148/92 | HR 104 | Temp 98.1°F | Ht 66.0 in | Wt 209.0 lb

## 2013-11-19 DIAGNOSIS — Z23 Encounter for immunization: Secondary | ICD-10-CM

## 2013-11-19 DIAGNOSIS — Z0000 Encounter for general adult medical examination without abnormal findings: Secondary | ICD-10-CM

## 2013-11-19 DIAGNOSIS — E785 Hyperlipidemia, unspecified: Secondary | ICD-10-CM

## 2013-11-19 DIAGNOSIS — R5381 Other malaise: Secondary | ICD-10-CM

## 2013-11-19 DIAGNOSIS — R7989 Other specified abnormal findings of blood chemistry: Secondary | ICD-10-CM

## 2013-11-19 DIAGNOSIS — R5383 Other fatigue: Secondary | ICD-10-CM

## 2013-11-19 DIAGNOSIS — R945 Abnormal results of liver function studies: Principal | ICD-10-CM

## 2013-11-19 DIAGNOSIS — R42 Dizziness and giddiness: Secondary | ICD-10-CM

## 2013-11-19 LAB — POCT URINALYSIS DIPSTICK
BILIRUBIN UA: NEGATIVE
GLUCOSE UA: NEGATIVE
Ketones, UA: NEGATIVE
Leukocytes, UA: NEGATIVE
NITRITE UA: NEGATIVE
Protein, UA: NEGATIVE
RBC UA: NEGATIVE
Spec Grav, UA: <=1.005
UROBILINOGEN UA: 0.2
pH, UA: 5.5

## 2013-11-19 LAB — LIPID PANEL
CHOL/HDL RATIO: 4
CHOLESTEROL: 160 mg/dL (ref 0–200)
HDL: 36.5 mg/dL — AB (ref >39.00)
LDL Cholesterol: 88 mg/dL (ref 0–99)
NonHDL: 123.5
TRIGLYCERIDES: 178 mg/dL — AB (ref 0.0–149.0)
VLDL: 35.6 mg/dL (ref 0.0–40.0)

## 2013-11-19 LAB — BASIC METABOLIC PANEL
BUN: 11 mg/dL (ref 6–23)
CO2: 24 mEq/L (ref 19–32)
Calcium: 9.6 mg/dL (ref 8.4–10.5)
Chloride: 102 mEq/L (ref 96–112)
Creatinine, Ser: 1.3 mg/dL (ref 0.4–1.5)
GFR: 56.02 mL/min — ABNORMAL LOW (ref >60.00)
GLUCOSE: 108 mg/dL — AB (ref 70–99)
Potassium: 4.4 mEq/L (ref 3.5–5.1)
Sodium: 134 mEq/L — ABNORMAL LOW (ref 135–145)

## 2013-11-19 LAB — CBC WITH DIFFERENTIAL/PLATELET
BASOS PCT: 0.5 % (ref 0.0–3.0)
Basophils Absolute: 0 10*3/uL (ref 0.0–0.1)
EOS PCT: 2.8 % (ref 0.0–5.0)
Eosinophils Absolute: 0.2 10*3/uL (ref 0.0–0.7)
HCT: 42.8 % (ref 39.0–52.0)
Hemoglobin: 14.5 g/dL (ref 13.0–17.0)
Lymphocytes Relative: 27.9 % (ref 12.0–46.0)
Lymphs Abs: 1.9 10*3/uL (ref 0.7–4.0)
MCHC: 34 g/dL (ref 30.0–36.0)
MCV: 97.4 fl (ref 78.0–100.0)
MONO ABS: 0.8 10*3/uL (ref 0.1–1.0)
MONOS PCT: 12.1 % — AB (ref 3.0–12.0)
NEUTROS PCT: 56.7 % (ref 43.0–77.0)
Neutro Abs: 3.9 10*3/uL (ref 1.4–7.7)
PLATELETS: 206 10*3/uL (ref 150.0–400.0)
RBC: 4.39 Mil/uL (ref 4.22–5.81)
RDW: 13.3 % (ref 11.5–15.5)
WBC: 6.9 10*3/uL (ref 4.0–10.5)

## 2013-11-19 LAB — HEPATIC FUNCTION PANEL
ALBUMIN: 4.1 g/dL (ref 3.5–5.2)
ALT: 69 U/L — ABNORMAL HIGH (ref 0–53)
AST: 84 U/L — ABNORMAL HIGH (ref 0–37)
Alkaline Phosphatase: 39 U/L (ref 39–117)
BILIRUBIN TOTAL: 0.6 mg/dL (ref 0.2–1.2)
Bilirubin, Direct: 0.2 mg/dL (ref 0.0–0.3)
Total Protein: 7.3 g/dL (ref 6.0–8.3)

## 2013-11-19 LAB — TESTOSTERONE: Testosterone: 727.02 ng/dL (ref 300.00–890.00)

## 2013-11-19 LAB — TSH: TSH: 2.37 u[IU]/mL (ref 0.35–4.50)

## 2013-11-19 NOTE — Progress Notes (Signed)
Pre visit review using our clinic review tool, if applicable. No additional management support is needed unless otherwise documented below in the visit note. 

## 2013-11-19 NOTE — Telephone Encounter (Signed)
Pt would like results from today. Pt saw NP

## 2013-11-19 NOTE — Progress Notes (Signed)
Subjective:    Patient ID: Victor Diaz, male    DOB: 03-Aug-1945, 68 y.o.   MRN: [id number]  HPI 68 year old white male with a history of GERD, gout, depression, hyperlipidemia is in today for complete physical exam. Has concerns today of dizziness and fatigue has worsened over the last 2 weeks. Reports attempting to exercise. No acute symptoms with exercise. No chest pain or palpitations.  Review of Systems  Constitutional: Negative.   HENT: Negative.   Eyes: Negative.   Respiratory: Negative.   Cardiovascular: Negative.   Gastrointestinal: Negative.   Endocrine: Negative.   Genitourinary: Negative.   Musculoskeletal: Negative.   Skin: Negative.   Allergic/Immunologic: Negative.   Neurological: Negative.   Hematological: Negative.   Psychiatric/Behavioral: Negative.    Past Medical History  Diagnosis Date  . Allergy   . Gout   . Hyperlipidemia   . EtOH dependence   . Hemothorax on right   . Lisfranc's dislocation 10/20/2011  . Right fibular fracture 10/20/2011    History   Social History  . Marital Status: Widowed    Spouse Name: N/A    Number of Children: N/A  . Years of Education: N/A   Occupational History  . Not on file.   Social History Main Topics  . Smoking status: Former Smoker -- 1.00 packs/day for 10 years    Quit date: 08/09/1969  . Smokeless tobacco: Never Used  . Alcohol Use: Yes  . Drug Use: No  . Sexual Activity: Not on file   Other Topics Concern  . Not on file   Social History Narrative   Lives in [place],  Has two sons who live with him   He is self employed.   He does not have a valid driver's license because of alcohol-related in fractions      Son drives him ;has MS          Past Surgical History  Procedure Laterality Date  . Appendectomy    . Hernia repair    . Tonsilectomy, adenoidectomy, bilateral myringotomy and tubes    . 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 Partial hip arthroplasty  2010    left     Family History  Problem Relation Age of Onset  . Arthritis Mother   . Tuberculosis Father     Allergies  Allergen Reactions  . Atorvastatin Anaphylaxis    facial swelling    Current Outpatient Prescriptions on File Prior to Visit  Medication Sig Dispense Refill  . cetirizine (ZYRTEC) 10 MG tablet TAKE 1 TABLET BY MOUTH ONCE A DAY AS NEEDED.  90 tablet  1  . clonazePAM (KLONOPIN) 1 MG tablet Take 1 tablet (1 mg total) by mouth 2 (two) times daily.  60 tablet  1  . omeprazole (PRILOSEC) 20 MG capsule Take 20 mg by mouth daily as needed (for acid reflux).      . QUEtiapine (SEROQUEL) 25 MG tablet Take 1 tablet (25 mg total) by mouth at bedtime.  30 tablet  1  . simvastatin (ZOCOR) 40 MG tablet TAKE (1) TABLET BY MOUTH AT BEDTIME.  30 tablet  5   No current facility-administered medications on file prior to visit.    BP 148/92  Pulse 104  Temp(Src) 98.1 F (36.7 C) (Oral)  Ht 5\' 6"  (1.676 m)  Wt 209 lb (94.802 kg)  BMI 33.75 kg/m2  SpO2 97%chart    Objective:   Physical Exam  Constitutional: He is oriented to person, place, and  time. He appears well-developed and well-nourished.  HENT:  Right Ear: External ear normal.  Left Ear: External ear normal.  Nose: Nose normal.  Mouth/Throat: Oropharynx is clear and moist.  Eyes: Conjunctivae and EOM are normal. Pupils are equal, round, and reactive to light.  Neck: Normal range of motion. Neck supple. No thyromegaly present.  Cardiovascular: Normal rate, regular rhythm and normal heart sounds.   Pulmonary/Chest: Effort normal and breath sounds normal.  Abdominal: Soft. Bowel sounds are normal.  Genitourinary: Rectum normal, prostate normal and penis normal.  Musculoskeletal: Normal range of motion. He exhibits no edema and no tenderness.  Neurological: He is alert and oriented to person, place, and time. He has normal reflexes. He displays normal reflexes. No cranial nerve deficit. Coordination normal.  Skin: Skin is warm  and dry.  Psychiatric: He has a normal mood and affect.          Assessment & Plan:  Victor Diaz was seen today for annual exam.  Diagnoses and associated orders for this visit:  Preventative health care - Basic Metabolic Panel - Hepatic Function Panel - CBC with Differential - POCT urinalysis dipstick - EKG 12-Lead  Other malaise and fatigue - Basic Metabolic Panel - Hepatic Function Panel - CBC with Differential - TSH - Testosterone - EKG 12-Lead  Dizziness and giddiness - Basic Metabolic Panel - Hepatic Function Panel - EKG 12-Lead  Other and unspecified hyperlipidemia - Lipid Panel  Need for prophylactic vaccination against Streptococcus pneumoniae (pneumococcus) - Pneumococcal conjugate vaccine 13-valent   Call the office with any questions or concerns. Recheck as scheduled and as needed.

## 2013-11-19 NOTE — Patient Instructions (Addendum)
Cardiac Diet This diet can help prevent heart disease and stroke. Many factors influence your heart health, including eating and exercise habits. Coronary risk rises a lot with abnormal blood fat (lipid) levels. Cardiac meal planning includes limiting unhealthy fats, increasing healthy fats, and making other small dietary changes. General guidelines are as follows:  Adjust calorie intake to reach and maintain desirable body weight.  Limit total fat intake to less than 30% of total calories. Saturated fat should be less than 7% of calories.  Saturated fats are found in animal products and in some vegetable products. Saturated vegetable fats are found in coconut oil, cocoa butter, palm oil, and palm kernel oil. Read labels carefully to avoid these products as much as possible. Use butter in moderation. Choose tub margarines and oils that have 2 grams of fat or less. Good cooking oils are canola and olive oils.  Practice low-fat cooking techniques. Do not fry food. Instead, broil, bake, boil, steam, grill, roast on a rack, stir-fry, or microwave it. Other fat reducing suggestions include:  Remove the skin from poultry.  Remove all visible fat from meats.  Skim the fat off stews, soups, and gravies before serving them.  Steam vegetables in water or broth instead of sauting them in fat.  Avoid foods with trans fat (or hydrogenated oils), such as commercially fried foods and commercially baked goods. Commercial shortening and deep-frying fats will contain trans fat.  Increase intake of fruits, vegetables, whole grains, and legumes to replace foods high in fat.  Increase consumption of nuts, legumes, and seeds to at least 4 servings weekly. One serving of a legume equals  cup, and 1 serving of nuts or seeds equals  cup.  Choose whole grains more often. Have 3 servings per day (a serving is 1 ounce [oz]).  Eat 4 to 5 servings of vegetables per day. A serving of vegetables is 1 cup of raw leafy  vegetables;  cup of raw or cooked cut-up vegetables;  cup of vegetable juice.  Eat 4 to 5 servings of fruit per day. A serving of fruit is 1 medium whole fruit;  cup of dried fruit;  cup of fresh, frozen, or canned fruit;  cup of 100% fruit juice.  Increase your intake of dietary fiber to 20 to 30 grams per day. Insoluble fiber may help lower your risk of heart disease and may help curb your appetite.  Soluble fiber binds cholesterol to be removed from the blood. Foods high in soluble fiber are dried beans, citrus fruits, oats, apples, bananas, broccoli, Brussels sprouts, and eggplant.  Try to include foods fortified with plant sterols or stanols, such as yogurt, breads, juices, or margarines. Choose several fortified foods to achieve a daily intake of 2 to 3 grams of plant sterols or stanols.  Foods with omega-3 fats can help reduce your risk of heart disease. Aim to have a 3.5 oz portion of fatty fish twice per week, such as salmon, mackerel, albacore tuna, sardines, lake trout, or herring. If you wish to take a fish oil supplement, choose one that contains 1 gram of both DHA and EPA.  Limit processed meats to 2 servings (3 oz portion) weekly.  Limit the sodium in your diet to 1500 milligrams (mg) per day. If you have high blood pressure, talk to a registered dietitian about a DASH (Dietary Approaches to Stop Hypertension) eating plan.  Limit sweets and beverages with added sugar, such as soda, to no more than 5 servings per week. One   serving is:   1 tablespoon sugar.  1 tablespoon jelly or jam.   cup sorbet.  1 cup lemonade.   cup regular soda. CHOOSING FOODS Starches  Allowed: Breads: All kinds (wheat, rye, raisin, white, oatmeal, Italian, French, and English muffin bread). Low-fat rolls: English muffins, frankfurter and hamburger buns, bagels, pita bread, tortillas (not fried). Pancakes, waffles, biscuits, and muffins made with recommended oil.  Avoid: Products made with  saturated or trans fats, oils, or whole milk products. Butter rolls, cheese breads, croissants. Commercial doughnuts, muffins, sweet rolls, biscuits, waffles, pancakes, store-bought mixes. Crackers  Allowed: Low-fat crackers and snacks: Animal, graham, rye, saltine (with recommended oil, no lard), oyster, and matzo crackers. Bread sticks, melba toast, rusks, flatbread, pretzels, and light popcorn.  Avoid: High-fat crackers: cheese crackers, butter crackers, and those made with coconut, palm oil, or trans fat (hydrogenated oils). Buttered popcorn. Cereals  Allowed: Hot or cold whole-grain cereals.  Avoid: Cereals containing coconut, hydrogenated vegetable fat, or animal fat. Potatoes / Pasta / Rice  Allowed: All kinds of potatoes, rice, and pasta (such as macaroni, spaghetti, and noodles).  Avoid: Pasta or rice prepared with cream sauce or high-fat cheese. Chow mein noodles, French fries. Vegetables  Allowed: All vegetables and vegetable juices.  Avoid: Fried vegetables. Vegetables in cream, butter, or high-fat cheese sauces. Limit coconut. Fruit in cream or custard. Protein  Allowed: Limit your intake of meat, seafood, and poultry to no more than 6 oz (cooked weight) per day. All lean, well-trimmed beef, veal, pork, and lamb. All chicken and turkey without skin. All fish and shellfish. Wild game: wild duck, rabbit, pheasant, and venison. Egg whites or low-cholesterol egg substitutes may be used as desired. Meatless dishes: recipes with dried beans, peas, lentils, and tofu (soybean curd). Seeds and nuts: all seeds and most nuts.  Avoid: Prime grade and other heavily marbled and fatty meats, such as short ribs, spare ribs, rib eye roast or steak, frankfurters, sausage, bacon, and high-fat luncheon meats, mutton. Caviar. Commercially fried fish. Domestic duck, goose, venison sausage. Organ meats: liver, gizzard, heart, chitterlings, brains, kidney, sweetbreads. Dairy  Allowed: Low-fat  cheeses: nonfat or low-fat cottage cheese (1% or 2% fat), cheeses made with part skim milk, such as mozzarella, farmers, string, or ricotta. (Cheeses should be labeled no more than 2 to 6 grams fat per oz.). Skim (or 1%) milk: liquid, powdered, or evaporated. Buttermilk made with low-fat milk. Drinks made with skim or low-fat milk or cocoa. Chocolate milk or cocoa made with skim or low-fat (1%) milk. Nonfat or low-fat yogurt.  Avoid: Whole milk cheeses, including colby, cheddar, muenster, Monterey Jack, Havarti, Brie, Camembert, American, Swiss, and blue. Creamed cottage cheese, cream cheese. Whole milk and whole milk products, including buttermilk or yogurt made from whole milk, drinks made from whole milk. Condensed milk, evaporated whole milk, and 2% milk. Soups and Combination Foods  Allowed: Low-fat low-sodium soups: broth, dehydrated soups, homemade broth, soups with the fat removed, homemade cream soups made with skim or low-fat milk. Low-fat spaghetti, lasagna, chili, and Spanish rice if low-fat ingredients and low-fat cooking techniques are used.  Avoid: Cream soups made with whole milk, cream, or high-fat cheese. All other soups. Desserts and Sweets  Allowed: Sherbet, fruit ices, gelatins, meringues, and angel food cake. Homemade desserts with recommended fats, oils, and milk products. Jam, jelly, honey, marmalade, sugars, and syrups. Pure sugar candy, such as gum drops, hard candy, jelly beans, marshmallows, mints, and small amounts of dark chocolate.  Avoid: Commercially prepared   cakes, pies, cookies, frosting, pudding, or mixes for these products. Desserts containing whole milk products, chocolate, coconut, lard, palm oil, or palm kernel oil. Ice cream or ice cream drinks. Candy that contains chocolate, coconut, butter, hydrogenated fat, or unknown ingredients. Buttered syrups. Fats and Oils  Allowed: Vegetable oils: safflower, sunflower, corn, soybean, cottonseed, sesame, canola, olive,  or peanut. Non-hydrogenated margarines. Salad dressing or mayonnaise: homemade or commercial, made with a recommended oil. Low or nonfat salad dressing or mayonnaise.  Limit added fats and oils to 6 to 8 tsp per day (includes fats used in cooking, baking, salads, and spreads on bread). Remember to count the "hidden fats" in foods.  Avoid: Solid fats and shortenings: butter, lard, salt pork, bacon drippings. Gravy containing meat fat, shortening, or suet. Cocoa butter, coconut. Coconut oil, palm oil, palm kernel oil, or hydrogenated oils: these ingredients are often used in bakery products, nondairy creamers, whipped toppings, candy, and commercially fried foods. Read labels carefully. Salad dressings made of unknown oils, sour cream, or cheese, such as blue cheese and Roquefort. Cream, all kinds: half-and-half, light, heavy, or whipping. Sour cream or cream cheese (even if "light" or low-fat). Nondairy cream substitutes: coffee creamers and sour cream substitutes made with palm, palm kernel, hydrogenated oils, or coconut oil. Beverages  Allowed: Coffee (regular or decaffeinated), tea. Diet carbonated beverages, mineral water. Alcohol: Check with your caregiver. Moderation is recommended.  Avoid: Whole milk, regular sodas, and juice drinks with added sugar. Condiments  Allowed: All seasonings and condiments. Cocoa powder. "Cream" sauces made with recommended ingredients.  Avoid: Carob powder made with hydrogenated fats. SAMPLE MENU Breakfast   cup orange juice   cup oatmeal  1 slice toast  1 tsp margarine  1 cup skim milk Lunch  Turkey sandwich with 2 oz turkey, 2 slices bread  Lettuce and tomato slices  Fresh fruit  Carrot sticks  Coffee or tea Snack  Fresh fruit or low-fat crackers Dinner  3 oz lean ground beef  1 baked potato  1 tsp margarine   cup asparagus  Lettuce salad  1 tbs non-creamy dressing   cup peach slices  1 cup skim milk Document Released:  01/11/2008 Document Revised: 10/03/2011 Document Reviewed: 06/03/2013 ExitCare Patient Information 2015 [employer]. This information is not intended to replace advice given to you by your health care provider. Make sure you discuss any questions you have with your health care provider.  

## 2013-11-20 ENCOUNTER — Telehealth: Payer: Self-pay | Admitting: Internal Medicine

## 2013-11-20 NOTE — Telephone Encounter (Signed)
Pt aware of results and also advised that once results are documented and reviewed by the provider, he will then be given a call

## 2013-11-20 NOTE — Telephone Encounter (Signed)
Pt has questions about his labs that he forgot to ask you yesterday. would appreciate a return call.

## 2013-11-20 NOTE — Telephone Encounter (Signed)
Pt aware Testosterone level normal

## 2013-12-12 ENCOUNTER — Ambulatory Visit: Payer: Medicare Other | Admitting: Family

## 2013-12-16 ENCOUNTER — Encounter: Payer: Self-pay | Admitting: Family

## 2013-12-16 ENCOUNTER — Ambulatory Visit (INDEPENDENT_AMBULATORY_CARE_PROVIDER_SITE_OTHER): Payer: Medicare Other | Admitting: Family

## 2013-12-16 VITALS — BP 148/92 | HR 82 | Ht 66.0 in | Wt 217.9 lb

## 2013-12-16 DIAGNOSIS — G4700 Insomnia, unspecified: Secondary | ICD-10-CM

## 2013-12-16 DIAGNOSIS — F411 Generalized anxiety disorder: Secondary | ICD-10-CM

## 2013-12-16 DIAGNOSIS — K219 Gastro-esophageal reflux disease without esophagitis: Secondary | ICD-10-CM

## 2013-12-16 MED ORDER — TRAZODONE HCL 100 MG PO TABS: 100.0000 mg | ORAL_TABLET | Freq: Every evening | ORAL | Status: DC | PRN

## 2013-12-16 MED ORDER — DIAZEPAM 2 MG PO TABS: 2.0000 mg | ORAL_TABLET | Freq: Two times a day (BID) | ORAL | Status: DC | PRN

## 2013-12-16 NOTE — Patient Instructions (Signed)
Generalized Anxiety Disorder Generalized anxiety disorder (GAD) is a mental disorder. It interferes with life functions, including relationships, work, and school. GAD is different from normal anxiety, which everyone experiences at some point in their lives in response to specific life events and activities. Normal anxiety actually helps us prepare for and get through these life events and activities. Normal anxiety goes away after the event or activity is over.  GAD causes anxiety that is not necessarily related to specific events or activities. It also causes excess anxiety in proportion to specific events or activities. The anxiety associated with GAD is also difficult to control. GAD can vary from mild to severe. People with severe GAD can have intense waves of anxiety with physical symptoms (panic attacks).  SYMPTOMS The anxiety and worry associated with GAD are difficult to control. This anxiety and worry are related to many life events and activities and also occur more days than not for 6 months or longer. People with GAD also have three or more of the following symptoms (one or more in children):  Restlessness.   Fatigue.  Difficulty concentrating.   Irritability.  Muscle tension.  Difficulty sleeping or unsatisfying sleep. DIAGNOSIS GAD is diagnosed through an assessment by your health care provider. Your health care provider will ask you questions aboutyour mood,physical symptoms, and events in your life. Your health care provider may ask you about your medical history and use of alcohol or drugs, including prescription medicines. Your health care provider may also do a physical exam and blood tests. Certain medical conditions and the use of certain substances can cause symptoms similar to those associated with GAD. Your health care provider may refer you to a mental health specialist for further evaluation. TREATMENT The following therapies are usually used to treat GAD:    Medication. Antidepressant medication usually is prescribed for long-term daily control. Antianxiety medicines may be added in severe cases, especially when panic attacks occur.   Talk therapy (psychotherapy). Certain types of talk therapy can be helpful in treating GAD by providing support, education, and guidance. A form of talk therapy called cognitive behavioral therapy can teach you healthy ways to think about and react to daily life events and activities.  Stress managementtechniques. These include yoga, meditation, and exercise and can be very helpful when they are practiced regularly. A mental health specialist can help determine which treatment is best for you. Some people see improvement with one therapy. However, other people require a combination of therapies. Document Released: 07/29/2012 Document Revised: 08/18/2013 Document Reviewed: 07/29/2012 ExitCare Patient Information 2015 [employer]. This information is not intended to replace advice given to you by your health care provider. Make sure you discuss any questions you have with your health care provider.  

## 2013-12-16 NOTE — Progress Notes (Signed)
Pre visit review using our clinic review tool, if applicable. No additional management support is needed unless otherwise documented below in the visit note. 

## 2013-12-17 NOTE — Progress Notes (Signed)
Subjective:    Patient ID: Victor Diaz, male    DOB: Sep 12, 1945, 68 y.o.   MRN: [id number]  HPI 68 year old white male, with a history of alcohol abuse is in today requesting to go back on Xanax and Ambien. He is currently on Klonopin and Seroquel to help with anxiety and sleep. Has been on Paxil in the past and reports that that worked well. Does not want to try another SSRI. Weight is up 8 pounds. Continues to have anxiety. No helplessness or hopelessness.    Review of Systems  Constitutional: Negative.   Respiratory: Negative.   Cardiovascular: Negative.   Gastrointestinal: Negative.   Endocrine: Negative.   Genitourinary: Negative.   Musculoskeletal: Negative.   Skin: Negative.   Allergic/Immunologic: Negative.   Neurological: Negative.   Psychiatric/Behavioral: Positive for agitation. The patient is nervous/anxious.    Past Medical History  Diagnosis Date  . Allergy   . Gout   . Hyperlipidemia   . EtOH dependence   . Hemothorax on right   . Lisfranc's dislocation 10/20/2011  . Right fibular fracture 10/20/2011    History   Social History  . Marital Status: Widowed    Spouse Name: N/A    Number of Children: N/A  . Years of Education: N/A   Occupational History  . Not on file.   Social History Main Topics  . Smoking status: Former Smoker -- 1.00 packs/day for 10 years    Quit date: 08/09/1969  . Smokeless tobacco: Never Used  . Alcohol Use: Yes  . Drug Use: No  . Sexual Activity: Not on file   Other Topics Concern  . Not on file   Social History Narrative   Lives in [place],  Has two sons who live with him   He is self employed.   He does not have a valid driver's license because of alcohol-related in fractions      Son drives him ;has MS          Past Surgical History  Procedure Laterality Date  . Appendectomy    . Hernia repair    . Tonsilectomy, adenoidectomy, bilateral myringotomy and tubes    . Revision total hip arthroplasty  5.14.10   . Partial hip arthroplasty  2010    left    Family History  Problem Relation Age of Onset  . Arthritis Mother   . Tuberculosis Father     Allergies  Allergen Reactions  . Atorvastatin Anaphylaxis    facial swelling    Current Outpatient Prescriptions on File Prior to Visit  Medication Sig Dispense Refill  . cetirizine (ZYRTEC) 10 MG tablet TAKE 1 TABLET BY MOUTH ONCE A DAY AS NEEDED.  90 tablet  1  . omeprazole (PRILOSEC) 20 MG capsule Take 20 mg by mouth daily as needed (for acid reflux).      . simvastatin (ZOCOR) 40 MG tablet TAKE (1) TABLET BY MOUTH AT BEDTIME.  30 tablet  5   No current facility-administered medications on file prior to visit.    BP 148/92  Pulse 82  Ht 5\' 6"  (1.676 m)  Wt 217 lb 14.4 oz (98.839 kg)  BMI 35.19 kg/m2chart    Objective:   Physical Exam  Constitutional: He is oriented to person, place, and time. He appears well-developed and well-nourished.  HENT:  Right Ear: External ear normal.  Left Ear: External ear normal.  Nose: Nose normal.  Mouth/Throat: Oropharynx is clear and moist.  Neck: Normal range of  motion. Neck supple.  Cardiovascular: Normal rate, regular rhythm and normal heart sounds.   Pulmonary/Chest: Effort normal and breath sounds normal.  Musculoskeletal: Normal range of motion.  Neurological: He is alert and oriented to person, place, and time.  Skin: Skin is warm and dry.  Psychiatric: He has a normal mood and affect.          Assessment & Plan:  Victor Diaz was seen today for no specified reason.  Diagnoses and associated orders for this visit:  Insomnia  Generalized anxiety disorder  Gastroesophageal reflux disease without esophagitis  Other Orders - traZODone (DESYREL) 100 MG tablet; Take 1 tablet (100 mg total) by mouth at bedtime as needed for sleep. - diazepam (VALIUM) 2 MG tablet; Take 1 tablet (2 mg total) by mouth every 12 (twelve) hours as needed for anxiety.   See ortho about shoulder pain. D/C  Seroquel. If current medication is regimen is not sufficient will need to see psychiatry.

## 2014-01-13 ENCOUNTER — Telehealth: Payer: Self-pay | Admitting: Family

## 2014-01-13 MED ORDER — DIAZEPAM 2 MG PO TABS: 2.0000 mg | ORAL_TABLET | Freq: Two times a day (BID) | ORAL | Status: DC | PRN

## 2014-01-13 NOTE — Telephone Encounter (Signed)
Last filled 12/16/13 for #30 to be taken q 12hrs prn. Ok to fill?

## 2014-01-13 NOTE — Telephone Encounter (Signed)
[place], [place] is requesting re-fill on diazepam (VALIUM) 2 MG tablet

## 2014-01-13 NOTE — Telephone Encounter (Signed)
done

## 2014-01-13 NOTE — Telephone Encounter (Signed)
Ok to fill 

## 2014-02-16 ENCOUNTER — Telehealth: Payer: Self-pay | Admitting: Family

## 2014-02-16 MED ORDER — DIAZEPAM 2 MG PO TABS: 2.0000 mg | ORAL_TABLET | Freq: Two times a day (BID) | ORAL | Status: DC | PRN

## 2014-02-16 NOTE — Telephone Encounter (Signed)
[facility], [place] is requesting re-fill on diazepam (VALIUM) 2 MG tablet

## 2014-02-16 NOTE — Telephone Encounter (Signed)
Done

## 2014-03-18 ENCOUNTER — Telehealth: Payer: Self-pay

## 2014-03-18 MED ORDER — DIAZEPAM 2 MG PO TABS: 2.0000 mg | ORAL_TABLET | Freq: Two times a day (BID) | ORAL | Status: DC | PRN

## 2014-03-18 NOTE — Telephone Encounter (Signed)
Done

## 2014-03-18 NOTE — Telephone Encounter (Signed)
Rx request from [facility] for Diazepam 2 mg tablet- Take 1 tablet by mouth every 12 hours as needed for anxiety. Pls advise.

## 2014-04-20 ENCOUNTER — Other Ambulatory Visit: Payer: Self-pay | Admitting: Family

## 2014-04-21 ENCOUNTER — Telehealth: Payer: Self-pay | Admitting: Family

## 2014-04-21 MED ORDER — DIAZEPAM 2 MG PO TABS: 2.0000 mg | ORAL_TABLET | Freq: Two times a day (BID) | ORAL | Status: DC | PRN

## 2014-04-21 NOTE — Telephone Encounter (Signed)
Done

## 2014-04-21 NOTE — Telephone Encounter (Signed)
Pt request refill of the following: traZODone (DESYREL) 100 MG tablet, diazepam (VALIUM) 2 MG tablet   Phamacy: [facility]

## 2014-05-20 ENCOUNTER — Other Ambulatory Visit: Payer: Self-pay | Admitting: Internal Medicine

## 2014-06-03 ENCOUNTER — Other Ambulatory Visit: Payer: Self-pay | Admitting: Internal Medicine

## 2014-06-18 ENCOUNTER — Other Ambulatory Visit: Payer: Self-pay | Admitting: Family

## 2014-07-23 ENCOUNTER — Telehealth: Payer: Self-pay | Admitting: *Deleted

## 2014-07-23 NOTE — Telephone Encounter (Signed)
Refill denied. Pt needs appointment. Please call to schedule and he needs an appointment to est with a new provider

## 2014-07-23 NOTE — Telephone Encounter (Signed)
lmom for pt to cb

## 2014-07-23 NOTE — Telephone Encounter (Signed)
Received fax from [facility] requesting refill for Diazepam 2 mg tablet.

## 2014-07-24 NOTE — Telephone Encounter (Signed)
lmom for pt to cb

## 2014-07-28 NOTE — Telephone Encounter (Signed)
Pt has been sch

## 2014-07-29 ENCOUNTER — Ambulatory Visit: Payer: Medicare Other | Admitting: Family

## 2014-08-07 ENCOUNTER — Encounter: Payer: Self-pay | Admitting: Adult Health

## 2014-08-07 ENCOUNTER — Ambulatory Visit (INDEPENDENT_AMBULATORY_CARE_PROVIDER_SITE_OTHER): Payer: Medicare Other | Admitting: Adult Health

## 2014-08-07 VITALS — BP 128/80 | Temp 98.2°F | Wt 213.0 lb

## 2014-08-07 DIAGNOSIS — F1010 Alcohol abuse, uncomplicated: Secondary | ICD-10-CM | POA: Diagnosis not present

## 2014-08-07 DIAGNOSIS — Z760 Encounter for issue of repeat prescription: Secondary | ICD-10-CM

## 2014-08-07 DIAGNOSIS — R42 Dizziness and giddiness: Secondary | ICD-10-CM | POA: Diagnosis not present

## 2014-08-07 DIAGNOSIS — R079 Chest pain, unspecified: Secondary | ICD-10-CM | POA: Diagnosis not present

## 2014-08-07 DIAGNOSIS — Z7189 Other specified counseling: Secondary | ICD-10-CM | POA: Diagnosis not present

## 2014-08-07 DIAGNOSIS — Z7689 Persons encountering health services in other specified circumstances: Secondary | ICD-10-CM

## 2014-08-07 LAB — BASIC METABOLIC PANEL
BUN: 11 mg/dL (ref 6–23)
CALCIUM: 9.5 mg/dL (ref 8.4–10.5)
CHLORIDE: 98 meq/L (ref 96–112)
CO2: 24 meq/L (ref 19–32)
Creatinine, Ser: 1 mg/dL (ref 0.40–1.50)
GFR: 78.36 mL/min (ref >60.00)
Glucose, Bld: 92 mg/dL (ref 70–99)
Potassium: 3.9 mEq/L (ref 3.5–5.1)
Sodium: 132 mEq/L — ABNORMAL LOW (ref 135–145)

## 2014-08-07 LAB — CBC
HCT: 42.2 % (ref 39.0–52.0)
Hemoglobin: 14.7 g/dL (ref 13.0–17.0)
MCHC: 34.8 g/dL (ref 30.0–36.0)
MCV: 97.9 fl (ref 78.0–100.0)
Platelets: 184 10*3/uL (ref 150.0–400.0)
RBC: 4.31 Mil/uL (ref 4.22–5.81)
RDW: 13.6 % (ref 11.5–15.5)
WBC: 7.1 10*3/uL (ref 4.0–10.5)

## 2014-08-07 LAB — HEPATIC FUNCTION PANEL
ALBUMIN: 4.1 g/dL (ref 3.5–5.2)
ALK PHOS: 35 U/L — AB (ref 39–117)
ALT: 86 U/L — AB (ref 0–53)
AST: 142 U/L — ABNORMAL HIGH (ref 0–37)
Bilirubin, Direct: 0.1 mg/dL (ref 0.0–0.3)
TOTAL PROTEIN: 7.4 g/dL (ref 6.0–8.3)
Total Bilirubin: 0.4 mg/dL (ref 0.2–1.2)

## 2014-08-07 LAB — URINALYSIS, ROUTINE W REFLEX MICROSCOPIC
BILIRUBIN URINE: NEGATIVE
HGB URINE DIPSTICK: NEGATIVE
KETONES UR: NEGATIVE
Leukocytes, UA: NEGATIVE
NITRITE: NEGATIVE
Specific Gravity, Urine: <=1.005 — AB (ref 1.000–1.030)
Total Protein, Urine: NEGATIVE
Urine Glucose: NEGATIVE
Urobilinogen, UA: 0.2 (ref 0.0–1.0)
pH: 5.5 (ref 5.0–8.0)

## 2014-08-07 LAB — VITAMIN B12: Vitamin B-12: 410 pg/mL (ref 211–911)

## 2014-08-07 MED ORDER — TRAZODONE HCL 100 MG PO TABS: ORAL_TABLET | ORAL | Status: DC

## 2014-08-07 MED ORDER — DIAZEPAM 2 MG PO TABS: 2.0000 mg | ORAL_TABLET | Freq: Two times a day (BID) | ORAL | Status: DC | PRN

## 2014-08-07 NOTE — Progress Notes (Signed)
HPI:  Victor Diaz is here to establish care.  Last PCP and physical: August 5th 2015 with Megan Salon.   He is a pleasant man with the past medical history of ETOH Abuse, Anxiety and Gout. He presented today with many different acute/chronic issues.    Has the following chronic problems that require follow up and concerns today:  ETOH Abuse - His last drink was yesterday. The amount he drinks varies and depends on the day. " Somedays I drink 1-2 beers, somedays I drink a six pack or more, and somedays I don't have a drink." This has been a chronic issue for him. He does not have a drivers license because of a DUI. He did not smell of alcohol in the office. He endorses quitting alcohol in the past and feels as though it is time to cut out alcohol from his life.   Prescription Refill - He would like refills of his Trazadone and would like to go back on Xanax. He does not think the Valium he was prescribed is working for him. He is not sure why he was taken off Xanax and Ambian, but would like to have both of them back.    Chest pain/palpitations - Intermittent, he is unsure of the time frame in which it has been happening. Feels as though " my heart is going to beat out of my chest". Denies any cardiac history. Has never worn a halter monitor.   Dizziness - He has been experiencing dizziness "everyday" for an unknown amount of time. He does not endorse waking up early, rather it happens throughout the day. He states that he is not eating much and does not drink much throughout the day. He feels as though " I am going to pass out". He has not had a syncopal episode.    ROS negative for unless reported above: fevers, unintentional weight loss, hearing or vision loss,  struggling to breath, hemoptysis, melena, hematochezia, hematuria, falls, loc, si, thoughts of self harm  Past Medical History  Diagnosis Date  . Allergy   . Gout   . Hyperlipidemia   . EtOH dependence   . Hemothorax on  right   . Lisfranc's dislocation 10/20/2011  . Right fibular fracture 10/20/2011    Past Surgical History  Procedure Laterality Date  . Appendectomy    . Hernia repair    . Tonsilectomy, adenoidectomy, bilateral myringotomy and tubes    . 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 Partial hip arthroplasty  2010    left    Family History  Problem Relation Age of Onset  . Arthritis Mother   . Tuberculosis Father     History   Social History  . Marital Status: Widowed    Spouse Name: N/A  . Number of Children: N/A  . Years of Education: N/A   Social History Main Topics  . Smoking status: Former Smoker -- 1.00 packs/day for 10 years    Quit date: 08/09/1969  . Smokeless tobacco: Never Used  . Alcohol Use: Yes  . Drug Use: No  . Sexual Activity: Not on file   Other Topics Concern  . None   Social History Narrative   Lives in [place],  Has two sons who live with him   He is no longer working - lost his license.    He does not have a valid driver's license because of alcohol-related in fractions      Son drives him ;has MS  Current outpatient prescriptions:  .  cetirizine (ZYRTEC) 10 MG tablet, TAKE 1 TABLET BY MOUTH ONCE A DAY AS NEEDED., Disp: 90 tablet, Rfl: 0 .  diazepam (VALIUM) 2 MG tablet, Take 1 tablet (2 mg total) by mouth every 12 (twelve) hours as needed for anxiety., Disp: 30 tablet, Rfl: 0 .  omeprazole (PRILOSEC) 20 MG capsule, TAKE 1 CAPSULE BY MOUTH DAILY AS NEEDED FOR HEART BURN., Disp: 90 capsule, Rfl: 1 .  simvastatin (ZOCOR) 40 MG tablet, TAKE (1) TABLET BY MOUTH AT BEDTIME., Disp: 30 tablet, Rfl: 5 .  traZODone (DESYREL) 100 MG tablet, TAKE ONE TABLET BY MOUTH AT BEDTIME AS NEEDED FOR SLEEP., Disp: 30 tablet, Rfl: 1  EXAM:  Filed Vitals:   08/07/14 1411  BP: 128/80  Temp: 98.2 F (36.8 C)    Body mass index is 34.4 kg/(m^2).  GENERAL: vitals reviewed and listed above, alert, oriented, obese, appears tired but in no acute  distress.   HEENT: atraumatic, conjunttiva clear, no obvious abnormalities on inspection of external nose and ears  NECK: no obvious masses on inspection  LUNGS: clear to auscultation bilaterally, no wheezes, rales or rhonchi, good air movement  CV: HRRR, no peripheral edema, no carotid bruit  MS: moves all extremities without noticeable abnormality. He was able to climb from chair to exam table on his own. He did becomes slightly off balance when getting onto table but was able to recover himself.   Neuro: Cranial nerves intact. No nystagmus with horizontal gaze test nor with tilt table test.   PSYCH: pleasant and cooperative,but pleads for xanax and ambien. He has a flat, sad affect.  No obvious anxiety  ASSESSMENT AND PLAN:  Discussed the following assessment and plan:  1. Intermittent chest pain - Holter monitor - 72 hour; Future - Basic metabolic panel - CBC - EKG 12-Lead - Urinalysis, Routine w reflex microscopic - Will go to the ER with any additional chest pain, especially that radiates up jaw line, down left arm, or to his back/shoulder blade.  -- Will inform patient of lab results.   2. Encounter to establish care - Follow up in one month or sooner if needed  3. Chronic alcohol abuse - Advised to stop drinking - He does not want information on rehab at this time.  - Will consider therapy. Pamphlet given to patient on behavioral health services offered at [facility].  - Basic metabolic panel - CBC - Hepatic function panel - Urinalysis, Routine w reflex microscopic - Vitamin B12 -- Will inform patient of lab results.  4. Dizziness - Basic metabolic panel - CBC - EKG 12-Lead - Urinalysis, Routine w reflex microscopic - Will inform patient of lab results.   5. Medication refill - Spoke to him about how he was told in the past by Dr. Leanne Chang and Spero Geralds that they were not going to write prescriptions for Xanax and Ambien anymore. He was unable to grasp  this concept. I informed him that I agreed with their plan of care and that with his current state of health, his alcohol abuse and his age, I was not going to write for Xanax or Ambien either. I also informed him that I was not going to go up on the strength, especially if he was experiencing dizziness and his fall that sent him to the ER within the last year. As I mentioned this fall, the patient states " I don't even remember the fall or being in the ER, but I guess if  it is in the chart then it is true".  I would provide him for one more script of Valium 2 mg and then I was going to taper him down on that medication as well. He was not happy and stated that the Valium was not working, but later said he would continue to try and valium. Prescription for Valium 2mg  PRN given  - Trazadone 100mg  reordered.  - Advised to try melatonin as well for sleep issues.  - Follow up in one month for additional refills.  - Follow up sooner as needed.    -We reviewed the PMH, PSH, FH, SH, Meds and Allergies. -We provided refills for any medications we will prescribe as needed. -We addressed current concerns per orders and patient instructions. -We have advised patient to follow up per instructions below.   -Patient advised to return or notify a provider immediately if symptoms worsen or persist or new concerns arise.  Patient Instructions  It was a pleasure meeting you, and I would like to welcome you to the team. I will get some basic labs on you today and a holter monitor was ordered for you to wear three days. In the mean time I would like you to quit drinking, this will be the single biggest thing you can do to improve your health. Stay well hydrated with water and gatorade and eat healthy foods. I will let you know what your labs show, once I get them. I would like you to follow up with me in a month or if you need me sooner. Alcohol Use Disorder Alcohol use disorder is a mental disorder. It is not a  one-time incident of heavy drinking. Alcohol use disorder is the excessive and uncontrollable use of alcohol over time that leads to problems with functioning in one or more areas of daily living. People with this disorder risk harming themselves and others when they drink to excess. Alcohol use disorder also can cause other mental disorders, such as mood and anxiety disorders, and serious physical problems. People with alcohol use disorder often misuse other drugs.  Alcohol use disorder is common and widespread. Some people with this disorder drink alcohol to cope with or escape from negative life events. Others drink to relieve chronic pain or symptoms of mental illness. People with a family history of alcohol use disorder are at higher risk of losing control and using alcohol to excess.  SYMPTOMS  Signs and symptoms of alcohol use disorder may include the following:   Consumption ofalcohol inlarger amounts or over a longer period of time than intended.  Multiple unsuccessful attempts to cutdown or control alcohol use.   A great deal of time spent obtaining alcohol, using alcohol, or recovering from the effects of alcohol (hangover).  A strong desire or urge to use alcohol (cravings).   Continued use of alcohol despite problems at work, school, or home because of alcohol use.   Continued use of alcohol despite problems in relationships because of alcohol use.  Continued use of alcohol in situations when it is physically hazardous, such as driving a car.  Continued use of alcohol despite awareness of a physical or psychological problem that is likely related to alcohol use. Physical problems related to alcohol use can involve the brain, heart, liver, stomach, and intestines. Psychological problems related to alcohol use include intoxication, depression, anxiety, psychosis, delirium, and dementia.   The need for increased amounts of alcohol to achieve the same desired effect, or a  decreased effect from  the consumption of the same amount of alcohol (tolerance).  Withdrawal symptoms upon reducing or stopping alcohol use, or alcohol use to reduce or avoid withdrawal symptoms. Withdrawal symptoms include:  Racing heart.  Hand tremor.  Difficulty sleeping.  Nausea.  Vomiting.  Hallucinations.  Restlessness.  Seizures. DIAGNOSIS Alcohol use disorder is diagnosed through an assessment by your health care provider. Your health care provider may start by asking three or four questions to screen for excessive or problematic alcohol use. To confirm a diagnosis of alcohol use disorder, at least two symptoms must be present within a 68-month period. The severity of alcohol use disorder depends on the number of symptoms:  Mild--two or three.  Moderate--four or five.  Severe--six or more. Your health care provider may perform a physical exam or use results from lab tests to see if you have physical problems resulting from alcohol use. Your health care provider may refer you to a mental health professional for evaluation. TREATMENT  Some people with alcohol use disorder are able to reduce their alcohol use to low-risk levels. Some people with alcohol use disorder need to quit drinking alcohol. When necessary, mental health professionals with specialized training in substance use treatment can help. Your health care provider can help you decide how severe your alcohol use disorder is and what type of treatment you need. The following forms of treatment are available:   Detoxification. Detoxification involves the use of prescription medicines to prevent alcohol withdrawal symptoms in the first week after quitting. This is important for people with a history of symptoms of withdrawal and for heavy drinkers who are likely to have withdrawal symptoms. Alcohol withdrawal can be dangerous and, in severe cases, cause death. Detoxification is usually provided in a hospital or in-patient  substance use treatment facility.  Counseling or talk therapy. Talk therapy is provided by substance use treatment counselors. It addresses the reasons people use alcohol and ways to keep them from drinking again. The goals of talk therapy are to help people with alcohol use disorder find healthy activities and ways to cope with life stress, to identify and avoid triggers for alcohol use, and to handle cravings, which can cause relapse.  Medicines.Different medicines can help treat alcohol use disorder through the following actions:  Decrease alcohol cravings.  Decrease the positive reward response felt from alcohol use.  Produce an uncomfortable physical reaction when alcohol is used (aversion therapy).  Support groups. Support groups are run by people who have quit drinking. They provide emotional support, advice, and guidance. These forms of treatment are often combined. Some people with alcohol use disorder benefit from intensive combination treatment provided by specialized substance use treatment centers. Both inpatient and outpatient treatment programs are available. Document Released: 05/11/2004 Document Revised: 08/18/2013 Document Reviewed: 07/11/2012 [facility] Patient Information 2015 [place], [place]. This information is not intended to replace advice given to you by your health care provider. Make sure you discuss any questions you have with your health care provider. Alcohol Use Disorder Alcohol use disorder is a mental disorder. It is not a one-time incident of heavy drinking. Alcohol use disorder is the excessive and uncontrollable use of alcohol over time that leads to problems with functioning in one or more areas of daily living. People with this disorder risk harming themselves and others when they drink to excess. Alcohol use disorder also can cause other mental disorders, such as mood and anxiety disorders, and serious physical problems. People with alcohol use disorder often  misuse other drugs.  Alcohol use disorder is common and widespread. Some people with this disorder drink alcohol to cope with or escape from negative life events. Others drink to relieve chronic pain or symptoms of mental illness. People with a family history of alcohol use disorder are at higher risk of losing control and using alcohol to excess.  SYMPTOMS  Signs and symptoms of alcohol use disorder may include the following:   Consumption ofalcohol inlarger amounts or over a longer period of time than intended.  Multiple unsuccessful attempts to cutdown or control alcohol use.   A great deal of time spent obtaining alcohol, using alcohol, or recovering from the effects of alcohol (hangover).  A strong desire or urge to use alcohol (cravings).   Continued use of alcohol despite problems at work, school, or home because of alcohol use.   Continued use of alcohol despite problems in relationships because of alcohol use.  Continued use of alcohol in situations when it is physically hazardous, such as driving a car.  Continued use of alcohol despite awareness of a physical or psychological problem that is likely related to alcohol use. Physical problems related to alcohol use can involve the brain, heart, liver, stomach, and intestines. Psychological problems related to alcohol use include intoxication, depression, anxiety, psychosis, delirium, and dementia.   The need for increased amounts of alcohol to achieve the same desired effect, or a decreased effect from the consumption of the same amount of alcohol (tolerance).  Withdrawal symptoms upon reducing or stopping alcohol use, or alcohol use to reduce or avoid withdrawal symptoms. Withdrawal symptoms include:  Racing heart.  Hand tremor.  Difficulty sleeping.  Nausea.  Vomiting.  Hallucinations.  Restlessness.  Seizures. DIAGNOSIS Alcohol use disorder is diagnosed through an assessment by your health care provider.  Your health care provider may start by asking three or four questions to screen for excessive or problematic alcohol use. To confirm a diagnosis of alcohol use disorder, at least two symptoms must be present within a 64-month period. The severity of alcohol use disorder depends on the number of symptoms:  Mild--two or three.  Moderate--four or five.  Severe--six or more. Your health care provider may perform a physical exam or use results from lab tests to see if you have physical problems resulting from alcohol use. Your health care provider may refer you to a mental health professional for evaluation. TREATMENT  Some people with alcohol use disorder are able to reduce their alcohol use to low-risk levels. Some people with alcohol use disorder need to quit drinking alcohol. When necessary, mental health professionals with specialized training in substance use treatment can help. Your health care provider can help you decide how severe your alcohol use disorder is and what type of treatment you need. The following forms of treatment are available:   Detoxification. Detoxification involves the use of prescription medicines to prevent alcohol withdrawal symptoms in the first week after quitting. This is important for people with a history of symptoms of withdrawal and for heavy drinkers who are likely to have withdrawal symptoms. Alcohol withdrawal can be dangerous and, in severe cases, cause death. Detoxification is usually provided in a hospital or in-patient substance use treatment facility.  Counseling or talk therapy. Talk therapy is provided by substance use treatment counselors. It addresses the reasons people use alcohol and ways to keep them from drinking again. The goals of talk therapy are to help people with alcohol use disorder find healthy activities and ways to cope with life stress,  to identify and avoid triggers for alcohol use, and to handle cravings, which can cause  relapse.  Medicines.Different medicines can help treat alcohol use disorder through the following actions:  Decrease alcohol cravings.  Decrease the positive reward response felt from alcohol use.  Produce an uncomfortable physical reaction when alcohol is used (aversion therapy).  Support groups. Support groups are run by people who have quit drinking. They provide emotional support, advice, and guidance. These forms of treatment are often combined. Some people with alcohol use disorder benefit from intensive combination treatment provided by specialized substance use treatment centers. Both inpatient and outpatient treatment programs are available. Document Released: 05/11/2004 Document Revised: 08/18/2013 Document Reviewed: 07/11/2012 [facility] Patient Information 2015 [place], [place]. This information is not intended to replace advice given to you by your health care provider. Make sure you discuss any questions you have with your health care provider.      Dorothyann Peng, AGNP

## 2014-08-07 NOTE — Progress Notes (Signed)
Pre visit review using our clinic review tool, if applicable. No additional management support is needed unless otherwise documented below in the visit note. 

## 2014-08-07 NOTE — Patient Instructions (Addendum)
It was a pleasure meeting you, and I would like to welcome you to the team. I will get some basic labs on you today and a holter monitor was ordered for you to wear three days. In the mean time I would like you to quit drinking, this will be the single biggest thing you can do to improve your health. Stay well hydrated with water and gatorade and eat healthy foods. I will let you know what your labs show, once I get them. I would like you to follow up with me in a month or if you need me sooner. Alcohol Use Disorder Alcohol use disorder is a mental disorder. It is not a one-time incident of heavy drinking. Alcohol use disorder is the excessive and uncontrollable use of alcohol over time that leads to problems with functioning in one or more areas of daily living. People with this disorder risk harming themselves and others when they drink to excess. Alcohol use disorder also can cause other mental disorders, such as mood and anxiety disorders, and serious physical problems. People with alcohol use disorder often misuse other drugs.  Alcohol use disorder is common and widespread. Some people with this disorder drink alcohol to cope with or escape from negative life events. Others drink to relieve chronic pain or symptoms of mental illness. People with a family history of alcohol use disorder are at higher risk of losing control and using alcohol to excess.  SYMPTOMS  Signs and symptoms of alcohol use disorder may include the following:   Consumption ofalcohol inlarger amounts or over a longer period of time than intended.  Multiple unsuccessful attempts to cutdown or control alcohol use.   A great deal of time spent obtaining alcohol, using alcohol, or recovering from the effects of alcohol (hangover).  A strong desire or urge to use alcohol (cravings).   Continued use of alcohol despite problems at work, school, or home because of alcohol use.   Continued use of alcohol despite problems in  relationships because of alcohol use.  Continued use of alcohol in situations when it is physically hazardous, such as driving a car.  Continued use of alcohol despite awareness of a physical or psychological problem that is likely related to alcohol use. Physical problems related to alcohol use can involve the brain, heart, liver, stomach, and intestines. Psychological problems related to alcohol use include intoxication, depression, anxiety, psychosis, delirium, and dementia.   The need for increased amounts of alcohol to achieve the same desired effect, or a decreased effect from the consumption of the same amount of alcohol (tolerance).  Withdrawal symptoms upon reducing or stopping alcohol use, or alcohol use to reduce or avoid withdrawal symptoms. Withdrawal symptoms include:  Racing heart.  Hand tremor.  Difficulty sleeping.  Nausea.  Vomiting.  Hallucinations.  Restlessness.  Seizures. DIAGNOSIS Alcohol use disorder is diagnosed through an assessment by your health care provider. Your health care provider may start by asking three or four questions to screen for excessive or problematic alcohol use. To confirm a diagnosis of alcohol use disorder, at least two symptoms must be present within a 79-month period. The severity of alcohol use disorder depends on the number of symptoms:  Mild--two or three.  Moderate--four or five.  Severe--six or more. Your health care provider may perform a physical exam or use results from lab tests to see if you have physical problems resulting from alcohol use. Your health care provider may refer you to a mental health professional for  evaluation. TREATMENT  Some people with alcohol use disorder are able to reduce their alcohol use to low-risk levels. Some people with alcohol use disorder need to quit drinking alcohol. When necessary, mental health professionals with specialized training in substance use treatment can help. Your health care  provider can help you decide how severe your alcohol use disorder is and what type of treatment you need. The following forms of treatment are available:   Detoxification. Detoxification involves the use of prescription medicines to prevent alcohol withdrawal symptoms in the first week after quitting. This is important for people with a history of symptoms of withdrawal and for heavy drinkers who are likely to have withdrawal symptoms. Alcohol withdrawal can be dangerous and, in severe cases, cause death. Detoxification is usually provided in a hospital or in-patient substance use treatment facility.  Counseling or talk therapy. Talk therapy is provided by substance use treatment counselors. It addresses the reasons people use alcohol and ways to keep them from drinking again. The goals of talk therapy are to help people with alcohol use disorder find healthy activities and ways to cope with life stress, to identify and avoid triggers for alcohol use, and to handle cravings, which can cause relapse.  Medicines.Different medicines can help treat alcohol use disorder through the following actions:  Decrease alcohol cravings.  Decrease the positive reward response felt from alcohol use.  Produce an uncomfortable physical reaction when alcohol is used (aversion therapy).  Support groups. Support groups are run by people who have quit drinking. They provide emotional support, advice, and guidance. These forms of treatment are often combined. Some people with alcohol use disorder benefit from intensive combination treatment provided by specialized substance use treatment centers. Both inpatient and outpatient treatment programs are available. Document Released: 05/11/2004 Document Revised: 08/18/2013 Document Reviewed: 07/11/2012 [facility] Patient Information 2015 [place], [place]. This information is not intended to replace advice given to you by your health care provider. Make sure you discuss any  questions you have with your health care provider. Alcohol Use Disorder Alcohol use disorder is a mental disorder. It is not a one-time incident of heavy drinking. Alcohol use disorder is the excessive and uncontrollable use of alcohol over time that leads to problems with functioning in one or more areas of daily living. People with this disorder risk harming themselves and others when they drink to excess. Alcohol use disorder also can cause other mental disorders, such as mood and anxiety disorders, and serious physical problems. People with alcohol use disorder often misuse other drugs.  Alcohol use disorder is common and widespread. Some people with this disorder drink alcohol to cope with or escape from negative life events. Others drink to relieve chronic pain or symptoms of mental illness. People with a family history of alcohol use disorder are at higher risk of losing control and using alcohol to excess.  SYMPTOMS  Signs and symptoms of alcohol use disorder may include the following:   Consumption ofalcohol inlarger amounts or over a longer period of time than intended.  Multiple unsuccessful attempts to cutdown or control alcohol use.   A great deal of time spent obtaining alcohol, using alcohol, or recovering from the effects of alcohol (hangover).  A strong desire or urge to use alcohol (cravings).   Continued use of alcohol despite problems at work, school, or home because of alcohol use.   Continued use of alcohol despite problems in relationships because of alcohol use.  Continued use of alcohol in situations when it  is physically hazardous, such as driving a car.  Continued use of alcohol despite awareness of a physical or psychological problem that is likely related to alcohol use. Physical problems related to alcohol use can involve the brain, heart, liver, stomach, and intestines. Psychological problems related to alcohol use include intoxication, depression, anxiety,  psychosis, delirium, and dementia.   The need for increased amounts of alcohol to achieve the same desired effect, or a decreased effect from the consumption of the same amount of alcohol (tolerance).  Withdrawal symptoms upon reducing or stopping alcohol use, or alcohol use to reduce or avoid withdrawal symptoms. Withdrawal symptoms include:  Racing heart.  Hand tremor.  Difficulty sleeping.  Nausea.  Vomiting.  Hallucinations.  Restlessness.  Seizures. DIAGNOSIS Alcohol use disorder is diagnosed through an assessment by your health care provider. Your health care provider may start by asking three or four questions to screen for excessive or problematic alcohol use. To confirm a diagnosis of alcohol use disorder, at least two symptoms must be present within a 47-month period. The severity of alcohol use disorder depends on the number of symptoms:  Mild--two or three.  Moderate--four or five.  Severe--six or more. Your health care provider may perform a physical exam or use results from lab tests to see if you have physical problems resulting from alcohol use. Your health care provider may refer you to a mental health professional for evaluation. TREATMENT  Some people with alcohol use disorder are able to reduce their alcohol use to low-risk levels. Some people with alcohol use disorder need to quit drinking alcohol. When necessary, mental health professionals with specialized training in substance use treatment can help. Your health care provider can help you decide how severe your alcohol use disorder is and what type of treatment you need. The following forms of treatment are available:   Detoxification. Detoxification involves the use of prescription medicines to prevent alcohol withdrawal symptoms in the first week after quitting. This is important for people with a history of symptoms of withdrawal and for heavy drinkers who are likely to have withdrawal symptoms. Alcohol  withdrawal can be dangerous and, in severe cases, cause death. Detoxification is usually provided in a hospital or in-patient substance use treatment facility.  Counseling or talk therapy. Talk therapy is provided by substance use treatment counselors. It addresses the reasons people use alcohol and ways to keep them from drinking again. The goals of talk therapy are to help people with alcohol use disorder find healthy activities and ways to cope with life stress, to identify and avoid triggers for alcohol use, and to handle cravings, which can cause relapse.  Medicines.Different medicines can help treat alcohol use disorder through the following actions:  Decrease alcohol cravings.  Decrease the positive reward response felt from alcohol use.  Produce an uncomfortable physical reaction when alcohol is used (aversion therapy).  Support groups. Support groups are run by people who have quit drinking. They provide emotional support, advice, and guidance. These forms of treatment are often combined. Some people with alcohol use disorder benefit from intensive combination treatment provided by specialized substance use treatment centers. Both inpatient and outpatient treatment programs are available. Document Released: 05/11/2004 Document Revised: 08/18/2013 Document Reviewed: 07/11/2012 [facility] Patient Information 2015 [place], [place]. This information is not intended to replace advice given to you by your health care provider. Make sure you discuss any questions you have with your health care provider.

## 2014-08-10 ENCOUNTER — Other Ambulatory Visit: Payer: Self-pay | Admitting: Adult Health

## 2014-08-10 ENCOUNTER — Telehealth: Payer: Self-pay | Admitting: Adult Health

## 2014-08-10 DIAGNOSIS — R748 Abnormal levels of other serum enzymes: Secondary | ICD-10-CM

## 2014-08-10 NOTE — Telephone Encounter (Signed)
Patient would like a callback on his lab results.

## 2014-08-10 NOTE — Telephone Encounter (Signed)
Left a message for return call; See result note.

## 2014-08-11 NOTE — Telephone Encounter (Signed)
Left a message for return call.  

## 2014-08-12 ENCOUNTER — Other Ambulatory Visit: Payer: Self-pay | Admitting: Adult Health

## 2014-08-12 DIAGNOSIS — R748 Abnormal levels of other serum enzymes: Secondary | ICD-10-CM

## 2014-08-12 NOTE — Telephone Encounter (Signed)
Spoke with pt and pt is aware.  See result note.

## 2014-08-22 ENCOUNTER — Other Ambulatory Visit: Payer: Self-pay | Admitting: Internal Medicine

## 2014-08-27 ENCOUNTER — Other Ambulatory Visit: Payer: Self-pay | Admitting: Adult Health

## 2014-08-27 ENCOUNTER — Encounter: Payer: Self-pay | Admitting: *Deleted

## 2014-08-27 ENCOUNTER — Encounter (INDEPENDENT_AMBULATORY_CARE_PROVIDER_SITE_OTHER): Payer: Medicare Other

## 2014-08-27 DIAGNOSIS — I499 Cardiac arrhythmia, unspecified: Secondary | ICD-10-CM

## 2014-08-27 DIAGNOSIS — R42 Dizziness and giddiness: Secondary | ICD-10-CM | POA: Diagnosis not present

## 2014-08-27 DIAGNOSIS — R079 Chest pain, unspecified: Secondary | ICD-10-CM

## 2014-08-27 NOTE — Progress Notes (Signed)
Patient ID: Victor Diaz, male   DOB: 1945/05/24, 69 y.o.   MRN: [id number] [employer] 48 hour holter monitor applied to patient.

## 2014-08-31 ENCOUNTER — Other Ambulatory Visit: Payer: Self-pay | Admitting: Family

## 2014-09-09 ENCOUNTER — Telehealth: Payer: Self-pay | Admitting: Adult Health

## 2014-09-09 NOTE — Telephone Encounter (Signed)
Patient would like to callback to know the results to his heart monitor test.

## 2014-09-10 NOTE — Telephone Encounter (Signed)
Attempted to call pt; left a message for return call; see result note.

## 2014-09-11 ENCOUNTER — Other Ambulatory Visit: Payer: Self-pay | Admitting: *Deleted

## 2014-09-11 MED ORDER — DIAZEPAM 2 MG PO TABS: ORAL_TABLET | ORAL | Status: DC

## 2014-09-11 NOTE — Telephone Encounter (Signed)
Per Tommi Rumps ok to call in Diazepam 1 mg tablet-Take 1 tablet prn #30 with 0 rf.  Rx called in to pharmacy.

## 2014-10-14 ENCOUNTER — Emergency Department (HOSPITAL_COMMUNITY): Payer: Medicare Other

## 2014-10-14 ENCOUNTER — Encounter (HOSPITAL_COMMUNITY): Payer: Self-pay | Admitting: Emergency Medicine

## 2014-10-14 ENCOUNTER — Inpatient Hospital Stay (HOSPITAL_COMMUNITY)
Admission: EM | Admit: 2014-10-14 | Discharge: 2014-10-18 | DRG: 641 | Disposition: A | Discharge status: Home / Self Care | Payer: Medicare Other | Attending: Internal Medicine | Admitting: Internal Medicine

## 2014-10-14 DIAGNOSIS — R55 Syncope and collapse: Secondary | ICD-10-CM | POA: Diagnosis present

## 2014-10-14 DIAGNOSIS — I6523 Occlusion and stenosis of bilateral carotid arteries: Secondary | ICD-10-CM | POA: Diagnosis not present

## 2014-10-14 DIAGNOSIS — Y903 Blood alcohol level of 60-79 mg/100 ml: Secondary | ICD-10-CM | POA: Diagnosis present

## 2014-10-14 DIAGNOSIS — E860 Dehydration: Secondary | ICD-10-CM | POA: Diagnosis present

## 2014-10-14 DIAGNOSIS — R7989 Other specified abnormal findings of blood chemistry: Secondary | ICD-10-CM | POA: Diagnosis present

## 2014-10-14 DIAGNOSIS — E46 Unspecified protein-calorie malnutrition: Secondary | ICD-10-CM | POA: Diagnosis present

## 2014-10-14 DIAGNOSIS — E872 Acidosis: Secondary | ICD-10-CM | POA: Diagnosis present

## 2014-10-14 DIAGNOSIS — R945 Abnormal results of liver function studies: Secondary | ICD-10-CM

## 2014-10-14 DIAGNOSIS — Z96642 Presence of left artificial hip joint: Secondary | ICD-10-CM | POA: Diagnosis present

## 2014-10-14 DIAGNOSIS — R0989 Other specified symptoms and signs involving the circulatory and respiratory systems: Secondary | ICD-10-CM

## 2014-10-14 DIAGNOSIS — R079 Chest pain, unspecified: Secondary | ICD-10-CM | POA: Diagnosis present

## 2014-10-14 DIAGNOSIS — D696 Thrombocytopenia, unspecified: Secondary | ICD-10-CM | POA: Diagnosis present

## 2014-10-14 DIAGNOSIS — W1811XA Fall from or off toilet without subsequent striking against object, initial encounter: Secondary | ICD-10-CM | POA: Diagnosis present

## 2014-10-14 DIAGNOSIS — E780 Pure hypercholesterolemia: Secondary | ICD-10-CM | POA: Diagnosis present

## 2014-10-14 DIAGNOSIS — G458 Other transient cerebral ischemic attacks and related syndromes: Secondary | ICD-10-CM | POA: Diagnosis present

## 2014-10-14 DIAGNOSIS — E871 Hypo-osmolality and hyponatremia: Secondary | ICD-10-CM | POA: Diagnosis present

## 2014-10-14 DIAGNOSIS — K219 Gastro-esophageal reflux disease without esophagitis: Secondary | ICD-10-CM | POA: Diagnosis present

## 2014-10-14 DIAGNOSIS — Y92002 Bathroom of unspecified non-institutional (private) residence single-family (private) house as the place of occurrence of the external cause: Secondary | ICD-10-CM | POA: Diagnosis not present

## 2014-10-14 DIAGNOSIS — S0181XA Laceration without foreign body of other part of head, initial encounter: Secondary | ICD-10-CM | POA: Diagnosis present

## 2014-10-14 DIAGNOSIS — I471 Supraventricular tachycardia: Secondary | ICD-10-CM | POA: Diagnosis not present

## 2014-10-14 DIAGNOSIS — F1010 Alcohol abuse, uncomplicated: Secondary | ICD-10-CM | POA: Diagnosis present

## 2014-10-14 DIAGNOSIS — Z6834 Body mass index (BMI) 34.0-34.9, adult: Secondary | ICD-10-CM | POA: Diagnosis not present

## 2014-10-14 DIAGNOSIS — I4892 Unspecified atrial flutter: Secondary | ICD-10-CM | POA: Diagnosis present

## 2014-10-14 DIAGNOSIS — E785 Hyperlipidemia, unspecified: Secondary | ICD-10-CM | POA: Diagnosis present

## 2014-10-14 DIAGNOSIS — I483 Typical atrial flutter: Secondary | ICD-10-CM | POA: Diagnosis not present

## 2014-10-14 DIAGNOSIS — Z87891 Personal history of nicotine dependence: Secondary | ICD-10-CM

## 2014-10-14 LAB — MAGNESIUM: MAGNESIUM: 1.9 mg/dL (ref 1.7–2.4)

## 2014-10-14 LAB — BASIC METABOLIC PANEL
Anion gap: 16 — ABNORMAL HIGH (ref 5–15)
BUN: 8 mg/dL (ref 6–20)
CHLORIDE: 97 mmol/L — AB (ref 101–111)
CO2: 16 mmol/L — ABNORMAL LOW (ref 22–32)
Calcium: 8.6 mg/dL — ABNORMAL LOW (ref 8.9–10.3)
Creatinine, Ser: 1.04 mg/dL (ref 0.61–1.24)
GFR calc Af Amer: >60 mL/min (ref >60)
GLUCOSE: 108 mg/dL — AB (ref 65–99)
Potassium: 4.1 mmol/L (ref 3.5–5.1)
Sodium: 129 mmol/L — ABNORMAL LOW (ref 135–145)

## 2014-10-14 LAB — TSH: TSH: 3.418 u[IU]/mL (ref 0.350–4.500)

## 2014-10-14 LAB — RAPID URINE DRUG SCREEN, HOSP PERFORMED
Amphetamines: NOT DETECTED
BARBITURATES: NOT DETECTED
BENZODIAZEPINES: POSITIVE — AB
Cocaine: NOT DETECTED
Opiates: NOT DETECTED
Tetrahydrocannabinol: NOT DETECTED

## 2014-10-14 LAB — CBC WITH DIFFERENTIAL/PLATELET
BASOS PCT: 1 % (ref 0–1)
Basophils Absolute: 0.1 10*3/uL (ref 0.0–0.1)
Eosinophils Absolute: 0.1 10*3/uL (ref 0.0–0.7)
Eosinophils Relative: 2 % (ref 0–5)
HCT: 43.8 % (ref 39.0–52.0)
Hemoglobin: 15.3 g/dL (ref 13.0–17.0)
LYMPHS ABS: 1.9 10*3/uL (ref 0.7–4.0)
LYMPHS PCT: 26 % (ref 12–46)
MCH: 34.2 pg — ABNORMAL HIGH (ref 26.0–34.0)
MCHC: 34.9 g/dL (ref 30.0–36.0)
MCV: 97.8 fL (ref 78.0–100.0)
MONOS PCT: 9 % (ref 3–12)
Monocytes Absolute: 0.6 10*3/uL (ref 0.1–1.0)
NEUTROS ABS: 4.6 10*3/uL (ref 1.7–7.7)
Neutrophils Relative %: 62 % (ref 43–77)
Platelets: 169 10*3/uL (ref 150–400)
RBC: 4.48 MIL/uL (ref 4.22–5.81)
RDW: 12.8 % (ref 11.5–15.5)
WBC: 7.3 10*3/uL (ref 4.0–10.5)

## 2014-10-14 LAB — TROPONIN I: Troponin I: <0.03 ng/mL (ref <0.031)

## 2014-10-14 LAB — HEPATIC FUNCTION PANEL
ALBUMIN: 4 g/dL (ref 3.5–5.0)
ALK PHOS: 40 U/L (ref 38–126)
ALT: 98 U/L — ABNORMAL HIGH (ref 17–63)
AST: 140 U/L — ABNORMAL HIGH (ref 15–41)
Bilirubin, Direct: 0.2 mg/dL (ref 0.1–0.5)
Indirect Bilirubin: 0.5 mg/dL (ref 0.3–0.9)
TOTAL PROTEIN: 7.3 g/dL (ref 6.5–8.1)
Total Bilirubin: 0.7 mg/dL (ref 0.3–1.2)

## 2014-10-14 LAB — LACTIC ACID, PLASMA
Lactic Acid, Venous: 3.6 mmol/L (ref 0.5–2.0)
Lactic Acid, Venous: 2.8 mmol/L (ref 0.5–2.0)

## 2014-10-14 LAB — ETHANOL: Alcohol, Ethyl (B): 75 mg/dL — ABNORMAL HIGH (ref <5)

## 2014-10-14 LAB — CBG MONITORING, ED: GLUCOSE-CAPILLARY: 98 mg/dL (ref 65–99)

## 2014-10-14 MED ORDER — LIDOCAINE HCL (PF) 1 % IJ SOLN
5.0000 mL | Freq: Once | INTRAMUSCULAR | Status: DC
  Filled 2014-10-14: qty 5

## 2014-10-14 MED ORDER — LORAZEPAM 2 MG/ML IJ SOLN: 1.0000 mg | Freq: Four times a day (QID) | INTRAMUSCULAR | Status: AC | PRN

## 2014-10-14 MED ORDER — VITAMIN B-1 100 MG PO TABS
100.0000 mg | ORAL_TABLET | Freq: Every day | ORAL | Status: DC
  Administered 2014-10-14 – 2014-10-18 (×5): 100 mg via ORAL
  Filled 2014-10-14 (×5): qty 1

## 2014-10-14 MED ORDER — ACETAMINOPHEN 650 MG RE SUPP: 650.0000 mg | Freq: Four times a day (QID) | RECTAL | Status: DC | PRN

## 2014-10-14 MED ORDER — SIMVASTATIN 20 MG PO TABS
40.0000 mg | ORAL_TABLET | Freq: Every day | ORAL | Status: DC
  Administered 2014-10-14: 40 mg via ORAL
  Filled 2014-10-14: qty 2

## 2014-10-14 MED ORDER — ONDANSETRON HCL 4 MG PO TABS: 4.0000 mg | ORAL_TABLET | Freq: Four times a day (QID) | ORAL | Status: DC | PRN

## 2014-10-14 MED ORDER — THIAMINE HCL 100 MG/ML IJ SOLN: 100.0000 mg | Freq: Every day | INTRAMUSCULAR | Status: DC

## 2014-10-14 MED ORDER — SODIUM CHLORIDE 0.9 % IJ SOLN
3.0000 mL | Freq: Two times a day (BID) | INTRAMUSCULAR | Status: DC
  Administered 2014-10-14 – 2014-10-18 (×6): 3 mL via INTRAVENOUS

## 2014-10-14 MED ORDER — GI COCKTAIL ~~LOC~~
30.0000 mL | Freq: Three times a day (TID) | ORAL | Status: DC | PRN
  Filled 2014-10-14: qty 30

## 2014-10-14 MED ORDER — BACITRACIN ZINC 500 UNIT/GM EX OINT
TOPICAL_OINTMENT | Freq: Two times a day (BID) | CUTANEOUS | Status: DC
  Administered 2014-10-15 – 2014-10-18 (×7): 1 via TOPICAL
  Filled 2014-10-14: qty 28.35
  Filled 2014-10-14: qty 0.9
  Filled 2014-10-14 (×8): qty 28.35
  Filled 2014-10-14: qty 0.9
  Filled 2014-10-14 (×2): qty 28.35
  Filled 2014-10-14: qty 0.9
  Filled 2014-10-14 (×6): qty 28.35
  Filled 2014-10-14: qty 0.9
  Filled 2014-10-14 (×9): qty 28.35

## 2014-10-14 MED ORDER — HEPARIN SODIUM (PORCINE) 5000 UNIT/ML IJ SOLN
5000.0000 [IU] | Freq: Three times a day (TID) | INTRAMUSCULAR | Status: DC
  Administered 2014-10-14 – 2014-10-18 (×11): 5000 [IU] via SUBCUTANEOUS
  Filled 2014-10-14 (×11): qty 1

## 2014-10-14 MED ORDER — TRAZODONE HCL 50 MG PO TABS: 100.0000 mg | ORAL_TABLET | Freq: Every evening | ORAL | Status: DC | PRN

## 2014-10-14 MED ORDER — ACETAMINOPHEN 325 MG PO TABS: 650.0000 mg | ORAL_TABLET | Freq: Four times a day (QID) | ORAL | Status: DC | PRN

## 2014-10-14 MED ORDER — IBUPROFEN 800 MG PO TABS: 800.0000 mg | ORAL_TABLET | Freq: Four times a day (QID) | ORAL | Status: DC | PRN

## 2014-10-14 MED ORDER — LORAZEPAM 0.5 MG PO TABS: 1.0000 mg | ORAL_TABLET | Freq: Four times a day (QID) | ORAL | Status: AC | PRN

## 2014-10-14 MED ORDER — SODIUM CHLORIDE 0.9 % IV BOLUS (SEPSIS)
500.0000 mL | Freq: Once | INTRAVENOUS | Status: AC
  Administered 2014-10-14: 500 mL via INTRAVENOUS

## 2014-10-14 MED ORDER — ADULT MULTIVITAMIN W/MINERALS CH
1.0000 | ORAL_TABLET | Freq: Every day | ORAL | Status: DC
  Administered 2014-10-14 – 2014-10-18 (×5): 1 via ORAL
  Filled 2014-10-14 (×5): qty 1

## 2014-10-14 MED ORDER — FOLIC ACID 1 MG PO TABS
1.0000 mg | ORAL_TABLET | Freq: Every day | ORAL | Status: DC
  Administered 2014-10-14 – 2014-10-18 (×5): 1 mg via ORAL
  Filled 2014-10-14 (×5): qty 1

## 2014-10-14 MED ORDER — PANTOPRAZOLE SODIUM 40 MG PO TBEC
40.0000 mg | DELAYED_RELEASE_TABLET | Freq: Every day | ORAL | Status: DC
  Administered 2014-10-14 – 2014-10-18 (×5): 40 mg via ORAL
  Filled 2014-10-14 (×5): qty 1

## 2014-10-14 MED ORDER — LORATADINE 10 MG PO TABS
10.0000 mg | ORAL_TABLET | Freq: Every day | ORAL | Status: DC
  Administered 2014-10-15 – 2014-10-18 (×4): 10 mg via ORAL
  Filled 2014-10-14 (×5): qty 1

## 2014-10-14 MED ORDER — SODIUM CHLORIDE 0.9 % IV SOLN
INTRAVENOUS | Status: DC
  Administered 2014-10-14 – 2014-10-17 (×5): via INTRAVENOUS

## 2014-10-14 MED ORDER — ONDANSETRON HCL 4 MG/2ML IJ SOLN: 4.0000 mg | Freq: Four times a day (QID) | INTRAMUSCULAR | Status: DC | PRN

## 2014-10-14 NOTE — ED Notes (Signed)
CRITICAL VALUE ALERT  Critical value received:  Lactic acid 3.6  Date of notification:  10/14/14  Time of notification:  2103  Critical value read back:Yes.    Nurse who received alert:  Matilde Haymaker, RN  MD notified (1st page):  Dr. Alvino Chapel  Time of first page:  2103  MD notified (2nd page):

## 2014-10-14 NOTE — ED Notes (Addendum)
Pt c/o recent lightheadedness. Pt states he became dizzy today after taking shower, sat on commode and woke up in the floor. Laceration to right forehead noted, bleeding controlled. denies any other pain/injury. Pt admits to drinking a few beers today, states he drinks daily.

## 2014-10-14 NOTE — H&P (Signed)
[facility] History and Physical  Victor Diaz KVQ:[id number] DOB: 19-Dec-1945 DOA: 10/14/2014  Referring physician: Dr Alvino Chapel - [facility] PCP: Dorothyann Peng, NP   Chief Complaint: LOC  HPI: Victor Diaz is a 69 y.o. male  Single episode of loss of consciousness. Patient states that he was taking a shower today when he began to feel dizzy. Patient states that the symptoms occur with almost every shower that he takes. Patient states he's had very little to eat over the last several days and has been staying primarily on treating alcohol. Patient realizing that he was about to pass out and sat down on the toilet to dry off and then woke up on the floor. Family members came to assist patient up from before. Patient fell from the toilet to the ground hitting his head on the floor causing a laceration.  Patient's baseline alcohol intake is a 12 pack per day.   Patient with ongoing intermittent episodes of chest pain  over the past year. Not associated with exertion, meals, laying flat, palpitations, shortness of breath. Came on suddenly and without identifiable inciting event.  Patient was recently placed on a Holter monitor but never followed up with cardiology after getting the results.   Review of Systems:  Constitutional:  No weight loss, night sweats, Fevers, chills, fatigue.  HEENT:  No headaches, Difficulty swallowing,Tooth/dental problems,Sore throat,  No sneezing, itching, ear ache, nasal congestion, post nasal drip,  Cardio-vascular: Per HPI GI:  No heartburn, indigestion, abdominal pain, nausea, vomiting, diarrhea, change in bowel habits, loss of appetite  Resp:   No shortness of breath with exertion or at rest. No excess mucus, no productive cough, No non-productive cough, No coughing up of blood.No change in color of mucus.No wheezing.No chest wall deformity  Skin:  Laceration but no rash GU:  no dysuria, change in color of urine, no urgency or frequency. No  flank pain.  Musculoskeletal:   No joint pain or swelling. No decreased range of motion. No back pain.  Psych:  No change in mood or affect. No depression or anxiety. No memory loss.   Past Medical History  Diagnosis Date  . Allergy   . Gout   . Hyperlipidemia   . EtOH dependence   . Hemothorax on right   . Lisfranc's dislocation 10/20/2011  . Right fibular fracture 10/20/2011   Past Surgical History  Procedure Laterality Date  . Appendectomy    . Hernia repair    . Tonsilectomy, adenoidectomy, bilateral myringotomy and tubes    . 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 Partial hip arthroplasty  2010    left   Social History:  reports that he quit smoking about 45 years ago. He has never used smokeless tobacco. He reports that he drinks about 1.8 oz of alcohol per week. He reports that he does not use illicit drugs.  Allergies  Allergen Reactions  . Atorvastatin Anaphylaxis    facial swelling    Family History  Problem Relation Age of Onset  . Arthritis Mother   . Tuberculosis Father      Prior to Admission medications   Medication Sig Start Date End Date Taking? Authorizing Provider  cetirizine (ZYRTEC) 10 MG tablet TAKE ONE TABLET BY MOUTH ONCE DAILY AS NEEDED. 09/01/14   Dorothyann Peng, NP  diazepam (VALIUM) 2 MG tablet Take 1/2 tablet by mouth daily prn. 09/11/14   Dorothyann Peng, NP  omeprazole (PRILOSEC) 20 MG capsule TAKE 1 CAPSULE BY MOUTH DAILY AS  NEEDED FOR HEART BURN. 05/20/14   Kennyth Arnold, FNP  simvastatin (ZOCOR) 40 MG tablet TAKE (1) TABLET BY MOUTH AT BEDTIME. 08/24/14   Dorothyann Peng, NP  traZODone (DESYREL) 100 MG tablet TAKE ONE TABLET BY MOUTH AT BEDTIME AS NEEDED FOR SLEEP. 08/07/14   Dorothyann Peng, NP   Physical Exam: Filed Vitals:   10/14/14 1626 10/14/14 1728 10/14/14 1846 10/14/14 1942  BP: 118/77 119/75 123/80 129/91  Pulse: 98 88 92 86  Temp: 98.4 F (36.9 C) 97.5 F (36.4 C) 98.4 F (36.9 C)   TempSrc: Oral  Oral   Resp: 18 18 17 18     SpO2: 96% 98% 96% 97%    Wt Readings from Last 3 Encounters:  08/07/14 96.616 kg (213 lb)  12/16/13 98.839 kg (217 lb 14.4 oz)  11/19/13 94.802 kg (209 lb)    General:  Appears calm and comfortable,  Eyes:  PERRL, normal lids, irises & conjunctiva ENT:  grossly normal hearing, lips & tongue Neck:  no LAD, masses or thyromegaly Cardiovascular:  RRR, II/VI systoli cmurmur. No LE edema. Respiratory:  CTA bilaterally, no w/r/r. Normal respiratory effort. Abdomen:  soft, ntnd Skin: No rash, Large laceration above R eyebrow Musculoskeletal:  grossly normal tone BUE/BLE Psychiatric:  grossly normal mood and affect, speech fluent and appropriate Neurologic:  grossly non-focal.          Labs on Admission:  Basic Metabolic Panel:  Recent Labs Lab 10/14/14 1654  NA 129*  K 4.1  CL 97*  CO2 16*  GLUCOSE 108*  BUN 8  CREATININE 1.04  CALCIUM 8.6*  MG 1.9   Liver Function Tests:  Recent Labs Lab 10/14/14 1654  AST 140*  ALT 98*  ALKPHOS 40  BILITOT 0.7  PROT 7.3  ALBUMIN 4.0   No results for input(s): LIPASE, AMYLASE in the last 168 hours. No results for input(s): AMMONIA in the last 168 hours. CBC:  Recent Labs Lab 10/14/14 1654  WBC 7.3  NEUTROABS 4.6  HGB 15.3  HCT 43.8  MCV 97.8  PLT 169   Cardiac Enzymes:  Recent Labs Lab 10/14/14 1654  TROPONINI <0.03    BNP (last 3 results) No results for input(s): BNP in the last 8760 hours.  ProBNP (last 3 results) No results for input(s): PROBNP in the last 8760 hours.  CBG:  Recent Labs Lab 10/14/14 1632  GLUCAP 98    Radiological Exams on Admission: Dg Chest 2 View  10/14/2014   CLINICAL DATA:  79-year-old male with lightheadedness.  EXAM: CHEST  2 VIEW  COMPARISON:  Chest radiograph dated 02/02/2012  FINDINGS: Two views of the chest demonstrate clear lungs. No pleural effusion or pneumothorax. Top-normal cardiac size. The osseous structures are grossly unremarkable.  IMPRESSION: No active  cardiopulmonary disease.   Electronically Signed   By: Anner Crete M.D.   On: 10/14/2014 20:57   Ct Head Wo Contrast  10/14/2014   CLINICAL DATA:  Trauma. Laceration to right-sided forehead. Chronic lightheadedness. Ethanol dependence.  EXAM: CT HEAD WITHOUT CONTRAST  TECHNIQUE: Contiguous axial images were obtained from the base of the skull through the vertex without intravenous contrast.  COMPARISON:  09/10/2012  FINDINGS: Sinuses/Soft tissues: Right parietal scalp soft tissue swelling on image 32 of series 4. This is chronic. Mucosal thickening of ethmoid air cells and left sphenoid sinus. Clear mastoid air cells.  Intracranial: Expected cerebral volume loss for age. Cerebellar atrophy. No mass lesion, hemorrhage, hydrocephalus, acute infarct, intra-axial, or extra-axial fluid collection.  IMPRESSION: 1.  No acute intracranial abnormality. 2. Cerebral and cerebellar atrophy. 3. Sinus disease.   Electronically Signed   By: Abigail Miyamoto M.D.   On: 10/14/2014 17:22    EKG: Independently reviewed. Sinus, pvc, no sign of ACS  Assessment/Plan Principal Problem:   Syncope Active Problems:   HLD (hyperlipidemia)   GERD   ETOH abuse   Hyponatremia   Metabolic acidosis   Chest pain   Laceration of head   Malnutrition   Syncope: likely multifactorial including vasovagal, malnutrition, metabolic derangement from ETOH use, and possible arrhythmia. Pt states that these episodes happen on a near daily basis. Recent Holter monitor showing intermittent Afib, RVR, and few SVT. Pt was asymptomatic while wearing holter monitor and never f/u w/ Cards. CT head w/o acute process noted. Orthostatic VS neg.  - Telemetry - Cards consult in am - A1c, TSH - management of other contributing factors as below.   ETOH abuse: AST140 ALT 98, Alcohol level 75. Baseline consumption of 12pk Beer p/day. - CIWA - UDS - Thiamine, Folate, MV  Chest pain: Cardiac vs MSK vs GI. Troponin neg. EKG w/o evidence of ACS.  Arrythmia on Holter monitor. CXR w/o acute process - NSAIDs - cycle troponin - GI cocktail - Tele  Head Lac: repaired by ED physician.  - ABX ointment daily - F/u for suture removal in 7-10 days  Hyponatremia: secondary to malnutrition adn ETOH use - IVF  Metabolic Acidosis: anticipate lactic acidosis from ETOH. Renal function preserved, no need for bicarb. Treatment as above - Lactic acid - IVF  HLD: - continue home Statin  GERD: - continue PPI  Code Status: FULL DVT Prophylaxis: Hep Family Communication: Son Disposition Plan: Pending improvememtn    Andon Villard Lenna Sciara, MD Family Medicine [facility] [email or web address] Password [email or web address]

## 2014-10-14 NOTE — ED Provider Notes (Signed)
CSN: [id number]     Arrival date & time 10/14/14  1617 History   First MD Initiated Contact with Patient 10/14/14 1930     Chief Complaint  Patient presents with  . Loss of Consciousness     (Consider location/radiation/quality/duration/timing/severity/associated sxs/prior Treatment) Patient is a 69 y.o. male presenting with syncope. The history is provided by the patient.  Loss of Consciousness Episode history:  Single Associated symptoms: no chest pain, no headaches, no nausea, no shortness of breath, no vomiting and no weakness    patient presents with an episode of syncope. States he was in the shower and began to feel dizzy. This is not that unusual for him. He has eaten very little last couple days it has still been drinking alcohol. He fell off the toilet after he got out because he was feeling lightheaded. He has a laceration of his forehead. He did have loss of consciousness. Does have occasional episodes of chest pain. States he was supposed to follow-up with cardiology and did not. No fevers. He's had decreased oral intake recently. States his last tetanus shot was fairly recent.  Past Medical History  Diagnosis Date  . Allergy   . Gout   . Hyperlipidemia   . EtOH dependence   . Hemothorax on right   . Lisfranc's dislocation 10/20/2011  . Right fibular fracture 10/20/2011   Past Surgical History  Procedure Laterality Date  . Appendectomy    . Hernia repair    . Tonsilectomy, adenoidectomy, bilateral myringotomy and tubes    . 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 Partial hip arthroplasty  2010    left   Family History  Problem Relation Age of Onset  . Arthritis Mother   . Tuberculosis Father    History  Substance Use Topics  . Smoking status: Former Smoker -- 1.00 packs/day for 10 years    Quit date: 08/09/1969  . Smokeless tobacco: Never Used  . Alcohol Use: 1.8 oz/week    3 Cans of beer per week     Comment: daily    Review of Systems  Constitutional:  Positive for appetite change. Negative for activity change.  Eyes: Negative for pain.  Respiratory: Negative for chest tightness and shortness of breath.   Cardiovascular: Positive for syncope. Negative for chest pain and leg swelling.  Gastrointestinal: Negative for nausea, vomiting, abdominal pain and diarrhea.  Genitourinary: Negative for flank pain.  Musculoskeletal: Negative for back pain and neck stiffness.  Skin: Positive for wound. Negative for rash.  Neurological: Positive for syncope and light-headedness. Negative for weakness, numbness and headaches.  Psychiatric/Behavioral: Negative for behavioral problems.      Allergies  Atorvastatin  Home Medications   Prior to Admission medications   Medication Sig Start Date End Date Taking? Authorizing Provider  cetirizine (ZYRTEC) 10 MG tablet TAKE ONE TABLET BY MOUTH ONCE DAILY AS NEEDED. 09/01/14   Dorothyann Peng, NP  diazepam (VALIUM) 2 MG tablet Take 1/2 tablet by mouth daily prn. 09/11/14   Dorothyann Peng, NP  omeprazole (PRILOSEC) 20 MG capsule TAKE 1 CAPSULE BY MOUTH DAILY AS NEEDED FOR HEART BURN. 05/20/14   Kennyth Arnold, FNP  simvastatin (ZOCOR) 40 MG tablet TAKE (1) TABLET BY MOUTH AT BEDTIME. 08/24/14   Dorothyann Peng, NP  traZODone (DESYREL) 100 MG tablet TAKE ONE TABLET BY MOUTH AT BEDTIME AS NEEDED FOR SLEEP. 08/07/14   Dorothyann Peng, NP   BP 129/91 mmHg  Pulse 86  Temp(Src) 98.4 F (36.9 C) (Oral)  Resp 18  SpO2 97% Physical Exam  Constitutional: He is oriented to person, place, and time. He appears well-developed and well-nourished.  HENT:  Head: Normocephalic.  Vertical laceration above right eyebrow of approximately 2.5 cm. Well approximated  Eyes: EOM are normal. Pupils are equal, round, and reactive to light.  Neck: Normal range of motion. Neck supple.  Cardiovascular: Normal rate, regular rhythm and normal heart sounds.   No murmur heard. Pulmonary/Chest: Effort normal and breath sounds normal.  Abdominal:  Soft. Bowel sounds are normal. He exhibits no distension and no mass. There is no tenderness. There is no rebound and no guarding.  Musculoskeletal: Normal range of motion. He exhibits no edema.  Neurological: He is alert and oriented to person, place, and time. No cranial nerve deficit.  Skin: Skin is warm and dry.  Psychiatric: He has a normal mood and affect.  Nursing note and vitals reviewed.   ED Course  Procedures (including critical care time) Labs Review Labs Reviewed  CBC WITH DIFFERENTIAL/PLATELET - Abnormal; Notable for the following:    MCH 34.2 (*)    All other components within normal limits  BASIC METABOLIC PANEL - Abnormal; Notable for the following:    Sodium 129 (*)    Chloride 97 (*)    CO2 16 (*)    Glucose, Bld 108 (*)    Calcium 8.6 (*)    Anion gap 16 (*)    All other components within normal limits  ETHANOL - Abnormal; Notable for the following:    Alcohol, Ethyl (B) 75 (*)    All other components within normal limits  HEPATIC FUNCTION PANEL - Abnormal; Notable for the following:    AST 140 (*)    ALT 98 (*)    All other components within normal limits  URINE RAPID DRUG SCREEN, HOSP PERFORMED - Abnormal; Notable for the following:    Benzodiazepines POSITIVE (*)    All other components within normal limits  LACTIC ACID, PLASMA - Abnormal; Notable for the following:    Lactic Acid, Venous 3.6 (*)    All other components within normal limits  TROPONIN I  MAGNESIUM  TSH  TROPONIN I  TROPONIN I  LACTIC ACID, PLASMA  HEMOGLOBIN A1C  CBG MONITORING, ED    Imaging Review Dg Chest 2 View  10/14/2014   CLINICAL DATA:  75-year-old male with lightheadedness.  EXAM: CHEST  2 VIEW  COMPARISON:  Chest radiograph dated 02/02/2012  FINDINGS: Two views of the chest demonstrate clear lungs. No pleural effusion or pneumothorax. Top-normal cardiac size. The osseous structures are grossly unremarkable.  IMPRESSION: No active cardiopulmonary disease.   Electronically  Signed   By: Anner Crete M.D.   On: 10/14/2014 20:57   Ct Head Wo Contrast  10/14/2014   CLINICAL DATA:  Trauma. Laceration to right-sided forehead. Chronic lightheadedness. Ethanol dependence.  EXAM: CT HEAD WITHOUT CONTRAST  TECHNIQUE: Contiguous axial images were obtained from the base of the skull through the vertex without intravenous contrast.  COMPARISON:  09/10/2012  FINDINGS: Sinuses/Soft tissues: Right parietal scalp soft tissue swelling on image 32 of series 4. This is chronic. Mucosal thickening of ethmoid air cells and left sphenoid sinus. Clear mastoid air cells.  Intracranial: Expected cerebral volume loss for age. Cerebellar atrophy. No mass lesion, hemorrhage, hydrocephalus, acute infarct, intra-axial, or extra-axial fluid collection.  IMPRESSION: 1.  No acute intracranial abnormality. 2. Cerebral and cerebellar atrophy. 3. Sinus disease.   Electronically Signed   By: Adria Devon.D.  On: 10/14/2014 17:22     EKG Interpretation   Date/Time:  Wednesday October 14 2014 16:28:08 EDT Ventricular Rate:  100 PR Interval:  150 QRS Duration: 72 QT Interval:  360 QTC Calculation: 464 R Axis:   -33 Text Interpretation:  Sinus rhythm with occasional Premature ventricular  complexes Left axis deviation Minimal voltage criteria for LVH, may be  normal variant Inferior infarct , age undetermined Abnormal ECG Confirmed  by Alvino Chapel  MD, Ovid Curd [phone]) on 10/14/2014 7:38:34 PM      MDM   Final diagnoses:  Syncope, unspecified syncope type  Dehydration  Forehead laceration, initial encounter    Patient with a syncopal episode. Has a laceration on his forehead that I closed. Negative head CT. Does have some electrolyte abnormalities likely due to drinking alcohol and not eating. Reviewing records shows that he does have a Holter monitor from last month that showed episodes of A. fib with RVR and episodes of SVT. Will admit to internal medicine  LACERATION REPAIR Performed by:  Mackie Pai. Authorized by: Mackie Pai Consent: Verbal consent obtained. Risks and benefits: risks, benefits and alternatives were discussed Consent given by: patient Patient identity confirmed: provided demographic data Prepped and Draped in normal sterile fashion Wound explored  Laceration Location: right forehead.  Laceration Length: 3cm  No Foreign Bodies seen or palpated  Anesthesia: local infiltration  Local anesthetic: lidocaine 1%   Anesthetic total: 3 ml  Irrigation method: syringe Amount of cleaning: standard  Skin closure: 4-0 vicryl  Number of sutures: 8  Technique: simple interupted  Patient tolerance: Patient tolerated the procedure well with no immediate complications.    Davonna Belling, MD 10/14/14 2150

## 2014-10-15 ENCOUNTER — Inpatient Hospital Stay (HOSPITAL_COMMUNITY): Payer: Medicare Other

## 2014-10-15 ENCOUNTER — Encounter (HOSPITAL_COMMUNITY): Payer: Self-pay | Admitting: Cardiology

## 2014-10-15 DIAGNOSIS — I471 Supraventricular tachycardia: Secondary | ICD-10-CM

## 2014-10-15 DIAGNOSIS — E871 Hypo-osmolality and hyponatremia: Secondary | ICD-10-CM

## 2014-10-15 DIAGNOSIS — F1010 Alcohol abuse, uncomplicated: Secondary | ICD-10-CM

## 2014-10-15 DIAGNOSIS — R0989 Other specified symptoms and signs involving the circulatory and respiratory systems: Secondary | ICD-10-CM

## 2014-10-15 DIAGNOSIS — R55 Syncope and collapse: Secondary | ICD-10-CM

## 2014-10-15 DIAGNOSIS — E785 Hyperlipidemia, unspecified: Secondary | ICD-10-CM

## 2014-10-15 DIAGNOSIS — I4892 Unspecified atrial flutter: Secondary | ICD-10-CM

## 2014-10-15 DIAGNOSIS — I483 Typical atrial flutter: Secondary | ICD-10-CM

## 2014-10-15 DIAGNOSIS — E860 Dehydration: Principal | ICD-10-CM

## 2014-10-15 LAB — BASIC METABOLIC PANEL
Anion gap: 10 (ref 5–15)
BUN: 9 mg/dL (ref 6–20)
CALCIUM: 8.3 mg/dL — AB (ref 8.9–10.3)
CO2: 23 mmol/L (ref 22–32)
Chloride: 101 mmol/L (ref 101–111)
Creatinine, Ser: 1.09 mg/dL (ref 0.61–1.24)
GFR calc Af Amer: >60 mL/min (ref >60)
GFR calc non Af Amer: >60 mL/min (ref >60)
Glucose, Bld: 118 mg/dL — ABNORMAL HIGH (ref 65–99)
POTASSIUM: 4.2 mmol/L (ref 3.5–5.1)
Sodium: 134 mmol/L — ABNORMAL LOW (ref 135–145)

## 2014-10-15 LAB — CBC
HEMATOCRIT: 37.8 % — AB (ref 39.0–52.0)
Hemoglobin: 12.8 g/dL — ABNORMAL LOW (ref 13.0–17.0)
MCH: 33.6 pg (ref 26.0–34.0)
MCHC: 33.9 g/dL (ref 30.0–36.0)
MCV: 99.2 fL (ref 78.0–100.0)
Platelets: 141 10*3/uL — ABNORMAL LOW (ref 150–400)
RBC: 3.81 MIL/uL — AB (ref 4.22–5.81)
RDW: 13.1 % (ref 11.5–15.5)
WBC: 5.3 10*3/uL (ref 4.0–10.5)

## 2014-10-15 LAB — LACTIC ACID, PLASMA: Lactic Acid, Venous: 2.5 mmol/L (ref 0.5–2.0)

## 2014-10-15 LAB — TROPONIN I: Troponin I: <0.03 ng/mL (ref <0.031)

## 2014-10-15 MED ORDER — METOPROLOL SUCCINATE ER 25 MG PO TB24
25.0000 mg | ORAL_TABLET | Freq: Every day | ORAL | Status: DC
  Administered 2014-10-15 – 2014-10-17 (×3): 25 mg via ORAL
  Filled 2014-10-15 (×4): qty 1

## 2014-10-15 MED ORDER — ZOLPIDEM TARTRATE 5 MG PO TABS
5.0000 mg | ORAL_TABLET | Freq: Once | ORAL | Status: AC
  Administered 2014-10-15: 5 mg via ORAL
  Filled 2014-10-15: qty 1

## 2014-10-15 MED ORDER — SODIUM CHLORIDE 0.9 % IV BOLUS (SEPSIS)
500.0000 mL | Freq: Once | INTRAVENOUS | Status: AC
  Administered 2014-10-15: 500 mL via INTRAVENOUS

## 2014-10-15 MED ORDER — METOPROLOL TARTRATE 25 MG PO TABS: 25.0000 mg | ORAL_TABLET | Freq: Two times a day (BID) | ORAL | Status: DC

## 2014-10-15 NOTE — Progress Notes (Signed)
Notified of a critical value, lactic acid 2.8. MD notified. New orders were placed and followed. Another lactic acid will be drawn in the morning. Will continue to monitor patient status.

## 2014-10-15 NOTE — Consult Note (Signed)
CARDIOLOGY CONSULT NOTE   Patient ID: Victor Diaz MRN: [id number] DOB/AGE: Apr 10, 1946 69 y.o.  Admit Date: 10/14/2014 Referring Physician: PTH - Memon Primary Care Provider: Dorothyann Peng, Victor Diaz Consulting Cardiologist: Victor Lesches MD Reason for Consultation: Syncope  Clinical Summary Victor Diaz is a 69 y.o.male no prior cardiac history, came to ER after an episode of syncope. He states he was feeling lightheaded and dizzy. Took a shower, feeling got worse, got out of the shower and sat on toilet to dry off and passed out. Son who lives with him got him to ER. He sustained a laceration to his right temple.   On arrival to ER  118/77, HR 98 bpm, O2 sat 96%, afebrile. He was found to be hyponatremic Na 129, creatinine 1.04, elevated alcohol level 75. Elevated LFTs, AST of 140; ALT 98. CXR negative for CHF and pneumonia. EKG sinus tachycardia with PVC, rate of 100 bpm.   He admits to drinking a 12-pack of beer daily with admissions to ETOH rehab facilities in the past, but he would return to drinking. He states that when he tries to stop drinking on his own, he drinks coffee 12 or more cups a day. He used to binge drink but now is drinking daily. He had consumed 6 beers prior to feeling dizzy, but states he feels dizzy when he is not drinking as well. He passes out a lot "when I'm not sober" but he did once pass out in the past when he wasn't drinking. He reports a fairly long-standing history of intermittent dizziness when standing.  A 48 recent Holter monitor was placed by his PCP which was abnormal demonstrating brief episode of atrial flutter and very short runs of SVT, occasional PVCs. He was referred to cardiology but did not come to appointment. He is aware of his HR skipping and feeling some flutters.  Allergies  Allergen Reactions  . Atorvastatin Anaphylaxis    facial swelling    Medications Scheduled Medications: . bacitracin   Topical BID  . folic acid  1 mg Oral  Daily  . heparin  5,000 Units Subcutaneous 3 times per day  . lidocaine (PF)  5 mL Infiltration Once  . loratadine  10 mg Oral Daily  . multivitamin with minerals  1 tablet Oral Daily  . pantoprazole  40 mg Oral Daily  . sodium chloride  3 mL Intravenous Q12H  . thiamine  100 mg Oral Daily   Or  . thiamine  100 mg Intravenous Daily    Infusions: . sodium chloride 100 mL/hr at 10/14/14 2325    PRN Medications: acetaminophen **OR** acetaminophen, gi cocktail, ibuprofen, LORazepam **OR** LORazepam, ondansetron **OR** ondansetron (ZOFRAN) IV   Past Medical History  Diagnosis Date  . Allergy   . Gout   . Hyperlipidemia   . EtOH dependence   . Hemothorax on right   . Lisfranc's dislocation 10/20/2011  . Right fibular fracture 10/20/2011    Past Surgical History  Procedure Laterality Date  . Appendectomy    . Hernia repair    . Tonsilectomy, adenoidectomy, bilateral myringotomy and tubes    . 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 Partial hip arthroplasty Left 2010    Family History  Problem Relation Age of Onset  . Arthritis Mother   . Tuberculosis Father     Social History Mr. Vinsant reports that he quit smoking about 45 years ago. His smoking use included Cigarettes. He has a 10 pack-year smoking history. He  has never used smokeless tobacco. Mr. Risdon reports that he drinks about 42.0 oz of alcohol per week.  Review of Systems Complete review of systems are found to be negative unless outlined in H&P above. Intermittent reflux symptoms, occasional palpitations as outlined. No reproducible exertional chest pain.  Physical Examination Blood pressure 132/71, pulse 94, temperature 97.5 F (36.4 C), temperature source Oral, resp. rate 18, SpO2 100 %.  Intake/Output Summary (Last 24 hours) at 10/15/14 0944 Last data filed at 10/15/14 0900  Gross per 24 hour  Intake 5988.33 ml  Output   1325 ml  Net 4663.33 ml    Telemetry: NSR  GEN: No acute distress-talking  opening about his drinking.  HEENT: Conjunctiva and lids normal, oropharynx clear with moist mucosa. Dressing to his right temple.  Neck: Supple, no elevated JVP, left carotid bruits, no thyromegaly. Lungs: Clear to auscultation, nonlabored breathing at rest. Cardiac: Regular rate and rhythm, 1/6 systolic murmur.  Abdomen: Soft, nontender, no hepatomegaly, bowel sounds present, no guarding or rebound. Obese. Extremities: No pitting edema, distal pulses 2+. Skin: Warm and dry. Musculoskeletal: No kyphosis. Neuropsychiatric: Alert and oriented x3, affect grossly appropriate.  Prior Cardiac Testing/Procedures 1. Echocardiogram 08/03/2011 Left ventricle: The cavity size was normal. There was mild concentric hypertrophy. Systolic function was normal. The estimated ejection fraction was in the range of 55% to 60%. Severe hypokinesis of of very small segment of myocardium at the base of theinferolateral myocardium. - Aortic valve: Mildly to moderately calcified annulus. Trileaflet; mildly thickened, mildly calcified leaflets. Cusp separation was mildly reduced. There was very mild stenosis. Valve area: 1.33cm^2(VTI). Valve area: 1.52cm^2 (Vmax). - Mitral valve: Calcified, moderately thickened annulus. - Atrial septum: No defect or patent foramen ovale was identified.  Holter Monitor 08/27/2014  NSR with PVCs, PACs. Several very short runs of SVT- lasting a few seconds  One episode of apparent atrial flutter with HR of 150, and spontaneous conversion to NSR.   Lab Results  Basic Metabolic Panel:  Recent Labs Lab 10/14/14 1654 10/15/14 0641  NA 129* 134*  K 4.1 4.2  CL 97* 101  CO2 16* 23  GLUCOSE 108* 118*  BUN 8 9  CREATININE 1.04 1.09  CALCIUM 8.6* 8.3*  MG 1.9  --     Liver Function Tests:  Recent Labs Lab 10/14/14 1654  AST 140*  ALT 98*  ALKPHOS 40  BILITOT 0.7  PROT 7.3  ALBUMIN 4.0    CBC:  Recent Labs Lab 10/14/14 1654  10/15/14 0641  WBC 7.3 5.3  NEUTROABS 4.6  --   HGB 15.3 12.8*  HCT 43.8 37.8*  MCV 97.8 99.2  PLT 169 141*    Cardiac Enzymes:  Recent Labs Lab 10/14/14 1654 10/14/14 2315 10/15/14 0641  TROPONINI <0.03 <0.03 <0.03    Radiology: Dg Chest 2 View  10/14/2014   CLINICAL DATA:  62-year-old male with lightheadedness.  EXAM: CHEST  2 VIEW  COMPARISON:  Chest radiograph dated 02/02/2012  FINDINGS: Two views of the chest demonstrate clear lungs. No pleural effusion or pneumothorax. Top-normal cardiac size. The osseous structures are grossly unremarkable.  IMPRESSION: No active cardiopulmonary disease.   Electronically Signed   By: Anner Crete M.D.   On: 10/14/2014 20:57   Ct Head Wo Contrast  10/14/2014   CLINICAL DATA:  Trauma. Laceration to right-sided forehead. Chronic lightheadedness. Ethanol dependence.  EXAM: CT HEAD WITHOUT CONTRAST  TECHNIQUE: Contiguous axial images were obtained from the base of the skull through the vertex without intravenous contrast.  COMPARISON:  09/10/2012  FINDINGS: Sinuses/Soft tissues: Right parietal scalp soft tissue swelling on image 32 of series 4. This is chronic. Mucosal thickening of ethmoid air cells and left sphenoid sinus. Clear mastoid air cells.  Intracranial: Expected cerebral volume loss for age. Cerebellar atrophy. No mass lesion, hemorrhage, hydrocephalus, acute infarct, intra-axial, or extra-axial fluid collection.  IMPRESSION: 1.  No acute intracranial abnormality. 2. Cerebral and cerebellar atrophy. 3. Sinus disease.   Electronically Signed   By: Abigail Miyamoto M.D.   On: 10/14/2014 17:22    ECG: Sinus rhythm with increased voltage, left axis deviation versus old inferior infarct pattern, PVC.    Impression and Recommendations  1. Syncope: Likely multifactorial in the setting of ETOH abuse and recently documented arrhythmias. Telemetry during hospitalization has been largely noncontributory. Could be orthostatic component with  vasodilatation in the setting of alcohol intake, although was not frankly orthostatic on evaluation. Echocardiogram is being obtained to assess cardiac structure and function, mainly to exclude alcohol-induced cardiomyopathy.  2. Transiently documented atrial flutter and brief runs of SVT by Holter monitor in May: CHADS Vasc Score of 1 currently. With ETOH abuse and syncope do not think he is a good candidate at this time for anticoagulation therapy. Toprol-XL will be added to his regimen.  3. ETOH Abuse: Significant contributor to his current status. He is on DT precautions currently and would expect some cardiac arrhythmias in this setting. BB is ordered. Alcohol cessation discussed with him today.  4. Hypercholesterolemia: On Zocor per PCP. LFT's are elevated, could be secondary to EtOH, although we will cut back dosage Zocor.  5. Left carotid bruit: Consider follow-up carotid Dopplers. Last study was in December 2014.  Signed: Phill Diaz. Lawrence Victor Diaz [facility]  10/15/2014, 9:44 AM Co-Sign MD   Attending note:  Patient seen and examined. Reviewed records and discussed the case with Ms. Lawrence Victor Diaz. I modified the above note regarding impression and recommendations. Mr. Highbaugh presents with a fairly long-standing history of intermittent dizziness, usually when standing. He had a recent episode of syncope while in the shower, proceeded by a feeling of general lightheadedness and weakness. He also has intermittent sense of palpitations which have been long-standing. He is an alcoholic, actively drinks up to 12 beers daily. He recently saw his primary care provider and a 48 hour Holter monitor was obtained in May that did demonstrate brief runs of SVT, and also a short episode of atrial flutter. No syncope reported with these arrhythmias. He was not found to be frankly orthostatic on assessment here. Telemetry has been noncontributory so far as well. On examination he appears comfortable, dressed  laceration to right forehead noted with clean dressing. Lungs are clear and nonlabored, cardiac exam with RRR and no gallop. Soft left carotid bruit. ECG shows sinus rhythm with increased voltage, left axis deviation versus old inferior infarct pattern, and single PVC. Plan at this time is to obtain an echocardiogram to further assess cardiac structure and function, mainly to exclude alcohol-induced cardiomyopathy. We also plan to start Toprol-XL 25 mg daily. CHADSVASC score is 1, he is not an optimal candidate for anticoagulation at this time, particularly in light of active alcohol abuse. We did discuss the critical importance of alcohol cessation, recommended seeking further treatment for this, which he has done in the past. Although probably not related to his syncope, carotid Dopplers could be considered to follow-up on his left carotid bruit, previous study was in December 2014.  Satira Sark, M.D., F.A.C.C.

## 2014-10-15 NOTE — Progress Notes (Signed)
[facility] PROGRESS NOTE  Victor Diaz RKY:[id number] DOB: 1946-02-24 DOA: 10/14/2014 PCP: Dorothyann Peng, NP  Assessment/Plan: Syncope: likely multifactorial including vasovagal, malnutrition, metabolic derangement from ETOH use, and possible arrhythmia. Pt states that these episodes happen on a near daily basis. Recent Holter monitor showing intermittent Afib, RVR, and few SVT. CT head w/o acute process noted. Orthostatic VS neg. No events on tele. TSH 3.4 and A1c pending. Echo in 2013 with EF 55% and severe hypokinesis of very small segment of myocardium.  Evaluated by cardiology who recommend echo, carotid doppler given left carotid bruit and toprol xl. Cards opine  CHADSVASC score 1 and not optimal candidate anticoagulation given ETOH.   ETOH abuse: AST140 ALT 98, Alcohol level 75. Baseline consumption of 12pk Beer p/day. - CIWA. UDS negative. continue Thiamine, Folate, MV. No s/sx withdrawal at present  Chest pain: denies CP. Troponin neg x 3. EKG w/o evidence of ACS. Arrythmia on Holter monitor in past but asymptomatic at that time. CXR w/o acute process. Evaluated by cardiology. See above.   Head Lac: repaired by ED physician.  - ABX ointment daily - F/u for suture removal in 7-10 days  Hyponatremia: secondary to malnutrition adn ETOH use improving with  IVF  Metabolic Acidosis: anticipate lactic acidosis from ETOH. Renal function preserved. Lactic acid trending downward. Continue IVF at slightly slower rate  HLD: - continue home Statin  GERD: stable .   Code Status: full Family Communication: none present Disposition Plan: home hopefully 24 hours   Consultants:  cardiology  Procedures:  Echo   Antibiotics:  none  HPI/Subjective: Denies pain/discomfort.   Objective: Filed Vitals:   10/15/14 0502  BP: 132/71  Pulse: 94  Temp: 97.5 F (36.4 C)  Resp: 18    Intake/Output Summary (Last 24 hours) at 10/15/14 1118 Last data filed at 10/15/14  1046  Gross per 24 hour  Intake 5988.33 ml  Output   1675 ml  Net 4313.33 ml   There were no vitals filed for this visit.  Exam:   General:  Well nourished  Cardiovascular: RRR +murmur no LE edema  Respiratory: normal effort BS clear bilaterally no wheeze  Abdomen: soft +BS non-tender to palpation  Musculoskeletal: no clubbing or cyanosis  Skin: dressing to right temple/forehead.    Data Reviewed: Basic Metabolic Panel:  Recent Labs Lab 10/14/14 1654 10/15/14 0641  NA 129* 134*  K 4.1 4.2  CL 97* 101  CO2 16* 23  GLUCOSE 108* 118*  BUN 8 9  CREATININE 1.04 1.09  CALCIUM 8.6* 8.3*  MG 1.9  --    Liver Function Tests:  Recent Labs Lab 10/14/14 1654  AST 140*  ALT 98*  ALKPHOS 40  BILITOT 0.7  PROT 7.3  ALBUMIN 4.0   No results for input(s): LIPASE, AMYLASE in the last 168 hours. No results for input(s): AMMONIA in the last 168 hours. CBC:  Recent Labs Lab 10/14/14 1654 10/15/14 0641  WBC 7.3 5.3  NEUTROABS 4.6  --   HGB 15.3 12.8*  HCT 43.8 37.8*  MCV 97.8 99.2  PLT 169 141*   Cardiac Enzymes:  Recent Labs Lab 10/14/14 1654 10/14/14 2315 10/15/14 0641  TROPONINI <0.03 <0.03 <0.03   BNP (last 3 results) No results for input(s): BNP in the last 8760 hours.  ProBNP (last 3 results) No results for input(s): PROBNP in the last 8760 hours.  CBG:  Recent Labs Lab 10/14/14 1632  GLUCAP 98    No results found for this  or any previous visit (from the past 240 hour(s)).   Studies: Dg Chest 2 View  10/14/2014   CLINICAL DATA:  36-year-old male with lightheadedness.  EXAM: CHEST  2 VIEW  COMPARISON:  Chest radiograph dated 02/02/2012  FINDINGS: Two views of the chest demonstrate clear lungs. No pleural effusion or pneumothorax. Top-normal cardiac size. The osseous structures are grossly unremarkable.  IMPRESSION: No active cardiopulmonary disease.   Electronically Signed   By: Anner Crete M.D.   On: 10/14/2014 20:57   Ct Head Wo  Contrast  10/14/2014   CLINICAL DATA:  Trauma. Laceration to right-sided forehead. Chronic lightheadedness. Ethanol dependence.  EXAM: CT HEAD WITHOUT CONTRAST  TECHNIQUE: Contiguous axial images were obtained from the base of the skull through the vertex without intravenous contrast.  COMPARISON:  09/10/2012  FINDINGS: Sinuses/Soft tissues: Right parietal scalp soft tissue swelling on image 32 of series 4. This is chronic. Mucosal thickening of ethmoid air cells and left sphenoid sinus. Clear mastoid air cells.  Intracranial: Expected cerebral volume loss for age. Cerebellar atrophy. No mass lesion, hemorrhage, hydrocephalus, acute infarct, intra-axial, or extra-axial fluid collection.  IMPRESSION: 1.  No acute intracranial abnormality. 2. Cerebral and cerebellar atrophy. 3. Sinus disease.   Electronically Signed   By: Abigail Miyamoto M.D.   On: 10/14/2014 17:22    Scheduled Meds: . bacitracin   Topical BID  . folic acid  1 mg Oral Daily  . heparin  5,000 Units Subcutaneous 3 times per day  . lidocaine (PF)  5 mL Infiltration Once  . loratadine  10 mg Oral Daily  . metoprolol succinate  25 mg Oral Daily  . multivitamin with minerals  1 tablet Oral Daily  . pantoprazole  40 mg Oral Daily  . sodium chloride  3 mL Intravenous Q12H  . thiamine  100 mg Oral Daily   Or  . thiamine  100 mg Intravenous Daily   Continuous Infusions: . sodium chloride 100 mL/hr at 10/14/14 2325    Principal Problem:   Syncope Active Problems:   HLD (hyperlipidemia)   GERD   ETOH abuse   Hyponatremia   Metabolic acidosis   Chest pain   Laceration of head   Malnutrition    Time spent: 30 mintutes    [facility] Pager [phone]. If 7PM-7AM, please contact night-coverage at [email or web address], password [facility] 10/15/2014, 11:18 AM  LOS: 1 day

## 2014-10-15 NOTE — Evaluation (Signed)
Physical Therapy Evaluation Patient Details Name: Victor Diaz MRN: [id number] DOB: [birth year] Today's Date: 10/15/2014   History of Present Illness  Single episode of loss of consciousness. Patient states that he was taking a shower today when he began to feel dizzy. Patient states that the symptoms occur with almost every shower that he takes. Patient states he's had very little to eat over the last several days and has been staying primarily on treating alcohol. Patient realizing that he was about to pass out and sat down on the toilet to dry off and then woke up on the floor. Family members came to assist patient up from before. Patient fell from the toilet to the ground hitting his head on the floor causing a laceration. Patient's baseline alcohol intake is a 12 pack per day. Patient with ongoing intermittent episodes of chest pain over the past year. Not associated with exertion, meals, laying flat, palpitations, shortness of breath. Came on suddenly and without identifiable inciting event.  Clinical Impression  Patient found in bed with family member present, very pleasant and willing to participate in skilled PT session. Patient able to perform toileting with distant supervision and ambulated without assistive device however was slightly unsteady during gait. Patient and family report that patient is grossly at baseline level of function with no concerns about physical mobility or returning home at this time. At this time patient is not in need of skilled PT services during his stay at this facility due to being grossly at baseline level of function, but may benefit from participation in skilled outpatient PT services in order to address his unsteadiness and balance and reduce overall fall risk.     Follow Up Recommendations Outpatient PT    Equipment Recommendations  None recommended by PT    Recommendations for Other Services       Precautions / Restrictions  Precautions Precautions: Fall Restrictions Weight Bearing Restrictions: No      Mobility  Bed Mobility Overal bed mobility: Independent                Transfers Overall transfer level: Independent Equipment used: None                Ambulation/Gait Ambulation/Gait assistance: Independent   Assistive device: None Gait Pattern/deviations: Step-through pattern;Decreased step length - right;Decreased step length - left;Decreased dorsiflexion - right;Decreased dorsiflexion - left;Trunk flexed   Gait velocity interpretation: Below normal speed for age/gender General Gait Details: apparent fatigue after gait distance   Stairs            Wheelchair Mobility    Modified Rankin (Stroke Patients Only)       Balance Overall balance assessment: Needs assistance Sitting-balance support: No upper extremity supported       Standing balance support: No upper extremity supported Standing balance-Leahy Scale: Fair   Single Leg Stance - Right Leg: 2 Single Leg Stance - Left Leg: 2 Tandem Stance - Right Leg: 3 Tandem Stance - Left Leg: 3                     Pertinent Vitals/Pain Pain Assessment: No/denies pain    Home Living Family/patient expects to be discharged to:: Private residence Living Arrangements: Children Available Help at Discharge: Family Type of Home: House Home Access: Stairs to enter Entrance Stairs-Rails: None Entrance Stairs-Number of Steps: 2-3 Home Layout: Two level;Other (Comment) (single floor home with basement, mostly stays on first floor with rare activity in basement ) Home  Equipment: Gilford Rile - 2 wheels;Bedside commode;Cane - single point      Prior Function                 Hand Dominance        Extremity/Trunk Assessment               Lower Extremity Assessment: Generalized weakness      Cervical / Trunk Assessment: Kyphotic;Other exceptions  Communication      Cognition Arousal/Alertness:  Awake/alert Behavior During Therapy: WFL for tasks assessed/performed Overall Cognitive Status: Within Functional Limits for tasks assessed                      General Comments      Exercises        Assessment/Plan    PT Assessment All further PT needs can be met in the next venue of care  PT Diagnosis Generalized weakness;Difficulty walking;Other (comment) (impaired balance )   PT Problem List Decreased strength;Decreased coordination;Decreased activity tolerance;Decreased balance;Decreased safety awareness;Decreased mobility  PT Treatment Interventions     PT Goals (Current goals can be found in the Care Plan section) Acute Rehab PT Goals Patient Stated Goal: to return home  PT Goal Formulation: With patient Time For Goal Achievement: 10/29/14 Potential to Achieve Goals: Good    Frequency     Barriers to discharge        Co-evaluation               End of Session   Activity Tolerance: Patient tolerated treatment well Patient left: in bed;with call bell/phone within reach;with family/visitor present;with bed alarm set           Time: 1610-9604 PT Time Calculation (min) (ACUTE ONLY): 23 min   Charges:   PT Evaluation $Initial PT Evaluation Tier I: 1 Procedure     PT G Codes:        Deniece Ree PT, DPT [phone]

## 2014-10-15 NOTE — Progress Notes (Signed)
CRITICAL VALUE ALERT  Critical value received:  Lactic acid  Date of notification:  10/15/2014  Time of notification:  0740  Critical value read back:Yes.    Nurse who received alert:  Tacey Heap RN  MD notified (1st page):  Dr Roderic Palau  Time of first page:  [phone]  Will continue to monitor pt

## 2014-10-16 DIAGNOSIS — D696 Thrombocytopenia, unspecified: Secondary | ICD-10-CM | POA: Diagnosis present

## 2014-10-16 DIAGNOSIS — I4892 Unspecified atrial flutter: Secondary | ICD-10-CM | POA: Diagnosis not present

## 2014-10-16 DIAGNOSIS — G458 Other transient cerebral ischemic attacks and related syndromes: Secondary | ICD-10-CM | POA: Diagnosis present

## 2014-10-16 DIAGNOSIS — R945 Abnormal results of liver function studies: Secondary | ICD-10-CM

## 2014-10-16 DIAGNOSIS — R7989 Other specified abnormal findings of blood chemistry: Secondary | ICD-10-CM | POA: Diagnosis present

## 2014-10-16 DIAGNOSIS — I6523 Occlusion and stenosis of bilateral carotid arteries: Secondary | ICD-10-CM

## 2014-10-16 LAB — BASIC METABOLIC PANEL
ANION GAP: 8 (ref 5–15)
BUN: 8 mg/dL (ref 6–20)
CHLORIDE: 107 mmol/L (ref 101–111)
CO2: 23 mmol/L (ref 22–32)
Calcium: 8.6 mg/dL — ABNORMAL LOW (ref 8.9–10.3)
Creatinine, Ser: 1.09 mg/dL (ref 0.61–1.24)
GFR calc Af Amer: >60 mL/min (ref >60)
GLUCOSE: 109 mg/dL — AB (ref 65–99)
Potassium: 3.9 mmol/L (ref 3.5–5.1)
SODIUM: 138 mmol/L (ref 135–145)

## 2014-10-16 LAB — CBC
HCT: 37.5 % — ABNORMAL LOW (ref 39.0–52.0)
HEMOGLOBIN: 12.7 g/dL — AB (ref 13.0–17.0)
MCH: 33.7 pg (ref 26.0–34.0)
MCHC: 33.9 g/dL (ref 30.0–36.0)
MCV: 99.5 fL (ref 78.0–100.0)
Platelets: 129 10*3/uL — ABNORMAL LOW (ref 150–400)
RBC: 3.77 MIL/uL — ABNORMAL LOW (ref 4.22–5.81)
RDW: 13.3 % (ref 11.5–15.5)
WBC: 4.6 10*3/uL (ref 4.0–10.5)

## 2014-10-16 LAB — LACTIC ACID, PLASMA: LACTIC ACID, VENOUS: 1.1 mmol/L (ref 0.5–2.0)

## 2014-10-16 LAB — HEMOGLOBIN A1C
Hgb A1c MFr Bld: 5.6 % (ref 4.8–5.6)
Mean Plasma Glucose: 114 mg/dL

## 2014-10-16 LAB — MRSA PCR SCREENING: MRSA BY PCR: NEGATIVE

## 2014-10-16 MED ORDER — AMIODARONE LOAD VIA INFUSION
150.0000 mg | Freq: Once | INTRAVENOUS | Status: AC
  Administered 2014-10-16: 150 mg via INTRAVENOUS
  Filled 2014-10-16: qty 83.34

## 2014-10-16 MED ORDER — DIGOXIN 0.25 MG/ML IJ SOLN: 0.5000 mg | Freq: Once | INTRAMUSCULAR | Status: DC

## 2014-10-16 MED ORDER — DILTIAZEM HCL 100 MG IV SOLR
5.0000 mg/h | INTRAVENOUS | Status: DC
  Administered 2014-10-16: 5 mg/h via INTRAVENOUS
  Filled 2014-10-16: qty 100

## 2014-10-16 MED ORDER — AMIODARONE HCL IN DEXTROSE 360-4.14 MG/200ML-% IV SOLN
60.0000 mg/h | INTRAVENOUS | Status: AC
  Administered 2014-10-16: 60 mg/h via INTRAVENOUS

## 2014-10-16 MED ORDER — DIGOXIN 125 MCG PO TABS: 0.2500 mg | ORAL_TABLET | Freq: Every day | ORAL | Status: DC

## 2014-10-16 MED ORDER — AMIODARONE HCL IN DEXTROSE 360-4.14 MG/200ML-% IV SOLN
INTRAVENOUS | Status: AC
  Administered 2014-10-16: 60 mg/h via INTRAVENOUS
  Filled 2014-10-16: qty 200

## 2014-10-16 MED ORDER — AMIODARONE HCL IN DEXTROSE 360-4.14 MG/200ML-% IV SOLN
30.0000 mg/h | INTRAVENOUS | Status: DC
  Administered 2014-10-16 – 2014-10-17 (×2): 30 mg/h via INTRAVENOUS
  Filled 2014-10-16 (×2): qty 200

## 2014-10-16 MED ORDER — BISACODYL 5 MG PO TBEC
10.0000 mg | DELAYED_RELEASE_TABLET | Freq: Once | ORAL | Status: AC
  Administered 2014-10-16: 10 mg via ORAL
  Filled 2014-10-16: qty 2

## 2014-10-16 MED ORDER — DIGOXIN 0.25 MG/ML IJ SOLN: 0.2500 mg | Freq: Four times a day (QID) | INTRAMUSCULAR | Status: DC

## 2014-10-16 MED ORDER — POLYETHYLENE GLYCOL 3350 17 G PO PACK
17.0000 g | PACK | Freq: Every day | ORAL | Status: DC
  Administered 2014-10-16 – 2014-10-18 (×3): 17 g via ORAL
  Filled 2014-10-16 (×3): qty 1

## 2014-10-16 MED ORDER — ASPIRIN 325 MG PO TABS: 325.0000 mg | ORAL_TABLET | Freq: Once | ORAL | Status: DC

## 2014-10-16 MED ORDER — DILTIAZEM LOAD VIA INFUSION
10.0000 mg | Freq: Once | INTRAVENOUS | Status: DC
  Filled 2014-10-16: qty 10

## 2014-10-16 MED ORDER — AMIODARONE IV BOLUS ONLY 150 MG/100ML
150.0000 mg | Freq: Once | INTRAVENOUS | Status: DC
  Filled 2014-10-16: qty 100

## 2014-10-16 MED ORDER — AMIODARONE HCL 150 MG/3ML IV SOLN: 150.0000 mg | Freq: Once | INTRAVENOUS | Status: DC

## 2014-10-16 NOTE — Progress Notes (Addendum)
Consulting cardiologist: Satira Sark, MD  Cardiology Specific Problem List: 1. Atrial flutter 2. Syncope  Subjective:    Patient moved to ICU this AM in the setting of rapid atrial flutter and placed on diltiazem gtt. He reports no chest pain or sense of palpitations at this time.  Objective:   Temp:  [97.7 F (36.5 C)-97.9 F (36.6 C)] 97.9 F (36.6 C) (07/01 0705) Pulse Rate:  [80-144] 144 (07/01 0635) Resp:  [18-24] 24 (07/01 0705) BP: (101-133)/(59-97) 133/97 mmHg (07/01 0635) SpO2:  [96 %] 96 % (07/01 0511) Weight:  [208 lb (94.348 kg)-219 lb 9.3 oz (99.6 kg)] 219 lb 9.3 oz (99.6 kg) (07/01 0705) Last BM Date: 10/14/14  Filed Weights   10/15/14 1610 10/16/14 0705  Weight: 208 lb (94.348 kg) 219 lb 9.3 oz (99.6 kg)    Intake/Output Summary (Last 24 hours) at 10/16/14 0831 Last data filed at 10/16/14 0740  Gross per 24 hour  Intake 2927.5 ml  Output   2125 ml  Net  802.5 ml    Telemetry: Atrial flutter rate of 150 bpm.  Exam:  General: No acute distress.  Lungs: Clear to auscultation, nonlabored.  Cardiac: No elevated JVP or bruits. Rapid RR, no gallop or rub.   Abdomen: Normoactive bowel sounds, nontender, nondistended.  Extremities: No pitting edema, distal pulses full.  Neuropsychiatric: Alert and oriented x3, affect appropriate.   Lab Results:  Basic Metabolic Panel:  Recent Labs Lab 10/14/14 1654 10/15/14 0641 10/16/14 0639  NA 129* 134* 138  K 4.1 4.2 3.9  CL 97* 101 107  CO2 16* 23 23  GLUCOSE 108* 118* 109*  BUN 8 9 8   CREATININE 1.04 1.09 1.09  CALCIUM 8.6* 8.3* 8.6*  MG 1.9  --   --     Liver Function Tests:  Recent Labs Lab 10/14/14 1654  AST 140*  ALT 98*  ALKPHOS 40  BILITOT 0.7  PROT 7.3  ALBUMIN 4.0    CBC:  Recent Labs Lab 10/14/14 1654 10/15/14 0641 10/16/14 0639  WBC 7.3 5.3 4.6  HGB 15.3 12.8* 12.7*  HCT 43.8 37.8* 37.5*  MCV 97.8 99.2 99.5  PLT 169 141* 129*    Cardiac  Enzymes:  Recent Labs Lab 10/14/14 2315 10/15/14 0641 10/15/14 1107  TROPONINI <0.03 <0.03 <0.03    Echocardiogram 10/15/2014: Study Conclusions  - Left ventricle: The cavity size was normal. Wall thickness was normal. Systolic function was normal. The estimated ejection fraction was in the range of 55% to 60%. There is akinesis of the basalinferolateral myocardium. Features are consistent with a pseudonormal left ventricular filling pattern, with concomitant abnormal relaxation and increased filling pressure (grade 2 diastolic dysfunction). - Aortic valve: There was mild stenosis. There was trivial regurgitation. Mean gradient (S): 7 mm Hg. Peak gradient (S): 15 mm Hg. Valve area by planimetry 1.7 cm2. - Mitral valve: Calcified annulus. There was trivial regurgitation. - Left atrium: The atrium was at the upper limits of normal in size. - Right atrium: Central venous pressure (est): 3 mm Hg. - Tricuspid valve: There was trivial regurgitation. - Pulmonary arteries: PA peak pressure: 27 mm Hg (S). - Pericardium, extracardiac: There was no pericardial effusion.  Impressions:  - Normal LV wall thickness with LVEF 16-10%, grade 2 diastolic dysfunction. Basal inferolateral akinesis noted. Upper normal left atrial size. Mild aortic stenosis as outlined. Mild aortic regurgitation. Trivial tricuspid regurgitation with PASP 27 mmHg.   ECG: Atrial flutter with variable block. Rate of 131 bpm.  Medications:   Scheduled Medications: . amiodarone  150 mg Intravenous Once  . aspirin  325 mg Oral Once  . bacitracin   Topical BID  . diltiazem  10 mg Intravenous Once  . folic acid  1 mg Oral Daily  . heparin  5,000 Units Subcutaneous 3 times per day  . lidocaine (PF)  5 mL Infiltration Once  . loratadine  10 mg Oral Daily  . metoprolol succinate  25 mg Oral Daily  . multivitamin with minerals  1 tablet Oral Daily  . pantoprazole  40 mg Oral Daily  .  sodium chloride  3 mL Intravenous Q12H  . thiamine  100 mg Oral Daily   Or  . thiamine  100 mg Intravenous Daily    Infusions: . sodium chloride 75 mL/hr at 10/15/14 2001  . amiodarone     Followed by  . amiodarone    . diltiazem (CARDIZEM) infusion 10 mg/hr (10/16/14 0804)    PRN Medications: acetaminophen **OR** acetaminophen, gi cocktail, ibuprofen, LORazepam **OR** LORazepam, ondansetron **OR** ondansetron (ZOFRAN) IV   Assessment and Plan:   1. Atrial flutter with RVR: Episode started this AM. Has had documented paroxysmal atrial flutter already. CHADSVASC score is 1, and generally felt to be suboptimal candidate for long-term anticoagulation at this time in light of ongoing alcohol abuse. Overall LVEF preserved by echocardiogram. Patient moved to ICU by primary team and placed on Cardizem infusion, heart rate still around 119 and systolic blood pressure in the 90-100 range limiting further up titration. He does not endorse any chest pain or palpitations. Our plan is to initiate intravenous amiodarone to help with rate control and hopefully also convert him back to sinus rhythm. We will also start intravenous heparin in case he ultimately needs to be electrically cardioverted. He is eating breakfast now and therefore will hold off on electrical cardioversion this point.  2. Syncope: Likely multifactorial and related to ETOH abuse and arrhythmia's.  3. ETOH Abuse: Significantly contributing to his overall status. He is not in DTs now but will need to continue current prophylaxis per PTH.   4. Elevated LFTs: AST 140 and ALT 98. Most likely related to alcohol use. He was on Zocor as an outpatient as well, and this has been stopped. Use of amiodarone is to be temporary, need to follow-up LFTs tomorrow.   Phill Myron. Lawrence NP [place]  10/16/2014, 8:31 AM    Attending note:  Patient seen and examined. Reviewed hospital course and discussed the case with Ms. Lawrence NP. I modified the  above note. Patient developed rapid atrial flutter earlier this morning and was transferred to the ICU on diltiazem infusion by primary team. Unfortunately, blood pressure limits further up titration for heart rate control. He is in no distress, denies any chest pain or palpitations. Echocardiogram outlined above shows overall preserved LVEF. Our plan at this time is to initiate IV amiodarone in an attempt to assist further with heart rate control and hopefully help him convert back to sinus rhythm. Use of amiodarone is to be temporary, would be concerned about long-term use in light of his abnormal LFTs and alcohol abuse history. He will need to have follow-up LFTs tomorrow. Although not a good long-term anticoagulation candidate at this time, would suggest starting heparin GTT tomorrow if his atrial flutter persists, in case that we need to pursue electrical cardioversion as an inpatient. He was already eating breakfast this morning, and therefore could not pursue electrical cardioversion this morning. Otherwise continue aspirin  and Toprol-XL, try and wean off diltiazem infusion once amiodarone has been started.  Satira Sark, M.D., F.A.C.C.

## 2014-10-16 NOTE — Progress Notes (Signed)
[facility] PROGRESS NOTE  Victor Diaz PNT:[id number] DOB: May 10, 1945 DOA: 10/14/2014 PCP: Dorothyann Peng, NP  Assessment/Plan:  Aflutter: sudden onset this am. Transferred to SD and cardizem gtt started. Cards evaluated and recommend amiodorone gtt and titration of cardizem as BP allows. Monitor for conversion back to sinus rhythm.  Recommending heparin in am if no improvement in HR in anticipation of electrically cardioversion. HR this am 149. Denies pain/palpitation/sob.   Syncope/subclavian artery stenosis/subclavian steel: per [place]. Likely related to significant left subclavian artery stenosis resulting in flow reversal in left vertebral artery suggesting subclavian steal. Carotid dopplar show moderate stenosis bilaterally as well. Echo with normal EF and some wall motion abnormalities. Recent Holter monitor showing intermittent Afib, RVR, and few SVT. CT head w/o acute process noted. Orthostatic VS neg. No events on tele. TSH 3.4 and A1c 5.6.  Will need to discuss with vascular   ETOH abuse: AST140 ALT 98, Alcohol level 75. Baseline consumption of 12pk Beer p/day. - CIWA. UDS negative. continue Thiamine, Folate, MV. No s/sx withdrawal at present  Chest pain: denies CP. Troponin neg x 3. EKG w/o evidence of ACS. Arrythmia on Holter monitor in past but asymptomatic at that time. CXR w/o acute process. Evaluated by cardiology. See above.   Head Lac: repaired by ED physician.  - ABX ointment daily - F/u for suture removal in 7-10 days  Hyponatremia: resolved this am. Likely related to malnutrition adn ETOH use improving with IVF  Metabolic Acidosis: anticipate lactic acidosis from ETOH. Renal function preserved. Lactic acid trending downward. Continue IVF at slightly slower rate  HLD: LFT's somewhat elevated. Statin discontinued. Will montor GERD: stable .   Code Status: full Family Communication: none present Disposition Plan: home when  ready   Consultants:  cardology  Procedures:  - Echo Left ventricle: The cavity size was normal. Wall thickness was normal. Systolic function was normal. The estimated ejection fraction was in the range of 55% to 60%. There is akinesis of the basalinferolateral myocardium. Features are consistent with a pseudonormal left ventricular filling pattern, with concomitant abnormal relaxation and increased filling pressure (grade 2 diastolic dysfunction).  Antibiotics:  none  HPI/Subjective: Awake. Denies pain/chest pain/sob or palpitations. Reports sleeping well  Objective: Filed Vitals:   10/16/14 0705  BP:   Pulse:   Temp: 97.9 F (36.6 C)  Resp: 24    Intake/Output Summary (Last 24 hours) at 10/16/14 0929 Last data filed at 10/16/14 0902  Gross per 24 hour  Intake 2447.5 ml  Output   2725 ml  Net -277.5 ml   Filed Weights   10/15/14 1610 10/16/14 0705  Weight: 94.348 kg (208 lb) 99.6 kg (219 lb 9.3 oz)    Exam:   General:  Well nourished appears comfortable  Cardiovascular: tachycardic irregular no MGR no LE edema  Respiratory: normal effort BS clear bilaterally no wheeze  Abdomen: non-distended non-tender with +BS  Musculoskeletal: joints without swelling/erythema   Data Reviewed: Basic Metabolic Panel:  Recent Labs Lab 10/14/14 1654 10/15/14 0641 10/16/14 0639  NA 129* 134* 138  K 4.1 4.2 3.9  CL 97* 101 107  CO2 16* 23 23  GLUCOSE 108* 118* 109*  BUN 8 9 8   CREATININE 1.04 1.09 1.09  CALCIUM 8.6* 8.3* 8.6*  MG 1.9  --   --    Liver Function Tests:  Recent Labs Lab 10/14/14 1654  AST 140*  ALT 98*  ALKPHOS 40  BILITOT 0.7  PROT 7.3  ALBUMIN 4.0   No  results for input(s): LIPASE, AMYLASE in the last 168 hours. No results for input(s): AMMONIA in the last 168 hours. CBC:  Recent Labs Lab 10/14/14 1654 10/15/14 0641 10/16/14 0639  WBC 7.3 5.3 4.6  NEUTROABS 4.6  --   --   HGB 15.3 12.8* 12.7*  HCT 43.8 37.8*  37.5*  MCV 97.8 99.2 99.5  PLT 169 141* 129*   Cardiac Enzymes:  Recent Labs Lab 10/14/14 1654 10/14/14 2315 10/15/14 0641 10/15/14 1107  TROPONINI <0.03 <0.03 <0.03 <0.03   BNP (last 3 results) No results for input(s): BNP in the last 8760 hours.  ProBNP (last 3 results) No results for input(s): PROBNP in the last 8760 hours.  CBG:  Recent Labs Lab 10/14/14 1632  GLUCAP 98    No results found for this or any previous visit (from the past 240 hour(s)).   Studies: Dg Chest 2 View  10/14/2014   CLINICAL DATA:  45-year-old male with lightheadedness.  EXAM: CHEST  2 VIEW  COMPARISON:  Chest radiograph dated 02/02/2012  FINDINGS: Two views of the chest demonstrate clear lungs. No pleural effusion or pneumothorax. Top-normal cardiac size. The osseous structures are grossly unremarkable.  IMPRESSION: No active cardiopulmonary disease.   Electronically Signed   By: Anner Crete M.D.   On: 10/14/2014 20:57   Ct Head Wo Contrast  10/14/2014   CLINICAL DATA:  Trauma. Laceration to right-sided forehead. Chronic lightheadedness. Ethanol dependence.  EXAM: CT HEAD WITHOUT CONTRAST  TECHNIQUE: Contiguous axial images were obtained from the base of the skull through the vertex without intravenous contrast.  COMPARISON:  09/10/2012  FINDINGS: Sinuses/Soft tissues: Right parietal scalp soft tissue swelling on image 32 of series 4. This is chronic. Mucosal thickening of ethmoid air cells and left sphenoid sinus. Clear mastoid air cells.  Intracranial: Expected cerebral volume loss for age. Cerebellar atrophy. No mass lesion, hemorrhage, hydrocephalus, acute infarct, intra-axial, or extra-axial fluid collection.  IMPRESSION: 1.  No acute intracranial abnormality. 2. Cerebral and cerebellar atrophy. 3. Sinus disease.   Electronically Signed   By: Abigail Miyamoto M.D.   On: 10/14/2014 17:22   US Carotid Bilateral  10/15/2014   ADDENDUM REPORT: 10/15/2014 16:29  ADDENDUM: Voice recognition error.   Items #1 and #2 in the IMPRESSION below should read 'Moderate (50-69%) stenosis' rather than 1-69%.   Electronically Signed   By: Jacqulynn Cadet M.D.   On: 10/15/2014 16:29   10/15/2014   CLINICAL DATA:  69 year old male with left-sided carotid bruit  EXAM: BILATERAL CAROTID DUPLEX ULTRASOUND  TECHNIQUE: Pearline Cables scale imaging, color Doppler and duplex ultrasound were performed of bilateral carotid and vertebral arteries in the neck.  COMPARISON:  None.  FINDINGS: Criteria: Quantification of carotid stenosis is based on velocity parameters that correlate the residual internal carotid diameter with NASCET-based stenosis levels, using the diameter of the distal internal carotid lumen as the denominator for stenosis measurement.  The following velocity measurements were obtained:  RIGHT  ICA:  160/32 cm/sec  CCA:  102/58 cm/sec  SYSTOLIC ICA/CCA RATIO:  1.4  DIASTOLIC ICA/CCA RATIO:  2.8  ECA:  90 cm/sec  LEFT  ICA:  158/27 cm/sec  CCA:  52/77 cm/sec  SYSTOLIC ICA/CCA RATIO:  1.7  DIASTOLIC ICA/CCA RATIO:  2.3  ECA:  124 cm/sec  RIGHT CAROTID ARTERY: Heterogeneous, irregular and partially calcified atherosclerotic plaque beginning in the mid common carotid artery and extending into the external and internal carotid arteries. By peak systolic velocity criteria there is an estimated 50- 69%  diameter stenosis.  RIGHT VERTEBRAL ARTERY:  Patent with normal antegrade flow.  LEFT CAROTID ARTERY: Heterogeneous atherosclerotic plaque beginning in the mid common carotid artery and extending into the proximal internal and external carotid arteries. By peak systolic velocities the estimated stenosis is 50- 69%.  LEFT VERTEBRAL ARTERY:  Complete flow reversal.  IMPRESSION: 1. Moderate (1-69%) stenosis proximal right internal carotid artery secondary to heterogeneous and highly irregular atherosclerotic plaque. 2. Moderate (1-69%) stenosis proximal left internal carotid artery secondary to heterogeneous atherosclerotic plaque. 3.  Flow reversal in the left vertebral artery suggests subclavian steal secondary to a significant stenosis in the left subclavian artery. If the patient has symptoms of left arm claudication, or vertebrobasilar symptoms then further evaluation by an endovascular specialist may be warranted. 4. Normal antegrade flow in the right vertebral artery. Signed,  Criselda Peaches, MD  [facility]  [facility]  Electronically Signed: By: Jacqulynn Cadet M.D. On: 10/15/2014 16:16    Scheduled Meds: . aspirin  325 mg Oral Once  . bacitracin   Topical BID  . diltiazem  10 mg Intravenous Once  . folic acid  1 mg Oral Daily  . heparin  5,000 Units Subcutaneous 3 times per day  . lidocaine (PF)  5 mL Infiltration Once  . loratadine  10 mg Oral Daily  . metoprolol succinate  25 mg Oral Daily  . multivitamin with minerals  1 tablet Oral Daily  . pantoprazole  40 mg Oral Daily  . sodium chloride  3 mL Intravenous Q12H  . thiamine  100 mg Oral Daily   Or  . thiamine  100 mg Intravenous Daily   Continuous Infusions: . sodium chloride 75 mL/hr at 10/15/14 2001  . amiodarone 60 mg/hr (10/16/14 0842)   Followed by  . amiodarone    . diltiazem (CARDIZEM) infusion 5 mg/hr (10/16/14 0902)    Principal Problem:   Syncope Active Problems:   HLD (hyperlipidemia)   GERD   ETOH abuse   Hyponatremia   Metabolic acidosis   Chest pain   Laceration of head   Malnutrition   Bruit of left carotid artery   Dehydration   Thrombocytopenia   Atrial flutter   Elevated LFTs   Subclavian steal syndrome   Subclavian artery stenosis, left   Diastolic dysfunction   Carotid stenosis    Time spent: 35 minutes    [place] [facility] Pager [phone]. If 7PM-7AM, please contact night-coverage at [email or web address], password [facility] 10/16/2014, 9:29 AM  LOS: 2 days

## 2014-10-16 NOTE — Progress Notes (Signed)
RN called the telemetry showed that patient went into  atrial flutter. EKG done this morning again shows atrial flutter with variable block. We'll transfer the patient to stepdown and start Cardizem drip at 5 mg per hour. Titrate to keep the heart rate less than 100. Patient was earlier seen by cardiology yesterday, his CHADS vasc score is 1 and not a candidate for anticoagulation therapy. We'll give 1 dose of aspirin 325 mg by mouth 1

## 2014-10-16 NOTE — Care Management (Signed)
Important Message  Patient Details  Name: Victor Diaz MRN: [id number] Date of Birth: 02/05/1946   Medicare Important Message Given:  Yes-second notification given    Sherald Barge, RN 10/16/2014, 4:12 PM

## 2014-10-16 NOTE — Care Management Note (Signed)
Case Management Note  Patient Details  Name: Victor Diaz MRN: [id number] Date of Birth: 01/30/1946  Expected Discharge Date:                  Expected Discharge Plan:  Home/Self Care  In-House Referral:  NA  Discharge planning Services  CM Consult  Post Acute Care Choice:  NA Choice offered to:  NA  DME Arranged:    DME Agency:     HH Arranged:    [place] Agency:     Status of Service:  Completed, signed off  Medicare Important Message Given:  Yes-second notification given Date Medicare IM Given:    Medicare IM give by:    Date Additional Medicare IM Given:    Additional Medicare Important Message give by:     If discussed at [place] of Stay Meetings, dates discussed:    Additional Comments: Possible discharge over weekend. Pt has refused OP PT. No CM needs anticipated.  Sherald Barge, RN 10/16/2014, 4:13 PM

## 2014-10-17 LAB — CBC
HEMATOCRIT: 37.5 % — AB (ref 39.0–52.0)
Hemoglobin: 12.5 g/dL — ABNORMAL LOW (ref 13.0–17.0)
MCH: 33.5 pg (ref 26.0–34.0)
MCHC: 33.3 g/dL (ref 30.0–36.0)
MCV: 100.5 fL — ABNORMAL HIGH (ref 78.0–100.0)
Platelets: 147 10*3/uL — ABNORMAL LOW (ref 150–400)
RBC: 3.73 MIL/uL — AB (ref 4.22–5.81)
RDW: 13.5 % (ref 11.5–15.5)
WBC: 5.4 10*3/uL (ref 4.0–10.5)

## 2014-10-17 LAB — COMPREHENSIVE METABOLIC PANEL
ALBUMIN: 3.3 g/dL — AB (ref 3.5–5.0)
ALT: 46 U/L (ref 17–63)
AST: 53 U/L — ABNORMAL HIGH (ref 15–41)
Alkaline Phosphatase: 30 U/L — ABNORMAL LOW (ref 38–126)
Anion gap: 7 (ref 5–15)
BUN: 8 mg/dL (ref 6–20)
CALCIUM: 8.6 mg/dL — AB (ref 8.9–10.3)
CO2: 23 mmol/L (ref 22–32)
CREATININE: 1.1 mg/dL (ref 0.61–1.24)
Chloride: 109 mmol/L (ref 101–111)
GFR calc Af Amer: >60 mL/min (ref >60)
GFR calc non Af Amer: >60 mL/min (ref >60)
GLUCOSE: 101 mg/dL — AB (ref 65–99)
POTASSIUM: 4 mmol/L (ref 3.5–5.1)
Sodium: 139 mmol/L (ref 135–145)
Total Bilirubin: 0.3 mg/dL (ref 0.3–1.2)
Total Protein: 6.2 g/dL — ABNORMAL LOW (ref 6.5–8.1)

## 2014-10-17 MED ORDER — METOPROLOL SUCCINATE ER 25 MG PO TB24
25.0000 mg | ORAL_TABLET | ORAL | Status: AC
  Administered 2014-10-17: 25 mg via ORAL
  Filled 2014-10-17: qty 1

## 2014-10-17 MED ORDER — METOPROLOL SUCCINATE ER 50 MG PO TB24
50.0000 mg | ORAL_TABLET | Freq: Every day | ORAL | Status: DC
  Administered 2014-10-18: 50 mg via ORAL
  Filled 2014-10-17: qty 1

## 2014-10-17 NOTE — Progress Notes (Signed)
[facility] PROGRESS NOTE  Victor Diaz NAT:[id number] DOB: 08/15/1945 DOA: 10/14/2014 PCP: Dorothyann Peng, NP  Assessment/Plan:  Aflutter: started on amiodarone infusion and has since converted back to sinus rhythm. Denies pain/palpitation/sob. Not a good candidate for anticoagulation. Will discontinue amiodarone infusion and increase toprol dosing. Continue to monitor on telemetry for recurrence.  Syncope/subclavian artery stenosis/subclavian steel: per [place]. Likely related to significant left subclavian artery stenosis resulting in flow reversal in left vertebral artery suggesting subclavian steal. Carotid dopplar show moderate stenosis bilaterally as well. Echo with normal EF and some wall motion abnormalities. Recent Holter monitor showing intermittent Afib, RVR, and few SVT. CT head w/o acute process noted. Orthostatic VS neg. No events on tele. TSH 3.4 and A1c 5.6.  Will need to follow up with vascular surgery   ETOH abuse: AST140 ALT 98, Alcohol level 75. Baseline consumption of 12pk Beer p/day. - CIWA. UDS negative. continue Thiamine, Folate, MV. No s/sx withdrawal at present  Chest pain: denies CP. Troponin neg x 3. EKG w/o evidence of ACS. Arrythmia on Holter monitor in past but asymptomatic at that time. CXR w/o acute process. Evaluated by cardiology. See above.   Head Lac: repaired by ED physician.  - ABX ointment daily - F/u for suture removal in 7-10 days  Hyponatremia: resolved this am. Likely related to malnutrition and ETOH use improving with IVF  Metabolic Acidosis: anticipate lactic acidosis from ETOH. Renal function preserved. Lactic acid resolved with IV fluids.   HLD: LFT's somewhat elevated. Statin discontinued. LFTs trending down. Will montor  GERD: stable .   Code Status: full Family Communication: none present Disposition Plan: home when ready   Consultants:  cardology  Procedures:  - Echo Left ventricle: The cavity size was normal. Wall  thickness was normal. Systolic function was normal. The estimated ejection fraction was in the range of 55% to 60%. There is akinesis of the basalinferolateral myocardium. Features are consistent with a pseudonormal left ventricular filling pattern, with concomitant abnormal relaxation and increased filling pressure (grade 2 diastolic dysfunction).  Antibiotics:  none  HPI/Subjective: No chest pain or shortness of breath. No new complaints  Objective: Filed Vitals:   10/17/14 1000  BP: 114/70  Pulse: 78  Temp:   Resp: 20    Intake/Output Summary (Last 24 hours) at 10/17/14 1048 Last data filed at 10/17/14 0930  Gross per 24 hour  Intake 2342.22 ml  Output   2275 ml  Net  67.22 ml   Filed Weights   10/15/14 1610 10/16/14 0705 10/17/14 0539  Weight: 94.348 kg (208 lb) 99.6 kg (219 lb 9.3 oz) 96 kg (211 lb 10.3 oz)    Exam:   General:  Well nourished appears comfortable  Cardiovascular: s1, s2, rrr no MGR no LE edema  Respiratory: normal effort BS clear bilaterally no wheeze  Abdomen: non-distended non-tender with +BS  Musculoskeletal: no edema  Data Reviewed: Basic Metabolic Panel:  Recent Labs Lab 10/14/14 1654 10/15/14 0641 10/16/14 0639 10/17/14 0457  NA 129* 134* 138 139  K 4.1 4.2 3.9 4.0  CL 97* 101 107 109  CO2 16* 23 23 23   GLUCOSE 108* 118* 109* 101*  BUN 8 9 8 8   CREATININE 1.04 1.09 1.09 1.10  CALCIUM 8.6* 8.3* 8.6* 8.6*  MG 1.9  --   --   --    Liver Function Tests:  Recent Labs Lab 10/14/14 1654 10/17/14 0457  AST 140* 53*  ALT 98* 46  ALKPHOS 40 30*  BILITOT 0.7  0.3  PROT 7.3 6.2*  ALBUMIN 4.0 3.3*   No results for input(s): LIPASE, AMYLASE in the last 168 hours. No results for input(s): AMMONIA in the last 168 hours. CBC:  Recent Labs Lab 10/14/14 1654 10/15/14 0641 10/16/14 0639 10/17/14 0457  WBC 7.3 5.3 4.6 5.4  NEUTROABS 4.6  --   --   --   HGB 15.3 12.8* 12.7* 12.5*  HCT 43.8 37.8* 37.5* 37.5*   MCV 97.8 99.2 99.5 100.5*  PLT 169 141* 129* 147*   Cardiac Enzymes:  Recent Labs Lab 10/14/14 1654 10/14/14 2315 10/15/14 0641 10/15/14 1107  TROPONINI <0.03 <0.03 <0.03 <0.03   BNP (last 3 results) No results for input(s): BNP in the last 8760 hours.  ProBNP (last 3 results) No results for input(s): PROBNP in the last 8760 hours.  CBG:  Recent Labs Lab 10/14/14 1632  GLUCAP 98    Recent Results (from the past 240 hour(s))  MRSA PCR Screening     Status: None   Collection Time: 10/16/14  6:55 AM  Result Value Ref Range Status   MRSA by PCR NEGATIVE NEGATIVE Final    Comment:        The GeneXpert MRSA Assay (FDA approved for NASAL specimens only), is one component of a comprehensive MRSA colonization surveillance program. It is not intended to diagnose MRSA infection nor to guide or monitor treatment for MRSA infections.      Studies: US Carotid Bilateral  10/15/2014   ADDENDUM REPORT: 10/15/2014 16:29  ADDENDUM: Voice recognition error.  Items #1 and #2 in the IMPRESSION below should read 'Moderate (50-69%) stenosis' rather than 1-69%.   Electronically Signed   By: Jacqulynn Cadet M.D.   On: 10/15/2014 16:29   10/15/2014   CLINICAL DATA:  69 year old male with left-sided carotid bruit  EXAM: BILATERAL CAROTID DUPLEX ULTRASOUND  TECHNIQUE: Pearline Cables scale imaging, color Doppler and duplex ultrasound were performed of bilateral carotid and vertebral arteries in the neck.  COMPARISON:  None.  FINDINGS: Criteria: Quantification of carotid stenosis is based on velocity parameters that correlate the residual internal carotid diameter with NASCET-based stenosis levels, using the diameter of the distal internal carotid lumen as the denominator for stenosis measurement.  The following velocity measurements were obtained:  RIGHT  ICA:  160/32 cm/sec  CCA:  099/83 cm/sec  SYSTOLIC ICA/CCA RATIO:  1.4  DIASTOLIC ICA/CCA RATIO:  2.8  ECA:  90 cm/sec  LEFT  ICA:  158/27 cm/sec  CCA:   38/25 cm/sec  SYSTOLIC ICA/CCA RATIO:  1.7  DIASTOLIC ICA/CCA RATIO:  2.3  ECA:  124 cm/sec  RIGHT CAROTID ARTERY: Heterogeneous, irregular and partially calcified atherosclerotic plaque beginning in the mid common carotid artery and extending into the external and internal carotid arteries. By peak systolic velocity criteria there is an estimated 50- 69% diameter stenosis.  RIGHT VERTEBRAL ARTERY:  Patent with normal antegrade flow.  LEFT CAROTID ARTERY: Heterogeneous atherosclerotic plaque beginning in the mid common carotid artery and extending into the proximal internal and external carotid arteries. By peak systolic velocities the estimated stenosis is 50- 69%.  LEFT VERTEBRAL ARTERY:  Complete flow reversal.  IMPRESSION: 1. Moderate (1-69%) stenosis proximal right internal carotid artery secondary to heterogeneous and highly irregular atherosclerotic plaque. 2. Moderate (1-69%) stenosis proximal left internal carotid artery secondary to heterogeneous atherosclerotic plaque. 3. Flow reversal in the left vertebral artery suggests subclavian steal secondary to a significant stenosis in the left subclavian artery. If the patient has symptoms of left arm  claudication, or vertebrobasilar symptoms then further evaluation by an endovascular specialist may be warranted. 4. Normal antegrade flow in the right vertebral artery. Signed,  Criselda Peaches, MD  [facility]  [facility]  Electronically Signed: By: Jacqulynn Cadet M.D. On: 10/15/2014 16:16    Scheduled Meds: . aspirin  325 mg Oral Once  . bacitracin   Topical BID  . diltiazem  10 mg Intravenous Once  . folic acid  1 mg Oral Daily  . heparin  5,000 Units Subcutaneous 3 times per day  . lidocaine (PF)  5 mL Infiltration Once  . loratadine  10 mg Oral Daily  . metoprolol succinate  25 mg Oral NOW  . [START ON 10/18/2014] metoprolol succinate  50 mg Oral Daily  . multivitamin with minerals  1 tablet  Oral Daily  . pantoprazole  40 mg Oral Daily  . polyethylene glycol  17 g Oral Daily  . sodium chloride  3 mL Intravenous Q12H  . thiamine  100 mg Oral Daily   Or  . thiamine  100 mg Intravenous Daily   Continuous Infusions: . sodium chloride 75 mL/hr at 10/17/14 0303    Principal Problem:   Syncope Active Problems:   HLD (hyperlipidemia)   GERD   ETOH abuse   Hyponatremia   Metabolic acidosis   Chest pain   Laceration of head   Malnutrition   Bruit of left carotid artery   Dehydration   Thrombocytopenia   Atrial flutter   Elevated LFTs   Subclavian steal syndrome   Subclavian artery stenosis, left   Diastolic dysfunction   Carotid stenosis    Time spent: 35 minutes    [place] [facility] Pager [phone]. If 7PM-7AM, please contact night-coverage at [email or web address], password [facility] 10/17/2014, 10:48 AM  LOS: 3 days

## 2014-10-17 NOTE — Progress Notes (Signed)
Changed Gause on forehead. Used 10 ml saline flush to wash area, wiped forehead dry and applied new gause.

## 2014-10-17 NOTE — Progress Notes (Signed)
Victor Diaz walked twice around floor of both nursing stations. Heart rate stayed under 100 and oxygen saturation remained 98 % on room air. Tolerated well.

## 2014-10-18 MED ORDER — LORAZEPAM 2 MG/ML IJ SOLN
2.0000 mg | Freq: Once | INTRAMUSCULAR | Status: AC
  Administered 2014-10-18: 2 mg via INTRAVENOUS
  Filled 2014-10-18: qty 1

## 2014-10-18 MED ORDER — ASPIRIN EC 325 MG PO TBEC: 325.0000 mg | DELAYED_RELEASE_TABLET | Freq: Every day | ORAL | Status: DC

## 2014-10-18 MED ORDER — DIAZEPAM 2 MG PO TABS: 2.0000 mg | ORAL_TABLET | Freq: Every day | ORAL | Status: DC | PRN

## 2014-10-18 MED ORDER — METOPROLOL SUCCINATE ER 25 MG PO TB24: 75.0000 mg | ORAL_TABLET | Freq: Every day | ORAL | Status: DC

## 2014-10-18 MED ORDER — METOPROLOL SUCCINATE ER 50 MG PO TB24
75.0000 mg | ORAL_TABLET | Freq: Every day | ORAL | Status: DC
  Administered 2014-10-18: 75 mg via ORAL
  Filled 2014-10-18 (×2): qty 1

## 2014-10-18 NOTE — Discharge Summary (Signed)
Physician Discharge Summary  Victor Diaz:[id number] DOB: [birth year] DOA: 10/14/2014  PCP: Dorothyann Peng, NP  Admit date: 10/14/2014 Discharge date: 10/18/2014  Time spent: 40 minutes  Recommendations for Outpatient Follow-up:  1. Follow up with primary care physician in 1-2 weeks 2. Follow up with vascular surgery, Dr. Trula Slade in 1 week 3. Follow up with cardiology  Discharge Diagnoses:  Principal Problem:   Syncope Active Problems:   HLD (hyperlipidemia)   GERD   ETOH abuse   Hyponatremia   Metabolic acidosis   Chest pain   Laceration of head   Malnutrition   Bruit of left carotid artery   Dehydration   Thrombocytopenia   Atrial flutter   Elevated LFTs   Subclavian steal syndrome   Subclavian artery stenosis, left   Diastolic dysfunction   Carotid stenosis   Discharge Condition: improved  Diet recommendation: low salt  Filed Weights   10/16/14 0705 10/17/14 0539 10/18/14 0500  Weight: 99.6 kg (219 lb 9.3 oz) 96 kg (211 lb 10.3 oz) 95.9 kg (211 lb 6.7 oz)    History of present illness:  This patient was admitted to the hospital after an episode of syncope. He was in the shower on the day of admission began to feel dizzy. He felt that he was going to pass out and went to sit down the toilet. He then woke up on the floor. He suffered a laceration on his forehead that was repaired in the emergency room. He was admitted for further treatments.  Hospital Course:  1. Syncope. Etiology is not entirely clear. This is likely multifactorial. He was noted to be dehydrated on admission and received IV fluids. Further workup included carotid Dopplers which indicated evidence of left subclavian stenosis and retrograde flow in the left vertebral artery consistent with subclavian steal. He also recently had a event monitor that indicated episodes of SVT and atrial flutter. He was monitored in the hospital did not have any further episodes of syncope.  2. Possible subclavian  steal syndrome. Case was discussed with Dr. Trula Slade on call for vascular surgery. Patient also has evidence of bilateral carotid stenosis. He will follow-up with vascular surgery in the outpatient setting.  3. atrial flutter. Patient did have episodes of rapid atrial flutter during his hospital stay. He was seen by cardiology. He was briefly put on a amiodarone infusion and converted back to sinus rhythm. He was felt to be a poor candidate for anticoagulation due to his ongoing alcohol use. He is maintained on Toprol and has maintained sinus rhythm.  4. Alcohol abuse. Patient endorsed significant alcohol intake prior to admission. He did not have any evidence of withdrawal during his hospital stay. He's been advised to abstain from any further alcohol.  5. Head laceration. Repaired in the emergency room. Follow-up in the ER for suture removal.  6. Dehydration. Resolved with IV fluids.    Procedures: Echo Left ventricle: The cavity size was normal. Wall thickness was normal. Systolic function was normal. The estimated ejection fraction was in the range of 55% to 60%. There is akinesis of the basalinferolateral myocardium. Features are consistent with a pseudonormal left ventricular filling pattern, with concomitant abnormal relaxation and increased filling pressure (grade 2 diastolic dysfunction).  Consultations:  cardiology  Discharge Exam: Filed Vitals:   10/18/14 0900  BP: 130/74  Pulse: 89  Temp:   Resp: 19    General: NAD Cardiovascular: S1, S2 RRR Respiratory: CTA B  Discharge Instructions   Discharge Instructions  Diet - low sodium heart healthy    Complete by:  As directed      Increase activity slowly    Complete by:  As directed           Discharge Medication List as of 10/18/2014  9:25 AM    START taking these medications   Details  aspirin EC 325 MG tablet Take 1 tablet (325 mg total) by mouth daily., Starting 10/18/2014, Until Discontinued,  Normal    metoprolol succinate (TOPROL-XL) 25 MG 24 hr tablet Take 3 tablets (75 mg total) by mouth daily., Starting 10/18/2014, Until Discontinued, Normal      CONTINUE these medications which have CHANGED   Details  diazepam (VALIUM) 2 MG tablet Take 1-2 tablets (2-4 mg total) by mouth daily as needed for anxiety. Take 1/2 tablet by mouth daily prn., Starting 10/18/2014, Until Discontinued, No Print      CONTINUE these medications which have NOT CHANGED   Details  cetirizine (ZYRTEC) 10 MG tablet Take 10 mg by mouth daily as needed for allergies., Until Discontinued, Historical Med    omeprazole (PRILOSEC) 20 MG capsule TAKE 1 CAPSULE BY MOUTH DAILY AS NEEDED FOR HEART BURN., Normal    simvastatin (ZOCOR) 40 MG tablet TAKE (1) TABLET BY MOUTH AT BEDTIME., Normal       Allergies  Allergen Reactions  . Atorvastatin Anaphylaxis    facial swelling   Follow-up Information    Follow up with Annamarie Major, MD.   Specialties:  Vascular Surgery, Cardiology   Why:  call their office next week for appointment   Contact information:   [address] [place] [address] [phone]       Please follow up.   Why:  return to ED in 5 days for removal of stitches      Follow up with Dorothyann Peng, NP. Schedule an appointment as soon as possible for a visit in 2 weeks.   Specialty:  Family Medicine   Contact information:   [address] [place] [address] [phone]       Follow up with Rozann Lesches, MD.   Specialty:  Cardiology   Why:  call next week for follow up appointment   Contact information:   [place] [address] [phone]        The results of significant diagnostics from this hospitalization (including imaging, microbiology, ancillary and laboratory) are listed below for reference.    Significant Diagnostic Studies: Dg Chest 2 View  10/14/2014   CLINICAL DATA:  69-year-old male with lightheadedness.  EXAM: CHEST  2 VIEW   COMPARISON:  Chest radiograph dated 02/02/2012  FINDINGS: Two views of the chest demonstrate clear lungs. No pleural effusion or pneumothorax. Top-normal cardiac size. The osseous structures are grossly unremarkable.  IMPRESSION: No active cardiopulmonary disease.   Electronically Signed   By: Anner Crete M.D.   On: 10/14/2014 20:57   Ct Head Wo Contrast  10/14/2014   CLINICAL DATA:  Trauma. Laceration to right-sided forehead. Chronic lightheadedness. Ethanol dependence.  EXAM: CT HEAD WITHOUT CONTRAST  TECHNIQUE: Contiguous axial images were obtained from the base of the skull through the vertex without intravenous contrast.  COMPARISON:  09/10/2012  FINDINGS: Sinuses/Soft tissues: Right parietal scalp soft tissue swelling on image 32 of series 4. This is chronic. Mucosal thickening of ethmoid air cells and left sphenoid sinus. Clear mastoid air cells.  Intracranial: Expected cerebral volume loss for age. Cerebellar atrophy. No mass lesion, hemorrhage, hydrocephalus,  acute infarct, intra-axial, or extra-axial fluid collection.  IMPRESSION: 1.  No acute intracranial abnormality. 2. Cerebral and cerebellar atrophy. 3. Sinus disease.   Electronically Signed   By: Abigail Miyamoto M.D.   On: 10/14/2014 17:22   US Carotid Bilateral  10/15/2014   ADDENDUM REPORT: 10/15/2014 16:29  ADDENDUM: Voice recognition error.  Items #1 and #2 in the IMPRESSION below should read 'Moderate (50-69%) stenosis' rather than 1-69%.   Electronically Signed   By: Jacqulynn Cadet M.D.   On: 10/15/2014 16:29   10/15/2014   CLINICAL DATA:  69 year old male with left-sided carotid bruit  EXAM: BILATERAL CAROTID DUPLEX ULTRASOUND  TECHNIQUE: Pearline Cables scale imaging, color Doppler and duplex ultrasound were performed of bilateral carotid and vertebral arteries in the neck.  COMPARISON:  None.  FINDINGS: Criteria: Quantification of carotid stenosis is based on velocity parameters that correlate the residual internal carotid diameter with  NASCET-based stenosis levels, using the diameter of the distal internal carotid lumen as the denominator for stenosis measurement.  The following velocity measurements were obtained:  RIGHT  ICA:  160/32 cm/sec  CCA:  161/09 cm/sec  SYSTOLIC ICA/CCA RATIO:  1.4  DIASTOLIC ICA/CCA RATIO:  2.8  ECA:  90 cm/sec  LEFT  ICA:  158/27 cm/sec  CCA:  60/45 cm/sec  SYSTOLIC ICA/CCA RATIO:  1.7  DIASTOLIC ICA/CCA RATIO:  2.3  ECA:  124 cm/sec  RIGHT CAROTID ARTERY: Heterogeneous, irregular and partially calcified atherosclerotic plaque beginning in the mid common carotid artery and extending into the external and internal carotid arteries. By peak systolic velocity criteria there is an estimated 50- 69% diameter stenosis.  RIGHT VERTEBRAL ARTERY:  Patent with normal antegrade flow.  LEFT CAROTID ARTERY: Heterogeneous atherosclerotic plaque beginning in the mid common carotid artery and extending into the proximal internal and external carotid arteries. By peak systolic velocities the estimated stenosis is 50- 69%.  LEFT VERTEBRAL ARTERY:  Complete flow reversal.  IMPRESSION: 1. Moderate (1-69%) stenosis proximal right internal carotid artery secondary to heterogeneous and highly irregular atherosclerotic plaque. 2. Moderate (1-69%) stenosis proximal left internal carotid artery secondary to heterogeneous atherosclerotic plaque. 3. Flow reversal in the left vertebral artery suggests subclavian steal secondary to a significant stenosis in the left subclavian artery. If the patient has symptoms of left arm claudication, or vertebrobasilar symptoms then further evaluation by an endovascular specialist may be warranted. 4. Normal antegrade flow in the right vertebral artery. Signed,  Criselda Peaches, MD  [facility]  [facility]  Electronically Signed: By: Jacqulynn Cadet M.D. On: 10/15/2014 16:16    Microbiology: Recent Results (from the past 240 hour(s))  MRSA PCR  Screening     Status: None   Collection Time: 10/16/14  6:55 AM  Result Value Ref Range Status   MRSA by PCR NEGATIVE NEGATIVE Final    Comment:        The GeneXpert MRSA Assay (FDA approved for NASAL specimens only), is one component of a comprehensive MRSA colonization surveillance program. It is not intended to diagnose MRSA infection nor to guide or monitor treatment for MRSA infections.      Labs: Basic Metabolic Panel:  Recent Labs Lab 10/14/14 1654 10/15/14 0641 10/16/14 0639 10/17/14 0457  NA 129* 134* 138 139  K 4.1 4.2 3.9 4.0  CL 97* 101 107 109  CO2 16* 23 23 23   GLUCOSE 108* 118* 109* 101*  BUN 8 9 8 8   CREATININE 1.04 1.09 1.09 1.10  CALCIUM 8.6*  8.3* 8.6* 8.6*  MG 1.9  --   --   --    Liver Function Tests:  Recent Labs Lab 10/14/14 1654 10/17/14 0457  AST 140* 53*  ALT 98* 46  ALKPHOS 40 30*  BILITOT 0.7 0.3  PROT 7.3 6.2*  ALBUMIN 4.0 3.3*   No results for input(s): LIPASE, AMYLASE in the last 168 hours. No results for input(s): AMMONIA in the last 168 hours. CBC:  Recent Labs Lab 10/14/14 1654 10/15/14 0641 10/16/14 0639 10/17/14 0457  WBC 7.3 5.3 4.6 5.4  NEUTROABS 4.6  --   --   --   HGB 15.3 12.8* 12.7* 12.5*  HCT 43.8 37.8* 37.5* 37.5*  MCV 97.8 99.2 99.5 100.5*  PLT 169 141* 129* 147*   Cardiac Enzymes:  Recent Labs Lab 10/14/14 1654 10/14/14 2315 10/15/14 0641 10/15/14 1107  TROPONINI <0.03 <0.03 <0.03 <0.03   BNP: BNP (last 3 results) No results for input(s): BNP in the last 8760 hours.  ProBNP (last 3 results) No results for input(s): PROBNP in the last 8760 hours.  CBG:  Recent Labs Lab 10/14/14 1632  GLUCAP 98       Signed:  MEMON,JEHANZEB  [facility] 10/18/2014, 5:06 PM

## 2014-10-18 NOTE — Progress Notes (Signed)
Patient was discharged this morning. Given all pills and medications scheduled for this am. Son came for transport and Claiborne Billings NT took patient down in wheelchair.

## 2014-10-22 ENCOUNTER — Encounter: Payer: Self-pay | Admitting: Surgery

## 2014-10-22 ENCOUNTER — Telehealth: Payer: Self-pay | Admitting: Adult Health

## 2014-10-22 NOTE — Telephone Encounter (Signed)
Pt went [place] on 6-30 and had carotid and echocardiogram . Pt would like to know should he kept cardiology appt. Also pt is sch to see vascular md in [place].concerning carotid

## 2014-10-22 NOTE — Telephone Encounter (Signed)
Per [name]'s request called pt to advised that Victor Diaz would like to see him for a hospital follow up.  Left a message for pt to call the office to set up an appointment.

## 2014-10-23 NOTE — Telephone Encounter (Signed)
I will not refill. He has a thirty day supply from his most recent hospital visit. He needs to ween off those. I do not feel comfortable prescribing them anymore due to his most recent fall. He has had multiple months to start tapering off them and we have discussed this in the past.

## 2014-10-23 NOTE — Telephone Encounter (Signed)
Called and spoke with pt and pt is aware of recommendations.

## 2014-10-23 NOTE — Telephone Encounter (Signed)
Spoke with pt and advised that he needs to have a hosptial follow up with [facility].  Pt verbalized understanding and states he has to speak with his son because he is his transportation at this time.  Pt asked about a refill of diazepam.  Advised pt that the chart states that pt was given diazepam 2 m #30 by the hospital.  Also advised pt that [facility] last refilled this medication and wants pt to slowly wean off.  Pt states he understands and would still like to know if Tommi Rumps would refill it once more.

## 2014-10-26 ENCOUNTER — Encounter: Payer: Self-pay | Admitting: Surgery

## 2014-10-26 ENCOUNTER — Ambulatory Visit (INDEPENDENT_AMBULATORY_CARE_PROVIDER_SITE_OTHER): Payer: Medicare Other | Admitting: Surgery

## 2014-10-26 VITALS — BP 116/71 | HR 66 | Temp 98.1°F | Resp 20 | Ht 66.0 in | Wt 206.0 lb

## 2014-10-26 DIAGNOSIS — G458 Other transient cerebral ischemic attacks and related syndromes: Secondary | ICD-10-CM

## 2014-10-26 NOTE — Addendum Note (Signed)
Addended by: Dorthula Rue L on: 10/26/2014 02:05 PM   Modules accepted: Orders

## 2014-10-26 NOTE — Progress Notes (Signed)
[name] MRN: [id number] DOB: Apr 05, 1946 Sex: male   Referred by: ER  Reason for referral:  Chief Complaint  Patient presents with  . New Evaluation    episode of syncopy; evaluate for (L) subclavian occlusion    HISTORY OF PRESENT ILLNESS: The patient is here today for evaluation of possible subclavian steal syndrome.  He was recently admitted to the hospital after an episode of syncope.  He was in the shower when he began to feel dizzy.  He sat down and later woke up on the floor.  He did have a laceration in the 4 head which required stitches.  The etiology of his syncopal episode was unclear.  He was found to be dehydrated in the hospital which was treated with IV fluids.  He had a cardiac event monitor that showed episodes of SVT as well as atrial flutter.  He was seen by cardiology and initially treated with amiodarone infusion which converted him back to sinus.  He is not a candidate for anticoagulation given his ongoing alcohol abuse.  He was treated with metoprolol at discharge.  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  The patient denies any significant left arm weakness.  It is unclear whether or not his left arm activity caused his syncopal episode.  The patient does suffer from ongoing alcohol abuse.  He suffers from hypercholesterolemia which is been managed with a statin.  He is a former smoker  Past Medical History  Diagnosis Date  . Allergy   . Gout   . Hyperlipidemia   . EtOH dependence   . Hemothorax on right   . Lisfranc's dislocation 10/20/2011  . Right fibular fracture 10/20/2011    Past Surgical History  Procedure Laterality Date  . Appendectomy    . Hernia repair    . Tonsilectomy, adenoidectomy, bilateral myringotomy and tubes    . 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 Partial hip arthroplasty Left 2010    History   Social History  . Marital Status:  Widowed    Spouse Name: N/A  . Number of Children: N/A  . Years of Education: N/A   Occupational History  . Not on file.   Social History Main Topics  . Smoking status: Former Smoker -- 1.00 packs/day for 10 years    Types: Cigarettes    Quit date: 08/09/1969  . Smokeless tobacco: Never Used  . Alcohol Use: 42.0 oz/week    70 Cans of beer per week     Comment: Daily  . Drug Use: No  . Sexual Activity: Not on file   Other Topics Concern  . Not on file   Social History Narrative   Lives in [place],  Has two sons who live with him   He is no longer working - lost his license.    He does not have a valid driver's license because of alcohol-related in fractions      Son drives him ;has MS          Family History  Problem Relation Age of Onset  . Arthritis Mother   . Cerebral aneurysm Mother   . Tuberculosis Father     Allergies as of 10/26/2014 - Review Complete 10/26/2014  Allergen Reaction Noted  . Atorvastatin Anaphylaxis     Current Outpatient Prescriptions on File Prior to Visit  Medication Sig Dispense Refill  . aspirin EC 325 MG  tablet Take 1 tablet (325 mg total) by mouth daily. 30 tablet 0  . cetirizine (ZYRTEC) 10 MG tablet Take 10 mg by mouth daily as needed for allergies.    . diazepam (VALIUM) 2 MG tablet Take 1-2 tablets (2-4 mg total) by mouth daily as needed for anxiety. Take 1/2 tablet by mouth daily prn. 30 tablet 0  . metoprolol succinate (TOPROL-XL) 25 MG 24 hr tablet Take 3 tablets (75 mg total) by mouth daily. 90 tablet 1  . omeprazole (PRILOSEC) 20 MG capsule TAKE 1 CAPSULE BY MOUTH DAILY AS NEEDED FOR HEART BURN. 90 capsule 1  . simvastatin (ZOCOR) 40 MG tablet TAKE (1) TABLET BY MOUTH AT BEDTIME. 30 tablet 5   No current facility-administered medications on file prior to visit.     REVIEW OF SYSTEMS: Please see history of present illness, otherwise negative  PHYSICAL EXAMINATION:  Filed Vitals:   10/26/14 1304  BP: 116/71  Pulse:  66  Temp: 98.1 F (36.7 C)  TempSrc: Oral  Resp: 20  Height: 5\' 6"  (1.676 m)  Weight: 206 lb (93.441 kg)   Body mass index is 33.27 kg/(m^2). General: The patient appears their stated age.   HEENT:  No gross abnormalities Pulmonary: Respirations are non-labored Abdomen: Soft and non-tender  Musculoskeletal: There are no major deformities.   Neurologic: No focal weakness or paresthesias are detected, Skin: There are no ulcer or rashes noted. Psychiatric: The patient has normal affect. Cardiovascular: There is a regular rate and rhythm without significant murmur appreciated.  Nonpalpable left radial pulse, palpable radial pulse  Diagnostic Studies: I have reviewed his ultrasound which shows left subclavian occlusion   Assessment:  Syncope Plan: The exact etiology of his syncopal episode is unclear at this time there are multiple possible etiologies including dehydration, cardiac arrhythmia, and left subclavian steal syndrome.  The patient has not had any additional episodes since he was discharged from the hospital.  I'm still concerned that this could be related to his left subclavian occlusion.  I have encouraged the patient to try to provoke another episode by doing activity with his left arm to see if this causes dizziness.  He will try this over the course of the next several weeks.  I'm going to get a CT scan of the neck and chest for operative planning purposes so that when he returns if he was found to be symptomatic I can proceed with left subclavian revascularization     V. Leia Alf, M.D. [facility] Office: [phone] Pager:  [phone]

## 2014-11-05 ENCOUNTER — Encounter: Payer: Self-pay | Admitting: Surgery

## 2014-11-09 ENCOUNTER — Other Ambulatory Visit: Payer: Self-pay

## 2014-11-09 ENCOUNTER — Ambulatory Visit
Admission: RE | Admit: 2014-11-09 | Discharge: 2014-11-09 | Disposition: A | Discharge status: Home / Self Care | Payer: Medicare Other | Source: Ambulatory Visit | Attending: Surgery | Admitting: Surgery

## 2014-11-09 ENCOUNTER — Encounter: Payer: Self-pay | Admitting: Surgery

## 2014-11-09 ENCOUNTER — Ambulatory Visit (INDEPENDENT_AMBULATORY_CARE_PROVIDER_SITE_OTHER): Payer: Medicare Other | Admitting: Surgery

## 2014-11-09 VITALS — BP 102/69 | HR 68 | Temp 98.5°F | Resp 16 | Ht 66.0 in | Wt 210.0 lb

## 2014-11-09 DIAGNOSIS — G458 Other transient cerebral ischemic attacks and related syndromes: Secondary | ICD-10-CM | POA: Diagnosis not present

## 2014-11-09 MED ORDER — IOPAMIDOL (ISOVUE-370) INJECTION 76%
75.0000 mL | Freq: Once | INTRAVENOUS | Status: AC | PRN
  Administered 2014-11-09: 75 mL via INTRAVENOUS

## 2014-11-09 NOTE — Progress Notes (Signed)
[name] MRN: [id number] DOB: [birth year] Sex: male     Chief Complaint  Patient presents with  . New Evaluation    Pt. had CTA scan today Chest/Neck here for results.    HISTORY OF PRESENT ILLNESS: The patient is here today for evaluation of possible subclavian steal syndrome. He was recently admitted to the hospital after an episode of syncope. He was in the shower when he began to feel dizzy. He sat down and later woke up on the floor. He did have a laceration in the 4 head which required stitches. The etiology of his syncopal episode was unclear. He was found to be dehydrated in the hospital which was treated with IV fluids. He had a cardiac event monitor that showed episodes of SVT as well as atrial flutter. He was seen by cardiology and initially treated with amiodarone infusion which converted him back to sinus. He is not a candidate for anticoagulation given his ongoing alcohol abuse. He was treated with metoprolol at discharge. 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  The patient does suffer from ongoing alcohol abuse. He suffers from hypercholesterolemia which is been managed with a statin. He is a former smoker.   he will occasionally get dizzy. He did not use his left arm much since my last visit.  Past Medical History  Diagnosis Date  . Allergy   . Gout   . Hyperlipidemia   . EtOH dependence   . Hemothorax on right   . Lisfranc's dislocation 10/20/2011  . Right fibular fracture 10/20/2011    Past Surgical History  Procedure Laterality Date  . Appendectomy    . Hernia repair    . Tonsilectomy, adenoidectomy, bilateral myringotomy and tubes    . 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 Partial hip arthroplasty Left 2010    History   Social History  . Marital Status: Widowed    Spouse Name: N/A  . Number of Children: N/A  . Years of Education: N/A    Occupational History  . Not on file.   Social History Main Topics  . Smoking status: Former Smoker -- 1.00 packs/day for 10 years    Types: Cigarettes    Quit date: 08/09/1969  . Smokeless tobacco: Never Used  . Alcohol Use: 42.0 oz/week    70 Cans of beer per week     Comment: Daily  . Drug Use: No  . Sexual Activity: Not on file   Other Topics Concern  . Not on file   Social History Narrative   Lives in [place],  Has two sons who live with him   He is no longer working - lost his license.    He does not have a valid driver's license because of alcohol-related in fractions      Son drives him ;has MS          Family History  Problem Relation Age of Onset  . Arthritis Mother   . Cerebral aneurysm Mother   . Tuberculosis Father     Allergies as of 11/09/2014 - Review Complete 11/09/2014  Allergen Reaction Noted  . Atorvastatin Anaphylaxis     Current Outpatient Prescriptions on File Prior to Visit  Medication Sig Dispense Refill  . aspirin EC 325 MG tablet Take 1 tablet (325 mg total) by mouth daily. 30 tablet 0  . cetirizine (ZYRTEC) 10 MG tablet Take 10 mg  by mouth daily as needed for allergies.    . diazepam (VALIUM) 2 MG tablet Take 1-2 tablets (2-4 mg total) by mouth daily as needed for anxiety. Take 1/2 tablet by mouth daily prn. 30 tablet 0  . metoprolol succinate (TOPROL-XL) 25 MG 24 hr tablet Take 3 tablets (75 mg total) by mouth daily. 90 tablet 1  . omeprazole (PRILOSEC) 20 MG capsule TAKE 1 CAPSULE BY MOUTH DAILY AS NEEDED FOR HEART BURN. 90 capsule 1  . simvastatin (ZOCOR) 40 MG tablet TAKE (1) TABLET BY MOUTH AT BEDTIME. 30 tablet 5   No current facility-administered medications on file prior to visit.     REVIEW OF SYSTEMS:  see history of present illness otherwise negative  PHYSICAL EXAMINATION:   Vital signs are  Filed Vitals:   11/09/14 1333 11/09/14 1335  BP: 112/74 102/69  Pulse: 67 68  Temp:  98.5 F (36.9 C)  TempSrc:  Oral   Resp:  16  Height:  5\' 6"  (1.676 m)  Weight:  210 lb (95.255 kg)  SpO2:  96%   Body mass index is 33.91 kg/(m^2). General: The patient appears their stated age. HEENT:  No gross abnormalities Pulmonary:  Non labored breathing Musculoskeletal: There are no major deformities. Neurologic: No focal weakness or paresthesias are detected, Skin: There are no ulcer or rashes noted. Psychiatric: The patient has normal affect. Cardiovascular: There is a regular rate and rhythm without significant murmur appreciated.   Diagnostic Studies I have reviewed his CTA with the following results: Vascular Impression:  1. Eccentric mixed calcified and noncalcified atherosclerotic plaque results in a focal severe (approximately 70%) luminal narrowing involving the proximal aspect of the left subclavian artery - findings which when correlated with carotid Doppler ultrasound remain worrisome for subclavian steal syndrome. 2. Otherwise, scattered minimal amount of atherosclerotic plaque with a normal caliber thoracic aorta. No thoracic aortic dissection or periaortic stranding. 3. Coronary artery calcifications. Nonvascular Impression:  1. Punctate (approximately 6 mm) subpleural nodule within the left lower lobe common minimally increased since the 2013 examination, previously, 4 mm in diameter. As such, a follow-up chest CT at 6 - 12 months is recommended. This recommendation follows the consensus statement: Guidelines for Management of Small Pulmonary Nodules Detected on CT Scans: A Statement from the [place] as published in Radiology 2005;237:395-400. 2. Cholelithiasis without evidence of cholecystitis.  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 I believe that because the artery is not 100% occluded, that he would be a  candidate to have this repaired with a stent. I suspect that his syncopal episodes are multifactorial in etiology.  Subclavian steal is certainly within the differential.  After lengthy discussion we have decided to proceed with aortic arch angiography and possible stenting of his left subclavian artery stenosis.  This will be scheduled for Wednesday, July 27.  Eldridge Abrahams, M.D. [facility] Office: [phone] Pager:  [phone]

## 2014-11-11 ENCOUNTER — Other Ambulatory Visit: Payer: Self-pay | Admitting: *Deleted

## 2014-11-11 ENCOUNTER — Encounter (HOSPITAL_COMMUNITY): Payer: Self-pay | Admitting: Surgery

## 2014-11-11 ENCOUNTER — Ambulatory Visit (HOSPITAL_COMMUNITY)
Admission: RE | Admit: 2014-11-11 | Discharge: 2014-11-12 | Disposition: A | Discharge status: Home / Self Care | Payer: Medicare Other | Source: Ambulatory Visit | Attending: Surgery | Admitting: Surgery

## 2014-11-11 ENCOUNTER — Encounter (HOSPITAL_COMMUNITY)
Admission: RE | Disposition: A | Discharge status: Home / Self Care | Payer: Self-pay | Source: Ambulatory Visit | Attending: Surgery

## 2014-11-11 DIAGNOSIS — Z87891 Personal history of nicotine dependence: Secondary | ICD-10-CM | POA: Diagnosis not present

## 2014-11-11 DIAGNOSIS — E785 Hyperlipidemia, unspecified: Secondary | ICD-10-CM | POA: Insufficient documentation

## 2014-11-11 DIAGNOSIS — I483 Typical atrial flutter: Secondary | ICD-10-CM | POA: Diagnosis not present

## 2014-11-11 DIAGNOSIS — I771 Stricture of artery: Secondary | ICD-10-CM

## 2014-11-11 DIAGNOSIS — E780 Pure hypercholesterolemia: Secondary | ICD-10-CM | POA: Diagnosis not present

## 2014-11-11 DIAGNOSIS — I4892 Unspecified atrial flutter: Secondary | ICD-10-CM | POA: Insufficient documentation

## 2014-11-11 DIAGNOSIS — Z7982 Long term (current) use of aspirin: Secondary | ICD-10-CM | POA: Insufficient documentation

## 2014-11-11 DIAGNOSIS — F1020 Alcohol dependence, uncomplicated: Secondary | ICD-10-CM | POA: Insufficient documentation

## 2014-11-11 DIAGNOSIS — Z79899 Other long term (current) drug therapy: Secondary | ICD-10-CM | POA: Diagnosis not present

## 2014-11-11 DIAGNOSIS — G458 Other transient cerebral ischemic attacks and related syndromes: Secondary | ICD-10-CM

## 2014-11-11 DIAGNOSIS — I708 Atherosclerosis of other arteries: Secondary | ICD-10-CM | POA: Insufficient documentation

## 2014-11-11 HISTORY — PX: PERIPHERAL VASCULAR CATHETERIZATION: SHX172C

## 2014-11-11 LAB — POCT I-STAT, CHEM 8
BUN: 11 mg/dL (ref 6–20)
CREATININE: 1.1 mg/dL (ref 0.61–1.24)
Calcium, Ion: 1.13 mmol/L (ref 1.13–1.30)
Chloride: 93 mmol/L — ABNORMAL LOW (ref 101–111)
GLUCOSE: 95 mg/dL (ref 65–99)
HCT: 45 % (ref 39.0–52.0)
HEMOGLOBIN: 15.3 g/dL (ref 13.0–17.0)
Potassium: 4.2 mmol/L (ref 3.5–5.1)
Sodium: 126 mmol/L — ABNORMAL LOW (ref 135–145)
TCO2: 21 mmol/L (ref 0–100)

## 2014-11-11 LAB — POCT ACTIVATED CLOTTING TIME
Activated Clotting Time: 288 seconds
Activated Clotting Time: 184 seconds

## 2014-11-11 SURGERY — AORTIC ARCH ANGIOGRAPHY

## 2014-11-11 MED ORDER — ACETAMINOPHEN 650 MG RE SUPP: 325.0000 mg | RECTAL | Status: DC | PRN

## 2014-11-11 MED ORDER — SODIUM CHLORIDE 0.9 % IV SOLN
INTRAVENOUS | Status: DC
  Administered 2014-11-11: 08:00:00 via INTRAVENOUS

## 2014-11-11 MED ORDER — METOPROLOL SUCCINATE ER 50 MG PO TB24
50.0000 mg | ORAL_TABLET | Freq: Every day | ORAL | Status: DC
  Filled 2014-11-11: qty 1

## 2014-11-11 MED ORDER — DOCUSATE SODIUM 100 MG PO CAPS
100.0000 mg | ORAL_CAPSULE | Freq: Every day | ORAL | Status: DC
  Filled 2014-11-11: qty 1

## 2014-11-11 MED ORDER — HYDRALAZINE HCL 20 MG/ML IJ SOLN: 5.0000 mg | INTRAMUSCULAR | Status: DC | PRN

## 2014-11-11 MED ORDER — CLOPIDOGREL BISULFATE 75 MG PO TABS
ORAL_TABLET | ORAL | Status: DC | PRN
  Administered 2014-11-11: 300 mg via ORAL

## 2014-11-11 MED ORDER — METOPROLOL SUCCINATE ER 50 MG PO TB24: 75.0000 mg | ORAL_TABLET | Freq: Every day | ORAL | Status: DC

## 2014-11-11 MED ORDER — SODIUM CHLORIDE 0.9 % IV SOLN: INTRAVENOUS | Status: DC

## 2014-11-11 MED ORDER — ACETAMINOPHEN 325 MG PO TABS: 325.0000 mg | ORAL_TABLET | ORAL | Status: DC | PRN

## 2014-11-11 MED ORDER — PANTOPRAZOLE SODIUM 40 MG PO TBEC
40.0000 mg | DELAYED_RELEASE_TABLET | Freq: Every day | ORAL | Status: DC
  Administered 2014-11-11: 40 mg via ORAL
  Filled 2014-11-11: qty 1

## 2014-11-11 MED ORDER — MIDAZOLAM HCL 2 MG/2ML IJ SOLN
INTRAMUSCULAR | Status: AC
  Filled 2014-11-11: qty 2

## 2014-11-11 MED ORDER — HEPARIN SODIUM (PORCINE) 1000 UNIT/ML IJ SOLN
INTRAMUSCULAR | Status: DC | PRN
  Administered 2014-11-11: 2000 [IU] via INTRAVENOUS

## 2014-11-11 MED ORDER — FENTANYL CITRATE (PF) 100 MCG/2ML IJ SOLN
INTRAMUSCULAR | Status: AC
  Filled 2014-11-11: qty 2

## 2014-11-11 MED ORDER — CLOPIDOGREL BISULFATE 75 MG PO TABS
75.0000 mg | ORAL_TABLET | Freq: Every day | ORAL | Status: DC
  Filled 2014-11-11: qty 1

## 2014-11-11 MED ORDER — METOPROLOL TARTRATE 1 MG/ML IV SOLN
INTRAVENOUS | Status: AC
  Filled 2014-11-11: qty 5

## 2014-11-11 MED ORDER — PHENOL 1.4 % MT LIQD
1.0000 | OROMUCOSAL | Status: DC | PRN
  Filled 2014-11-11: qty 177

## 2014-11-11 MED ORDER — DIAZEPAM 2 MG PO TABS
2.0000 mg | ORAL_TABLET | Freq: Every day | ORAL | Status: DC | PRN
Start: 2014-11-11 — End: 2014-11-12
  Administered 2014-11-11: 2 mg via ORAL
  Filled 2014-11-11: qty 1

## 2014-11-11 MED ORDER — HEPARIN SODIUM (PORCINE) 1000 UNIT/ML IJ SOLN
INTRAMUSCULAR | Status: AC
  Filled 2014-11-11: qty 1

## 2014-11-11 MED ORDER — LIDOCAINE HCL (PF) 1 % IJ SOLN
INTRAMUSCULAR | Status: DC | PRN
  Administered 2014-11-11: 10:00:00
  Administered 2014-11-11: 5000 mL

## 2014-11-11 MED ORDER — ONDANSETRON HCL 4 MG/2ML IJ SOLN
4.0000 mg | Freq: Four times a day (QID) | INTRAMUSCULAR | Status: DC | PRN
Start: 2014-11-11 — End: 2014-11-12

## 2014-11-11 MED ORDER — METOPROLOL TARTRATE 1 MG/ML IV SOLN
INTRAVENOUS | Status: DC | PRN
  Administered 2014-11-11 (×2): 5 mg via INTRAVENOUS

## 2014-11-11 MED ORDER — LORATADINE 10 MG PO TABS
10.0000 mg | ORAL_TABLET | Freq: Every day | ORAL | Status: DC
  Filled 2014-11-11: qty 1

## 2014-11-11 MED ORDER — NITROGLYCERIN 1 MG/10 ML FOR IR/CATH LAB
INTRA_ARTERIAL | Status: DC | PRN
  Administered 2014-11-11: 200 ug via INTRA_ARTERIAL

## 2014-11-11 MED ORDER — CLOPIDOGREL BISULFATE 300 MG PO TABS
ORAL_TABLET | ORAL | Status: AC
  Filled 2014-11-11: qty 1

## 2014-11-11 MED ORDER — FAMOTIDINE IN NACL 20-0.9 MG/50ML-% IV SOLN
INTRAVENOUS | Status: AC
  Filled 2014-11-11: qty 50

## 2014-11-11 MED ORDER — ALUM & MAG HYDROXIDE-SIMETH 200-200-20 MG/5ML PO SUSP: 15.0000 mL | ORAL | Status: DC | PRN

## 2014-11-11 MED ORDER — METOPROLOL TARTRATE 1 MG/ML IV SOLN: 2.0000 mg | INTRAVENOUS | Status: DC | PRN

## 2014-11-11 MED ORDER — GUAIFENESIN-DM 100-10 MG/5ML PO SYRP: 15.0000 mL | ORAL_SOLUTION | ORAL | Status: DC | PRN

## 2014-11-11 MED ORDER — ASPIRIN EC 325 MG PO TBEC
325.0000 mg | DELAYED_RELEASE_TABLET | Freq: Every day | ORAL | Status: DC
  Administered 2014-11-11: 325 mg via ORAL
  Filled 2014-11-11 (×2): qty 1

## 2014-11-11 MED ORDER — NITROGLYCERIN 1 MG/10 ML FOR IR/CATH LAB
INTRA_ARTERIAL | Status: AC
  Filled 2014-11-11: qty 10

## 2014-11-11 MED ORDER — LABETALOL HCL 5 MG/ML IV SOLN
10.0000 mg | INTRAVENOUS | Status: DC | PRN
  Filled 2014-11-11: qty 4

## 2014-11-11 MED ORDER — IODIXANOL 320 MG/ML IV SOLN
INTRAVENOUS | Status: DC | PRN
  Administered 2014-11-11: 50 mL via INTRAVENOUS

## 2014-11-11 SURGICAL SUPPLY — 19 items
CATH ANGIO 5F BER2 65CM (CATHETERS) ×3 IMPLANT
COVER PRB 48X5XTLSCP FOLD TPE (BAG) ×1 IMPLANT
COVER PROBE 5X48 (BAG) ×2
DEVICE CONTINUOUS FLUSH (MISCELLANEOUS) ×3 IMPLANT
DRAPE ZERO GRAVITY STERILE (DRAPES) ×3 IMPLANT
KIT ENCORE 26 ADVANTAGE (KITS) ×3 IMPLANT
KIT MICROINTRODUCER STIFF 5F (SHEATH) ×3 IMPLANT
KIT PV (KITS) ×3 IMPLANT
SHEATH PINNACLE 5F 10CM (SHEATH) ×3 IMPLANT
SHEATH PINNACLE 6F 10CM (SHEATH) ×3 IMPLANT
SHEATH PINNACLE ST 6F 45CM (SHEATH) ×3 IMPLANT
SHIELD RADPAD SCOOP 12X17 (MISCELLANEOUS) ×3 IMPLANT
STENT OMNILINK ELITE 8X19X80 (Permanent Stent) ×3 IMPLANT
STOPCOCK MORSE 400PSI 3WAY (MISCELLANEOUS) ×3 IMPLANT
SYR MEDRAD MARK V 150ML (SYRINGE) ×3 IMPLANT
TRANSDUCER W/STOPCOCK (MISCELLANEOUS) ×3 IMPLANT
TRAY PV CATH (CUSTOM PROCEDURE TRAY) ×3 IMPLANT
TUBING HIGH PRESSURE 120CM (CONNECTOR) ×3 IMPLANT
WIRE BENTSON .035X145CM (WIRE) ×3 IMPLANT

## 2014-11-11 NOTE — Progress Notes (Signed)
6 french sheath removed from left brachial artery and pressure held x 25 minutes.  Site looks good with no hematoma.  Small pressure dressing applied to site.  Good distal radial pulses before and after sheath removal.  Vitals stable throughout sheath removal.  Instructions given for keeping arm straight and a armboard was used to keep patient from moving his arm.  RN will continue to monitor.

## 2014-11-11 NOTE — Progress Notes (Signed)
I was called by the RN as pt is dropping his heart rate to the 20's-30's, but is not sustaining this.  He is in the 60's at this time.  Pt was seen by Dr. Acie Fredrickson earlier today and I have called him to see the pt.   Just spoke with Dr. Acie Fredrickson and he has evaluated strips.  Advised to decrease Toprol XL to 50mg  daily.     Leontine Locket 11/11/2014 3:39 PM

## 2014-11-11 NOTE — H&P (View-Only) (Signed)
[name] MRN: [id number] DOB: [birth year] Sex: male     Chief Complaint  Patient presents with  . New Evaluation    Pt. had CTA scan today Chest/Neck here for results.    HISTORY OF PRESENT ILLNESS: The patient is here today for evaluation of possible subclavian steal syndrome. He was recently admitted to the hospital after an episode of syncope. He was in the shower when he began to feel dizzy. He sat down and later woke up on the floor. He did have a laceration in the 4 head which required stitches. The etiology of his syncopal episode was unclear. He was found to be dehydrated in the hospital which was treated with IV fluids. He had a cardiac event monitor that showed episodes of SVT as well as atrial flutter. He was seen by cardiology and initially treated with amiodarone infusion which converted him back to sinus. He is not a candidate for anticoagulation given his ongoing alcohol abuse. He was treated with metoprolol at discharge. 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  The patient does suffer from ongoing alcohol abuse. He suffers from hypercholesterolemia which is been managed with a statin. He is a former smoker.   he will occasionally get dizzy. He did not use his left arm much since my last visit.  Past Medical History  Diagnosis Date  . Allergy   . Gout   . Hyperlipidemia   . EtOH dependence   . Hemothorax on right   . Lisfranc's dislocation 10/20/2011  . Right fibular fracture 10/20/2011    Past Surgical History  Procedure Laterality Date  . Appendectomy    . Hernia repair    . Tonsilectomy, adenoidectomy, bilateral myringotomy and tubes    . 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 Partial hip arthroplasty Left 2010    History   Social History  . Marital Status: Widowed    Spouse Name: N/A  . Number of Children: N/A  . Years of Education: N/A    Occupational History  . Not on file.   Social History Main Topics  . Smoking status: Former Smoker -- 1.00 packs/day for 10 years    Types: Cigarettes    Quit date: 08/09/1969  . Smokeless tobacco: Never Used  . Alcohol Use: 42.0 oz/week    70 Cans of beer per week     Comment: Daily  . Drug Use: No  . Sexual Activity: Not on file   Other Topics Concern  . Not on file   Social History Narrative   Lives in [place],  Has two sons who live with him   He is no longer working - lost his license.    He does not have a valid driver's license because of alcohol-related in fractions      Son drives him ;has MS          Family History  Problem Relation Age of Onset  . Arthritis Mother   . Cerebral aneurysm Mother   . Tuberculosis Father     Allergies as of 11/09/2014 - Review Complete 11/09/2014  Allergen Reaction Noted  . Atorvastatin Anaphylaxis     Current Outpatient Prescriptions on File Prior to Visit  Medication Sig Dispense Refill  . aspirin EC 325 MG tablet Take 1 tablet (325 mg total) by mouth daily. 30 tablet 0  . cetirizine (ZYRTEC) 10 MG tablet Take 10 mg  by mouth daily as needed for allergies.    . diazepam (VALIUM) 2 MG tablet Take 1-2 tablets (2-4 mg total) by mouth daily as needed for anxiety. Take 1/2 tablet by mouth daily prn. 30 tablet 0  . metoprolol succinate (TOPROL-XL) 25 MG 24 hr tablet Take 3 tablets (75 mg total) by mouth daily. 90 tablet 1  . omeprazole (PRILOSEC) 20 MG capsule TAKE 1 CAPSULE BY MOUTH DAILY AS NEEDED FOR HEART BURN. 90 capsule 1  . simvastatin (ZOCOR) 40 MG tablet TAKE (1) TABLET BY MOUTH AT BEDTIME. 30 tablet 5   No current facility-administered medications on file prior to visit.     REVIEW OF SYSTEMS:  see history of present illness otherwise negative  PHYSICAL EXAMINATION:   Vital signs are  Filed Vitals:   11/09/14 1333 11/09/14 1335  BP: 112/74 102/69  Pulse: 67 68  Temp:  98.5 F (36.9 C)  TempSrc:  Oral   Resp:  16  Height:  5\' 6"  (1.676 m)  Weight:  210 lb (95.255 kg)  SpO2:  96%   Body mass index is 33.91 kg/(m^2). General: The patient appears their stated age. HEENT:  No gross abnormalities Pulmonary:  Non labored breathing Musculoskeletal: There are no major deformities. Neurologic: No focal weakness or paresthesias are detected, Skin: There are no ulcer or rashes noted. Psychiatric: The patient has normal affect. Cardiovascular: There is a regular rate and rhythm without significant murmur appreciated.   Diagnostic Studies I have reviewed his CTA with the following results: Vascular Impression:  1. Eccentric mixed calcified and noncalcified atherosclerotic plaque results in a focal severe (approximately 70%) luminal narrowing involving the proximal aspect of the left subclavian artery - findings which when correlated with carotid Doppler ultrasound remain worrisome for subclavian steal syndrome. 2. Otherwise, scattered minimal amount of atherosclerotic plaque with a normal caliber thoracic aorta. No thoracic aortic dissection or periaortic stranding. 3. Coronary artery calcifications. Nonvascular Impression:  1. Punctate (approximately 6 mm) subpleural nodule within the left lower lobe common minimally increased since the 2013 examination, previously, 4 mm in diameter. As such, a follow-up chest CT at 6 - 12 months is recommended. This recommendation follows the consensus statement: Guidelines for Management of Small Pulmonary Nodules Detected on CT Scans: A Statement from the [place] as published in Radiology 2005;237:395-400. 2. Cholelithiasis without evidence of cholecystitis.  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 I believe that because the artery is not 100% occluded, that he would be a  candidate to have this repaired with a stent. I suspect that his syncopal episodes are multifactorial in etiology.  Subclavian steal is certainly within the differential.  After lengthy discussion we have decided to proceed with aortic arch angiography and possible stenting of his left subclavian artery stenosis.  This will be scheduled for Wednesday, July 27.  Eldridge Abrahams, M.D. [facility] Office: [phone] Pager:  [phone]

## 2014-11-11 NOTE — Op Note (Signed)
    [name] MRN: [id number] DOB: July 29, 1945 Sex: male  11/11/2014 Pre-operative Diagnosis: Left subclavian steal Post-operative diagnosis:  Same Surgeon:  Annamarie Major Procedure Performed:  1.  Ultrasound-guided left brachial artery  2.  Left subclavian angiogram  3.  Left subclavian stent  Findings: 75% stenosis within the left subclavian artery proximal to the vertebral origin.  This was successfully treated with a 8 x 19 balloon expandable stent with residual stenosis of less than 5%   Indications:  The patient was recently admitted for syncope.  Within the differential was left subclavian steal.  He is here for treatment of his subclavian stenosis which was confirmed with CT scan imaging  Procedure:  The patient was identified in the holding area and taken to room 8.  The patient was then placed supine on the table and prepped and draped in the usual sterile fashion.  A time out was called.  Ultrasound was used to evaluate the left brachial artery.  It was patent .  A digital ultrasound image was acquired.  A micropuncture needle was used to access the left brachial artery under ultrasound guidance.  An 018 wire was advanced without resistance and a micropuncture sheath was placed.  The 018 wire was removed and a benson wire was placed.  The micropuncture sheath was exchanged for a 5 french sheath.  A Berenstein 2 catheter was advanced to the origin of the left subclavian artery and a left subclavian angiogram was performed.  Findings:   Left subclavian:  A 75% stenosis is identified in the proximal left subclavian artery.  The vertebral artery and thyrocervical trunk are widely patent    Intervention:  After the above images were acquired, the decision was made to proceed with intervention.  A 6 French 45 cm sheath was inserted.  The patient was fully heparinized.  A Abbott 8 x 19 balloon expandable stent was then deployed across the stenosis.  The balloon was taken  to nominal pressure.  Completion imaging revealed resolution of the stenosis the long sheath was exchanged out for a short 6 French sheath and the patient be taken the holding area for sheath pull once his coagulation profile corrects.  Impression:  #1  successful left subclavian artery stent using an 8 x 19 balloon expandable stent   V. Annamarie Major, M.D. [facility] Office: [phone] Pager:  [phone]

## 2014-11-11 NOTE — Progress Notes (Signed)
Son in to visit. Denies pain.

## 2014-11-11 NOTE — Interval H&P Note (Signed)
History and Physical Interval Note:  11/11/2014 8:58 AM  Victor Diaz  has presented today for surgery, with the diagnosis of left subclavion stenosis  The various methods of treatment have been discussed with the patient and family. After consideration of risks, benefits and other options for treatment, the patient has consented to  Procedure(s): Aortic Arch Angiography/Left Subclavion Stent (N/A) as a surgical intervention .  The patient's history has been reviewed, patient examined, no change in status, stable for surgery.  I have reviewed the patient's chart and labs.  Questions were answered to the patient's satisfaction.     Annamarie Major

## 2014-11-11 NOTE — Consult Note (Signed)
CONSULT NOTE  Date: 11/11/2014               Patient Name:  Victor Diaz MRN: [id number]  DOB: 01/31/1946 Age / Sex: 69 y.o., male        PCP: Dorothyann Peng Primary Cardiologist: Domenic Polite            Referring Physician: Trula Slade               Reason for Consult:  atrial flutter            History of Present Illness: Patient is a 69 y.o. male with a PMHx of atrial flutter, ETOH abuse, , who was admitted to [facility] on 11/11/2014 for evaluation of left subclavian steal syndrome  He was noted to be in atrial flutter just before the start of his left subclavian stent procedure .  He converted to NSR after several doses of IV lopressor.   He was admitted June 30 with syncope. Notes from June 30 consult note follow:    he was feeling lightheaded and dizzy. Took a shower, feeling got worse, got out of the shower and sat on toilet to dry off and passed out. Son who lives with him got him to ER. He sustained a laceration to his right temple.   On arrival to ER 118/77, HR 98 bpm, O2 sat 96%, afebrile. He was found to be hyponatremic Na 129, creatinine 1.04, elevated alcohol level 75. Elevated LFTs, AST of 140; ALT 98. CXR negative for CHF and pneumonia. EKG sinus tachycardia with PVC, rate of 100 bpm.   He admits to drinking a 12-pack of beer daily with admissions to ETOH rehab facilities in the past, but he would return to drinking. He states that when he tries to stop drinking on his own, he drinks coffee 12 or more cups a day. He used to binge drink but now is drinking daily. He had consumed 6 beers prior to feeling dizzy, but states he feels dizzy when he is not drinking as well. He passes out a lot "when I'm not sober" but he did once pass out in the past when he wasn't drinking. He reports a fairly long-standing history of intermittent dizziness when standing.  A 48 recent Holter monitor was placed by his PCP which was abnormal demonstrating brief episode of atrial flutter and very  short runs of SVT, occasional PVCs. He was referred to cardiology but did not come to appointment. He is aware of his HR skipping and feeling some flutters.  He currently feels well. He has not noticed any palpitations. He occasionally has some chest tightness when his heart goes fast. He still drinks about 12 beers a day. He's not made any effort to stop or slow down his alcohol intake.    Medications: Outpatient medications: Prescriptions prior to admission  Medication Sig Dispense Refill Last Dose  . aspirin EC 325 MG tablet Take 1 tablet (325 mg total) by mouth daily. 30 tablet 0 11/10/2014 at Unknown time  . cetirizine (ZYRTEC) 10 MG tablet Take 10 mg by mouth daily as needed for allergies.   11/11/2014 at Unknown time  . diazepam (VALIUM) 2 MG tablet Take 1-2 tablets (2-4 mg total) by mouth daily as needed for anxiety. Take 1/2 tablet by mouth daily prn. 30 tablet 0 11/11/2014 at Unknown time  . metoprolol succinate (TOPROL-XL) 25 MG 24 hr tablet Take 3 tablets (75 mg total) by mouth daily. 90 tablet 1 11/11/2014  at 0500  . omeprazole (PRILOSEC) 20 MG capsule TAKE 1 CAPSULE BY MOUTH DAILY AS NEEDED FOR HEART BURN. 90 capsule 1 Past Week at Unknown time  . simvastatin (ZOCOR) 40 MG tablet TAKE (1) TABLET BY MOUTH AT BEDTIME. 30 tablet 5 11/10/2014 at Unknown time    Current medications: Current Facility-Administered Medications  Medication Dose Route Frequency Provider Last Rate Last Dose  . 0.9 %  sodium chloride infusion   Intravenous Continuous Serafina Mitchell, MD      . Derrill Memo ON 11/12/2014] clopidogrel (PLAVIX) tablet 75 mg  75 mg Oral Daily Serafina Mitchell, MD         Allergies  Allergen Reactions  . Atorvastatin Anaphylaxis    LIPITOR -  facial swelling     Past Medical History  Diagnosis Date  . Allergy   . Gout   . Hyperlipidemia   . EtOH dependence   . Hemothorax on right   . Lisfranc's dislocation 10/20/2011  . Right fibular fracture 10/20/2011    Past Surgical History   Procedure Laterality Date  . Appendectomy    . Hernia repair    . Tonsilectomy, adenoidectomy, bilateral myringotomy and tubes    . 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 Partial hip arthroplasty Left 2010    Family History  Problem Relation Age of Onset  . Arthritis Mother   . Cerebral aneurysm Mother   . Tuberculosis Father     Social History:  reports that he quit smoking about 45 years ago. His smoking use included Cigarettes. He has a 10 pack-year smoking history. He has never used smokeless tobacco. He reports that he drinks about 42.0 oz of alcohol per week. He reports that he does not use illicit drugs.   Review of Systems: Constitutional:  denies fever, chills, diaphoresis, appetite change and fatigue.  HEENT: denies photophobia, eye pain, redness, hearing loss, ear pain, congestion, sore throat, rhinorrhea, sneezing, neck pain, neck stiffness and tinnitus.  Respiratory: denies SOB, DOE, cough, chest tightness, and wheezing.  Cardiovascular: admits to chest pain, palpitations   Gastrointestinal: denies nausea, vomiting, abdominal pain, diarrhea, constipation, blood in stool.  Genitourinary: denies dysuria, urgency, frequency, hematuria, flank pain and difficulty urinating.  Musculoskeletal: denies  myalgias, back pain, joint swelling, arthralgias and gait problem.   Skin: denies pallor, rash and wound.  Neurological: denies dizziness, seizures, syncope, weakness, light-headedness, numbness and headaches.   Hematological: denies adenopathy, easy bruising, personal or family bleeding history.  Psychiatric/ Behavioral: denies suicidal ideation, mood changes, confusion, nervousness, sleep disturbance and agitation.    Physical Exam: BP 126/64 mmHg  Pulse 72  Temp(Src) 97.9 F (36.6 C) (Oral)  Resp 18  Ht 5\' 6"  (1.676 m)  Wt 94.348 kg (208 lb)  BMI 33.59 kg/m2  SpO2 98%  Wt Readings from Last 3 Encounters:  11/11/14 94.348 kg (208 lb)  11/09/14 95.255 kg (210  lb)  10/26/14 93.441 kg (206 lb)    General: Vital signs reviewed and noted. Well-developed, well-nourished, in no acute distress; alert,   Head: Normocephalic, atraumatic, sclera anicteric,   Neck: Supple. Negative for carotid bruits. No JVD   Lungs:  Clear bilaterally, no  wheezes, rales, or rhonchi. Breathing is normal   Heart: RRR with S1 S2. No murmurs, rubs, or gallops   Abdomen/ GI :  Soft, non-tender, non-distended with normoactive bowel sounds. No hepatomegaly. No rebound/guarding. No obvious abdominal masses   MSK: Strength and the appear normal for age.   Extremities: No  clubbing or cyanosis. No edema.  Distal pedal pulses are 2+ and equal   Neurologic:  CN are grossly intact,  No obvious motor or sensory defect.  Alert and oriented X 3. Moves all extremities spontaneously.  Psych: Responds to questions appropriately with a normal affect.     Lab results: Basic Metabolic Panel:  Recent Labs Lab 11/11/14 0759  NA 126*  K 4.2  CL 93*  GLUCOSE 95  BUN 11  CREATININE 1.10    Liver Function Tests: No results for input(s): AST, ALT, ALKPHOS, BILITOT, PROT, ALBUMIN in the last 168 hours. No results for input(s): LIPASE, AMYLASE in the last 168 hours. No results for input(s): AMMONIA in the last 168 hours.  CBC:  Recent Labs Lab 11/11/14 0759  HGB 15.3  HCT 45.0    Cardiac Enzymes: No results for input(s): CKTOTAL, CKMB, CKMBINDEX, TROPONINI in the last 168 hours.  BNP: Invalid input(s): POCBNP  CBG: No results for input(s): GLUCAP in the last 168 hours.  Coagulation Studies: No results for input(s): LABPROT, INR in the last 72 hours.   Other results:  Personal review of EKG shows :  Atrial flutter with rapid vent. Response.  Tele currently show NSR    Imaging:  No results found.   Last Echo (6/130/16)  - Left ventricle: The cavity size was normal. Wall thickness was normal. Systolic function was normal. The estimated ejection fraction  was in the range of 55% to 60%. There is akinesis of the basalinferolateral myocardium. Features are consistent with a pseudonormal left ventricular filling pattern, with concomitant abnormal relaxation and increased filling pressure (grade 2 diastolic dysfunction). - Aortic valve: There was mild stenosis. There was trivial regurgitation. Mean gradient (S): 7 mm Hg. Peak gradient (S): 15 mm Hg. Valve area by planimetry 1.7 cm2. - Mitral valve: Calcified annulus. There was trivial regurgitation. - Left atrium: The atrium was at the upper limits of normal in size. - Right atrium: Central venous pressure (est): 3 mm Hg. - Tricuspid valve: There was trivial regurgitation. - Pulmonary arteries: PA peak pressure: 27 mm Hg (S). - Pericardium, extracardiac: There was no pericardial effusion.  Impressions:  - Normal LV wall thickness with LVEF 08-65%, grade 2 diastolic dysfunction. Basal inferolateral akinesis noted. Upper normal left atrial size. Mild aortic stenosis as outlined. Mild aortic regurgitation. Trivial tricuspid regurgitation with PASP 27 mmHg.      Assessment & Plan:  Atrial flutter: The patient has a known history of atrial flutter. He was seen in consultation by Dr. Domenic Polite on October 15, 2014 at [facility].  The patient has a history of heavy alcohol abuse and has hit his head on multiple occasions.   He is a poor candidate for AV coagulation.  All of this has been discussed with the patient in the previous consultation and I reviewed it with him again today. He responded very well with metoprolol. I would continue with the current dose of metoprolol 75 mg a day. We can titrate this upward as tolerated if needed. We could also consider adding PRN propranolol 10 mg to take if he has recurrent atrial flutter   He's had an echocardiogram which reveals normal left ventricular systolic function. At this time I do not think that he needs any additional  evaluation. He will follow-up with Dr. Domenic Polite in [place].     Thayer Headings, Brooke Bonito., MD, [facility] 11/11/2014, 1:04 PM Office - [phone] Pager [phone]

## 2014-11-12 ENCOUNTER — Telehealth: Payer: Self-pay | Admitting: *Deleted

## 2014-11-12 DIAGNOSIS — I708 Atherosclerosis of other arteries: Secondary | ICD-10-CM | POA: Diagnosis not present

## 2014-11-12 DIAGNOSIS — G458 Other transient cerebral ischemic attacks and related syndromes: Secondary | ICD-10-CM | POA: Diagnosis not present

## 2014-11-12 MED ORDER — METOPROLOL SUCCINATE ER 25 MG PO TB24: 50.0000 mg | ORAL_TABLET | Freq: Every day | ORAL | Status: DC

## 2014-11-12 MED ORDER — ASPIRIN EC 81 MG PO TBEC: 81.0000 mg | DELAYED_RELEASE_TABLET | Freq: Every day | ORAL | Status: DC

## 2014-11-12 NOTE — Telephone Encounter (Signed)
Transition Care Management Follow-up Telephone Call  How have you been since you were released from the hospital? Feeling a lot better   Do you understand why you were in the hospital? yes   Do you understand the discharge instrcutions? yes  Items Reviewed:  Medications reviewed: yes  Allergies reviewed: yes  Dietary changes reviewed: yes  Referrals reviewed: yes   Functional Questionnaire:   Activities of Daily Living (ADLs):   He states they are independent in the following: ambulation, bathing and hygiene, feeding, continence, grooming, toileting and dressing States they require assistance with the following: none   Any transportation issues/concerns?: no - son drives patient   Any patient concerns? no   Confirmed importance and date/time of follow-up visits scheduled: yes   Confirmed with patient if condition begins to worsen call PCP or go to the ER.  Patient was given the Call-a-Nurse line [phone]: yes Left message on machine for patient to return our call Patient was discharged 11/11/14 Patient was discharged to home Patient has an appointment with Sallee Provencal 11/19/14 at 3pm

## 2014-11-12 NOTE — Progress Notes (Signed)
Patient to D/C. Education on signs and symptoms of infection done. Patient IV removed. Patient tele box removed. Belongings given to patient. D/C home with son.   Domingo Dimes RN

## 2014-11-12 NOTE — Discharge Summary (Signed)
[facility] Discharge Summary  Victor Diaz [birth year] 69 y.o. male  [id number]  Admission Date: 11/11/2014  Discharge Date: 11/12/2014  Physician: Harold Barban, MD  Admission Diagnosis: left subclavion stenosis  HPI:   This is a 69 y.o. male who presented for evaluation of possible subclavian steal syndrome. He was recently admitted to the hospital after an episode of syncope. He was in the shower when he began to feel dizzy. He sat down and later woke up on the floor. He did have a laceration in the 4 head which required stitches. The etiology of his syncopal episode was unclear. He was found to be dehydrated in the hospital which was treated with IV fluids. He had a cardiac event monitor that showed episodes of SVT as well as atrial flutter. He was seen by cardiology and initially treated with amiodarone infusion which converted him back to sinus. He is not a candidate for anticoagulation given his ongoing alcohol abuse. He was treated with metoprolol at discharge. 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  The patient does suffer from ongoing alcohol abuse. He suffers from hypercholesterolemia which is been managed with a statin. He is a former smoker.  He will occasionally get dizzy. He did not use his left arm much since his last visit.   Hospital Course:  The patient was admitted to the hospital and taken to the operating room on 11/11/2014 and underwent:  1. Ultrasound-guided left brachial artery 2. Left subclavian angiogram 3. Left subclavian stent  Findings:  75% stenosis within the left subclavian artery proximal to the vertebral origin. This was successfully treated with a 8 x 19 balloon expandable stent with residual stenosis of less than 5%  The patient tolerated the procedure well and was transported to the PACU in stable condition. Preoperatively, the patient had  atrial flutter but converted to NSR after several doses of IV lopressor. Cardiology was consulted to evaluation post-op.   The patient had a short unsustained period of heart rate in the 20s-30s but returned to the 60s. Dr. Acie Fredrickson with cardiology recommended decrease of his Toprol XL to 50 mg daily.   POD 1: He had a palpable left radial pulse and brachial access site was without hematoma. His tachycardia and bradycardia were resolved. He denied any chest pain. He was discharged home on POD 1 in good condition. Plavix was not prescribed due to patient's history of alcohol abuse and fall risk.     CBC    Component Value Date/Time   WBC 5.4 10/17/2014 0457   WBC 15.1* 01/27/2012 1332   RBC 3.73* 10/17/2014 0457   RBC 4.31* 01/27/2012 1332   HGB 15.3 11/11/2014 0759   HGB 14.8 01/27/2012 1332   HCT 45.0 11/11/2014 0759   HCT 42.1 01/27/2012 1332   PLT 147* 10/17/2014 0457   PLT 138* 01/27/2012 1332   MCV 100.5* 10/17/2014 0457   MCV 98 01/27/2012 1332   MCH 33.5 10/17/2014 0457   MCH 34.3* 01/27/2012 1332   MCHC 33.3 10/17/2014 0457   MCHC 35.1 01/27/2012 1332   RDW 13.5 10/17/2014 0457   RDW 13.9 01/27/2012 1332   LYMPHSABS 1.9 10/14/2014 1654   MONOABS 0.6 10/14/2014 1654   EOSABS 0.1 10/14/2014 1654   BASOSABS 0.1 10/14/2014 1654    BMET    Component Value Date/Time   NA 126* 11/11/2014 0759   NA 132* 01/27/2012 1332   K 4.2  11/11/2014 0759   K 4.5 01/27/2012 1332   CL 93* 11/11/2014 0759   CL 98 01/27/2012 1332   CO2 23 10/17/2014 0457   CO2 24 01/27/2012 1332   GLUCOSE 95 11/11/2014 0759   GLUCOSE 113* 01/27/2012 1332   BUN 11 11/11/2014 0759   BUN 12 01/27/2012 1332   CREATININE 1.10 11/11/2014 0759   CREATININE 1.11 01/27/2012 1332   CALCIUM 8.6* 10/17/2014 0457   CALCIUM 9.5 01/27/2012 1332   GFRNONAA >60 10/17/2014 0457   GFRNONAA >60 01/27/2012 1332   GFRAA >60 10/17/2014 0457   GFRAA >60 01/27/2012 1332     Discharge Instructions:   The patient is  discharged to home with extensive instructions on wound care and progressive ambulation.  They are instructed not to drive or perform any heavy lifting until returning to see the physician in his office.  Discharge Instructions    Call MD for:  redness, tenderness, or signs of infection (pain, swelling, bleeding, redness, odor or green/yellow discharge around incision site)    Complete by:  As directed      Call MD for:  severe or increased pain, loss or decreased feeling  in affected limb(s)    Complete by:  As directed      Call MD for:  temperature >100.5    Complete by:  As directed      Driving Restrictions    Complete by:  As directed   No driving for 1 week     Increase activity slowly    Complete by:  As directed   Walk with assistance use walker or cane as needed     Lifting restrictions    Complete by:  As directed   No lifting for 1 week     Resume previous diet    Complete by:  As directed            Discharge Diagnosis:  left subclavion stenosis  Secondary Diagnosis: Patient Active Problem List   Diagnosis Date Noted  . 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 Thrombocytopenia 10/16/2014  . Atrial flutter 10/16/2014  . Elevated LFTs 10/16/2014  . Subclavian steal syndrome 10/16/2014  . Subclavian artery stenosis, left 10/16/2014  . Diastolic dysfunction 16/01/9603  . Carotid stenosis 10/16/2014  . Bruit of left carotid artery   . Dehydration   . Syncope 10/14/2014  . ETOH abuse 10/14/2014  . Hyponatremia 10/14/2014  . Metabolic acidosis 54/12/8117  . Chest pain 10/14/2014  . Laceration of head 10/14/2014  . Malnutrition 10/14/2014  . Forehead laceration   . Adjustment disorder with mixed anxiety and depressed mood 03/17/2013  . Aortic stenosis 08/03/2011  . Cerebrovascular disease 08/03/2011  . ASCVD (arteriosclerotic cardiovascular disease) 08/03/2011  . Bladder neck obstruction 08/18/2010  . PLEURAL EFFUSION, RIGHT 04/07/2010  . GERD  05/14/2009  . HLD (hyperlipidemia) 12/11/2006  . GOUT 12/11/2006  . DEPENDENCE, ALCOHOL NEC/NOS, UNSPECIFIED 12/11/2006  . ALLERGIC RHINITIS 12/11/2006  . LIVER FUNCTION TESTS, ABNORMAL 12/11/2006   Past Medical History  Diagnosis Date  . Allergy   . Gout   . Hyperlipidemia   . EtOH dependence   . Hemothorax on right   . Lisfranc's dislocation 10/20/2011  . Right fibular fracture 10/20/2011       Medication List    TAKE these medications        aspirin EC 81 MG tablet  Take 1 tablet (81 mg total) by mouth daily.     cetirizine 10 MG tablet  Commonly  known as:  ZYRTEC  Take 10 mg by mouth daily as needed for allergies.     diazepam 2 MG tablet  Commonly known as:  VALIUM  Take 1-2 tablets (2-4 mg total) by mouth daily as needed for anxiety. Take 1/2 tablet by mouth daily prn.     metoprolol succinate 25 MG 24 hr tablet  Commonly known as:  TOPROL-XL  Take 2 tablets (50 mg total) by mouth daily.     omeprazole 20 MG capsule  Commonly known as:  PRILOSEC  TAKE 1 CAPSULE BY MOUTH DAILY AS NEEDED FOR HEART BURN.     simvastatin 40 MG tablet  Commonly known as:  ZOCOR  TAKE (1) TABLET BY MOUTH AT BEDTIME.        Disposition: Home  Patient's condition: is Good  Follow up: 1. Dr. Trula Slade in 4 weeks   Virgina Jock, PA-C [facility] [phone] 11/12/2014  4:25 PM

## 2014-11-12 NOTE — Progress Notes (Signed)
    Subjective  - s/p left brachial stent  Tachycardia resolved No chest pain No arm pain   Physical Exam:  L brachial access ecchymosis.  No hematoma Palpable left radial pulse Non-labored breathing       Assessment/Plan:  POD #1  Tachycardia:  Appreciate cardiology assistance. NSR this am Given fall risk, will not start plavix, just ASA 81 mg F/u 1 month  Brabham, [place] 11/12/2014 7:49 AM --  Filed Vitals:   11/12/14 0437  BP: 136/73  Pulse: 67  Temp: 98.4 F (36.9 C)  Resp: 17    Intake/Output Summary (Last 24 hours) at 11/12/14 0749 Last data filed at 11/12/14 0743  Gross per 24 hour  Intake      0 ml  Output   1575 ml  Net  -1575 ml     Laboratory CBC    Component Value Date/Time   WBC 5.4 10/17/2014 0457   WBC 15.1* 01/27/2012 1332   HGB 15.3 11/11/2014 0759   HGB 14.8 01/27/2012 1332   HCT 45.0 11/11/2014 0759   HCT 42.1 01/27/2012 1332   PLT 147* 10/17/2014 0457   PLT 138* 01/27/2012 1332    BMET    Component Value Date/Time   NA 126* 11/11/2014 0759   NA 132* 01/27/2012 1332   K 4.2 11/11/2014 0759   K 4.5 01/27/2012 1332   CL 93* 11/11/2014 0759   CL 98 01/27/2012 1332   CO2 23 10/17/2014 0457   CO2 24 01/27/2012 1332   GLUCOSE 95 11/11/2014 0759   GLUCOSE 113* 01/27/2012 1332   BUN 11 11/11/2014 0759   BUN 12 01/27/2012 1332   CREATININE 1.10 11/11/2014 0759   CREATININE 1.11 01/27/2012 1332   CALCIUM 8.6* 10/17/2014 0457   CALCIUM 9.5 01/27/2012 1332   GFRNONAA >60 10/17/2014 0457   GFRNONAA >60 01/27/2012 1332   GFRAA >60 10/17/2014 0457   GFRAA >60 01/27/2012 1332    COAG Lab Results  Component Value Date   INR 1.06 04/15/2010   INR 1.14 03/02/2010   INR 2.4* 09/04/2008   No results found for: PTT  Antibiotics Anti-infectives    None       V. Leia Alf, M.D. [facility] [place] Office: [phone] Pager:  [phone]

## 2014-11-13 ENCOUNTER — Telehealth: Payer: Self-pay | Admitting: Surgery

## 2014-11-13 NOTE — Telephone Encounter (Signed)
-----   Message from Mena Goes, RN sent at 11/11/2014 10:38 AM EDT ----- Regarding: Schedule   ----- Message -----    From: Serafina Mitchell, MD    Sent: 11/11/2014  10:21 AM      To: [facility] Charge Pool  11/11/2014:  Surgeon:  Annamarie Major Procedure Performed:  1.  Ultrasound-guided left brachial artery  2.  Left subclavian angiogram  3.  Left subclavian stent  Schedule follow-up in one month with a duplex of the left subclavian artery

## 2014-11-13 NOTE — Telephone Encounter (Signed)
LM for pt re appt, dpm °

## 2014-11-19 ENCOUNTER — Ambulatory Visit: Payer: Medicare Other | Admitting: Adult Health

## 2014-11-24 ENCOUNTER — Ambulatory Visit: Payer: Medicare Other | Admitting: Adult Health

## 2014-11-25 ENCOUNTER — Telehealth: Payer: Self-pay | Admitting: Family Medicine

## 2014-11-25 MED ORDER — SIMVASTATIN 40 MG PO TABS: ORAL_TABLET | ORAL | Status: DC

## 2014-11-25 NOTE — Telephone Encounter (Signed)
Refill request for Simvastatin 40 mg take 1 po qd and a 90 day supply to [place].

## 2014-11-25 NOTE — Telephone Encounter (Signed)
Rx sent to pharmacy   

## 2014-12-10 ENCOUNTER — Encounter (HOSPITAL_COMMUNITY): Payer: Medicare Other

## 2014-12-11 ENCOUNTER — Encounter: Payer: Self-pay | Admitting: Surgery

## 2014-12-14 ENCOUNTER — Encounter: Payer: Medicare Other | Admitting: Surgery

## 2014-12-25 ENCOUNTER — Encounter: Payer: Self-pay | Admitting: Surgery

## 2014-12-28 ENCOUNTER — Ambulatory Visit: Payer: Medicare Other | Admitting: Surgery

## 2014-12-28 ENCOUNTER — Encounter (HOSPITAL_COMMUNITY): Payer: Medicare Other

## 2015-01-29 ENCOUNTER — Encounter (HOSPITAL_COMMUNITY): Payer: Self-pay | Admitting: General Practice

## 2015-01-29 ENCOUNTER — Other Ambulatory Visit: Payer: Self-pay | Admitting: Adult Health

## 2015-01-29 ENCOUNTER — Inpatient Hospital Stay (HOSPITAL_COMMUNITY)
Admission: EM | Admit: 2015-01-29 | Discharge: 2015-01-31 | DRG: 312 | Disposition: A | Discharge status: Home / Self Care | Payer: Medicare Other | Attending: Internal Medicine | Admitting: Internal Medicine

## 2015-01-29 DIAGNOSIS — E861 Hypovolemia: Secondary | ICD-10-CM | POA: Diagnosis present

## 2015-01-29 DIAGNOSIS — Z8261 Family history of arthritis: Secondary | ICD-10-CM | POA: Diagnosis not present

## 2015-01-29 DIAGNOSIS — F10229 Alcohol dependence with intoxication, unspecified: Secondary | ICD-10-CM | POA: Diagnosis present

## 2015-01-29 DIAGNOSIS — Z8249 Family history of ischemic heart disease and other diseases of the circulatory system: Secondary | ICD-10-CM

## 2015-01-29 DIAGNOSIS — Z7982 Long term (current) use of aspirin: Secondary | ICD-10-CM | POA: Diagnosis not present

## 2015-01-29 DIAGNOSIS — Z96642 Presence of left artificial hip joint: Secondary | ICD-10-CM | POA: Diagnosis present

## 2015-01-29 DIAGNOSIS — R55 Syncope and collapse: Principal | ICD-10-CM | POA: Diagnosis present

## 2015-01-29 DIAGNOSIS — Y904 Blood alcohol level of 80-99 mg/100 ml: Secondary | ICD-10-CM | POA: Diagnosis present

## 2015-01-29 DIAGNOSIS — Z87891 Personal history of nicotine dependence: Secondary | ICD-10-CM | POA: Diagnosis not present

## 2015-01-29 DIAGNOSIS — I10 Essential (primary) hypertension: Secondary | ICD-10-CM | POA: Diagnosis present

## 2015-01-29 DIAGNOSIS — E860 Dehydration: Secondary | ICD-10-CM | POA: Diagnosis present

## 2015-01-29 DIAGNOSIS — E669 Obesity, unspecified: Secondary | ICD-10-CM | POA: Diagnosis present

## 2015-01-29 DIAGNOSIS — Z79899 Other long term (current) drug therapy: Secondary | ICD-10-CM | POA: Diagnosis not present

## 2015-01-29 DIAGNOSIS — E785 Hyperlipidemia, unspecified: Secondary | ICD-10-CM | POA: Diagnosis present

## 2015-01-29 DIAGNOSIS — Z888 Allergy status to other drugs, medicaments and biological substances status: Secondary | ICD-10-CM | POA: Diagnosis not present

## 2015-01-29 DIAGNOSIS — E871 Hypo-osmolality and hyponatremia: Secondary | ICD-10-CM | POA: Diagnosis present

## 2015-01-29 DIAGNOSIS — Z6834 Body mass index (BMI) 34.0-34.9, adult: Secondary | ICD-10-CM | POA: Diagnosis not present

## 2015-01-29 DIAGNOSIS — I519 Heart disease, unspecified: Secondary | ICD-10-CM | POA: Diagnosis not present

## 2015-01-29 DIAGNOSIS — F1010 Alcohol abuse, uncomplicated: Secondary | ICD-10-CM

## 2015-01-29 LAB — URINALYSIS, ROUTINE W REFLEX MICROSCOPIC
Bilirubin Urine: NEGATIVE
GLUCOSE, UA: NEGATIVE mg/dL
Hgb urine dipstick: NEGATIVE
Ketones, ur: NEGATIVE mg/dL
LEUKOCYTES UA: NEGATIVE
NITRITE: NEGATIVE
PROTEIN: NEGATIVE mg/dL
Specific Gravity, Urine: 1.008 (ref 1.005–1.030)
Urobilinogen, UA: 0.2 mg/dL (ref 0.0–1.0)
pH: 5 (ref 5.0–8.0)

## 2015-01-29 LAB — BASIC METABOLIC PANEL
ANION GAP: 12 (ref 5–15)
BUN: 8 mg/dL (ref 6–20)
CALCIUM: 8.8 mg/dL — AB (ref 8.9–10.3)
CHLORIDE: 89 mmol/L — AB (ref 101–111)
CO2: 20 mmol/L — AB (ref 22–32)
Creatinine, Ser: 1.18 mg/dL (ref 0.61–1.24)
GFR calc non Af Amer: >60 mL/min (ref >60)
Glucose, Bld: 102 mg/dL — ABNORMAL HIGH (ref 65–99)
Potassium: 4.1 mmol/L (ref 3.5–5.1)
Sodium: 121 mmol/L — ABNORMAL LOW (ref 135–145)

## 2015-01-29 LAB — CBC
HCT: 42.3 % (ref 39.0–52.0)
HEMOGLOBIN: 14.8 g/dL (ref 13.0–17.0)
MCH: 33.9 pg (ref 26.0–34.0)
MCHC: 35 g/dL (ref 30.0–36.0)
MCV: 97 fL (ref 78.0–100.0)
Platelets: 159 10*3/uL (ref 150–400)
RBC: 4.36 MIL/uL (ref 4.22–5.81)
RDW: 12.6 % (ref 11.5–15.5)
WBC: 6.3 10*3/uL (ref 4.0–10.5)

## 2015-01-29 LAB — CBG MONITORING, ED: GLUCOSE-CAPILLARY: 97 mg/dL (ref 65–99)

## 2015-01-29 LAB — ETHANOL: ALCOHOL ETHYL (B): 99 mg/dL — AB (ref <5)

## 2015-01-29 LAB — TROPONIN I

## 2015-01-29 MED ORDER — SODIUM CHLORIDE 0.9 % IV SOLN
1000.0000 mL | Freq: Once | INTRAVENOUS | Status: AC
  Administered 2015-01-29: 1000 mL via INTRAVENOUS

## 2015-01-29 MED ORDER — LORAZEPAM 1 MG PO TABS
1.0000 mg | ORAL_TABLET | Freq: Four times a day (QID) | ORAL | Status: DC | PRN
  Administered 2015-01-29 – 2015-01-30 (×2): 1 mg via ORAL
  Filled 2015-01-29 (×2): qty 1

## 2015-01-29 MED ORDER — LORAZEPAM 1 MG PO TABS: 0.0000 mg | ORAL_TABLET | Freq: Two times a day (BID) | ORAL | Status: DC

## 2015-01-29 MED ORDER — LORAZEPAM 2 MG/ML IJ SOLN: 1.0000 mg | Freq: Four times a day (QID) | INTRAMUSCULAR | Status: DC | PRN

## 2015-01-29 MED ORDER — SODIUM CHLORIDE 0.9 % IV SOLN
1000.0000 mL | INTRAVENOUS | Status: DC
  Administered 2015-01-29: 1000 mL via INTRAVENOUS

## 2015-01-29 MED ORDER — THIAMINE HCL 100 MG/ML IJ SOLN: 100.0000 mg | Freq: Every day | INTRAMUSCULAR | Status: DC

## 2015-01-29 MED ORDER — FOLIC ACID 1 MG PO TABS
1.0000 mg | ORAL_TABLET | Freq: Once | ORAL | Status: AC
  Administered 2015-01-29: 1 mg via ORAL
  Filled 2015-01-29: qty 1

## 2015-01-29 MED ORDER — METOPROLOL SUCCINATE ER 25 MG PO TB24: 50.0000 mg | ORAL_TABLET | Freq: Every day | ORAL | Status: DC

## 2015-01-29 MED ORDER — LORAZEPAM 1 MG PO TABS: 0.0000 mg | ORAL_TABLET | Freq: Four times a day (QID) | ORAL | Status: DC

## 2015-01-29 MED ORDER — VITAMIN B-1 100 MG PO TABS
100.0000 mg | ORAL_TABLET | Freq: Every day | ORAL | Status: DC
  Administered 2015-01-30 – 2015-01-31 (×2): 100 mg via ORAL
  Filled 2015-01-29 (×2): qty 1

## 2015-01-29 MED ORDER — ADULT MULTIVITAMIN W/MINERALS CH
1.0000 | ORAL_TABLET | Freq: Every day | ORAL | Status: DC
  Administered 2015-01-30 – 2015-01-31 (×2): 1 via ORAL
  Filled 2015-01-29 (×2): qty 1

## 2015-01-29 MED ORDER — VITAMIN B-1 100 MG PO TABS
100.0000 mg | ORAL_TABLET | Freq: Once | ORAL | Status: AC
  Administered 2015-01-29: 100 mg via ORAL
  Filled 2015-01-29: qty 1

## 2015-01-29 MED ORDER — SODIUM CHLORIDE 0.9 % IV BOLUS (SEPSIS)
1000.0000 mL | Freq: Once | INTRAVENOUS | Status: AC
Start: 2015-01-29 — End: 2015-01-29
  Administered 2015-01-29: 1000 mL via INTRAVENOUS

## 2015-01-29 MED ORDER — FOLIC ACID 1 MG PO TABS
1.0000 mg | ORAL_TABLET | Freq: Every day | ORAL | Status: DC
  Administered 2015-01-30 – 2015-01-31 (×2): 1 mg via ORAL
  Filled 2015-01-29 (×2): qty 1

## 2015-01-29 NOTE — ED Provider Notes (Signed)
CSN: [id number]     Arrival date & time 01/29/15  1623 History   First MD Initiated Contact with Patient 01/29/15 1640     Chief Complaint  Patient presents with  . Loss of Consciousness      HPI Patient presents emergency department after syncopal episode today.  He was feeling weak and lightheaded today.  He does have a history of chronic alcohol abuse.  He reports his appetite has been poor.  He got into his son's car but his seatbelt on and his son noticed that he vomited twice and in became unresponsive.  He reports a slight shaking episode that was not a seizure per the son.  He was unresponsive for approximately 1-2 minutes.  When the patient returned back to the car after seeking help the patient was alert and oriented and back to normal baseline mental status.  No tongue biting.  No history of seizures.  Patient remembers vomiting in the next thing he remembers he was sitting in the car and his son was with him.  No hematemesis described.  He does continue to drink beer everyday.  He admits to drinking beer today.  No preceding chest pain shortness of breath.  No chest pain shortness of breath at this time.   Past Medical History  Diagnosis Date  . Allergy   . Gout   . Hyperlipidemia   . EtOH dependence ([place])   . Hemothorax on right   . Lisfranc's dislocation 10/20/2011  . Right fibular fracture 10/20/2011   Past Surgical History  Procedure Laterality Date  . Appendectomy    . Hernia repair    . Tonsilectomy, adenoidectomy, bilateral myringotomy and tubes    . 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 Partial hip arthroplasty Left 2010  . Peripheral vascular catheterization N/A 11/11/2014    Procedure: Aortic Arch Angiography/Left Subclavion Stent;  Surgeon: Serafina Mitchell, MD;  Location: [facility];  Service: Cardiovascular;  Laterality: N/A;   Family History  Problem Relation Age of Onset  . Arthritis Mother   . Cerebral aneurysm Mother   . Tuberculosis Father     Social History  Substance Use Topics  . Smoking status: Former Smoker -- 1.00 packs/day for 10 years    Types: Cigarettes    Quit date: 08/09/1969  . Smokeless tobacco: Never Used  . Alcohol Use: 37.8 oz/week    63 Cans of beer per week     Comment: Daily    Review of Systems  All other systems reviewed and are negative.     Allergies  Atorvastatin  Home Medications   Prior to Admission medications   Medication Sig Start Date End Date Taking? Authorizing Provider  aspirin 325 MG tablet Take 325 mg by mouth daily.   Yes Historical Provider, MD  cetirizine (ZYRTEC) 10 MG tablet Take 10 mg by mouth daily.    Yes Historical Provider, MD  metoprolol succinate (TOPROL-XL) 25 MG 24 hr tablet Take 2 tablets (50 mg total) by mouth daily. 01/29/15  Yes Dorothyann Peng, NP  Multiple Vitamin (MULTIVITAMIN WITH MINERALS) TABS tablet Take 1 tablet by mouth daily.   Yes Historical Provider, MD  omeprazole (PRILOSEC) 20 MG capsule TAKE 1 CAPSULE BY MOUTH DAILY AS NEEDED FOR HEART BURN. 05/20/14  Yes Kennyth Arnold, FNP  simvastatin (ZOCOR) 40 MG tablet TAKE (1) TABLET BY MOUTH AT BEDTIME. Patient taking differently: Take 40 mg by mouth daily.  11/25/14  Yes Dorothyann Peng, NP  aspirin EC 81 MG tablet Take 1 tablet (81 mg total) by mouth daily. 11/12/14   Alvia Grove, PA-C  diazepam (VALIUM) 2 MG tablet Take 1-2 tablets (2-4 mg total) by mouth daily as needed for anxiety. Take 1/2 tablet by mouth daily prn. 10/18/14   Kathie Dike, MD   BP 122/61 mmHg  Pulse 65  Temp(Src) 98.1 F (36.7 C) (Oral)  Resp 19  Ht 5\' 6"  (1.676 m)  Wt 210 lb (95.255 kg)  BMI 33.91 kg/m2  SpO2 93% Physical Exam  Constitutional: He is oriented to person, place, and time. He appears well-developed and well-nourished.  HENT:  Head: Normocephalic and atraumatic.  Eyes: EOM are normal. Pupils are equal, round, and reactive to light.  Neck: Normal range of motion.  Cardiovascular: Normal rate, regular rhythm,  normal heart sounds and intact distal pulses.   Pulmonary/Chest: Effort normal and breath sounds normal. No respiratory distress.  Abdominal: Soft. He exhibits no distension. There is no tenderness.  Musculoskeletal: Normal range of motion.  Neurological: He is alert and oriented to person, place, and time.  5/5 strength in major muscle groups of  bilateral upper and lower extremities. Speech normal. No facial asymetry.   Skin: Skin is warm and dry.  Psychiatric: He has a normal mood and affect. Judgment normal.  Nursing note and vitals reviewed.   ED Course  Procedures (including critical care time) Labs Review Labs Reviewed  BASIC METABOLIC PANEL - Abnormal; Notable for the following:    Sodium 121 (*)    Chloride 89 (*)    CO2 20 (*)    Glucose, Bld 102 (*)    Calcium 8.8 (*)    All other components within normal limits  CBC  URINALYSIS, ROUTINE W REFLEX MICROSCOPIC (NOT AT [facility])  ETHANOL  TROPONIN I  CBG MONITORING, ED    Imaging Review No results found. I have personally reviewed and evaluated these images and lab results as part of my medical decision-making.   EKG Interpretation   Date/Time:  Friday January 29 2015 16:31:51 EDT Ventricular Rate:  64 PR Interval:  170 QRS Duration: 91 QT Interval:  421 QTC Calculation: 434 R Axis:   53 Text Interpretation:  Sinus rhythm Posterior infarct, old Borderline T  abnormalities, inferior leads No significant change was found Confirmed by  Clydell Alberts  MD, Lennette Bihari ([id number]) on 01/29/2015 6:21:11 PM      MDM   Final diagnoses:  None    Patient will be admitted for syncopal episode.  This is still likely orthostasis with myoclonic jerks.  Could've been a vagal episode.  Could represent abnormal arrhythmia.  Patient need to be admitted to telemetry.  He is profoundly hyponatremic today with sodium 121.  This is likely related to his poor oral intake and his excess alcohol use.  Thiamine and folic acid now.  Ongoing IV  rehydration.  Admit to telemetry.    Jola Schmidt, MD 01/29/15 2014

## 2015-01-29 NOTE — H&P (Signed)
[facility] Admission History and Physical       CARSIN RANDAZZO OHY:[id number] DOB: 1945-12-10 DOA: 01/29/2015  Referring physician: EDP PCP: Dorothyann Peng, NP  Specialists:   Chief Complaint: Passed Out  HPI: Victor Diaz is a 69 y.o. male with a history of ETOH dependence who presents to the ED with complaints of passing out while sitting in a parked car.   He reports feeling bad all day today , and felt light headed, and he had N+V x 2 and then passed out for about 1 minute.  His son was in the car and witnessed the event.   Patient reports that he did drink 7 or 8 beers today, and he reports that he regularly drinks 12 beers daily.     Review of Systems:  Constitutional: No Weight Loss, No Weight Gain, Night Sweats, Fevers, Chills, Dizziness, +Light Headedness, Fatigue, +Malaise, Generalized Weakness HEENT: No Headaches, Difficulty Swallowing,Tooth/Dental Problems,Sore Throat,  No Sneezing, Rhinitis, Ear Ache, Nasal Congestion, or Post Nasal Drip,  Cardio-vascular:  No Chest pain, Orthopnea, PND, Edema in Lower Extremities, Anasarca, Dizziness, Palpitations  Resp: No Dyspnea, No DOE, No Productive Cough, No Non-Productive Cough, No Hemoptysis, No Wheezing.    GI: No Heartburn, Indigestion, Abdominal Pain, +Nausea, +Vomiting, Diarrhea, Constipation, Hematemesis, Hematochezia, Melena, Change in Bowel Habits,  Loss of Appetite  GU: No Dysuria, No Change in Color of Urine, No Urgency or Urinary Frequency, No Flank pain.  Musculoskeletal: No Joint Pain or Swelling, No Decreased Range of Motion, No Back Pain.  Neurologic: +Syncope, No Seizures, Muscle Weakness, Paresthesia, Vision Disturbance or Loss, No Diplopia, No Vertigo, No Difficulty Walking,  Skin: No Rash or Lesions. Psych: No Change in Mood or Affect, No Depression or Anxiety, No Memory loss, No Confusion, or Hallucinations   Past Medical History  Diagnosis Date  . Allergy   . Gout   . Hyperlipidemia   . EtOH  dependence ([place])   . Hemothorax on right   . Lisfranc's dislocation 10/20/2011  . Right fibular fracture 10/20/2011     Past Surgical History  Procedure Laterality Date  . Appendectomy    . Hernia repair    . Tonsilectomy, adenoidectomy, bilateral myringotomy and tubes    . 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 Partial hip arthroplasty Left 2010  . Peripheral vascular catheterization N/A 11/11/2014    Procedure: Aortic Arch Angiography/Left Subclavion Stent;  Surgeon: Serafina Mitchell, MD;  Location: [facility];  Service: Cardiovascular;  Laterality: N/A;      Prior to Admission medications   Medication Sig Start Date End Date Taking? Authorizing Provider  aspirin 325 MG tablet Take 325 mg by mouth daily.   Yes Historical Provider, MD  cetirizine (ZYRTEC) 10 MG tablet Take 10 mg by mouth daily.    Yes Historical Provider, MD  metoprolol succinate (TOPROL-XL) 25 MG 24 hr tablet Take 2 tablets (50 mg total) by mouth daily. 01/29/15  Yes Dorothyann Peng, NP  Multiple Vitamin (MULTIVITAMIN WITH MINERALS) TABS tablet Take 1 tablet by mouth daily.   Yes Historical Provider, MD  omeprazole (PRILOSEC) 20 MG capsule TAKE 1 CAPSULE BY MOUTH DAILY AS NEEDED FOR HEART BURN. 05/20/14  Yes Kennyth Arnold, FNP  simvastatin (ZOCOR) 40 MG tablet TAKE (1) TABLET BY MOUTH AT BEDTIME. Patient taking differently: Take 40 mg by mouth daily.  11/25/14  Yes Dorothyann Peng, NP  aspirin EC 81 MG tablet Take 1 tablet (81 mg total) by mouth daily. 11/12/14  Alvia Grove, PA-C  diazepam (VALIUM) 2 MG tablet Take 1-2 tablets (2-4 mg total) by mouth daily as needed for anxiety. Take 1/2 tablet by mouth daily prn. 10/18/14   Kathie Dike, MD     Allergies  Allergen Reactions  . Atorvastatin Anaphylaxis    LIPITOR -  facial swelling    Social History:  reports that he quit smoking about 45 years ago. His smoking use included Cigarettes. He has a 10 pack-year smoking history. He has never used smokeless  tobacco. He reports that he drinks about 37.8 oz of alcohol per week. He reports that he does not use illicit drugs.    Family History  Problem Relation Age of Onset  . Arthritis Mother   . Cerebral aneurysm Mother   . Tuberculosis Father        Physical Exam:  GEN:  Pleasant Obese Elderly 69 y.o. Caucasian male examined and in no acute distress; cooperative with exam Filed Vitals:   01/29/15 2030 01/29/15 2045 01/29/15 2115 01/29/15 2124  BP: 162/71 156/69 163/67   Pulse: 65 66 65   Temp:    97.8 F (36.6 C)  TempSrc:      Resp: 24 20 20    Height:      Weight:      SpO2: 95% 95% 95%    Blood pressure 163/67, pulse 65, temperature 97.8 F (36.6 C), temperature source Oral, resp. rate 20, height 5\' 6"  (1.676 m), weight 95.255 kg (210 lb), SpO2 95 %. PSYCH: He is alert and oriented x4; does not appear anxious does not appear depressed; affect is normal HEENT: Normocephalic and Atraumatic, Mucous membranes pink; PERRLA; EOM intact; Fundi:  Benign;  No scleral icterus, Nares: Patent, Oropharynx: Clear, Fair Dentition,    Neck:  FROM, No Cervical Lymphadenopathy nor Thyromegaly or Carotid Bruit; No JVD; Breasts:: Not examined CHEST WALL: No tenderness CHEST: Normal respiration, clear to auscultation bilaterally HEART: Regular rate and rhythm; no murmurs rubs or gallops BACK: No kyphosis or scoliosis; No CVA tenderness ABDOMEN: Positive Bowel Sounds, Obese, Soft Non-Tender, No Rebound or Guarding; No Masses, No Organomegaly Rectal Exam: Not done EXTREMITIES: No Cyanosis, Clubbing, or Edema; No Ulcerations. Genitalia: not examined PULSES: 2+ and symmetric SKIN: Normal hydration no rash or ulceration CNS:  Alert and Oriented x 4, No Focal Deficits Vascular: pulses palpable throughout    Labs on Admission:  Basic Metabolic Panel:  Recent Labs Lab 01/29/15 1710  NA 121*  K 4.1  CL 89*  CO2 20*  GLUCOSE 102*  BUN 8  CREATININE 1.18  CALCIUM 8.8*   Liver Function  Tests: No results for input(s): AST, ALT, ALKPHOS, BILITOT, PROT, ALBUMIN in the last 168 hours. No results for input(s): LIPASE, AMYLASE in the last 168 hours. No results for input(s): AMMONIA in the last 168 hours. CBC:  Recent Labs Lab 01/29/15 1710  WBC 6.3  HGB 14.8  HCT 42.3  MCV 97.0  PLT 159   Cardiac Enzymes:  Recent Labs Lab 01/29/15 1657  TROPONINI <0.03    BNP (last 3 results) No results for input(s): BNP in the last 8760 hours.  ProBNP (last 3 results) No results for input(s): PROBNP in the last 8760 hours.  CBG:  Recent Labs Lab 01/29/15 1800  GLUCAP 97    Radiological Exams on Admission: No results found.   EKG: Independently reviewed. Normal Sinus Rhythm Rate = 64 No acute S-T Changes   Assessment/Plan:     69 y.o. male with  Principal Problem:   1.    Hyponatremia- due to Hypovolemia   Cardiac Monitoring   IVFs with NSS   Urine Electrolytes and OSM sent   Monitor Na+ Trend   Active Problems:   2.    Syncope- due to Hypovolemia   Cardiac monitoring   IVFs   Check Orhtostatics     3.    ETOH abuse/ and Intoxication   CIWA Protocol with Oral Ativan Rx   Monitor     4.    Diastolic dysfunction   Monitor for Signs of Fluid Overload     5.    Hyperlipidemia   On Simvastatin Rx     6.    DVT Prophylaxis   Lovenox    Code Status:     FULL CODE       Family Communication:   No Family Present    Disposition Plan:    Inpatient Status        Time spent:   [place] [facility] Pager [phone]   If [place] Please Contact the Day Rounding Team MD for [facility]  If 7PM-7AM, Please Contact Night-Floor Coverage  [email or web address] Password [email or web address] 01/29/2015, 10:01 PM     ADDENDUM:   Patient was seen and examined on 01/29/2015

## 2015-01-29 NOTE — ED Notes (Signed)
Pt presents to ED via [facility] after a syncopal episode. Pt was sitting in the passenger side of parked vehicle with his son. Pt had two episodes of vomiting. Pt had a LOC for about 1 minute. Pt is A/O and has no complaints at this time. Pt has had about 5-6 beers today.

## 2015-01-30 DIAGNOSIS — E785 Hyperlipidemia, unspecified: Secondary | ICD-10-CM

## 2015-01-30 DIAGNOSIS — E871 Hypo-osmolality and hyponatremia: Secondary | ICD-10-CM

## 2015-01-30 HISTORY — DX: Hyperlipidemia, unspecified: E78.5

## 2015-01-30 LAB — BASIC METABOLIC PANEL
Anion gap: 6 (ref 5–15)
BUN: 7 mg/dL (ref 6–20)
CALCIUM: 8.3 mg/dL — AB (ref 8.9–10.3)
CO2: 20 mmol/L — ABNORMAL LOW (ref 22–32)
CREATININE: 1.04 mg/dL (ref 0.61–1.24)
Chloride: 104 mmol/L (ref 101–111)
GFR calc non Af Amer: >60 mL/min (ref >60)
Glucose, Bld: 129 mg/dL — ABNORMAL HIGH (ref 65–99)
Potassium: 4.2 mmol/L (ref 3.5–5.1)
SODIUM: 130 mmol/L — AB (ref 135–145)

## 2015-01-30 LAB — CBC
HCT: 38.9 % — ABNORMAL LOW (ref 39.0–52.0)
Hemoglobin: 13.2 g/dL (ref 13.0–17.0)
MCH: 33.3 pg (ref 26.0–34.0)
MCHC: 33.9 g/dL (ref 30.0–36.0)
MCV: 98.2 fL (ref 78.0–100.0)
PLATELETS: 125 10*3/uL — AB (ref 150–400)
RBC: 3.96 MIL/uL — AB (ref 4.22–5.81)
RDW: 12.9 % (ref 11.5–15.5)
WBC: 3.8 10*3/uL — AB (ref 4.0–10.5)

## 2015-01-30 MED ORDER — ASPIRIN 325 MG PO TABS
325.0000 mg | ORAL_TABLET | Freq: Every day | ORAL | Status: DC
  Administered 2015-01-30 – 2015-01-31 (×2): 325 mg via ORAL
  Filled 2015-01-30 (×2): qty 1

## 2015-01-30 MED ORDER — LORATADINE 10 MG PO TABS
10.0000 mg | ORAL_TABLET | Freq: Every day | ORAL | Status: DC
  Administered 2015-01-30 – 2015-01-31 (×2): 10 mg via ORAL
  Filled 2015-01-30 (×2): qty 1

## 2015-01-30 MED ORDER — ONDANSETRON HCL 4 MG/2ML IJ SOLN: 4.0000 mg | Freq: Four times a day (QID) | INTRAMUSCULAR | Status: DC | PRN

## 2015-01-30 MED ORDER — ONDANSETRON HCL 4 MG PO TABS: 4.0000 mg | ORAL_TABLET | Freq: Four times a day (QID) | ORAL | Status: DC | PRN

## 2015-01-30 MED ORDER — SODIUM CHLORIDE 0.9 % IJ SOLN
3.0000 mL | Freq: Two times a day (BID) | INTRAMUSCULAR | Status: DC
  Administered 2015-01-31: 3 mL via INTRAVENOUS

## 2015-01-30 MED ORDER — METOPROLOL SUCCINATE ER 50 MG PO TB24
50.0000 mg | ORAL_TABLET | Freq: Every day | ORAL | Status: DC
  Administered 2015-01-30 – 2015-01-31 (×2): 50 mg via ORAL
  Filled 2015-01-30 (×2): qty 1

## 2015-01-30 MED ORDER — PANTOPRAZOLE SODIUM 40 MG PO TBEC
40.0000 mg | DELAYED_RELEASE_TABLET | Freq: Every day | ORAL | Status: DC
  Administered 2015-01-30 – 2015-01-31 (×2): 40 mg via ORAL
  Filled 2015-01-30 (×2): qty 1

## 2015-01-30 MED ORDER — SIMVASTATIN 40 MG PO TABS
40.0000 mg | ORAL_TABLET | Freq: Every day | ORAL | Status: DC
  Administered 2015-01-30 – 2015-01-31 (×2): 40 mg via ORAL
  Filled 2015-01-30 (×2): qty 1

## 2015-01-30 MED ORDER — HYDROMORPHONE HCL 1 MG/ML IJ SOLN: 0.5000 mg | INTRAMUSCULAR | Status: DC | PRN

## 2015-01-30 MED ORDER — ENOXAPARIN SODIUM 40 MG/0.4ML ~~LOC~~ SOLN
40.0000 mg | SUBCUTANEOUS | Status: DC
  Administered 2015-01-30 – 2015-01-31 (×2): 40 mg via SUBCUTANEOUS
  Filled 2015-01-30 (×2): qty 0.4

## 2015-01-30 MED ORDER — SODIUM CHLORIDE 0.9 % IV SOLN
INTRAVENOUS | Status: DC
  Administered 2015-01-30: 13:00:00 via INTRAVENOUS

## 2015-01-30 MED ORDER — ALUM & MAG HYDROXIDE-SIMETH 200-200-20 MG/5ML PO SUSP: 30.0000 mL | Freq: Four times a day (QID) | ORAL | Status: DC | PRN

## 2015-01-30 MED ORDER — ACETAMINOPHEN 325 MG PO TABS: 650.0000 mg | ORAL_TABLET | Freq: Four times a day (QID) | ORAL | Status: DC | PRN

## 2015-01-30 MED ORDER — OXYCODONE HCL 5 MG PO TABS: 5.0000 mg | ORAL_TABLET | ORAL | Status: DC | PRN

## 2015-01-30 MED ORDER — INFLUENZA VAC SPLIT QUAD 0.5 ML IM SUSY
0.5000 mL | PREFILLED_SYRINGE | INTRAMUSCULAR | Status: DC
  Filled 2015-01-30: qty 0.5

## 2015-01-30 MED ORDER — ACETAMINOPHEN 650 MG RE SUPP: 650.0000 mg | Freq: Four times a day (QID) | RECTAL | Status: DC | PRN

## 2015-01-30 NOTE — Progress Notes (Signed)
Patient Demographics  Victor Diaz, is a 69 y.o. male, DOB - 02/23/1946, PXT:[id number]  Admit date - 01/29/2015   Admitting Physician Theressa Millard, MD  Outpatient Primary MD for the patient is Dorothyann Peng, NP  LOS - 1   Chief Complaint  Patient presents with  . Loss of Consciousness         Subjective:   Victor Diaz today has, No headache, No chest pain, No abdominal pain - No Nausea, No new weakness tingling or numbness, No Cough - SOB.   Assessment & Plan    Principal Problem:   Hyponatremia Active Problems:   Syncope   ETOH abuse   Diastolic dysfunction   Hyperlipidemia   Faintness  Hyponatremia - Most likely related to beer potomania, baseline sodium is chronically on the lower side, he shouldn't endorses drinking 12 cans of beers on a daily basis. - Improving with IV fluids, will decrease IV normal saline from 125 mL/h to 75 mL/h  Syncope - Most likely due to alcohol intoxication, as well as hypovolemia, continue with IV fluids, continue to monitor on telemetry  Alcohol abuse/intoxication - Continue with CIWA protocol - Continue with thiamine and folic acid - Consult  Diastolic dysfunction - Appears to be euvolemic, monitor for volume overload  Hyperlipidemia - Continue statin  Hypertension - Continue with metoprolol  Code Status: Full  Family Communication: none at bedside  Disposition Plan: home in 24 hours   Procedures  none   Consults   none   Medications  Scheduled Meds: . aspirin  325 mg Oral Daily  . enoxaparin (LOVENOX) injection  40 mg Subcutaneous Q24H  . folic acid  1 mg Oral Daily  . [START ON 01/31/2015] Influenza vac split quadrivalent PF  0.5 mL Intramuscular Tomorrow-1000  . loratadine  10 mg Oral Daily  . LORazepam  0-4 mg Oral Q6H   Followed by  . [START ON 02/01/2015] LORazepam  0-4 mg Oral Q12H  . metoprolol  succinate  50 mg Oral Daily  . multivitamin with minerals  1 tablet Oral Daily  . pantoprazole  40 mg Oral Daily  . simvastatin  40 mg Oral Daily  . sodium chloride  3 mL Intravenous Q12H  . thiamine  100 mg Oral Daily   Or  . thiamine  100 mg Intravenous Daily   Continuous Infusions: . sodium chloride     PRN Meds:.acetaminophen **OR** acetaminophen, alum & mag hydroxide-simeth, LORazepam **OR** LORazepam, ondansetron **OR** ondansetron (ZOFRAN) IV, oxyCODONE  DVT Prophylaxis  Lovenox -   Lab Results  Component Value Date   PLT 125* 01/30/2015    Antibiotics    Anti-infectives    None          Objective:   Filed Vitals:   01/29/15 2204 01/30/15 0431 01/30/15 0600 01/30/15 0921  BP: 155/66   162/68  Pulse: 62 79 82 78  Temp: 98 F (36.7 C) 97.5 F (36.4 C)  97.7 F (36.5 C)  TempSrc: Oral Oral  Other (Comment)  Resp: 20 18    Height:      Weight: 94.303 kg (207 lb 14.4 oz)     SpO2: 99% 100%  99%    Wt Readings from Last 3 Encounters:  01/29/15 94.303 kg (207 lb 14.4 oz)  11/11/14 94.348 kg (208 lb)  11/09/14 95.255 kg (210 lb)     Intake/Output Summary (Last 24 hours) at 01/30/15 1135 Last data filed at 01/30/15 0600  Gross per 24 hour  Intake    240 ml  Output   1500 ml  Net  -1260 ml     Physical Exam  Awake Alert, Oriented X 3, No new F.N deficits, Normal affect [place].AT,PERRAL Supple Neck,No JVD, No cervical lymphadenopathy appriciated.  Symmetrical Chest wall movement, Good air movement bilaterally, CTAB RRR,No Gallops,Rubs or new Murmurs, No Parasternal Heave +ve B.Sounds, Abd Soft, No tenderness, No organomegaly appriciated, No rebound - guarding or rigidity. No Cyanosis, Clubbing or edema, No new Rash or bruise     Data Review   Micro Results No results found for this or any previous visit (from the past 240 hour(s)).  Radiology Reports No results found.   CBC  Recent Labs Lab 01/29/15 1710 01/30/15 0926  WBC 6.3 3.8*    HGB 14.8 13.2  HCT 42.3 38.9*  PLT 159 125*  MCV 97.0 98.2  MCH 33.9 33.3  MCHC 35.0 33.9  RDW 12.6 12.9    Chemistries   Recent Labs Lab 01/29/15 1710 01/30/15 0926  NA 121* 130*  K 4.1 4.2  CL 89* 104  CO2 20* 20*  GLUCOSE 102* 129*  BUN 8 7  CREATININE 1.18 1.04  CALCIUM 8.8* 8.3*   ------------------------------------------------------------------------------------------------------------------ estimated creatinine clearance is 70 mL/min (by C-G formula based on Cr of 1.04). ------------------------------------------------------------------------------------------------------------------ No results for input(s): HGBA1C in the last 72 hours. ------------------------------------------------------------------------------------------------------------------ No results for input(s): CHOL, HDL, LDLCALC, TRIG, CHOLHDL, LDLDIRECT in the last 72 hours. ------------------------------------------------------------------------------------------------------------------ No results for input(s): TSH, T4TOTAL, T3FREE, THYROIDAB in the last 72 hours.  Invalid input(s): FREET3 ------------------------------------------------------------------------------------------------------------------ No results for input(s): VITAMINB12, FOLATE, FERRITIN, TIBC, IRON, RETICCTPCT in the last 72 hours.  Coagulation profile No results for input(s): INR, PROTIME in the last 168 hours.  No results for input(s): DDIMER in the last 72 hours.  Cardiac Enzymes  Recent Labs Lab 01/29/15 1657  TROPONINI <0.03   ------------------------------------------------------------------------------------------------------------------ Invalid input(s): POCBNP     Time Spent in minutes   30 minutes   Louise Victory M.D on 01/30/2015 at 11:35 AM  Between 7am to 7pm - Pager - [phone]  After 7pm go to [email or web address] - password [facility]  [facility]   Office  [phone]

## 2015-01-30 NOTE — Progress Notes (Signed)
Patient's chart reviewed per Epic system list.   Per 01/30/15 MD progress note:  patient  was admitted with hyponatremia  and syncope:  - Most likely related to beer potomania, baseline sodium is chronically on the lower side, he shouldn't endorses drinking 12 cans of beers on a daily basis. - Improving with IV fluids, will decrease IV normal saline from 125 mL/h to 75 mL/h Syncope - Most likely due to alcohol intoxication, as well as hypovolemia, continue with IV fluids, continue to monitor on telemetry.  Attempted to reach patient via his hospital room phone, no answer and unable to leave a message.   Unit RNCM will follow up for further discharge planning needs.   No CM needs identified at this time.  Physical therapy evaluation and treatment pending.    Dorothe Elmore H. Annia Friendly, BSN, [facility]

## 2015-01-31 LAB — BASIC METABOLIC PANEL
ANION GAP: 8 (ref 5–15)
BUN: 10 mg/dL (ref 6–20)
CALCIUM: 8.8 mg/dL — AB (ref 8.9–10.3)
CHLORIDE: 104 mmol/L (ref 101–111)
CO2: 24 mmol/L (ref 22–32)
Creatinine, Ser: 1.13 mg/dL (ref 0.61–1.24)
GFR calc non Af Amer: >60 mL/min (ref >60)
Glucose, Bld: 108 mg/dL — ABNORMAL HIGH (ref 65–99)
POTASSIUM: 4.2 mmol/L (ref 3.5–5.1)
Sodium: 136 mmol/L (ref 135–145)

## 2015-01-31 LAB — PHOSPHORUS: PHOSPHORUS: 2.7 mg/dL (ref 2.5–4.6)

## 2015-01-31 LAB — MAGNESIUM: Magnesium: 1.9 mg/dL (ref 1.7–2.4)

## 2015-01-31 MED ORDER — THIAMINE HCL 100 MG PO TABS
100.0000 mg | ORAL_TABLET | Freq: Every day | ORAL | Status: DC
Start: 2015-01-31 — End: 2015-06-23

## 2015-01-31 MED ORDER — FOLIC ACID 1 MG PO TABS: 1.0000 mg | ORAL_TABLET | Freq: Every day | ORAL | Status: DC

## 2015-01-31 MED ORDER — CHLORDIAZEPOXIDE HCL 10 MG PO CAPS: ORAL_CAPSULE | ORAL | Status: DC

## 2015-01-31 MED ORDER — ADULT MULTIVITAMIN W/MINERALS CH: 1.0000 | ORAL_TABLET | Freq: Every day | ORAL | Status: DC

## 2015-01-31 NOTE — Progress Notes (Signed)
Discharge Note:  Patient alert and oriented X 4 and in no distress.  Discharge instructions given to patient and his son regarding medications, activity, diet, and upcoming appointments.  Patient and his son verbalized understanding of all instructions. Telemetry and peripheral IV discontinued. Patient confirmed that he had all of his personal belongings.  Patient transported out via wheelchair by student nurse.

## 2015-01-31 NOTE — Discharge Instructions (Signed)
Follow with Primary MD Dorothyann Peng, NP in 7 days   Get CBC, CMP, 2 view Chest X ray checked  by Primary MD next visit.    Activity: As tolerated with Full fall precautions use walker/cane & assistance as needed   Disposition Home    Diet: Heart Healthy  , with feeding assistance and aspiration precautions.  For Heart failure patients - Check your Weight same time everyday, if you gain over 2 pounds, or you develop in leg swelling, experience more shortness of breath or chest pain, call your Primary MD immediately. Follow Cardiac Low Salt Diet and 1.5 lit/day fluid restriction.   On your next visit with your primary care physician please Get Medicines reviewed and adjusted.   Please request your Prim.MD to go over all Hospital Tests and Procedure/Radiological results at the follow up, please get all Hospital records sent to your Prim MD by signing hospital release before you go home.   If you experience worsening of your admission symptoms, develop shortness of breath, life threatening emergency, suicidal or homicidal thoughts you must seek medical attention immediately by calling 911 or calling your MD immediately  if symptoms less severe.  You Must read complete instructions/literature along with all the possible adverse reactions/side effects for all the Medicines you take and that have been prescribed to you. Take any new Medicines after you have completely understood and accpet all the possible adverse reactions/side effects.   Do not drive, operating heavy machinery, perform activities at heights, swimming or participation in water activities or provide baby sitting services if your were admitted for syncope or siezures until you have seen by Primary MD or a Neurologist and advised to do so again.  Do not drive when taking Pain medications.    Do not take more than prescribed Pain, Sleep and Anxiety Medications  Special Instructions: If you have smoked or chewed Tobacco  in  the last 2 yrs please stop smoking, stop any regular Alcohol  and or any Recreational drug use.  Wear Seat belts while driving.   Please note  You were cared for by a hospitalist during your hospital stay. If you have any questions about your discharge medications or the care you received while you were in the hospital after you are discharged, you can call the unit and asked to speak with the hospitalist on call if the hospitalist that took care of you is not available. Once you are discharged, your primary care physician will handle any further medical issues. Please note that NO REFILLS for any discharge medications will be authorized once you are discharged, as it is imperative that you return to your primary care physician (or establish a relationship with a primary care physician if you do not have one) for your aftercare needs so that they can reassess your need for medications and monitor your lab values.

## 2015-01-31 NOTE — Evaluation (Signed)
Physical Therapy Evaluation Patient Details Name: Victor Diaz MRN: [id number] DOB: [birth year] Today's Date: 01/31/2015   History of Present Illness  69 yo male with syncope from hyponatremia after EtOH consumption and hypovolemia.    Clinical Impression  Pt was able to demonstrate reasonable gait with no AD, but does need to try stairs if time in facility allows.  His plan is to go home and declines [place] treatment but did talk with him about outpatient if he finds that stretches are not completely solving his issues with ankle ROM.  Will allow pt to continue on these without PT and decide if he needs further intervention.  Pt aware of his fall risk from a-fib as well, having had a major fall with head cut from this in the past.    Follow Up Recommendations No PT follow up    Equipment Recommendations  None recommended by PT    Recommendations for Other Services       Precautions / Restrictions Precautions Precautions: Fall (telemetry) Restrictions Weight Bearing Restrictions: No      Mobility  Bed Mobility Overal bed mobility: Modified Independent             General bed mobility comments: HOB elevated slightly getting out but flat getting back  Transfers Overall transfer level: Modified independent Equipment used: None                Ambulation/Gait Ambulation/Gait assistance: Modified independent (Device/Increase time) Ambulation Distance (Feet): 250 Feet Assistive device: None Gait Pattern/deviations: Decreased stride length;Shuffle;Narrow base of support;Trunk flexed;Step-to pattern Gait velocity: reduced Gait velocity interpretation: Below normal speed for age/gender    Stairs            Wheelchair Mobility    Modified Rankin (Stroke Patients Only)       Balance Overall balance assessment: Needs assistance Sitting-balance support: Feet supported Sitting balance-Leahy Scale: Good   Postural control: Posterior lean Standing balance  support: No upper extremity supported Standing balance-Leahy Scale: Fair                               Pertinent Vitals/Pain Pain Assessment: No/denies pain    Home Living Family/patient expects to be discharged to:: Private residence Living Arrangements: Alone;Other (Comment) (has frequent company, family) Available Help at Discharge: Family Type of Home: House Home Access: Stairs to enter Entrance Stairs-Rails: None Entrance Stairs-Number of Steps: 2 Home Layout: Two level;Other (Comment) (basement for laundry) Home Equipment: Walker - 2 wheels;Bedside commode;Cane - single point      Prior Function Level of Independence: Independent               Hand Dominance        Extremity/Trunk Assessment   Upper Extremity Assessment: Overall WFL for tasks assessed           Lower Extremity Assessment: Overall WFL for tasks assessed (ankles have 0 deg DF)      Cervical / Trunk Assessment: Normal  Communication   Communication: No difficulties  Cognition Arousal/Alertness: Awake/alert Behavior During Therapy: WFL for tasks assessed/performed Overall Cognitive Status: Within Functional Limits for tasks assessed                      General Comments General comments (skin integrity, edema, etc.): Pt was instructed in stretches for ankles to correct his shuffled gait from ROM losses.  He follows instructions well and should be able to  follow up without further PT intervention.    Exercises        Assessment/Plan    PT Assessment Patient needs continued PT services  PT Diagnosis Difficulty walking   PT Problem List Decreased range of motion;Decreased mobility;Decreased coordination;Cardiopulmonary status limiting activity  PT Treatment Interventions DME instruction;Gait training;Stair training;Functional mobility training;Therapeutic activities;Therapeutic exercise;Balance training;Neuromuscular re-education;Patient/family education   PT  Goals (Current goals can be found in the Care Plan section) Acute Rehab PT Goals Patient Stated Goal: to go home asap PT Goal Formulation: With patient Time For Goal Achievement: 02/07/15 Potential to Achieve Goals: Good    Frequency Min 2X/week   Barriers to discharge Decreased caregiver support Has sporadic help for home    Co-evaluation               End of Session   Activity Tolerance: Patient tolerated treatment well Patient left: in chair;with call bell/phone within reach Nurse Communication: Mobility status         Time: 3716-9678 PT Time Calculation (min) (ACUTE ONLY): 28 min   Charges:   PT Evaluation $Initial PT Evaluation Tier I: 1 Procedure PT Treatments $Gait Training: 8-22 mins   PT G Codes:        Ramond Dial February 24, 2015, 1:24 PM   Mee Hives, PT MS Acute Rehab Dept. Number: [facility] [id number] and [place] [phone]

## 2015-01-31 NOTE — Progress Notes (Signed)
Utilization Review Completed.Victor Diaz T10/16/2016  

## 2015-01-31 NOTE — Discharge Summary (Signed)
Victor Diaz, is a 69 y.o. male  DOB 05-31-1945  MRN [id number].  Admission date:  01/29/2015  Admitting Physician  Theressa Millard, MD  Discharge Date:  01/31/2015   Primary MD  Dorothyann Peng, NP  Recommendations for primary care physician for things to follow:  - Check basic labs including CBC, BMP during next visit    Admission Diagnosis  Dehydration [E86.0] Hyponatremia [E87.1] ETOH abuse [F10.10] Syncope, unspecified syncope type [R55]   Discharge Diagnosis  Dehydration [E86.0] Hyponatremia [E87.1] ETOH abuse [F10.10] Syncope, unspecified syncope type [R55]   Principal Problem:   Hyponatremia Active Problems:   Syncope   ETOH abuse   Diastolic dysfunction   Hyperlipidemia   Faintness      Past Medical History  Diagnosis Date  . Allergy   . Gout   . Hyperlipidemia   . EtOH dependence ([place])   . Hemothorax on right   . Lisfranc's dislocation 10/20/2011  . Right fibular fracture 10/20/2011    Past Surgical History  Procedure Laterality Date  . Appendectomy    . Hernia repair    . Tonsilectomy, adenoidectomy, bilateral myringotomy and tubes    . 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 Partial hip arthroplasty Left 2010  . Peripheral vascular catheterization N/A 11/11/2014    Procedure: Aortic Arch Angiography/Left Subclavion Stent;  Surgeon: Serafina Mitchell, MD;  Location: [facility];  Service: Cardiovascular;  Laterality: N/A;       History of present illness and  Hospital Course:     Kindly see H&P for history of present illness and admission details, please review complete Labs, Consult reports and Test reports for all details in brief  HPI  from the history and physical done on the day of admission Victor Diaz is a 69 y.o. male with a history of ETOH dependence who presents to the ED with complaints of passing out while sitting in a parked car.  He reports feeling bad all day today , and felt light headed, and he had N+V x 2 and then passed out for about 1 minute. His son was in the car and witnessed the event. Patient reports that he did drink 7 or 8 beers today, and he reports that he regularly drinks 12 beers daily.    Hospital Course   Hyponatremia - Most likely related to beer potomania, baseline sodium is chronically on the lower side, he reports  drinking 12 cans of beers on a daily basis. - Resolved with IV fluids, sodium is 136 on discharge  Syncope - Most likely due to hypovolemia, and alcohol intoxication, alcohol level was 99 on admission. - No events on telemetry, recent echo with EF 60%, has history of SVT in the past, but no recurrence on telemetry, as well already on beta blockers.  Alcohol abuse/intoxication - Was on CIWA protocol during hospital stay, we'll discharge on Librium taper. - Continue with thiamine and folic acid and multivitamin on discharge - Counseled at length  Diastolic dysfunction -  Appears to be euvolemic on discharge  Hyperlipidemia - Continue statin  Hypertension - Continue with metoprolol  History of a flutter - Sinus rhythm during hospital stay, metoprolol, not a candidate for anticoagulation due to heavy alcohol use (questionable compliance as well for risk)  History of left subclavian stenosis - Status post left subclavian stent by Dr. Trula Slade on 10/2014   Discharge Condition:  Stable   Follow UP  Follow-up Information    Follow up with Dorothyann Peng, NP. Call in 1 week.   Specialty:  Family Medicine   Why:  post hospitalization follow-up   Contact information:   [place] [address] [phone]         Discharge Instructions  and  Discharge Medications     Discharge Instructions    Diet - low sodium heart healthy    Complete by:  As directed      Discharge instructions    Complete by:  As directed   Follow with Primary MD Dorothyann Peng, NP in 7 days   Get CBC, CMP, 2 view Chest X ray checked  by Primary MD next visit.    Activity: As tolerated with Full fall precautions use walker/cane & assistance as needed   Disposition Home **   Diet: Heart Healthy ** , with feeding assistance and aspiration precautions.  For Heart failure patients - Check your Weight same time everyday, if you gain over 2 pounds, or you develop in leg swelling, experience more shortness of breath or chest pain, call your Primary MD immediately. Follow Cardiac Low Salt Diet and 1.5 lit/day fluid restriction.   On your next visit with your primary care physician please Get Medicines reviewed and adjusted.   Please request your Prim.MD to go over all Hospital Tests and Procedure/Radiological results at the follow up, please get all Hospital records sent to your Prim MD by signing hospital release before you go home.   If you experience worsening of your admission symptoms, develop shortness of breath, life threatening emergency, suicidal or homicidal thoughts you must seek medical attention immediately by calling 911 or calling your MD immediately  if symptoms less severe.  You Must read complete instructions/literature along with all the possible adverse reactions/side effects for all the Medicines you take and that have been prescribed to you. Take any new Medicines after you have completely understood and accpet all the possible adverse reactions/side effects.   Do not drive, operating heavy machinery, perform activities at heights, swimming or participation in water activities or provide baby sitting services if your were admitted for syncope or siezures until you have seen by Primary MD or a Neurologist and advised to do so again.  Do not drive when taking Pain medications.    Do not take more than prescribed Pain, Sleep and Anxiety Medications  Special Instructions: If you have smoked or chewed Tobacco  in the last 2 yrs please stop  smoking, stop any regular Alcohol  and or any Recreational drug use.  Wear Seat belts while driving.   Please note  You were cared for by a hospitalist during your hospital stay. If you have any questions about your discharge medications or the care you received while you were in the hospital after you are discharged, you can call the unit and asked to speak with the hospitalist on call if the hospitalist that took care of you is not available. Once you are discharged, your primary care physician will handle any further  medical issues. Please note that NO REFILLS for any discharge medications will be authorized once you are discharged, as it is imperative that you return to your primary care physician (or establish a relationship with a primary care physician if you do not have one) for your aftercare needs so that they can reassess your need for medications and monitor your lab values.     Increase activity slowly    Complete by:  As directed             Medication List    TAKE these medications        aspirin 325 MG tablet  Take 325 mg by mouth daily.     cetirizine 10 MG tablet  Commonly known as:  ZYRTEC  Take 10 mg by mouth daily.     chlordiazePOXIDE 10 MG capsule  Commonly known as:  LIBRIUM  Please dispense 18 pills - Take 1 pill three times a day for 3 days, then Take 1 pill two times a day for 3 days, then Take 1 pill once a day for 3 days and stop.     diazepam 2 MG tablet  Commonly known as:  VALIUM  Take 1-2 tablets (2-4 mg total) by mouth daily as needed for anxiety. Take 1/2 tablet by mouth daily prn.     folic acid 1 MG tablet  Commonly known as:  FOLVITE  Take 1 tablet (1 mg total) by mouth daily.     metoprolol succinate 25 MG 24 hr tablet  Commonly known as:  TOPROL-XL  Take 2 tablets (50 mg total) by mouth daily.     multivitamin with minerals Tabs tablet  Take 1 tablet by mouth daily.     multivitamin with minerals Tabs tablet  Take 1 tablet by mouth  daily.     omeprazole 20 MG capsule  Commonly known as:  PRILOSEC  TAKE 1 CAPSULE BY MOUTH DAILY AS NEEDED FOR HEART BURN.     simvastatin 40 MG tablet  Commonly known as:  ZOCOR  TAKE (1) TABLET BY MOUTH AT BEDTIME.     thiamine 100 MG tablet  Take 1 tablet (100 mg total) by mouth daily.          Diet and Activity recommendation: See Discharge Instructions above   Consults obtained -  None   Major procedures and Radiology Reports - PLEASE review detailed and final reports for all details, in brief -      No results found.  Micro Results     No results found for this or any previous visit (from the past 240 hour(s)).     Today   Subjective:   Victor Diaz today has no headache,no chest abdominal pain,no new weakness tingling or numbness, feels much better wants to go home today.   Objective:   Blood pressure 155/77, pulse 79, temperature 97.3 F (36.3 C), temperature source Oral, resp. rate 18, height 5\' 6"  (1.676 m), weight 94.303 kg (207 lb 14.4 oz), SpO2 97 %.   Intake/Output Summary (Last 24 hours) at 01/31/15 1051 Last data filed at 01/31/15 0834  Gross per 24 hour  Intake    960 ml  Output   2350 ml  Net  -1390 ml    Exam Awake Alert, Oriented x 3, No new F.N deficits, Normal affect [place].AT,PERRAL Supple Neck,No JVD, No cervical lymphadenopathy appriciated.  Symmetrical Chest wall movement, Good air movement bilaterally, CTAB RRR,No Gallops,Rubs or new Murmurs, No Parasternal Heave +ve B.Sounds, Abd  Soft, Non tender, No organomegaly appriciated, No rebound -guarding or rigidity. No Cyanosis, Clubbing or edema, No new Rash or bruise  Data Review   CBC w Diff: Lab Results  Component Value Date   WBC 3.8* 01/30/2015   WBC 15.1* 01/27/2012   HGB 13.2 01/30/2015   HGB 14.8 01/27/2012   HCT 38.9* 01/30/2015   HCT 42.1 01/27/2012   PLT 125* 01/30/2015   PLT 138* 01/27/2012   LYMPHOPCT 26 10/14/2014   MONOPCT 9 10/14/2014   EOSPCT 2  10/14/2014   BASOPCT 1 10/14/2014    CMP: Lab Results  Component Value Date   NA 136 01/31/2015   NA 132* 01/27/2012   K 4.2 01/31/2015   K 4.5 01/27/2012   CL 104 01/31/2015   CL 98 01/27/2012   CO2 24 01/31/2015   CO2 24 01/27/2012   BUN 10 01/31/2015   BUN 12 01/27/2012   CREATININE 1.13 01/31/2015   CREATININE 1.11 01/27/2012   PROT 6.2* 10/17/2014   ALBUMIN 3.3* 10/17/2014   BILITOT 0.3 10/17/2014   ALKPHOS 30* 10/17/2014   AST 53* 10/17/2014   ALT 46 10/17/2014  .   Total Time in preparing paper work, data evaluation and todays exam - 35 minutes  Caron Ode M.D on 01/31/2015 at 10:51 AM  [facility]   Office  [phone]

## 2015-02-03 ENCOUNTER — Encounter: Payer: Self-pay | Admitting: Surgery

## 2015-02-08 ENCOUNTER — Ambulatory Visit: Payer: Medicare Other | Admitting: Surgery

## 2015-02-08 ENCOUNTER — Encounter (HOSPITAL_COMMUNITY): Payer: Medicare Other

## 2015-02-18 ENCOUNTER — Encounter: Payer: Self-pay | Admitting: Surgery

## 2015-02-22 ENCOUNTER — Encounter (HOSPITAL_COMMUNITY): Payer: Medicare Other

## 2015-02-22 ENCOUNTER — Ambulatory Visit: Payer: Medicare Other | Admitting: Surgery

## 2015-04-15 ENCOUNTER — Encounter: Payer: Self-pay | Admitting: Surgery

## 2015-04-23 ENCOUNTER — Other Ambulatory Visit: Payer: Self-pay | Admitting: Surgery

## 2015-04-23 ENCOUNTER — Other Ambulatory Visit: Payer: Self-pay

## 2015-04-23 DIAGNOSIS — I771 Stricture of artery: Secondary | ICD-10-CM

## 2015-04-23 DIAGNOSIS — Z48812 Encounter for surgical aftercare following surgery on the circulatory system: Secondary | ICD-10-CM

## 2015-04-23 DIAGNOSIS — I6523 Occlusion and stenosis of bilateral carotid arteries: Secondary | ICD-10-CM

## 2015-04-23 MED ORDER — SIMVASTATIN 40 MG PO TABS: ORAL_TABLET | ORAL | Status: DC

## 2015-04-23 NOTE — Telephone Encounter (Signed)
Rx request for simvastatin.  Rx sent to pharmacy.

## 2015-04-26 ENCOUNTER — Ambulatory Visit: Payer: Medicare Other | Admitting: Surgery

## 2015-04-26 ENCOUNTER — Encounter (HOSPITAL_COMMUNITY): Payer: Medicare Other

## 2015-05-17 ENCOUNTER — Encounter: Payer: Self-pay | Admitting: Surgery

## 2015-05-24 ENCOUNTER — Encounter (HOSPITAL_COMMUNITY): Payer: Medicare Other

## 2015-05-24 ENCOUNTER — Ambulatory Visit: Payer: Medicare Other | Admitting: Surgery

## 2015-06-09 ENCOUNTER — Encounter: Payer: Self-pay | Admitting: Surgery

## 2015-06-14 ENCOUNTER — Encounter (HOSPITAL_COMMUNITY): Payer: Medicare Other

## 2015-06-14 ENCOUNTER — Ambulatory Visit: Payer: Medicare Other | Admitting: Surgery

## 2015-06-15 ENCOUNTER — Encounter: Payer: Self-pay | Admitting: Surgery

## 2015-06-21 ENCOUNTER — Encounter: Payer: Self-pay | Admitting: Surgery

## 2015-06-21 ENCOUNTER — Other Ambulatory Visit: Payer: Self-pay | Admitting: *Deleted

## 2015-06-21 ENCOUNTER — Ambulatory Visit (INDEPENDENT_AMBULATORY_CARE_PROVIDER_SITE_OTHER): Payer: Medicare Other | Admitting: Surgery

## 2015-06-21 ENCOUNTER — Ambulatory Visit (HOSPITAL_COMMUNITY)
Admission: RE | Admit: 2015-06-21 | Discharge: 2015-06-21 | Disposition: A | Discharge status: Home / Self Care | Payer: Medicare Other | Source: Ambulatory Visit | Attending: Surgery | Admitting: Surgery

## 2015-06-21 VITALS — BP 156/83 | HR 64 | Temp 97.6°F | Resp 18 | Ht 66.0 in | Wt 204.0 lb

## 2015-06-21 DIAGNOSIS — I6523 Occlusion and stenosis of bilateral carotid arteries: Secondary | ICD-10-CM | POA: Insufficient documentation

## 2015-06-21 DIAGNOSIS — Z48812 Encounter for surgical aftercare following surgery on the circulatory system: Secondary | ICD-10-CM | POA: Diagnosis not present

## 2015-06-21 DIAGNOSIS — G458 Other transient cerebral ischemic attacks and related syndromes: Secondary | ICD-10-CM | POA: Diagnosis not present

## 2015-06-21 DIAGNOSIS — E785 Hyperlipidemia, unspecified: Secondary | ICD-10-CM | POA: Diagnosis not present

## 2015-06-21 DIAGNOSIS — I708 Atherosclerosis of other arteries: Secondary | ICD-10-CM | POA: Diagnosis not present

## 2015-06-21 DIAGNOSIS — I771 Stricture of artery: Secondary | ICD-10-CM

## 2015-06-21 NOTE — Progress Notes (Signed)
HISTORY AND PHYSICAL     CC:  F/u for left subclavian stent Referring Provider:  Dorothyann Peng, NP  HPI: This is a 70 y.o. male who was having syncope and within the differential, was left subclavian steal.  He underwent placement of left subclavian stent on 11/11/14. He is following up today.  He states he is still having dizzy spells.  In October, he states he was in [place] and was walking to his car when he vomited on himself and passed out.  He states the was admitted to the hospital at that time.   The note from that time, states he had reported that he had 7-8 beers that day.   His syncope at that time was felt to be most likely from hypovolemia and was treated with IVF.  The pt states that he has been having chest pain at night with occasional relief with anti-reflux medication.  He states that he continues to drink alcohol.  He states he has fallen in the past 2 weeks.    He does have a hx of A flutter and is on a beta blocker, but not felt to be an anticoagulation candidate d/t his etoh use and risk of falls.    He is on a daily aspirin.  He is on a beta blocker.  He does take a statin for his cholesterol management.  He is on a PPI for his heartburn.    Past Medical History  Diagnosis Date  . Allergy   . Gout   . Hyperlipidemia   . EtOH dependence ([place])   . Hemothorax on right   . Lisfranc's dislocation 10/20/2011  . Right fibular fracture 10/20/2011    Past Surgical History  Procedure Laterality Date  . Appendectomy    . Hernia repair    . Tonsilectomy, adenoidectomy, bilateral myringotomy and tubes    . 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 Partial hip arthroplasty Left 2010  . Peripheral vascular catheterization N/A 11/11/2014    Procedure: Aortic Arch Angiography/Left Subclavion Stent;  Surgeon: Serafina Mitchell, MD;  Location: [facility];  Service: Cardiovascular;  Laterality: N/A;    Allergies  Allergen Reactions  . Atorvastatin Anaphylaxis   LIPITOR -  facial swelling    Current Outpatient Prescriptions  Medication Sig Dispense Refill  . aspirin 325 MG tablet Take 325 mg by mouth daily.    . cetirizine (ZYRTEC) 10 MG tablet Take 10 mg by mouth daily.     . metoprolol succinate (TOPROL-XL) 25 MG 24 hr tablet Take 2 tablets (50 mg total) by mouth daily. 90 tablet 2  . Multiple Vitamin (MULTIVITAMIN WITH MINERALS) TABS tablet Take 1 tablet by mouth daily. 30 tablet 0  . omeprazole (PRILOSEC) 20 MG capsule TAKE 1 CAPSULE BY MOUTH DAILY AS NEEDED FOR HEART BURN. 90 capsule 1  . simvastatin (ZOCOR) 40 MG tablet TAKE (1) TABLET BY MOUTH AT BEDTIME. 90 tablet 1  . chlordiazePOXIDE (LIBRIUM) 10 MG capsule Please dispense 18 pills - Take 1 pill three times a day for 3 days, then Take 1 pill two times a day for 3 days, then Take 1 pill once a day for 3 days and stop. (Patient not taking: Reported on 06/21/2015) 18 capsule 0  . diazepam (VALIUM) 2 MG tablet Take 1-2 tablets (2-4 mg total) by mouth daily as needed for anxiety. Take 1/2 tablet by mouth daily prn. (Patient not taking: Reported on 06/21/2015) 30 tablet 0  . folic acid (FOLVITE)  1 MG tablet Take 1 tablet (1 mg total) by mouth daily. (Patient not taking: Reported on 06/21/2015) 30 tablet 0  . thiamine 100 MG tablet Take 1 tablet (100 mg total) by mouth daily. (Patient not taking: Reported on 06/21/2015) 30 tablet 0   No current facility-administered medications for this visit.    Family History  Problem Relation Age of Onset  . Arthritis Mother   . Cerebral aneurysm Mother   . Tuberculosis Father     Social History   Social History  . Marital Status: Widowed    Spouse Name: N/A  . Number of Children: N/A  . Years of Education: N/A   Occupational History  . Not on file.   Social History Main Topics  . Smoking status: Former Smoker -- 1.00 packs/day for 10 years    Types: Cigarettes    Quit date: 08/09/1969  . Smokeless tobacco: Never Used  . Alcohol Use: 37.8 oz/week     63 Cans of beer per week     Comment: Daily  . Drug Use: No  . Sexual Activity: Not on file   Other Topics Concern  . Not on file   Social History Narrative   Lives in [place],  Has two sons who live with him   He is no longer working - lost his license.    He does not have a valid driver's license because of alcohol-related in fractions      Son drives him ;has MS           REVIEW OF SYSTEMS:   [X]  denotes positive finding, [ ]  denotes negative finding Cardiac  Comments:  Chest pain or chest pressure: x See HPI  Shortness of breath upon exertion:    Short of breath when lying flat:    Irregular heart rhythm: x Hx A flutter      Vascular    Pain in calf, thigh, or hip brought on by ambulation:    Pain in feet at night that wakes you up from your sleep:     Blood clot in your veins:    Leg swelling:         Pulmonary    Oxygen at home:    Productive cough:     Wheezing:         Neurologic    Sudden weakness in arms or legs:     Sudden numbness in arms or legs:     Sudden onset of difficulty speaking or slurred speech:    Temporary loss of vision in one eye:     Problems with dizziness:  x Fallen in the past 2 weeks      Gastrointestinal    Blood in stool:     Vomited blood:         Genitourinary    Burning when urinating:     Blood in urine:        Psychiatric    Major depression:         Hematologic    Bleeding problems:    Problems with blood clotting too easily:        Skin    Rashes or ulcers:        Constitutional    Fever or chills:      PHYSICAL EXAMINATION:  Filed Vitals:   06/21/15 1454 06/21/15 1457  BP: 166/89 156/83  Pulse: 69 64  Temp:  97.6 F (36.4 C)  Resp: 18    Body mass index is  32.94 kg/(m^2).  General:  WDWN in NAD; vital signs documented above Gait: Not observed HENT: WNL, normocephalic Pulmonary: normal non-labored breathing , without Rales, rhonchi,  wheezing Cardiac: regular HR, without  Murmurs, rubs or  gallops; without carotid bruits Abdomen: soft, NT, no masses Skin: without rashes Vascular Exam/Pulses:  Right Left  Radial 2+ (normal) 2+ (normal)  DP 2+ (normal) 2+ (normal)   Extremities: without ischemic changes, without Gangrene , without cellulitis; without open wounds;  Musculoskeletal: no muscle wasting or atrophy  Neurologic: A&O X 3;  No focal weakness or paresthesias are detected    Non-Invasive Vascular Imaging:   Cerebrovascular duplex exam 06/21/15: 1.  40-59% bilateral ICA stenosis, calcific plaque may obscure higher velocity 2.  Patent left subclavian stent.    Pt meds includes: Statin:  Yes.   Beta Blocker:  Yes.   Aspirin:  Yes.   ACEI:  No. ARB:  No. Other Antiplatelet/Anticoagulant:  No.    ASSESSMENT/PLAN:: 70 y.o. male who is s/p left subclavian stent placement 11/11/14 by Dr. Trula Slade   -pt's subclavian stent is patent and he has a palpable left radial pulse.  -he does continue to have episodes with dizziness.  He states that he continues to drink alcohol and this could be contributing to his falls. -he states that he has been having more chest pain with occasional relief with anti reflux medication.   -he states that he has not see Dr. Domenic Polite in a while-we will set up an appointment for him to be seen in the next week.  I discussed with the pt if he has more severe chest pain, that he needs to call 911 and he expressed understanding.  -we will see him back in one year with carotid duplex and evaluation of his subclavian stent.    Leontine Locket, PA-C [facility] [phone]  Clinic MD:  Pt seen and examined in conjunction with Dr. Trula Slade   I agree with the above.  I have seen and evaluated the patient. His subclavian stent is widely patent by ultrasound today.  He also has a palpable pulse. I do not think his current neurologic symptoms are related to his subclavian stenosis.  I'm concerned that he may have some underlying cardiac  issues.  He has not seen cardiology since he has been discharged from the hospital proximal in 9 months ago.  Therefore I'm getting him reevaluated by his cardiologist in the immediate future.  He will follow up with me in one year for repeat ultrasound of his stent.  Annamarie Major

## 2015-06-23 ENCOUNTER — Encounter: Payer: Self-pay | Admitting: *Deleted

## 2015-06-23 ENCOUNTER — Encounter: Payer: Self-pay | Admitting: Physician Assistant

## 2015-06-23 ENCOUNTER — Ambulatory Visit (INDEPENDENT_AMBULATORY_CARE_PROVIDER_SITE_OTHER): Payer: Medicare Other | Admitting: Physician Assistant

## 2015-06-23 VITALS — BP 142/72 | HR 69 | Ht 66.0 in | Wt 202.0 lb

## 2015-06-23 DIAGNOSIS — R079 Chest pain, unspecified: Secondary | ICD-10-CM | POA: Diagnosis not present

## 2015-06-23 DIAGNOSIS — I4892 Unspecified atrial flutter: Secondary | ICD-10-CM

## 2015-06-23 DIAGNOSIS — I5189 Other ill-defined heart diseases: Secondary | ICD-10-CM

## 2015-06-23 DIAGNOSIS — I519 Heart disease, unspecified: Secondary | ICD-10-CM | POA: Diagnosis not present

## 2015-06-23 DIAGNOSIS — F1010 Alcohol abuse, uncomplicated: Secondary | ICD-10-CM | POA: Diagnosis not present

## 2015-06-23 DIAGNOSIS — R55 Syncope and collapse: Secondary | ICD-10-CM

## 2015-06-23 DIAGNOSIS — I6523 Occlusion and stenosis of bilateral carotid arteries: Secondary | ICD-10-CM

## 2015-06-23 NOTE — Assessment & Plan Note (Signed)
Patient had surgery for subclavian steal and recent evaluation by Dr. Trula Slade that shows patent left subclavian stent. He does not eat properly and drinks a lot of alcohol which seems to contribute to his dizziness and syncope in the past. We'll start out with the stress test to evaluate and have him follow-up with Dr. Domenic Polite in 2 weeks. He's had Holter monitor in the past documenting atrial flutter.

## 2015-06-23 NOTE — Patient Instructions (Signed)
Your physician recommends that you schedule a follow-up appointment in: 2 Weeks with Dr. Domenic Polite  Your physician recommends that you continue on your current medications as directed. Please refer to the Current Medication list given to you today.  Your physician has requested that you have a lexiscan myoview. For further information please visit [email or web address]. Please follow instruction sheet, as given.  Your physician recommends that you return for lab work Fasting ( Nothing to eat or drink after midnight)  If you need a refill on your cardiac medications before your next appointment, please call your pharmacy.  Thank you for choosing [facility]!

## 2015-06-23 NOTE — Assessment & Plan Note (Signed)
No evidence of heart failure on exam 

## 2015-06-23 NOTE — Assessment & Plan Note (Signed)
Patient complains of chest pain after he gets up to go to the bathroom in the middle of the night and then lays back down. It can last 1-2 hours. He does have risk factors for CAD including hypertension, hyperlipidemia. We'll order a stress Myoview to rule out ischemia.

## 2015-06-23 NOTE — Progress Notes (Signed)
Cardiology Office Note   Date:  06/23/2015   ID:  Victor Diaz, DOB Oct 28, 1945, MRN [id number]  PCP:  Dorothyann Peng, NP  Cardiologist:  Dr. Domenic Polite  Chief Complaint: Chest pain    History of Present Illness: Victor Diaz is a 70 y.o. male who presents for chest pain. Patient has a history of atrial flutter and has been seen in consult by Dr. Domenic Polite in the hospital but he has never shown up in the office for follow-up. He had recurrent atrial flutter just before the start of left subclavian stent procedure and converted to normal sinus rhythm after several doses of IV Lopressor. He was admitted in June 2016 with syncope and has hit his head several times. He drinks a lot of beer and has been admitted to several rehabilitation facilities. He also drinks 12 or more cups of coffee daily. 48 hour Holter monitor showed atrial flutter with very short runs of SVT and occasional PVCs. He was not felt to be a good candidate for anticoagulation and Chadsvasc=3.. He was maintained on metoprolol. He had a 2-D echo 09/2014 normal LV function EF 55-60% with grade 2 diastolic dysfunction, basal inferolateral akinesis, mild aortic stenosis and AI trivial TR.  Patient comes in today complaining of chest pain. He describes it as chest tightness that has gone down his arm twice. It usually occurs when he gets up to use the bathroom at 2 AM and then lays back down. It can last 1-2 hours. He also has excessive gas at night. He denies any associated dyspnea, diaphoresis, dizziness or presyncope with the chest pain. He does not have chest pain during the day. He says is not related to food. He continues to have dizziness and presyncopal symptoms throughout the day. He says he takes care of his 59-year-old and 51-month-old great grandchildren and has been fearful that he is going to pass out. Occasionally he gets dizzy and has to sit for a while. He also continues to drink 6-12 beers a day and admits to drinking well  caring for the great grandchildren. I told him he should not be caring for these children with his situation and he concurs but doesn't know what to do about it. Occasionally he has a grandson in the home but he says he's hard to wake up or get a hold of.    Past Medical History  Diagnosis Date  . Allergy   . Gout   . Hyperlipidemia   . EtOH dependence ([place])   . Hemothorax on right   . Lisfranc's dislocation 10/20/2011  . Right fibular fracture 10/20/2011    Past Surgical History  Procedure Laterality Date  . Appendectomy    . Hernia repair    . Tonsilectomy, adenoidectomy, bilateral myringotomy and tubes    . 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 Partial hip arthroplasty Left 2010  . Peripheral vascular catheterization N/A 11/11/2014    Procedure: Aortic Arch Angiography/Left Subclavion Stent;  Surgeon: Serafina Mitchell, MD;  Location: [facility];  Service: Cardiovascular;  Laterality: N/A;     Current Outpatient Prescriptions  Medication Sig Dispense Refill  . aspirin 325 MG tablet Take 325 mg by mouth daily.    . cetirizine (ZYRTEC) 10 MG tablet Take 10 mg by mouth daily.     . metoprolol succinate (TOPROL-XL) 25 MG 24 hr tablet Take 2 tablets (50 mg total) by mouth daily. 90 tablet 2  . Multiple Vitamin (MULTIVITAMIN WITH MINERALS) TABS tablet  Take 1 tablet by mouth daily. 30 tablet 0  . omeprazole (PRILOSEC) 20 MG capsule TAKE 1 CAPSULE BY MOUTH DAILY AS NEEDED FOR HEART BURN. 90 capsule 1  . simvastatin (ZOCOR) 40 MG tablet TAKE (1) TABLET BY MOUTH AT BEDTIME. 90 tablet 1   No current facility-administered medications for this visit.    Allergies:   Atorvastatin    Social History:  The patient  reports that he quit smoking about 45 years ago. His smoking use included Cigarettes. He has a 10 pack-year smoking history. He has never used smokeless tobacco. He reports that he drinks about 37.8 oz of alcohol per week. He reports that he does not use illicit drugs.    Family History:  The patient's    family history includes Arthritis in his mother; Cerebral aneurysm in his mother; Tuberculosis in his father.    ROS:  Please see the history of present illness.   Otherwise, review of systems are positive for none.   All other systems are reviewed and negative.    PHYSICAL EXAM: VS:  BP 142/72 mmHg  Pulse 69  Ht 5\' 6"  (1.676 m)  Wt 202 lb (91.627 kg)  BMI 32.62 kg/m2  SpO2 98% , BMI Body mass index is 32.62 kg/(m^2). GEN: Well nourished, well developed, in no acute distress Neck: no JVD, HJR, carotid bruits, or masses Cardiac: RRR; 2/6 systolic murmur at the left sternal border, positive S4, no rubs, thrill or heave,  Respiratory:  clear to auscultation bilaterally, normal work of breathing GI: soft, nontender, nondistended, + BS MS: no deformity or atrophy Extremities: without cyanosis, clubbing, edema, good distal pulses bilaterally.  Skin: warm and dry, no rash Neuro:  Strength and sensation are intact    EKG:  EKG is ordered today. The ekg ordered today demonstrates normal sinus rhythm with LVH, no acute change   Recent Labs: 10/14/2014: TSH 3.418 10/17/2014: ALT 46 01/30/2015: Hemoglobin 13.2; Platelets 125* 01/31/2015: BUN 10; Creatinine, Ser 1.13; Magnesium 1.9; Potassium 4.2; Sodium 136    Lipid Panel    Component Value Date/Time   CHOL 160 11/19/2013 1056   TRIG 178.0* 11/19/2013 1056   TRIG 67 04/05/2006 1012   HDL 36.50* 11/19/2013 1056   CHOLHDL 4 11/19/2013 1056   CHOLHDL 3.2 CALC 04/05/2006 1012   VLDL 35.6 11/19/2013 1056   LDLCALC 88 11/19/2013 1056   LDLDIRECT 85.3 12/06/2012 1457      Wt Readings from Last 3 Encounters:  06/23/15 202 lb (91.627 kg)  06/21/15 204 lb (92.534 kg)  01/29/15 207 lb 14.4 oz (94.303 kg)      Other studies Reviewed: Additional studies/ records that were reviewed today include and review of the records demonstrates:  2-D echo 09/2014 Study Conclusions   - Left ventricle: The  cavity size was normal. Wall thickness was   normal. Systolic function was normal. The estimated ejection   fraction was in the range of 55% to 60%. There is akinesis of the   basalinferolateral myocardium. Features are consistent with a   pseudonormal left ventricular filling pattern, with concomitant   abnormal relaxation and increased filling pressure (grade 2   diastolic dysfunction). - Aortic valve: There was mild stenosis. There was trivial   regurgitation. Mean gradient (S): 7 mm Hg. Peak gradient (S): 15   mm Hg. Valve area by planimetry 1.7 cm2. - Mitral valve: Calcified annulus. There was trivial regurgitation. - Left atrium: The atrium was at the upper limits of normal in  size. - Right atrium: Central venous pressure (est): 3 mm Hg. - Tricuspid valve: There was trivial regurgitation. - Pulmonary arteries: PA peak pressure: 27 mm Hg (S). - Pericardium, extracardiac: There was no pericardial effusion.   Impressions:   - Normal LV wall thickness with LVEF 0000000, grade 2 diastolic   dysfunction. Basal inferolateral akinesis noted. Upper normal   left atrial size. Mild aortic stenosis as outlined. Mild aortic   regurgitation. Trivial tricuspid regurgitation with PASP 27 mmHg.   Non-Invasive Vascular Imaging:   Cerebrovascular duplex exam 06/21/15: 1.  40-59% bilateral ICA stenosis, calcific plaque may obscure higher velocity 2.  Patent left subclavian stent.       ASSESSMENT AND PLAN:  Chest pain Patient complains of chest pain after he gets up to go to the bathroom in the middle of the night and then lays back down. It can last 1-2 hours. He does have risk factors for CAD including hypertension, hyperlipidemia. We'll order a stress Myoview to rule out ischemia.  Atrial flutter Patient occasionally feels fluttering but is in normal sinus rhythm and maintained on metoprolol. Not on anticoagulation candidate because of high alcohol use and frequent falls.CHADSVASC=3  ETOH  abuse Patient continues to drink 6-12 beers a day. He admits to drinking while caring for his great grandchildren who are 83 years old and 77 months old. He also admits to being fearful of passing out as he's had recurrent syncope. Sometimes it is her grandson in the house but he says he has a difficult time getting a hold of him and he has not father of the great grandchildren.I discussed the need for him to stop caring for these children because of his alcohol and syncope. He concurs but isn't sure how he is going to stop. We have contacted DSS to evaluate the situation and safety of these children. Recommend decrease alcohol intake.  Diastolic dysfunction No evidence of heart failure on exam.  Syncope Patient had surgery for subclavian steal and recent evaluation by Dr. Trula Slade that shows patent left subclavian stent. He does not eat properly and drinks a lot of alcohol which seems to contribute to his dizziness and syncope in the past. We'll start out with the stress test to evaluate and have him follow-up with Dr. Domenic Polite in 2 weeks. He's had Holter monitor in the past documenting atrial flutter.    Signed, Ermalinda Barrios, PA-C  06/23/2015 1:48 PM    [facility] [address], [place], [place]  [address] Phone: [phone]; Fax: [phone]

## 2015-06-23 NOTE — Assessment & Plan Note (Addendum)
Patient continues to drink 6-12 beers a day. He admits to drinking while caring for his great grandchildren who are 72 years old and 73 months old. He also admits to being fearful of passing out as he's had recurrent syncope. Sometimes it is her grandson in the house but he says he has a difficult time getting a hold of him and he has not father of the great grandchildren.I discussed the need for him to stop caring for these children because of his alcohol and syncope. He concurs but isn't sure how he is going to stop. We have contacted DSS to evaluate the situation and safety of these children. Recommend decrease alcohol intake.

## 2015-06-23 NOTE — Assessment & Plan Note (Addendum)
Patient occasionally feels fluttering but is in normal sinus rhythm and maintained on metoprolol. Not on anticoagulation candidate because of high alcohol use and frequent falls.CHADSVASC=3

## 2015-07-02 ENCOUNTER — Encounter (HOSPITAL_COMMUNITY): Payer: Medicare Other

## 2015-07-02 ENCOUNTER — Inpatient Hospital Stay (HOSPITAL_COMMUNITY): Admission: RE | Admit: 2015-07-02 | Payer: Medicare Other | Source: Ambulatory Visit

## 2015-07-02 ENCOUNTER — Ambulatory Visit (HOSPITAL_COMMUNITY): Admission: RE | Admit: 2015-07-02 | Payer: Medicare Other | Source: Ambulatory Visit

## 2015-07-05 ENCOUNTER — Other Ambulatory Visit: Payer: Self-pay | Admitting: Adult Health

## 2015-07-05 ENCOUNTER — Telehealth: Payer: Self-pay | Admitting: Adult Health

## 2015-07-05 NOTE — Telephone Encounter (Signed)
Ok to refill Metoprolol for 90 days, needs office appointment for another refills after that.   Zyrtec is over the counter

## 2015-07-05 NOTE — Telephone Encounter (Signed)
Ok to refill 

## 2015-07-05 NOTE — Telephone Encounter (Signed)
Pt needs refills on cetirizine 10 mg #90 w/refills and metoprolol 25 mg twice a day #60 [facility]. Pt is out

## 2015-07-05 NOTE — Telephone Encounter (Signed)
Rx sent. Thanks

## 2015-07-13 ENCOUNTER — Inpatient Hospital Stay (HOSPITAL_COMMUNITY): Admission: RE | Admit: 2015-07-13 | Payer: Medicare Other | Source: Ambulatory Visit

## 2015-07-13 ENCOUNTER — Encounter (HOSPITAL_COMMUNITY)
Admission: RE | Admit: 2015-07-13 | Discharge: 2015-07-13 | Disposition: A | Discharge status: Home / Self Care | Payer: Medicare Other | Source: Ambulatory Visit | Attending: Physician Assistant | Admitting: Physician Assistant

## 2015-07-13 ENCOUNTER — Telehealth: Payer: Self-pay | Admitting: *Deleted

## 2015-07-13 ENCOUNTER — Encounter (HOSPITAL_COMMUNITY): Payer: Self-pay

## 2015-07-13 DIAGNOSIS — R079 Chest pain, unspecified: Secondary | ICD-10-CM | POA: Insufficient documentation

## 2015-07-13 LAB — NM MYOCAR MULTI W/SPECT W/WALL MOTION / EF
CHL CUP NUCLEAR SDS: 5
CHL CUP NUCLEAR SRS: 16
CHL CUP NUCLEAR SSS: 21
LHR: 0.45
LV sys vol: 107 mL
LVDIAVOL: 151 mL (ref 62–150)
Peak HR: 91 {beats}/min
Rest HR: 72 {beats}/min
TID: 1.07

## 2015-07-13 MED ORDER — TECHNETIUM TC 99M SESTAMIBI - CARDIOLITE
10.0000 | Freq: Once | INTRAVENOUS | Status: AC | PRN
  Administered 2015-07-13: 09:00:00 10.8 via INTRAVENOUS

## 2015-07-13 MED ORDER — SODIUM CHLORIDE 0.9% FLUSH
INTRAVENOUS | Status: AC
  Administered 2015-07-13: 10 mL via INTRAVENOUS
  Filled 2015-07-13: qty 10

## 2015-07-13 MED ORDER — TECHNETIUM TC 99M SESTAMIBI GENERIC - CARDIOLITE
30.0000 | Freq: Once | INTRAVENOUS | Status: AC | PRN
  Administered 2015-07-13: 29.8 via INTRAVENOUS

## 2015-07-13 MED ORDER — REGADENOSON 0.4 MG/5ML IV SOLN
INTRAVENOUS | Status: AC
  Administered 2015-07-13: 0.4 mg via INTRAVENOUS
  Filled 2015-07-13: qty 5

## 2015-07-13 NOTE — Telephone Encounter (Signed)
Called patient with test results. No answer. Left message to call back.  

## 2015-07-13 NOTE — Telephone Encounter (Signed)
-----   Message from Imogene Burn, PA-C sent at 07/13/2015 12:44 PM EDT ----- Abnormal nuclear stress test with new LV dysfunction. Please make appt to see Dr. Domenic Polite to talk about cath.

## 2015-07-16 ENCOUNTER — Encounter: Payer: Self-pay | Admitting: Cardiology

## 2015-07-16 ENCOUNTER — Ambulatory Visit (INDEPENDENT_AMBULATORY_CARE_PROVIDER_SITE_OTHER): Payer: Medicare Other | Admitting: Cardiology

## 2015-07-16 VITALS — BP 106/76 | HR 63 | Ht 66.0 in | Wt 205.0 lb

## 2015-07-16 DIAGNOSIS — I25119 Atherosclerotic heart disease of native coronary artery with unspecified angina pectoris: Secondary | ICD-10-CM | POA: Diagnosis not present

## 2015-07-16 DIAGNOSIS — E785 Hyperlipidemia, unspecified: Secondary | ICD-10-CM

## 2015-07-16 DIAGNOSIS — I10 Essential (primary) hypertension: Secondary | ICD-10-CM

## 2015-07-16 DIAGNOSIS — I4892 Unspecified atrial flutter: Secondary | ICD-10-CM | POA: Diagnosis not present

## 2015-07-16 DIAGNOSIS — I429 Cardiomyopathy, unspecified: Secondary | ICD-10-CM

## 2015-07-16 DIAGNOSIS — I708 Atherosclerosis of other arteries: Secondary | ICD-10-CM

## 2015-07-16 DIAGNOSIS — I6523 Occlusion and stenosis of bilateral carotid arteries: Secondary | ICD-10-CM | POA: Diagnosis not present

## 2015-07-16 DIAGNOSIS — I771 Stricture of artery: Secondary | ICD-10-CM

## 2015-07-16 MED ORDER — NITROGLYCERIN 0.4 MG SL SUBL: 0.4000 mg | SUBLINGUAL_TABLET | SUBLINGUAL | Status: DC | PRN

## 2015-07-16 NOTE — Patient Instructions (Signed)
Your physician recommends that you schedule a follow-up appointment in: 2 weeks with Dr Domenic Polite  Your physician has requested that you have an echocardiogram. Echocardiography is a painless test that uses sound waves to create images of your heart. It provides your doctor with information about the size and shape of your heart and how well your heart's chambers and valves are working. This procedure takes approximately one hour. There are no restrictions for this procedure.  Take omeprazole EVERY DAY  Take nitroglycerine as directed  Nitroglycerin sublingual tablets What is this medicine? NITROGLYCERIN (nye troe GLI ser in) is a type of vasodilator. It relaxes blood vessels, increasing the blood and oxygen supply to your heart. This medicine is used to relieve chest pain caused by angina. It is also used to prevent chest pain before activities like climbing stairs, going outdoors in cold weather, or sexual activity. This medicine may be used for other purposes; ask your health care provider or pharmacist if you have questions. What should I tell my health care provider before I take this medicine? They need to know if you have any of these conditions: -anemia -head injury, recent stroke, or bleeding in the brain -liver disease -previous heart attack -an unusual or allergic reaction to nitroglycerin, other medicines, foods, dyes, or preservatives -pregnant or trying to get pregnant -breast-feeding How should I use this medicine? Take this medicine by mouth as needed. At the first sign of an angina attack (chest pain or tightness) place one tablet under your tongue. You can also take this medicine 5 to 10 minutes before an event likely to produce chest pain. Follow the directions on the prescription label. Let the tablet dissolve under the tongue. Do not swallow whole. Replace the dose if you accidentally swallow it. It will help if your mouth is not dry. Saliva around the tablet will help it to  dissolve more quickly. Do not eat or drink, smoke or chew tobacco while a tablet is dissolving. If you are not better within 5 minutes after taking ONE dose of nitroglycerin, call 9-1-1 immediately to seek emergency medical care. Do not take more than 3 nitroglycerin tablets over 15 minutes. If you take this medicine often to relieve symptoms of angina, your doctor or health care professional may provide you with different instructions to manage your symptoms. If symptoms do not go away after following these instructions, it is important to call 9-1-1 immediately. Do not take more than 3 nitroglycerin tablets over 15 minutes. Talk to your pediatrician regarding the use of this medicine in children. Special care may be needed. Overdosage: If you think you have taken too much of this medicine contact a poison control center or emergency room at once. NOTE: This medicine is only for you. Do not share this medicine with others. What if I miss a dose? This does not apply. This medicine is only used as needed. What may interact with this medicine? Do not take this medicine with any of the following medications: -certain migraine medicines like ergotamine and dihydroergotamine (DHE) -medicines used to treat erectile dysfunction like sildenafil, tadalafil, and vardenafil -riociguat This medicine may also interact with the following medications: -alteplase -aspirin -heparin -medicines for high blood pressure -medicines for mental depression -other medicines used to treat angina -phenothiazines like chlorpromazine, mesoridazine, prochlorperazine, thioridazine This list may not describe all possible interactions. Give your health care provider a list of all the medicines, herbs, non-prescription drugs, or dietary supplements you use. Also tell them if you smoke,  drink alcohol, or use illegal drugs. Some items may interact with your medicine. What should I watch for while using this medicine? Tell your  doctor or health care professional if you feel your medicine is no longer working. Keep this medicine with you at all times. Sit or lie down when you take your medicine to prevent falling if you feel dizzy or faint after using it. Try to remain calm. This will help you to feel better faster. If you feel dizzy, take several deep breaths and lie down with your feet propped up, or bend forward with your head resting between your knees. You may get drowsy or dizzy. Do not drive, use machinery, or do anything that needs mental alertness until you know how this drug affects you. Do not stand or sit up quickly, especially if you are an older patient. This reduces the risk of dizzy or fainting spells. Alcohol can make you more drowsy and dizzy. Avoid alcoholic drinks. Do not treat yourself for coughs, colds, or pain while you are taking this medicine without asking your doctor or health care professional for advice. Some ingredients may increase your blood pressure. What side effects may I notice from receiving this medicine? Side effects that you should report to your doctor or health care professional as soon as possible: -blurred vision -dry mouth -skin rash -sweating -the feeling of extreme pressure in the head -unusually weak or tired Side effects that usually do not require medical attention (report to your doctor or health care professional if they continue or are bothersome): -flushing of the face or neck -headache -irregular heartbeat, palpitations -nausea, vomiting This list may not describe all possible side effects. Call your doctor for medical advice about side effects. You may report side effects to FDA at 1-800-FDA-1088. Where should I keep my medicine? Keep out of the reach of children. Store at room temperature between 20 and 25 degrees C (68 and 77 degrees F). Store in [facility] [employer]. Protect from light and moisture. Keep tightly closed. Throw away any unused medicine after the  expiration date. NOTE: This sheet is a summary. It may not cover all possible information. If you have questions about this medicine, talk to your doctor, pharmacist, or health care provider.    2016, [employer]vier/Gold Standard. (2013-01-30 17:57:36)    Thank you for choosing [facility] !

## 2015-07-16 NOTE — Progress Notes (Signed)
Cardiology Office Note  Date: 07/16/2015   ID: Victor Diaz, DOB 06-29-1945, MRN [id number]  PCP: Victor Peng, NP  Primary Cardiologist: Victor Lesches, MD   Chief Complaint  Patient presents with  . Follow-up testing    History of Present Illness: Victor Diaz is a 70 y.o. male seen recently in the office by Ms. Victor Public PA-C. I saw him in hospital consultation back in July 2016 with atrial flutter and history of syncope with ongoing alcohol abuse. He was referred recently for ischemic testing by Ms. Victor Public PA-C secondary to chest pain symptoms. Cardiolite study is outlined below. I reviewed the report and also the images. He had an inferolateral defect from apex to base that was partially reversible and most consistent with scar and moderate peri-infarct ischemia. LVEF was calculated at 29%. By echocardiography his LVEF had been 55-60% as of June 2016. The basal inferolateral wall was akinetic at that time also consistent with scar.  He comes in today for a follow-up visit. We discussed the results of the stress test. He describes having mainly nocturnal chest discomfort in the early morning hours, some radiation into his right arm. He takes Tums at times with improvement. Symptoms can last for up to an hour at a time. Interestingly, during the daytime when he is active he does not have any chest pain. He takes omeprazole only as needed.  He continues to drink alcohol heavily, but states that he is cutting back. I discussed the critical importance of reducing his alcohol intake, and optimally cessation.  He does not report any recent palpitations. He has previously documented PSVT and atrial flutter as documented below. CHADSVASC score is 3, he is not anticoagulated in light of active alcohol abuse and history of syncope, concerns about increased bleeding risk. He does take an aspirin a day as well as a beta blocker.  Past Medical History  Diagnosis Date  . Allergy   . Gout   .  Hyperlipidemia   . Alcohol abuse   . Hemothorax on right   . Lisfranc's dislocation 10/20/2011  . Right fibular fracture 10/20/2011  . Essential hypertension   . PSVT (paroxysmal supraventricular tachycardia) ([facility])   . Paroxysmal atrial flutter ([facility])     Poor anticoagulation candidate with active alcohol abuse and syncope  . Subclavian artery stenosis, left     Status post stent placement by Dr. Trula Diaz 10/2014    Past Surgical History  Procedure Laterality Date  . Appendectomy    . Hernia repair    . Tonsilectomy, adenoidectomy, bilateral myringotomy and tubes    . Revision total hip arthroplasty  08/28/2008  . Partial hip arthroplasty Left 2010  . Peripheral vascular catheterization N/A 11/11/2014    Procedure: Aortic Arch Angiography/Left Subclavion Stent;  Surgeon: Victor Mitchell, MD;  Location: [facility];  Service: Cardiovascular;  Laterality: N/A;    Current Outpatient Prescriptions  Medication Sig Dispense Refill  . aspirin 325 MG tablet Take 325 mg by mouth daily.    . cetirizine (ZYRTEC) 10 MG tablet TAKE ONE TABLET BY MOUTH ONCE DAILY AS NEEDED. 60 tablet 0  . metoprolol succinate (TOPROL-XL) 25 MG 24 hr tablet **MUST SCHEDULE APPT FOR FURTHER REFILLS.** 180 tablet 0  . Multiple Vitamin (MULTIVITAMIN WITH MINERALS) TABS tablet Take 1 tablet by mouth daily. 30 tablet 0  . omeprazole (PRILOSEC) 20 MG capsule TAKE 1 CAPSULE BY MOUTH DAILY AS NEEDED FOR HEART BURN. 90 capsule 1  . simvastatin (ZOCOR)  40 MG tablet TAKE (1) TABLET BY MOUTH AT BEDTIME. 90 tablet 1  . nitroGLYCERIN (NITROSTAT) 0.4 MG SL tablet Place 1 tablet (0.4 mg total) under the tongue every 5 (five) minutes as needed. 25 tablet 3   No current facility-administered medications for this visit.   Allergies:  Atorvastatin   Social History: The patient  reports that he quit smoking about 45 years ago. His smoking use included Cigarettes. He has a 10 pack-year smoking history. He has never used smokeless  tobacco. He reports that he drinks about 37.8 oz of alcohol per week. He reports that he does not use illicit drugs.   ROS:  Please see the history of present illness. Otherwise, complete review of systems is positive for reflux and arthritic symptoms.  All other systems are reviewed and negative.   Physical Exam: VS:  BP 106/76 mmHg  Pulse 63  Ht 5\' 6"  (1.676 m)  Wt 205 lb (92.987 kg)  BMI 33.10 kg/m2  SpO2 95%, BMI Body mass index is 33.1 kg/(m^2).  Wt Readings from Last 3 Encounters:  07/16/15 205 lb (92.987 kg)  06/23/15 202 lb (91.627 kg)  06/21/15 204 lb (92.534 kg)    General: Obese male, appears comfortable at rest. HEENT: Conjunctiva and lids normal, oropharynx clear. Neck: Supple, no elevated JVP or carotid bruits, no thyromegaly. Lungs: Clear to auscultation, nonlabored breathing at rest. Cardiac: Regular rate and rhythm, no S3, soft systolic murmur, no pericardial rub. Abdomen: Protuberant, nontender, bowel sounds present, no guarding or rebound. Extremities: Mild lower leg edema, distal pulses 2+. Skin: Warm and dry. Musculoskeletal: No kyphosis. Neuropsychiatric: Alert and oriented x3, affect grossly appropriate.  ECG: I personally reviewed the prior tracing from 06/23/2015 which showed sinus rhythm with evidence of old inferior infarct.  Recent Labwork: 10/14/2014: TSH 3.418 10/17/2014: ALT 46; AST 53* 01/30/2015: Hemoglobin 13.2; Platelets 125* 01/31/2015: BUN 10; Creatinine, Ser 1.13; Magnesium 1.9; Potassium 4.2; Sodium 136     Component Value Date/Time   CHOL 160 11/19/2013 1056   TRIG 178.0* 11/19/2013 1056   TRIG 67 04/05/2006 1012   HDL 36.50* 11/19/2013 1056   CHOLHDL 4 11/19/2013 1056   CHOLHDL 3.2 CALC 04/05/2006 1012   VLDL 35.6 11/19/2013 1056   LDLCALC 88 11/19/2013 1056   LDLDIRECT 85.3 12/06/2012 1457    Other Studies Reviewed Today:  Echocardiogram 10/15/2014: Study Conclusions  - Left ventricle: The cavity size was normal. Wall thickness  was  normal. Systolic function was normal. The estimated ejection  fraction was in the range of 55% to 60%. There is akinesis of the  basalinferolateral myocardium. Features are consistent with a  pseudonormal left ventricular filling pattern, with concomitant  abnormal relaxation and increased filling pressure (grade 2  diastolic dysfunction). - Aortic valve: There was mild stenosis. There was trivial  regurgitation. Mean gradient (S): 7 mm Hg. Peak gradient (S): 15  mm Hg. Valve area by planimetry 1.7 cm2. - Mitral valve: Calcified annulus. There was trivial regurgitation. - Left atrium: The atrium was at the upper limits of normal in  size. - Right atrium: Central venous pressure (est): 3 mm Hg. - Tricuspid valve: There was trivial regurgitation. - Pulmonary arteries: PA peak pressure: 27 mm Hg (S). - Pericardium, extracardiac: There was no pericardial effusion.  Impressions:  - Normal LV wall thickness with LVEF 0000000, grade 2 diastolic  dysfunction. Basal inferolateral akinesis noted. Upper normal  left atrial size. Mild aortic stenosis as outlined. Mild aortic  regurgitation. Trivial tricuspid regurgitation with  PASP 27 mmHg.  Lexiscan Cardiolite 07/13/2015:  There was no ST segment deviation noted during stress.  T wave inversion was noted during stress in the III and aVF leads.  Defect 1: There is a medium defect of severe severity present in the basal inferior, basal inferolateral, mid inferior and mid inferolateral location.  This is an intermediate risk study.  Findings consistent with prior myocardial infarction with peri-infarct ischemia.  Nuclear stress EF: 29%.  Assessment and Plan:  1. Recurrent chest pain symptoms, mainly in the early morning hours, also associated with reflux. He is not reporting exertional chest discomfort during the daytime hours. Still concerning for angina nonetheless, particularly in light of his abnormal Cardiolite study.  We discussed these concerns today. He states that over the last week he has been feeling better. We did talk about pursuing a cardiac catheterization as a potential next step, he wanted to consider this further. For now we will obtain an echocardiogram to clarify LVEF, I asked him to take omeprazole on a daily basis to see if his symptoms are more related to reflux than actual angina. Also prescription given for nitroglycerin. I will see him back within the next few weeks.  2. History of PSVT and paroxysmal atrial flutter. No palpitations recently. Heart rate is regular today. His CHADSVASC score is 3, but he is not an optimal anticoagulation candidate with active alcohol abuse and history of syncope with increased bleeding risk. He will continue on aspirin and beta blocker for now.  3. Alcohol abuse. I discussed the critical importance of cutting back his alcohol intake, and optimally cessation.  4. Essential hypertension, blood pressure is normal today.  5. History of left subclavian stenosis status post stent placement by Dr. Trula Diaz.  6. Hyperlipidemia on Zocor, LDL 88.  Current medicines were reviewed with the patient today.   Orders Placed This Encounter  Procedures  . Echocardiogram    Disposition: FU with me in 2 weeks.   Signed, Satira Sark, MD, [facility] 07/16/2015 9:21 AM    [facility] [place]. [address], [place], [place] [address] Phone: [phone]; Fax: [phone]

## 2015-07-26 ENCOUNTER — Ambulatory Visit (HOSPITAL_COMMUNITY): Payer: Medicare Other

## 2015-08-09 ENCOUNTER — Ambulatory Visit (HOSPITAL_COMMUNITY): Payer: Medicare Other

## 2015-08-12 ENCOUNTER — Ambulatory Visit (HOSPITAL_COMMUNITY)
Admission: RE | Admit: 2015-08-12 | Discharge: 2015-08-12 | Disposition: A | Discharge status: Home / Self Care | Payer: Medicare Other | Source: Ambulatory Visit | Attending: Cardiology | Admitting: Cardiology

## 2015-08-12 DIAGNOSIS — I350 Nonrheumatic aortic (valve) stenosis: Secondary | ICD-10-CM | POA: Diagnosis not present

## 2015-08-12 DIAGNOSIS — E785 Hyperlipidemia, unspecified: Secondary | ICD-10-CM | POA: Insufficient documentation

## 2015-08-12 DIAGNOSIS — I340 Nonrheumatic mitral (valve) insufficiency: Secondary | ICD-10-CM | POA: Diagnosis not present

## 2015-08-12 DIAGNOSIS — I25119 Atherosclerotic heart disease of native coronary artery with unspecified angina pectoris: Secondary | ICD-10-CM | POA: Insufficient documentation

## 2015-08-12 DIAGNOSIS — I517 Cardiomegaly: Secondary | ICD-10-CM | POA: Insufficient documentation

## 2015-08-13 ENCOUNTER — Ambulatory Visit (INDEPENDENT_AMBULATORY_CARE_PROVIDER_SITE_OTHER): Payer: Medicare Other | Admitting: Cardiology

## 2015-08-13 ENCOUNTER — Encounter: Payer: Self-pay | Admitting: Cardiology

## 2015-08-13 ENCOUNTER — Other Ambulatory Visit: Payer: Self-pay | Admitting: Cardiology

## 2015-08-13 VITALS — BP 126/78 | HR 89 | Ht 66.0 in | Wt 205.0 lb

## 2015-08-13 DIAGNOSIS — I4892 Unspecified atrial flutter: Secondary | ICD-10-CM

## 2015-08-13 DIAGNOSIS — I6523 Occlusion and stenosis of bilateral carotid arteries: Secondary | ICD-10-CM

## 2015-08-13 DIAGNOSIS — I25119 Atherosclerotic heart disease of native coronary artery with unspecified angina pectoris: Secondary | ICD-10-CM

## 2015-08-13 DIAGNOSIS — R9430 Abnormal result of cardiovascular function study, unspecified: Secondary | ICD-10-CM | POA: Diagnosis not present

## 2015-08-13 DIAGNOSIS — R9439 Abnormal result of other cardiovascular function study: Secondary | ICD-10-CM

## 2015-08-13 DIAGNOSIS — E785 Hyperlipidemia, unspecified: Secondary | ICD-10-CM | POA: Diagnosis not present

## 2015-08-13 DIAGNOSIS — I708 Atherosclerosis of other arteries: Secondary | ICD-10-CM

## 2015-08-13 DIAGNOSIS — I771 Stricture of artery: Secondary | ICD-10-CM

## 2015-08-13 NOTE — Progress Notes (Signed)
Cardiology Office Note  Date: 08/13/2015   ID: GAL LEFTON, DOB 1945/09/27, MRN [id number]  PCP: Dorothyann Peng, NP  Primary Cardiologist: Rozann Lesches, MD   Chief Complaint  Patient presents with  . Cardiac follow-up    History of Present Illness: Victor Diaz is a 70 y.o. male last seen in March. At that time we discussed his follow-up Cardiolite study and also planned a follow-up echocardiogram to reassess LVEF. He presents today to review symptoms. Still reports possible angina, has used nitroglycerin with relief. Also experiencing orthostatic dizziness, no falls. He has had no palpitations.  Repeat echocardiogram done just recently shows normal LVEF at 60-65%, hypokinesis of the inferior/inferolateral wall consistent with scar, grade 2 diastolic dysfunction. I reviewed this with him.  Today we discussed cardiac catheterization to clearly evaluate coronary anatomy particular in light of his abnormal Cardiolite findings and angina symptoms. After discussing the risks and benefits, he is in agreement to proceed.  He has had no significant recurrent atrial flutter or PSVT. As noted previously, we are holding off on anticoagulation with his history of alcohol abuse and syncope.  Past Medical History  Diagnosis Date  . Allergy   . Gout   . Hyperlipidemia   . Alcohol abuse   . Hemothorax on right   . Lisfranc's dislocation 10/20/2011  . Right fibular fracture 10/20/2011  . Essential hypertension   . PSVT (paroxysmal supraventricular tachycardia) ([facility])   . Paroxysmal atrial flutter ([facility])     Poor anticoagulation candidate with active alcohol abuse and syncope  . Subclavian artery stenosis, left     Status post stent placement by Dr. Trula Slade 10/2014    Past Surgical History  Procedure Laterality Date  . Appendectomy    . Hernia repair    . Tonsilectomy, adenoidectomy, bilateral myringotomy and tubes    . Revision total hip arthroplasty  08/28/2008  . Partial hip  arthroplasty Left 2010  . Peripheral vascular catheterization N/A 11/11/2014    Procedure: Aortic Arch Angiography/Left Subclavion Stent;  Surgeon: Serafina Mitchell, MD;  Location: [facility];  Service: Cardiovascular;  Laterality: N/A;    Current Outpatient Prescriptions  Medication Sig Dispense Refill  . aspirin 325 MG tablet Take 325 mg by mouth daily.    . cetirizine (ZYRTEC) 10 MG tablet TAKE ONE TABLET BY MOUTH ONCE DAILY AS NEEDED. 60 tablet 0  . metoprolol succinate (TOPROL-XL) 25 MG 24 hr tablet **MUST SCHEDULE APPT FOR FURTHER REFILLS.** 180 tablet 0  . Multiple Vitamin (MULTIVITAMIN WITH MINERALS) TABS tablet Take 1 tablet by mouth daily. 30 tablet 0  . nitroGLYCERIN (NITROSTAT) 0.4 MG SL tablet Place 1 tablet (0.4 mg total) under the tongue every 5 (five) minutes as needed. 25 tablet 3  . omeprazole (PRILOSEC) 20 MG capsule TAKE 1 CAPSULE BY MOUTH DAILY AS NEEDED FOR HEART BURN. 90 capsule 1  . simvastatin (ZOCOR) 40 MG tablet TAKE (1) TABLET BY MOUTH AT BEDTIME. 90 tablet 1   No current facility-administered medications for this visit.   Allergies:  Atorvastatin   Social History: The patient  reports that he quit smoking about 46 years ago. His smoking use included Cigarettes. He has a 10 pack-year smoking history. He has never used smokeless tobacco. He reports that he drinks about 37.8 oz of alcohol per week. He reports that he does not use illicit drugs.   Family History: The patient's family history includes Arthritis in his mother; Cerebral aneurysm in his mother; Tuberculosis in  his father.   ROS:  Please see the history of present illness. Otherwise, complete review of systems is positive for NYHA class II dyspnea.  All other systems are reviewed and negative.   Physical Exam: VS:  BP 126/78 mmHg  Pulse 89  Ht 5\' 6"  (1.676 m)  Wt 205 lb (92.987 kg)  BMI 33.10 kg/m2  SpO2 96%, BMI Body mass index is 33.1 kg/(m^2).  Wt Readings from Last 3 Encounters:    08/13/15 205 lb (92.987 kg)  07/16/15 205 lb (92.987 kg)  06/23/15 202 lb (91.627 kg)    General: Obese male, appears comfortable at rest. HEENT: Conjunctiva and lids normal, oropharynx clear. Neck: Supple, no elevated JVP or carotid bruits, no thyromegaly. Lungs: Clear to auscultation, nonlabored breathing at rest. Cardiac: Regular rate and rhythm, no S3, soft systolic murmur, no pericardial rub. Abdomen: Protuberant, nontender, bowel sounds present, no guarding or rebound. Extremities: Mild lower leg edema, distal pulses 2+. Skin: Warm and dry. Musculoskeletal: No kyphosis. Neuropsychiatric: Alert and oriented x3, affect grossly appropriate.  ECG: I personally reviewed the prior tracing from 06/23/2015 which showed sinus rhythm with LVH and old inferior infarct pattern.  Recent Labwork: 10/14/2014: TSH 3.418 10/17/2014: ALT 46; AST 53* 01/30/2015: Hemoglobin 13.2; Platelets 125* 01/31/2015: BUN 10; Creatinine, Ser 1.13; Magnesium 1.9; Potassium 4.2; Sodium 136     Component Value Date/Time   CHOL 160 11/19/2013 1056   TRIG 178.0* 11/19/2013 1056   TRIG 67 04/05/2006 1012   HDL 36.50* 11/19/2013 1056   CHOLHDL 4 11/19/2013 1056   CHOLHDL 3.2 CALC 04/05/2006 1012   VLDL 35.6 11/19/2013 1056   LDLCALC 88 11/19/2013 1056   LDLDIRECT 85.3 12/06/2012 1457    Other Studies Reviewed Today:  Lexiscan Cardiolite 07/13/2015:  There was no ST segment deviation noted during stress.  T wave inversion was noted during stress in the III and aVF leads.  Defect 1: There is a medium defect of severe severity present in the basal inferior, basal inferolateral, mid inferior and mid inferolateral location.  This is an intermediate risk study.  Findings consistent with prior myocardial infarction with peri-infarct ischemia.  Nuclear stress EF: 29%.  Echocardiogram 08/12/2015: Study Conclusions  - Left ventricle: The cavity size was normal. Wall thickness was  increased in a pattern of  mild LVH. Systolic function was normal.  The estimated ejection fraction was in the range of 60% to 65%.  Features are consistent with a pseudonormal left ventricular  filling pattern, with concomitant abnormal relaxation and  increased filling pressure (grade 2 diastolic dysfunction).  Doppler parameters are consistent with high ventricular filling  pressure. - Regional wall motion abnormality: Mild hypokinesis of the mid  inferior and mid inferolateral myocardium. - Aortic valve: Mildly to moderately calcified annulus. Mildly  thickened, mildly calcified leaflets. There was mild stenosis.  Peak velocity (S): 213 cm/s. Mean gradient (S): 9 mm Hg. - Mitral valve: Calcified annulus. There was mild regurgitation. - Left atrium: The atrium was mildly dilated.  Assessment and Plan:  1. Recurrent angina with Cardiolite study demonstrating inferior/inferolateral scar with moderate peri-infarct ischemia. LVEF is 60-65% by echocardiogram. As noted above, plan is to proceed with a diagnostic cardiac catheterization to more definitively evaluate coronary anatomy for revascularization options. He will continue with current regimen for now.  2. History of atrial flutter and also PSVT. We are holding off on anticoagulation with history of alcohol abuse and syncope. No significant recurrences recently.  3. Hyperlipidemia, continues on Zocor.  4. History  of left subclavian stent placement by Dr. Trula Slade, asymptomatic at this time.  Current medicines were reviewed with the patient today.  Disposition: FU with me after cardiac catheterization.   Signed, Satira Sark, MD, [facility] 08/13/2015 10:46 AM    [facility] [place]. [address] [place],  [address] Phone: [phone]; Fax: [phone]

## 2015-08-13 NOTE — Patient Instructions (Signed)
Your physician has requested that you have a cardiac catheterization. Cardiac catheterization is used to diagnose and/or treat various heart conditions. Doctors may recommend this procedure for a number of different reasons. The most common reason is to evaluate chest pain. Chest pain can be a symptom of coronary artery disease (CAD), and cardiac catheterization can show whether plaque is narrowing or blocking your heart's arteries. This procedure is also used to evaluate the valves, as well as measure the blood flow and oxygen levels in different parts of your heart. For further information please visit [email or web address]. Please follow instruction sheet, as given.  Take 1/2 of 325 mg aspirin daily    Thank you for choosing [facility] !

## 2015-08-19 ENCOUNTER — Other Ambulatory Visit: Payer: Self-pay | Admitting: *Deleted

## 2015-08-19 ENCOUNTER — Inpatient Hospital Stay (HOSPITAL_COMMUNITY): Payer: Medicare Other

## 2015-08-19 ENCOUNTER — Encounter (HOSPITAL_COMMUNITY)
Admission: RE | Disposition: A | Discharge status: Rehabilitation Facility | Payer: Self-pay | Source: Ambulatory Visit | Attending: Cardiothoracic Surgery

## 2015-08-19 ENCOUNTER — Inpatient Hospital Stay (HOSPITAL_COMMUNITY)
Admission: RE | Admit: 2015-08-19 | Discharge: 2015-09-09 | DRG: 233 | Disposition: A | Discharge status: Rehabilitation Facility | Payer: Medicare Other | Source: Ambulatory Visit | Attending: Cardiothoracic Surgery | Admitting: Cardiothoracic Surgery

## 2015-08-19 ENCOUNTER — Encounter (HOSPITAL_COMMUNITY): Payer: Self-pay | Admitting: Interventional Cardiology

## 2015-08-19 DIAGNOSIS — Z888 Allergy status to other drugs, medicaments and biological substances status: Secondary | ICD-10-CM

## 2015-08-19 DIAGNOSIS — Z951 Presence of aortocoronary bypass graft: Secondary | ICD-10-CM

## 2015-08-19 DIAGNOSIS — Z978 Presence of other specified devices: Secondary | ICD-10-CM

## 2015-08-19 DIAGNOSIS — E119 Type 2 diabetes mellitus without complications: Secondary | ICD-10-CM | POA: Diagnosis present

## 2015-08-19 DIAGNOSIS — I4892 Unspecified atrial flutter: Secondary | ICD-10-CM | POA: Diagnosis not present

## 2015-08-19 DIAGNOSIS — Z6836 Body mass index (BMI) 36.0-36.9, adult: Secondary | ICD-10-CM | POA: Diagnosis not present

## 2015-08-19 DIAGNOSIS — D62 Acute posthemorrhagic anemia: Secondary | ICD-10-CM | POA: Diagnosis present

## 2015-08-19 DIAGNOSIS — I10 Essential (primary) hypertension: Secondary | ICD-10-CM | POA: Insufficient documentation

## 2015-08-19 DIAGNOSIS — D696 Thrombocytopenia, unspecified: Secondary | ICD-10-CM | POA: Diagnosis present

## 2015-08-19 DIAGNOSIS — F1010 Alcohol abuse, uncomplicated: Secondary | ICD-10-CM | POA: Diagnosis present

## 2015-08-19 DIAGNOSIS — Z87891 Personal history of nicotine dependence: Secondary | ICD-10-CM | POA: Diagnosis not present

## 2015-08-19 DIAGNOSIS — E785 Hyperlipidemia, unspecified: Secondary | ICD-10-CM | POA: Diagnosis present

## 2015-08-19 DIAGNOSIS — I25110 Atherosclerotic heart disease of native coronary artery with unstable angina pectoris: Secondary | ICD-10-CM | POA: Diagnosis not present

## 2015-08-19 DIAGNOSIS — I480 Paroxysmal atrial fibrillation: Secondary | ICD-10-CM | POA: Diagnosis not present

## 2015-08-19 DIAGNOSIS — K567 Ileus, unspecified: Secondary | ICD-10-CM | POA: Diagnosis not present

## 2015-08-19 DIAGNOSIS — L8990 Pressure ulcer of unspecified site, unspecified stage: Secondary | ICD-10-CM | POA: Insufficient documentation

## 2015-08-19 DIAGNOSIS — L03311 Cellulitis of abdominal wall: Secondary | ICD-10-CM | POA: Diagnosis not present

## 2015-08-19 DIAGNOSIS — E669 Obesity, unspecified: Secondary | ICD-10-CM | POA: Insufficient documentation

## 2015-08-19 DIAGNOSIS — R140 Abdominal distension (gaseous): Secondary | ICD-10-CM

## 2015-08-19 DIAGNOSIS — I2510 Atherosclerotic heart disease of native coronary artery without angina pectoris: Secondary | ICD-10-CM | POA: Diagnosis not present

## 2015-08-19 DIAGNOSIS — R000 Tachycardia, unspecified: Secondary | ICD-10-CM | POA: Insufficient documentation

## 2015-08-19 DIAGNOSIS — Z96642 Presence of left artificial hip joint: Secondary | ICD-10-CM | POA: Diagnosis present

## 2015-08-19 DIAGNOSIS — I110 Hypertensive heart disease with heart failure: Secondary | ICD-10-CM | POA: Diagnosis present

## 2015-08-19 DIAGNOSIS — R29898 Other symptoms and signs involving the musculoskeletal system: Secondary | ICD-10-CM | POA: Diagnosis not present

## 2015-08-19 DIAGNOSIS — K9189 Other postprocedural complications and disorders of digestive system: Secondary | ICD-10-CM | POA: Diagnosis not present

## 2015-08-19 DIAGNOSIS — K7689 Other specified diseases of liver: Secondary | ICD-10-CM | POA: Diagnosis present

## 2015-08-19 DIAGNOSIS — I5023 Acute on chronic systolic (congestive) heart failure: Secondary | ICD-10-CM | POA: Diagnosis present

## 2015-08-19 DIAGNOSIS — R9439 Abnormal result of other cardiovascular function study: Secondary | ICD-10-CM | POA: Insufficient documentation

## 2015-08-19 DIAGNOSIS — I5021 Acute systolic (congestive) heart failure: Secondary | ICD-10-CM | POA: Diagnosis not present

## 2015-08-19 DIAGNOSIS — I200 Unstable angina: Secondary | ICD-10-CM | POA: Diagnosis present

## 2015-08-19 DIAGNOSIS — R0682 Tachypnea, not elsewhere classified: Secondary | ICD-10-CM | POA: Insufficient documentation

## 2015-08-19 DIAGNOSIS — Z01818 Encounter for other preprocedural examination: Secondary | ICD-10-CM

## 2015-08-19 DIAGNOSIS — N179 Acute kidney failure, unspecified: Secondary | ICD-10-CM | POA: Diagnosis not present

## 2015-08-19 DIAGNOSIS — E46 Unspecified protein-calorie malnutrition: Secondary | ICD-10-CM | POA: Diagnosis present

## 2015-08-19 DIAGNOSIS — D689 Coagulation defect, unspecified: Secondary | ICD-10-CM | POA: Diagnosis present

## 2015-08-19 DIAGNOSIS — R931 Abnormal findings on diagnostic imaging of heart and coronary circulation: Secondary | ICD-10-CM | POA: Diagnosis present

## 2015-08-19 DIAGNOSIS — R5381 Other malaise: Secondary | ICD-10-CM | POA: Diagnosis not present

## 2015-08-19 DIAGNOSIS — I471 Supraventricular tachycardia: Secondary | ICD-10-CM | POA: Diagnosis present

## 2015-08-19 DIAGNOSIS — R0602 Shortness of breath: Secondary | ICD-10-CM

## 2015-08-19 DIAGNOSIS — K562 Volvulus: Secondary | ICD-10-CM | POA: Diagnosis not present

## 2015-08-19 DIAGNOSIS — R531 Weakness: Secondary | ICD-10-CM | POA: Diagnosis not present

## 2015-08-19 DIAGNOSIS — E871 Hypo-osmolality and hyponatremia: Secondary | ICD-10-CM | POA: Diagnosis not present

## 2015-08-19 DIAGNOSIS — K560 Paralytic ileus: Secondary | ICD-10-CM

## 2015-08-19 DIAGNOSIS — Z9689 Presence of other specified functional implants: Secondary | ICD-10-CM

## 2015-08-19 HISTORY — PX: CARDIAC CATHETERIZATION: SHX172

## 2015-08-19 LAB — URINALYSIS, ROUTINE W REFLEX MICROSCOPIC
Bilirubin Urine: NEGATIVE
Glucose, UA: NEGATIVE mg/dL
Hgb urine dipstick: NEGATIVE
Ketones, ur: NEGATIVE mg/dL
Leukocytes, UA: NEGATIVE
Nitrite: NEGATIVE
Protein, ur: NEGATIVE mg/dL
Specific Gravity, Urine: 1.025 (ref 1.005–1.030)
pH: 5.5 (ref 5.0–8.0)

## 2015-08-19 LAB — PULMONARY FUNCTION TEST
DL/VA % pred: 75 %
DL/VA: 3.28 ml/min/mmHg/L
DLCO cor % pred: 51 %
DLCO cor: 13.93 ml/min/mmHg
DLCO unc % pred: 50 %
DLCO unc: 13.56 ml/min/mmHg
FEF 25-75 Post: 2.1 L/sec
FEF 25-75 Pre: 1.34 L/sec
FEF2575-%Change-Post: 56 %
FEF2575-%Pred-Post: 103 %
FEF2575-%Pred-Pre: 66 %
FEV1-%Change-Post: 8 %
FEV1-%Pred-Post: 80 %
FEV1-%Pred-Pre: 74 %
FEV1-Post: 2.18 L
FEV1-Pre: 2 L
FEV1FVC-%Change-Post: 6 %
FEV1FVC-%Pred-Pre: 99 %
FEV6-%Change-Post: 2 %
FEV6-%Pred-Post: 80 %
FEV6-%Pred-Pre: 78 %
FEV6-Post: 2.81 L
FEV6-Pre: 2.73 L
FEV6FVC-%Change-Post: 0 %
FEV6FVC-%Pred-Post: 106 %
FEV6FVC-%Pred-Pre: 105 %
FVC-%Change-Post: 1 %
FVC-%Pred-Post: 75 %
FVC-%Pred-Pre: 74 %
FVC-Post: 2.81 L
FVC-Pre: 2.75 L
Post FEV1/FVC ratio: 78 %
Post FEV6/FVC ratio: 100 %
Pre FEV1/FVC ratio: 73 %
Pre FEV6/FVC Ratio: 99 %
RV % pred: 56 %
RV: 1.27 L
TLC % pred: 71 %
TLC: 4.43 L

## 2015-08-19 LAB — BASIC METABOLIC PANEL
Anion gap: 11 (ref 5–15)
BUN: 10 mg/dL (ref 6–20)
CALCIUM: 9 mg/dL (ref 8.9–10.3)
CO2: 18 mmol/L — ABNORMAL LOW (ref 22–32)
CREATININE: 0.95 mg/dL (ref 0.61–1.24)
Chloride: 100 mmol/L — ABNORMAL LOW (ref 101–111)
Glucose, Bld: 104 mg/dL — ABNORMAL HIGH (ref 65–99)
Potassium: 4.2 mmol/L (ref 3.5–5.1)
SODIUM: 129 mmol/L — AB (ref 135–145)

## 2015-08-19 LAB — BLOOD GAS, ARTERIAL
Acid-base deficit: 3.7 mmol/L — ABNORMAL HIGH (ref 0.0–2.0)
Bicarbonate: 20.3 mEq/L (ref 20.0–24.0)
Drawn by: 257081
FIO2: 0.21
O2 Saturation: 94.8 %
Patient temperature: 98.6
TCO2: 21.3 mmol/L (ref 0–100)
pCO2 arterial: 33.8 mmHg — ABNORMAL LOW (ref 35.0–45.0)
pH, Arterial: 7.396 (ref 7.350–7.450)
pO2, Arterial: 78.2 mmHg — ABNORMAL LOW (ref 80.0–100.0)

## 2015-08-19 LAB — CBC
HCT: 39.6 % (ref 39.0–52.0)
Hemoglobin: 13.7 g/dL (ref 13.0–17.0)
MCH: 34.5 pg — ABNORMAL HIGH (ref 26.0–34.0)
MCHC: 34.6 g/dL (ref 30.0–36.0)
MCV: 99.7 fL (ref 78.0–100.0)
PLATELETS: 149 10*3/uL — AB (ref 150–400)
RBC: 3.97 MIL/uL — AB (ref 4.22–5.81)
RDW: 13.4 % (ref 11.5–15.5)
WBC: 5.4 10*3/uL (ref 4.0–10.5)

## 2015-08-19 LAB — PREPARE RBC (CROSSMATCH)

## 2015-08-19 LAB — COMPREHENSIVE METABOLIC PANEL
ALT: 38 U/L (ref 17–63)
AST: 63 U/L — ABNORMAL HIGH (ref 15–41)
Albumin: 3.7 g/dL (ref 3.5–5.0)
Alkaline Phosphatase: 37 U/L — ABNORMAL LOW (ref 38–126)
Anion gap: 12 (ref 5–15)
BUN: 12 mg/dL (ref 6–20)
CO2: 18 mmol/L — ABNORMAL LOW (ref 22–32)
Calcium: 9.1 mg/dL (ref 8.9–10.3)
Chloride: 103 mmol/L (ref 101–111)
Creatinine, Ser: 1.4 mg/dL — ABNORMAL HIGH (ref 0.61–1.24)
GFR calc Af Amer: 57 mL/min — ABNORMAL LOW (ref >60)
GFR calc non Af Amer: 49 mL/min — ABNORMAL LOW (ref >60)
Glucose, Bld: 116 mg/dL — ABNORMAL HIGH (ref 65–99)
Potassium: 4.3 mmol/L (ref 3.5–5.1)
Sodium: 133 mmol/L — ABNORMAL LOW (ref 135–145)
Total Bilirubin: 0.9 mg/dL (ref 0.3–1.2)
Total Protein: 7.2 g/dL (ref 6.5–8.1)

## 2015-08-19 LAB — PROTIME-INR
INR: 1.17 (ref 0.00–1.49)
INR: 1.09 (ref 0.00–1.49)
Prothrombin Time: 15.1 seconds (ref 11.6–15.2)
Prothrombin Time: 14.3 seconds (ref 11.6–15.2)

## 2015-08-19 LAB — SURGICAL PCR SCREEN
MRSA, PCR: NEGATIVE
Staphylococcus aureus: NEGATIVE

## 2015-08-19 SURGERY — LEFT HEART CATH AND CORS/GRAFTS ANGIOGRAPHY

## 2015-08-19 MED ORDER — HEPARIN SODIUM (PORCINE) 1000 UNIT/ML IJ SOLN
INTRAMUSCULAR | Status: AC
  Filled 2015-08-19: qty 1

## 2015-08-19 MED ORDER — PHENYLEPHRINE HCL 10 MG/ML IJ SOLN
30.0000 ug/min | INTRAMUSCULAR | Status: AC
  Administered 2015-08-20: 20 ug/min via INTRAVENOUS
  Filled 2015-08-19: qty 2

## 2015-08-19 MED ORDER — METOPROLOL SUCCINATE ER 25 MG PO TB24
25.0000 mg | ORAL_TABLET | Freq: Once | ORAL | Status: AC
  Administered 2015-08-19: 25 mg via ORAL
  Filled 2015-08-19: qty 1

## 2015-08-19 MED ORDER — NITROGLYCERIN 0.4 MG SL SUBL
0.4000 mg | SUBLINGUAL_TABLET | SUBLINGUAL | Status: DC | PRN
  Administered 2015-08-19: 0.4 mg via SUBLINGUAL
  Filled 2015-08-19: qty 1

## 2015-08-19 MED ORDER — CHLORHEXIDINE GLUCONATE 0.12 % MT SOLN
15.0000 mL | Freq: Once | OROMUCOSAL | Status: AC
  Administered 2015-08-20: 15 mL via OROMUCOSAL

## 2015-08-19 MED ORDER — SPIRITUS FRUMENTI
1.0000 | Freq: Every day | ORAL | Status: DC
  Administered 2015-08-19: 1 via ORAL
  Filled 2015-08-19 (×2): qty 1

## 2015-08-19 MED ORDER — IOPAMIDOL (ISOVUE-370) INJECTION 76%
INTRAVENOUS | Status: AC
  Filled 2015-08-19: qty 100

## 2015-08-19 MED ORDER — DIAZEPAM 5 MG PO TABS
5.0000 mg | ORAL_TABLET | Freq: Once | ORAL | Status: AC
  Administered 2015-08-20: 5 mg via ORAL
  Filled 2015-08-19: qty 1

## 2015-08-19 MED ORDER — TEMAZEPAM 15 MG PO CAPS: 15.0000 mg | ORAL_CAPSULE | Freq: Once | ORAL | Status: DC | PRN

## 2015-08-19 MED ORDER — SODIUM CHLORIDE 0.9% FLUSH: 3.0000 mL | Freq: Two times a day (BID) | INTRAVENOUS | Status: DC

## 2015-08-19 MED ORDER — SODIUM CHLORIDE 0.9 % IV SOLN
INTRAVENOUS | Status: AC
  Administered 2015-08-20: 1 [IU]/h via INTRAVENOUS
  Filled 2015-08-19: qty 2.5

## 2015-08-19 MED ORDER — ASPIRIN 81 MG PO CHEW
81.0000 mg | CHEWABLE_TABLET | Freq: Every day | ORAL | Status: DC
Start: 2015-08-20 — End: 2015-08-20

## 2015-08-19 MED ORDER — HEPARIN SODIUM (PORCINE) 5000 UNIT/ML IJ SOLN
5000.0000 [IU] | Freq: Three times a day (TID) | INTRAMUSCULAR | Status: DC
  Administered 2015-08-19: 5000 [IU] via SUBCUTANEOUS
  Filled 2015-08-19 (×2): qty 1

## 2015-08-19 MED ORDER — HEPARIN (PORCINE) IN NACL 2-0.9 UNIT/ML-% IJ SOLN
INTRAMUSCULAR | Status: DC | PRN
  Administered 2015-08-19: 1000 mL

## 2015-08-19 MED ORDER — CHLORHEXIDINE GLUCONATE 4 % EX LIQD
60.0000 mL | Freq: Once | CUTANEOUS | Status: AC
  Administered 2015-08-20: 4 via TOPICAL

## 2015-08-19 MED ORDER — POTASSIUM CHLORIDE 2 MEQ/ML IV SOLN
80.0000 meq | INTRAVENOUS | Status: DC
  Filled 2015-08-19: qty 40

## 2015-08-19 MED ORDER — LORATADINE 10 MG PO TABS: 10.0000 mg | ORAL_TABLET | Freq: Every day | ORAL | Status: DC

## 2015-08-19 MED ORDER — ASPIRIN 81 MG PO CHEW: 81.0000 mg | CHEWABLE_TABLET | ORAL | Status: DC

## 2015-08-19 MED ORDER — FENTANYL CITRATE (PF) 100 MCG/2ML IJ SOLN
INTRAMUSCULAR | Status: AC
  Filled 2015-08-19: qty 2

## 2015-08-19 MED ORDER — DEXMEDETOMIDINE HCL IN NACL 400 MCG/100ML IV SOLN
0.1000 ug/kg/h | INTRAVENOUS | Status: AC
  Administered 2015-08-20: .4 ug/kg/h via INTRAVENOUS
  Filled 2015-08-19: qty 100

## 2015-08-19 MED ORDER — SODIUM CHLORIDE 0.9% FLUSH
3.0000 mL | Freq: Two times a day (BID) | INTRAVENOUS | Status: DC
  Administered 2015-08-19: 3 mL via INTRAVENOUS

## 2015-08-19 MED ORDER — MIDAZOLAM HCL 2 MG/2ML IJ SOLN
INTRAMUSCULAR | Status: DC | PRN
  Administered 2015-08-19: 1 mg via INTRAVENOUS

## 2015-08-19 MED ORDER — ONDANSETRON HCL 4 MG/2ML IJ SOLN: 4.0000 mg | Freq: Four times a day (QID) | INTRAMUSCULAR | Status: DC | PRN

## 2015-08-19 MED ORDER — BISACODYL 5 MG PO TBEC
5.0000 mg | DELAYED_RELEASE_TABLET | Freq: Once | ORAL | Status: AC
  Administered 2015-08-19: 5 mg via ORAL
  Filled 2015-08-19: qty 1

## 2015-08-19 MED ORDER — CHLORHEXIDINE GLUCONATE 4 % EX LIQD
60.0000 mL | Freq: Once | CUTANEOUS | Status: AC
  Administered 2015-08-19: 4 via TOPICAL
  Filled 2015-08-19: qty 15

## 2015-08-19 MED ORDER — PLASMA-LYTE 148 IV SOLN
INTRAVENOUS | Status: AC
  Administered 2015-08-20: 500 mL
  Filled 2015-08-19: qty 2.5

## 2015-08-19 MED ORDER — ASPIRIN 325 MG PO TABS: 325.0000 mg | ORAL_TABLET | Freq: Every day | ORAL | Status: DC

## 2015-08-19 MED ORDER — METOPROLOL TARTRATE 12.5 MG HALF TABLET
12.5000 mg | ORAL_TABLET | Freq: Once | ORAL | Status: AC
  Administered 2015-08-20: 12.5 mg via ORAL
  Filled 2015-08-19: qty 1

## 2015-08-19 MED ORDER — SODIUM CHLORIDE 0.9 % WEIGHT BASED INFUSION: 1.0000 mL/kg/h | INTRAVENOUS | Status: AC

## 2015-08-19 MED ORDER — DEXTROSE 5 % IV SOLN
1.5000 g | INTRAVENOUS | Status: DC
  Filled 2015-08-19: qty 1.5

## 2015-08-19 MED ORDER — ALBUTEROL SULFATE (2.5 MG/3ML) 0.083% IN NEBU
2.5000 mg | INHALATION_SOLUTION | Freq: Once | RESPIRATORY_TRACT | Status: AC
  Administered 2015-08-19: 2.5 mg via RESPIRATORY_TRACT

## 2015-08-19 MED ORDER — AMINOCAPROIC ACID 250 MG/ML IV SOLN
INTRAVENOUS | Status: AC
  Administered 2015-08-20: 69.8 mL/h via INTRAVENOUS
  Filled 2015-08-19: qty 40

## 2015-08-19 MED ORDER — PANTOPRAZOLE SODIUM 40 MG PO TBEC: 40.0000 mg | DELAYED_RELEASE_TABLET | Freq: Every day | ORAL | Status: DC

## 2015-08-19 MED ORDER — METOPROLOL SUCCINATE ER 25 MG PO TB24: 25.0000 mg | ORAL_TABLET | Freq: Every day | ORAL | Status: DC

## 2015-08-19 MED ORDER — HEPARIN SODIUM (PORCINE) 1000 UNIT/ML IJ SOLN
INTRAMUSCULAR | Status: DC
  Filled 2015-08-19: qty 30

## 2015-08-19 MED ORDER — SODIUM CHLORIDE 0.9 % IV SOLN: 250.0000 mL | INTRAVENOUS | Status: DC | PRN

## 2015-08-19 MED ORDER — HEPARIN SODIUM (PORCINE) 1000 UNIT/ML IJ SOLN
INTRAMUSCULAR | Status: DC | PRN
  Administered 2015-08-19: 5000 [IU] via INTRAVENOUS

## 2015-08-19 MED ORDER — HEPARIN (PORCINE) IN NACL 2-0.9 UNIT/ML-% IJ SOLN
INTRAMUSCULAR | Status: DC | PRN
  Administered 2015-08-19: 10 mL via INTRA_ARTERIAL

## 2015-08-19 MED ORDER — IOPAMIDOL (ISOVUE-370) INJECTION 76%
INTRAVENOUS | Status: AC
  Filled 2015-08-19: qty 50

## 2015-08-19 MED ORDER — NITROGLYCERIN IN D5W 200-5 MCG/ML-% IV SOLN
2.0000 ug/min | INTRAVENOUS | Status: DC
Start: 2015-08-20 — End: 2015-08-20
  Filled 2015-08-19: qty 250

## 2015-08-19 MED ORDER — ACETAMINOPHEN 325 MG PO TABS: 650.0000 mg | ORAL_TABLET | ORAL | Status: DC | PRN

## 2015-08-19 MED ORDER — VERAPAMIL HCL 2.5 MG/ML IV SOLN
INTRAVENOUS | Status: AC
  Filled 2015-08-19: qty 2

## 2015-08-19 MED ORDER — SODIUM CHLORIDE 0.9% FLUSH: 3.0000 mL | INTRAVENOUS | Status: DC | PRN

## 2015-08-19 MED ORDER — FENTANYL CITRATE (PF) 100 MCG/2ML IJ SOLN
INTRAMUSCULAR | Status: DC | PRN
  Administered 2015-08-19: 25 ug via INTRAVENOUS

## 2015-08-19 MED ORDER — IOPAMIDOL (ISOVUE-370) INJECTION 76%
INTRAVENOUS | Status: DC | PRN
  Administered 2015-08-19: 120 mL via INTRA_ARTERIAL

## 2015-08-19 MED ORDER — SIMVASTATIN 40 MG PO TABS
40.0000 mg | ORAL_TABLET | Freq: Every day | ORAL | Status: DC
  Administered 2015-08-19 – 2015-08-23 (×4): 40 mg via ORAL
  Filled 2015-08-19 (×4): qty 1

## 2015-08-19 MED ORDER — METOPROLOL TARTRATE 5 MG/5ML IV SOLN
INTRAVENOUS | Status: AC
  Filled 2015-08-19: qty 5

## 2015-08-19 MED ORDER — MIDAZOLAM HCL 2 MG/2ML IJ SOLN
INTRAMUSCULAR | Status: AC
  Filled 2015-08-19: qty 2

## 2015-08-19 MED ORDER — MAGNESIUM SULFATE 50 % IJ SOLN
40.0000 meq | INTRAMUSCULAR | Status: DC
  Filled 2015-08-19: qty 10

## 2015-08-19 MED ORDER — ALPRAZOLAM 0.25 MG PO TABS: 0.2500 mg | ORAL_TABLET | ORAL | Status: DC | PRN

## 2015-08-19 MED ORDER — LIDOCAINE HCL (PF) 1 % IJ SOLN
INTRAMUSCULAR | Status: AC
  Filled 2015-08-19: qty 30

## 2015-08-19 MED ORDER — SODIUM CHLORIDE 0.9 % IV SOLN
1500.0000 mg | INTRAVENOUS | Status: DC
  Filled 2015-08-19: qty 1500

## 2015-08-19 MED ORDER — HEPARIN (PORCINE) IN NACL 2-0.9 UNIT/ML-% IJ SOLN
INTRAMUSCULAR | Status: AC
Start: 2015-08-19 — End: 2015-08-19
  Filled 2015-08-19: qty 1000

## 2015-08-19 MED ORDER — EPINEPHRINE HCL 1 MG/ML IJ SOLN
0.0000 ug/min | INTRAVENOUS | Status: DC
  Filled 2015-08-19: qty 4

## 2015-08-19 MED ORDER — SODIUM CHLORIDE 0.9 % IV SOLN
INTRAVENOUS | Status: DC
  Administered 2015-08-19: 08:00:00 via INTRAVENOUS

## 2015-08-19 MED ORDER — DEXTROSE 5 % IV SOLN
750.0000 mg | INTRAVENOUS | Status: DC
  Filled 2015-08-19: qty 750

## 2015-08-19 MED ORDER — METOPROLOL TARTRATE 5 MG/5ML IV SOLN
5.0000 mg | INTRAVENOUS | Status: DC | PRN
  Administered 2015-08-19: 5 mg via INTRAVENOUS

## 2015-08-19 MED ORDER — DOPAMINE-DEXTROSE 3.2-5 MG/ML-% IV SOLN
0.0000 ug/kg/min | INTRAVENOUS | Status: DC
  Filled 2015-08-19: qty 250

## 2015-08-19 MED ORDER — LIDOCAINE HCL (PF) 1 % IJ SOLN
INTRAMUSCULAR | Status: DC | PRN
  Administered 2015-08-19: 2 mL

## 2015-08-19 SURGICAL SUPPLY — 15 items
CATH INFINITI 5 FR 3DRC (CATHETERS) ×3 IMPLANT
CATH INFINITI 5 FR JL3.5 (CATHETERS) ×3 IMPLANT
CATH INFINITI JR4 5F (CATHETERS) ×3 IMPLANT
CATH LAUNCHER 5F EBU3.0 (CATHETERS) ×1 IMPLANT
CATH LAUNCHER 5F EBU3.5 (CATHETERS) ×3 IMPLANT
CATHETER LAUNCHER 5F EBU3.0 (CATHETERS) ×3
DEVICE RAD COMP TR BAND LRG (VASCULAR PRODUCTS) ×3 IMPLANT
GLIDESHEATH SLEND SS 6F .021 (SHEATH) ×3 IMPLANT
KIT HEART LEFT (KITS) ×3 IMPLANT
PACK CARDIAC CATHETERIZATION (CUSTOM PROCEDURE TRAY) ×3 IMPLANT
TRANSDUCER W/STOPCOCK (MISCELLANEOUS) ×3 IMPLANT
TUBING CIL FLEX 10 FLL-RA (TUBING) ×3 IMPLANT
VALVE MANIFOLD 3 PORT W/RA/ON (MISCELLANEOUS) ×3 IMPLANT
WIRE HI TORQ VERSACORE-J 145CM (WIRE) ×3 IMPLANT
WIRE SAFE-T 1.5MM-J .035X260CM (WIRE) ×3 IMPLANT

## 2015-08-19 NOTE — Progress Notes (Signed)
Pt complained of chest pressure. 7/10 pain. 1 tab nitrogen given and relieved pain to a 0/10. Vitals stable. EKG performed. MD notified. Will monitor pt closely.

## 2015-08-19 NOTE — Interval H&P Note (Signed)
Cath Lab Visit (complete for each Cath Lab visit)  Clinical Evaluation Leading to the Procedure:   ACS: No.  Non-ACS:    Anginal Classification: CCS II  Anti-ischemic medical therapy: Minimal Therapy (1 class of medications)  Non-Invasive Test Results: High-risk stress test findings: cardiac mortality >3%/year  Prior CABG: No previous CABG      History and Physical Interval Note:  08/19/2015 10:07 AM  Victor Diaz  has presented today for surgery, with the diagnosis of angona, abnormal cardiolite  The various methods of treatment have been discussed with the patient and family. After consideration of risks, benefits and other options for treatment, the patient has consented to  Procedure(s): Left Heart Cath and Coronary Angiography (N/A) as a surgical intervention .  The patient's history has been reviewed, patient examined, no change in status, stable for surgery.  I have reviewed the patient's chart and labs.  Questions were answered to the patient's satisfaction.     Imara Standiford S.

## 2015-08-19 NOTE — H&P (View-Only) (Signed)
Cardiology Office Note  Date: 08/13/2015   ID: Victor Diaz, DOB [birth year], MRN [id number]  PCP: Dorothyann Peng, NP  Primary Cardiologist: Rozann Lesches, MD   Chief Complaint  Patient presents with  . Cardiac follow-up    History of Present Illness: Victor Diaz is a 70 y.o. male last seen in March. At that time we discussed his follow-up Cardiolite study and also planned a follow-up echocardiogram to reassess LVEF. He presents today to review symptoms. Still reports possible angina, has used nitroglycerin with relief. Also experiencing orthostatic dizziness, no falls. He has had no palpitations.  Repeat echocardiogram done just recently shows normal LVEF at 60-65%, hypokinesis of the inferior/inferolateral wall consistent with scar, grade 2 diastolic dysfunction. I reviewed this with him.  Today we discussed cardiac catheterization to clearly evaluate coronary anatomy particular in light of his abnormal Cardiolite findings and angina symptoms. After discussing the risks and benefits, he is in agreement to proceed.  He has had no significant recurrent atrial flutter or PSVT. As noted previously, we are holding off on anticoagulation with his history of alcohol abuse and syncope.  Past Medical History  Diagnosis Date  . Allergy   . Gout   . Hyperlipidemia   . Alcohol abuse   . Hemothorax on right   . Lisfranc's dislocation 10/20/2011  . Right fibular fracture 10/20/2011  . Essential hypertension   . PSVT (paroxysmal supraventricular tachycardia) ([facility])   . Paroxysmal atrial flutter (HCC)     Poor anticoagulation candidate with active alcohol abuse and syncope  . Subclavian artery stenosis, left     Status post stent placement by Dr. Trula Slade 10/2014    Past Surgical History  Procedure Laterality Date  . Appendectomy    . Hernia repair    . Tonsilectomy, adenoidectomy, bilateral myringotomy and tubes    . Revision total hip arthroplasty  08/28/2008  . Partial hip  arthroplasty Left 2010  . Peripheral vascular catheterization N/A 11/11/2014    Procedure: Aortic Arch Angiography/Left Subclavion Stent;  Surgeon: Serafina Mitchell, MD;  Location: [facility];  Service: Cardiovascular;  Laterality: N/A;    Current Outpatient Prescriptions  Medication Sig Dispense Refill  . aspirin 325 MG tablet Take 325 mg by mouth daily.    . cetirizine (ZYRTEC) 10 MG tablet TAKE ONE TABLET BY MOUTH ONCE DAILY AS NEEDED. 60 tablet 0  . metoprolol succinate (TOPROL-XL) 25 MG 24 hr tablet **MUST SCHEDULE APPT FOR FURTHER REFILLS.** 180 tablet 0  . Multiple Vitamin (MULTIVITAMIN WITH MINERALS) TABS tablet Take 1 tablet by mouth daily. 30 tablet 0  . nitroGLYCERIN (NITROSTAT) 0.4 MG SL tablet Place 1 tablet (0.4 mg total) under the tongue every 5 (five) minutes as needed. 25 tablet 3  . omeprazole (PRILOSEC) 20 MG capsule TAKE 1 CAPSULE BY MOUTH DAILY AS NEEDED FOR HEART BURN. 90 capsule 1  . simvastatin (ZOCOR) 40 MG tablet TAKE (1) TABLET BY MOUTH AT BEDTIME. 90 tablet 1   No current facility-administered medications for this visit.   Allergies:  Atorvastatin   Social History: The patient  reports that he quit smoking about 46 years ago. His smoking use included Cigarettes. He has a 10 pack-year smoking history. He has never used smokeless tobacco. He reports that he drinks about 37.8 oz of alcohol per week. He reports that he does not use illicit drugs.   Family History: The patient's family history includes Arthritis in his mother; Cerebral aneurysm in his mother; Tuberculosis in  his father.   ROS:  Please see the history of present illness. Otherwise, complete review of systems is positive for NYHA class II dyspnea.  All other systems are reviewed and negative.   Physical Exam: VS:  BP 126/78 mmHg  Pulse 89  Ht 5\' 6"  (1.676 m)  Wt 205 lb (92.987 kg)  BMI 33.10 kg/m2  SpO2 96%, BMI Body mass index is 33.1 kg/(m^2).  Wt Readings from Last 3 Encounters:    08/13/15 205 lb (92.987 kg)  07/16/15 205 lb (92.987 kg)  06/23/15 202 lb (91.627 kg)    General: Obese male, appears comfortable at rest. HEENT: Conjunctiva and lids normal, oropharynx clear. Neck: Supple, no elevated JVP or carotid bruits, no thyromegaly. Lungs: Clear to auscultation, nonlabored breathing at rest. Cardiac: Regular rate and rhythm, no S3, soft systolic murmur, no pericardial rub. Abdomen: Protuberant, nontender, bowel sounds present, no guarding or rebound. Extremities: Mild lower leg edema, distal pulses 2+. Skin: Warm and dry. Musculoskeletal: No kyphosis. Neuropsychiatric: Alert and oriented x3, affect grossly appropriate.  ECG: I personally reviewed the prior tracing from 06/23/2015 which showed sinus rhythm with LVH and old inferior infarct pattern.  Recent Labwork: 10/14/2014: TSH 3.418 10/17/2014: ALT 46; AST 53* 01/30/2015: Hemoglobin 13.2; Platelets 125* 01/31/2015: BUN 10; Creatinine, Ser 1.13; Magnesium 1.9; Potassium 4.2; Sodium 136     Component Value Date/Time   CHOL 160 11/19/2013 1056   TRIG 178.0* 11/19/2013 1056   TRIG 67 04/05/2006 1012   HDL 36.50* 11/19/2013 1056   CHOLHDL 4 11/19/2013 1056   CHOLHDL 3.2 CALC 04/05/2006 1012   VLDL 35.6 11/19/2013 1056   LDLCALC 88 11/19/2013 1056   LDLDIRECT 85.3 12/06/2012 1457    Other Studies Reviewed Today:  Lexiscan Cardiolite 07/13/2015:  There was no ST segment deviation noted during stress.  T wave inversion was noted during stress in the III and aVF leads.  Defect 1: There is a medium defect of severe severity present in the basal inferior, basal inferolateral, mid inferior and mid inferolateral location.  This is an intermediate risk study.  Findings consistent with prior myocardial infarction with peri-infarct ischemia.  Nuclear stress EF: 29%.  Echocardiogram 08/12/2015: Study Conclusions  - Left ventricle: The cavity size was normal. Wall thickness was  increased in a pattern of  mild LVH. Systolic function was normal.  The estimated ejection fraction was in the range of 60% to 65%.  Features are consistent with a pseudonormal left ventricular  filling pattern, with concomitant abnormal relaxation and  increased filling pressure (grade 2 diastolic dysfunction).  Doppler parameters are consistent with high ventricular filling  pressure. - Regional wall motion abnormality: Mild hypokinesis of the mid  inferior and mid inferolateral myocardium. - Aortic valve: Mildly to moderately calcified annulus. Mildly  thickened, mildly calcified leaflets. There was mild stenosis.  Peak velocity (S): 213 cm/s. Mean gradient (S): 9 mm Hg. - Mitral valve: Calcified annulus. There was mild regurgitation. - Left atrium: The atrium was mildly dilated.  Assessment and Plan:  1. Recurrent angina with Cardiolite study demonstrating inferior/inferolateral scar with moderate peri-infarct ischemia. LVEF is 60-65% by echocardiogram. As noted above, plan is to proceed with a diagnostic cardiac catheterization to more definitively evaluate coronary anatomy for revascularization options. He will continue with current regimen for now.  2. History of atrial flutter and also PSVT. We are holding off on anticoagulation with history of alcohol abuse and syncope. No significant recurrences recently.  3. Hyperlipidemia, continues on Zocor.  4. History  of left subclavian stent placement by Dr. Trula Slade, asymptomatic at this time.  Current medicines were reviewed with the patient today.  Disposition: FU with me after cardiac catheterization.   Signed, Satira Sark, MD, [facility] 08/13/2015 10:46 AM    [facility]. [address], [place], [place] [address] Phone: [phone]; Fax: [phone]

## 2015-08-19 NOTE — Progress Notes (Signed)
Pre-op Cardiac Surgery  Carotid Findings:   Patient had carotid artery duplex completed 06/2015, results in [facility].   Upper Extremity Right Left  Brachial Pressures Triphasic 166-Triphasic  Radial Waveforms Triphasic Triphasic  Ulnar Waveforms Triphasic Triphasic  Palmar Arch (Allen's Test) Unable to evaluate due to recent catheterization. Signal obliterates with radial compression, is unaffected with ulnar compression.    Lower  Extremity Right Left  Dorsalis Pedis Triphasic Triphasic  Anterior Tibial    Posterior Tibial Triphasic Triphasic  Ankle/Brachial Indices     Bilateral pedal artery waveforms are within normal limits at rest.  08/19/2015 4:14 PM Maudry Mayhew, RVT, RDCS, RDMS

## 2015-08-19 NOTE — Anesthesia Preprocedure Evaluation (Addendum)
Anesthesia Evaluation  Patient identified by MRN, date of birth, ID band Patient awake    Reviewed: Allergy & Precautions, H&P , NPO status , Patient's Chart, lab work & pertinent test results, reviewed documented beta blocker date and time   Airway Mallampati: III  TM Distance: >3 FB Neck ROM: Full    Dental no notable dental hx. (+) Teeth Intact, Dental Advisory Given   Pulmonary neg pulmonary ROS, former smoker,    Pulmonary exam normal breath sounds clear to auscultation       Cardiovascular hypertension, Pt. on medications and Pt. on home beta blockers + angina at rest + CAD and + Peripheral Vascular Disease   Rhythm:Regular Rate:Normal     Neuro/Psych negative neurological ROS  negative psych ROS   GI/Hepatic Neg liver ROS, GERD  Medicated,  Endo/Other  negative endocrine ROS  Renal/GU negative Renal ROS  negative genitourinary   Musculoskeletal   Abdominal   Peds  Hematology negative hematology ROS (+)   Anesthesia Other Findings   Reproductive/Obstetrics negative OB ROS                            Anesthesia Physical Anesthesia Plan  ASA: IV  Anesthesia Plan: General   Post-op Pain Management:    Induction: Intravenous  Airway Management Planned: Oral ETT  Additional Equipment: Arterial line, CVP, PA Cath, TEE and Ultrasound Guidance Line Placement  Intra-op Plan:   Post-operative Plan: Post-operative intubation/ventilation  Informed Consent: I have reviewed the patients History and Physical, chart, labs and discussed the procedure including the risks, benefits and alternatives for the proposed anesthesia with the patient or authorized representative who has indicated his/her understanding and acceptance.   Dental advisory given  Plan Discussed with: CRNA  Anesthesia Plan Comments:         Anesthesia Quick Evaluation

## 2015-08-19 NOTE — Consult Note (Signed)
[address]       [place] [address]             [phone]        Victor Diaz [place] Medical Record [id number] Date of Birth: 23-Mar-1946  Referring: Irish Lack Primary Care: Dorothyann Peng, NP  Chief Complaint:   Chest pain, syncope  History of Present Illness:    Patient examined, cardiac catheterization and 2-D echocardiogram personally reviewed, previous carotid duplex and aortic CTA scans personally reviewed and counseled with patient   70 year old Caucasian male ex-smoker with history alcohol abuse and previous admission for detox presents with persistent episodes of syncope, exertional chest discomfort, and decreasing exercise tolerance. Last year the patient underwent a left subclavian stent for left subclavian stenosis and left subclavian steal. The patient's episodes of dizziness and syncope continued to persist however. The patient underwent a stress test which was positive for ischemia and subsequent he was scheduled for outpatient cardiac catheterization today. This was performed via right radial artery. Patient was found have severe three-vessel CAD with chronic occlusion of the circumflex and 90% stenosis of the LAD and RCA systems. LVEDP was normal.  The subclavian stent is  widely patent. Trans-aortic gradient was 12 mmHg. Echocardiogram personally reviewed showing LVH with normal LV systolic function mild MR and mild AS.  The patient has had a history of atrial flutter, intermittent for the past 4 years. This first occurred when he was in detox at [facility]. He has not been placed on anticoagulation because of his risk of fall-syncope. The patient did develop atrial flutter during the cardiac catheterization procedure today which was transient.  Current Activity/ Functional Status: The patient lives in [place] with his sons and grandsons The patient does not drive-lost license because of DWI He is a retired Hotel manager He has  had a left total hip replacement but is ambulatory and has been fairly active until recently when his angina and syncope have become worse   Zubrod Score: At the time of surgery this patient's most appropriate activity status/level should be described as: []     0    Normal activity, no symptoms []     1    Restricted in physical strenuous activity but ambulatory, able to do out light work [x]     2    Ambulatory and capable of self care, unable to do work activities, up and about                 more than 50%  Of the time                            []     3    Only limited self care, in bed greater than 50% of waking hours []     4    Completely disabled, no self care, confined to bed or chair []     5    Moribund  Past Medical History  Diagnosis Date  . Allergy   . Gout   . Hyperlipidemia   . Alcohol abuse   . Hemothorax on right   . Lisfranc's dislocation 10/20/2011  . Right fibular fracture 10/20/2011  . Essential hypertension   . PSVT (paroxysmal supraventricular tachycardia) ([facility])   . Paroxysmal atrial flutter (HCC)     Poor anticoagulation candidate with active alcohol abuse and syncope  . Subclavian artery stenosis, left  Status post stent placement by Dr. Trula Slade 10/2014    Past Surgical History  Procedure Laterality Date  . Appendectomy    . Hernia repair    . Tonsilectomy, adenoidectomy, bilateral myringotomy and tubes    . Revision total hip arthroplasty  08/28/2008  . Partial hip arthroplasty Left 2010  . Peripheral vascular catheterization N/A 11/11/2014    Procedure: Aortic Arch Angiography/Left Subclavion Stent;  Surgeon: Serafina Mitchell, MD;  Location: [facility];  Service: Cardiovascular;  Laterality: N/A;  . Cardiac catheterization N/A 08/19/2015    Procedure: Left Heart Cath and Cors/Grafts Angiography;  Surgeon: Jettie Booze, MD;  Location: [facility];  Service: Cardiovascular;  Laterality: N/A;    History  Smoking status  . Former Smoker --  1.00 packs/day for 10 years  . Types: Cigarettes  . Quit date: 08/09/1969  Smokeless tobacco  . Never Used    History  Alcohol Use  . 37.8 oz/week  . 70 Cans of beer per week    Comment: Daily    Social History   Social History  . Marital Status: Widowed    Spouse Name: N/A  . Number of Children: N/A  . Years of Education: N/A   Occupational History  . Not on file.   Social History Main Topics  . Smoking status: Former Smoker -- 1.00 packs/day for 10 years    Types: Cigarettes    Quit date: 08/09/1969  . Smokeless tobacco: Never Used  . Alcohol Use: 37.8 oz/week    63 Cans of beer per week     Comment: Daily  . Drug Use: No  . Sexual Activity: Not on file   Other Topics Concern  . Not on file   Social History Narrative   Lives in [place],  Has two sons who live with him   He is no longer working - lost his license.    He does not have a valid driver's license because of alcohol-related in fractions      Son drives him ;has MS          Allergies  Allergen Reactions  . Atorvastatin Anaphylaxis    LIPITOR -  facial swelling    Current Facility-Administered Medications  Medication Dose Route Frequency Provider Last Rate Last Dose  . 0.9 %  sodium chloride infusion  250 mL Intravenous PRN Jettie Booze, MD      . 0.9% sodium chloride infusion  1 mL/kg/hr Intravenous Continuous Jettie Booze, MD 93 mL/hr at 08/19/15 1138 1 mL/kg/hr at 08/19/15 1138  . acetaminophen (TYLENOL) tablet 650 mg  650 mg Oral Q4H PRN Jettie Booze, MD      . Derrill Memo ON 08/20/2015] aspirin chewable tablet 81 mg  81 mg Oral Daily Jettie Booze, MD      . heparin injection 5,000 Units  5,000 Units Subcutaneous [place], MD      . loratadine (CLARITIN) tablet 10 mg  10 mg Oral Daily Jettie Booze, MD      . metoprolol (LOPRESSOR) injection 5 mg  5 mg Intravenous Q5 min PRN Jettie Booze, MD   5 mg at 08/19/15 1011  . metoprolol succinate  (TOPROL-XL) 24 hr tablet 25 mg  25 mg Oral Daily Jettie Booze, MD      . nitroGLYCERIN (NITROSTAT) SL tablet 0.4 mg  0.4 mg Sublingual Q5 min PRN Jettie Booze, MD      . ondansetron [facility])  injection 4 mg  4 mg Intravenous Q6H PRN Jettie Booze, MD      . pantoprazole (PROTONIX) EC tablet 40 mg  40 mg Oral Daily Jettie Booze, MD      . simvastatin (ZOCOR) tablet 40 mg  40 mg Oral q1800 Jettie Booze, MD      . sodium chloride flush (NS) 0.9 % injection 3 mL  3 mL Intravenous Q12H Jettie Booze, MD      . sodium chloride flush (NS) 0.9 % injection 3 mL  3 mL Intravenous PRN Jettie Booze, MD      . spiritus frumenti (ethyl alcohol) solution 1 each  1 each Oral Daily Ivin Poot, MD        Prescriptions prior to admission  Medication Sig Dispense Refill Last Dose  . aspirin 325 MG tablet Take 325 mg by mouth daily.    08/19/2015 at 0530  . cetirizine (ZYRTEC) 10 MG tablet TAKE ONE TABLET BY MOUTH ONCE DAILY AS NEEDED. (Patient taking differently: Take 10 mg by mouth daily as needed for allergies. TAKE ONE TABLET BY MOUTH ONCE DAILY AS NEEDED.) 60 tablet 0 08/18/2015 at Unknown time  . metoprolol succinate (TOPROL-XL) 25 MG 24 hr tablet **MUST SCHEDULE APPT FOR FURTHER REFILLS.** (Patient taking differently: Take 25 mg by mouth daily. **MUST SCHEDULE APPT FOR FURTHER REFILLS.**) 180 tablet 0 08/18/2015 at 1600  . nitroGLYCERIN (NITROSTAT) 0.4 MG SL tablet Place 1 tablet (0.4 mg total) under the tongue every 5 (five) minutes as needed. 25 tablet 3 08/19/2015 at 0400  . omeprazole (PRILOSEC) 20 MG capsule TAKE 1 CAPSULE BY MOUTH DAILY AS NEEDED FOR HEART BURN. 90 capsule 1 08/18/2015 at 2000  . simvastatin (ZOCOR) 40 MG tablet TAKE (1) TABLET BY MOUTH AT BEDTIME. 90 tablet 1 08/18/2015 at 2000  . Multiple Vitamin (MULTIVITAMIN WITH MINERALS) TABS tablet Take 1 tablet by mouth daily. 30 tablet 0 Taking    Family History  Problem Relation Age of Onset  .  Arthritis Mother   . Cerebral aneurysm Mother   . Tuberculosis Father      Review of Systems:       Cardiac Review of Systems: Y or N  Chest Pain [ Yes   ]  Resting SOB [ no  ] Exertional SOB  [ yes ]  Orthopnea [ no ]   Pedal Edema [ no  ]    Palpitations [ yes history atrial flutter ] Syncope  [ yes ]   Presyncope [  yes ]  General Review of Systems: [Y] = yes [  ]=no Constitional: recent weight change [ yes increased 10 pounds in last 3 months ]; anorexia [  ]; fatigue [  ]; nausea [  ]; night sweats [  ]; fever [  ]; or chills [  ]                                                               Dental: poor dentition[  no complaints]; Last Dentist visit: Greater than 1 year  Eye : blurred vision [  ]; diplopia [   ]; vision changes [  ];  Amaurosis fugax[  ]; Resp: cough [  ];  wheezing[  ];  hemoptysis[  ];  shortness of breath[  ]; paroxysmal nocturnal dyspnea[  ]; dyspnea on exertion[  ]; or orthopnea[  ];  GI:  gallstones[  ], vomiting[  ];  dysphagia[  ]; melena[  ];  hematochezia [  ]; heartburn[  ];   Hx of  Colonoscopy[  ]; GU: kidney stones [  ]; hematuria[  ];   dysuria [  ];  nocturia[  ];  history of     obstruction [  ]; urinary frequency [  ]             Skin: rash, swelling[  ];, hair loss[  ];  peripheral edema[  ];  or itching[  ]; Musculosketetal: myalgias[ yes leg cramps  ];  joint swelling[  ];  joint erythema[  ];  joint yes gout];  back pain[ yes ];  Heme/Lymph: bruising[  ];  bleeding[ yes mild bleeding tendency ];  anemia[  ];  Neuro: TIA[  ];  headaches[  ];  stroke[  ];  vertigo[  ];  seizures[  ];   paresthesias[  ];  difficulty walking[  ];  Psych:depression[  ]; anxiety[  ];  Endocrine: diabetes[  ];  thyroid dysfunction[  ];  Immunizations: Flu [  ]; Pneumococcal[  ];  Other: Right-hand dominant                          2012 status post right VATS by Dr. Arlyce Dice for rib fracture [from fall], loculated hemothorax  Physical Exam: BP 135/56 mmHg  Pulse 83   Temp(Src) 97.7 F (36.5 C) (Oral)  Resp 5  Ht 5\' 6"  (1.676 m)  Wt 205 lb (92.987 kg)  BMI 33.10 kg/m2  SpO2 99%       Physical Exam  General: Well-nourished middle-aged Caucasian male no acute distress-gave fairly accurate description of his health issues HEENT: Normocephalic pupils equal , dentition adequate Neck: Supple without JVD, adenopathy, or bruit Chest: Clear to auscultation, symmetrical breath sounds, no rhonchi, no tenderness             or deformity Cardiovascular: Regular rate and rhythm, 1/6 murmur of a AS, no gallop, peripheral pulses             palpable in all extremities Abdomen:  Soft, nontender, no palpable mass or organomegaly Extremities: Warm, well-perfused, no clubbing cyanosis edema or tenderness,              no venous stasis changes of the legs Rectal/GU: Deferred Neuro: Grossly non--focal and symmetrical throughout Skin: Clean and dry without rash or ulceration   Diagnostic Studies & Laboratory data:     Recent Radiology Findings:   No results found.   I have independently reviewed the above radiologic studies.  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 The total time spent in the appointment was 65 minutes.    @ME1 @ 08/19/2015 2:50 PM

## 2015-08-20 ENCOUNTER — Inpatient Hospital Stay (HOSPITAL_COMMUNITY): Payer: Medicare Other | Admitting: Certified Registered Nurse Anesthetist

## 2015-08-20 ENCOUNTER — Encounter (HOSPITAL_COMMUNITY)
Admission: RE | Disposition: A | Discharge status: Rehabilitation Facility | Payer: Self-pay | Source: Ambulatory Visit | Attending: Cardiothoracic Surgery

## 2015-08-20 ENCOUNTER — Inpatient Hospital Stay (HOSPITAL_COMMUNITY): Payer: Medicare Other

## 2015-08-20 DIAGNOSIS — Z951 Presence of aortocoronary bypass graft: Secondary | ICD-10-CM

## 2015-08-20 HISTORY — PX: TEE WITHOUT CARDIOVERSION: SHX5443

## 2015-08-20 HISTORY — PX: CORONARY ARTERY BYPASS GRAFT: SHX141

## 2015-08-20 LAB — PREPARE CRYOPRECIPITATE
Unit division: 0
Unit division: 0

## 2015-08-20 LAB — POCT I-STAT, CHEM 8
BUN: 13 mg/dL (ref 6–20)
BUN: 11 mg/dL (ref 6–20)
BUN: 12 mg/dL (ref 6–20)
BUN: 10 mg/dL (ref 6–20)
BUN: 10 mg/dL (ref 6–20)
BUN: 10 mg/dL (ref 6–20)
BUN: 9 mg/dL (ref 6–20)
CALCIUM ION: 1.23 mmol/L (ref 1.13–1.30)
CALCIUM ION: 0.93 mmol/L — AB (ref 1.13–1.30)
CALCIUM ION: 0.95 mmol/L — AB (ref 1.13–1.30)
CALCIUM ION: 0.98 mmol/L — AB (ref 1.13–1.30)
CALCIUM ION: 1.18 mmol/L (ref 1.13–1.30)
CHLORIDE: 102 mmol/L (ref 101–111)
CHLORIDE: 100 mmol/L — AB (ref 101–111)
CHLORIDE: 103 mmol/L (ref 101–111)
CHLORIDE: 99 mmol/L — AB (ref 101–111)
CREATININE: 0.7 mg/dL (ref 0.61–1.24)
CREATININE: 0.8 mg/dL (ref 0.61–1.24)
CREATININE: 0.7 mg/dL (ref 0.61–1.24)
CREATININE: 0.8 mg/dL (ref 0.61–1.24)
Calcium, Ion: 0.98 mmol/L — ABNORMAL LOW (ref 1.13–1.30)
Calcium, Ion: 1.21 mmol/L (ref 1.13–1.30)
Chloride: 100 mmol/L — ABNORMAL LOW (ref 101–111)
Chloride: 98 mmol/L — ABNORMAL LOW (ref 101–111)
Chloride: 101 mmol/L (ref 101–111)
Creatinine, Ser: 0.8 mg/dL (ref 0.61–1.24)
Creatinine, Ser: 0.7 mg/dL (ref 0.61–1.24)
Creatinine, Ser: 0.8 mg/dL (ref 0.61–1.24)
GLUCOSE: 115 mg/dL — AB (ref 65–99)
GLUCOSE: 112 mg/dL — AB (ref 65–99)
GLUCOSE: 141 mg/dL — AB (ref 65–99)
GLUCOSE: 145 mg/dL — AB (ref 65–99)
GLUCOSE: 177 mg/dL — AB (ref 65–99)
Glucose, Bld: 110 mg/dL — ABNORMAL HIGH (ref 65–99)
Glucose, Bld: 120 mg/dL — ABNORMAL HIGH (ref 65–99)
HCT: 35 % — ABNORMAL LOW (ref 39.0–52.0)
HCT: 29 % — ABNORMAL LOW (ref 39.0–52.0)
HCT: 28 % — ABNORMAL LOW (ref 39.0–52.0)
HCT: 33 % — ABNORMAL LOW (ref 39.0–52.0)
HEMATOCRIT: 28 % — AB (ref 39.0–52.0)
HEMATOCRIT: 36 % — AB (ref 39.0–52.0)
HEMATOCRIT: 22 % — AB (ref 39.0–52.0)
HEMOGLOBIN: 7.5 g/dL — AB (ref 13.0–17.0)
HEMOGLOBIN: 9.9 g/dL — AB (ref 13.0–17.0)
HEMOGLOBIN: 11.2 g/dL — AB (ref 13.0–17.0)
Hemoglobin: 11.9 g/dL — ABNORMAL LOW (ref 13.0–17.0)
Hemoglobin: 12.2 g/dL — ABNORMAL LOW (ref 13.0–17.0)
Hemoglobin: 9.5 g/dL — ABNORMAL LOW (ref 13.0–17.0)
Hemoglobin: 9.5 g/dL — ABNORMAL LOW (ref 13.0–17.0)
POTASSIUM: 4.3 mmol/L (ref 3.5–5.1)
POTASSIUM: 3.6 mmol/L (ref 3.5–5.1)
POTASSIUM: 4.7 mmol/L (ref 3.5–5.1)
Potassium: 4.2 mmol/L (ref 3.5–5.1)
Potassium: 4.4 mmol/L (ref 3.5–5.1)
Potassium: 4.5 mmol/L (ref 3.5–5.1)
Potassium: 3.9 mmol/L (ref 3.5–5.1)
SODIUM: 136 mmol/L (ref 135–145)
SODIUM: 135 mmol/L (ref 135–145)
Sodium: 137 mmol/L (ref 135–145)
Sodium: 133 mmol/L — ABNORMAL LOW (ref 135–145)
Sodium: 136 mmol/L (ref 135–145)
Sodium: 135 mmol/L (ref 135–145)
Sodium: 135 mmol/L (ref 135–145)
TCO2: 26 mmol/L (ref 0–100)
TCO2: 25 mmol/L (ref 0–100)
TCO2: 27 mmol/L (ref 0–100)
TCO2: 25 mmol/L (ref 0–100)
TCO2: 27 mmol/L (ref 0–100)
TCO2: 24 mmol/L (ref 0–100)
TCO2: 23 mmol/L (ref 0–100)

## 2015-08-20 LAB — APTT: aPTT: 29 seconds (ref 24–37)

## 2015-08-20 LAB — POCT I-STAT 4, (NA,K, GLUC, HGB,HCT)
Glucose, Bld: 153 mg/dL — ABNORMAL HIGH (ref 65–99)
HEMATOCRIT: 33 % — AB (ref 39.0–52.0)
HEMOGLOBIN: 11.2 g/dL — AB (ref 13.0–17.0)
POTASSIUM: 3.7 mmol/L (ref 3.5–5.1)
SODIUM: 136 mmol/L (ref 135–145)

## 2015-08-20 LAB — GLUCOSE, CAPILLARY
GLUCOSE-CAPILLARY: 158 mg/dL — AB (ref 65–99)
Glucose-Capillary: 143 mg/dL — ABNORMAL HIGH (ref 65–99)
Glucose-Capillary: 153 mg/dL — ABNORMAL HIGH (ref 65–99)
Glucose-Capillary: 152 mg/dL — ABNORMAL HIGH (ref 65–99)

## 2015-08-20 LAB — CBC
HCT: 37 % — ABNORMAL LOW (ref 39.0–52.0)
HCT: 30.4 % — ABNORMAL LOW (ref 39.0–52.0)
HEMOGLOBIN: 10.3 g/dL — AB (ref 13.0–17.0)
Hemoglobin: 12 g/dL — ABNORMAL LOW (ref 13.0–17.0)
MCH: 32.6 pg (ref 26.0–34.0)
MCH: 32.7 pg (ref 26.0–34.0)
MCHC: 32.4 g/dL (ref 30.0–36.0)
MCHC: 33.9 g/dL (ref 30.0–36.0)
MCV: 100.5 fL — ABNORMAL HIGH (ref 78.0–100.0)
MCV: 96.5 fL (ref 78.0–100.0)
Platelets: 138 10*3/uL — ABNORMAL LOW (ref 150–400)
Platelets: 85 10*3/uL — ABNORMAL LOW (ref 150–400)
RBC: 3.68 MIL/uL — ABNORMAL LOW (ref 4.22–5.81)
RBC: 3.15 MIL/uL — AB (ref 4.22–5.81)
RDW: 13.9 % (ref 11.5–15.5)
RDW: 14.6 % (ref 11.5–15.5)
WBC: 5.6 10*3/uL (ref 4.0–10.5)
WBC: 11.6 10*3/uL — ABNORMAL HIGH (ref 4.0–10.5)

## 2015-08-20 LAB — POCT I-STAT 3, ART BLOOD GAS (G3+)
ACID-BASE DEFICIT: 1 mmol/L (ref 0.0–2.0)
Acid-base deficit: 1 mmol/L (ref 0.0–2.0)
Acid-base deficit: 5 mmol/L — ABNORMAL HIGH (ref 0.0–2.0)
BICARBONATE: 23.2 meq/L (ref 20.0–24.0)
BICARBONATE: 21.3 meq/L (ref 20.0–24.0)
Bicarbonate: 23.7 mEq/L (ref 20.0–24.0)
O2 SAT: 100 %
O2 SAT: 100 %
O2 SAT: 88 %
PCO2 ART: 41.4 mmHg (ref 35.0–45.0)
PH ART: 7.384 (ref 7.350–7.450)
PO2 ART: 386 mmHg — AB (ref 80.0–100.0)
Patient temperature: 36.6
TCO2: 24 mmol/L (ref 0–100)
TCO2: 25 mmol/L (ref 0–100)
TCO2: 23 mmol/L (ref 0–100)
pCO2 arterial: 34.4 mmHg — ABNORMAL LOW (ref 35.0–45.0)
pCO2 arterial: 39.8 mmHg (ref 35.0–45.0)
pH, Arterial: 7.437 (ref 7.350–7.450)
pH, Arterial: 7.318 — ABNORMAL LOW (ref 7.350–7.450)
pO2, Arterial: 217 mmHg — ABNORMAL HIGH (ref 80.0–100.0)
pO2, Arterial: 58 mmHg — ABNORMAL LOW (ref 80.0–100.0)

## 2015-08-20 LAB — BASIC METABOLIC PANEL
Anion gap: 10 (ref 5–15)
BUN: 14 mg/dL (ref 6–20)
CO2: 23 mmol/L (ref 22–32)
Calcium: 8.9 mg/dL (ref 8.9–10.3)
Chloride: 102 mmol/L (ref 101–111)
Creatinine, Ser: 1.32 mg/dL — ABNORMAL HIGH (ref 0.61–1.24)
GFR calc Af Amer: >60 mL/min (ref >60)
GFR calc non Af Amer: 53 mL/min — ABNORMAL LOW (ref >60)
Glucose, Bld: 115 mg/dL — ABNORMAL HIGH (ref 65–99)
Potassium: 4.2 mmol/L (ref 3.5–5.1)
Sodium: 135 mmol/L (ref 135–145)

## 2015-08-20 LAB — PREPARE PLATELET PHERESIS: Unit division: 0

## 2015-08-20 LAB — HEMOGLOBIN A1C
Hgb A1c MFr Bld: 5.6 % (ref 4.8–5.6)
Mean Plasma Glucose: 114 mg/dL

## 2015-08-20 LAB — HEMOGLOBIN AND HEMATOCRIT, BLOOD
HCT: 28 % — ABNORMAL LOW (ref 39.0–52.0)
Hemoglobin: 9.6 g/dL — ABNORMAL LOW (ref 13.0–17.0)

## 2015-08-20 LAB — PLATELET COUNT: Platelets: 87 10*3/uL — ABNORMAL LOW (ref 150–400)

## 2015-08-20 LAB — PREPARE RBC (CROSSMATCH)

## 2015-08-20 LAB — PROTIME-INR
INR: 1.67 — ABNORMAL HIGH (ref 0.00–1.49)
PROTHROMBIN TIME: 19.7 s — AB (ref 11.6–15.2)

## 2015-08-20 SURGERY — CORONARY ARTERY BYPASS GRAFTING (CABG)
Anesthesia: General | Site: Chest

## 2015-08-20 MED ORDER — ROCURONIUM BROMIDE 100 MG/10ML IV SOLN
INTRAVENOUS | Status: DC | PRN
  Administered 2015-08-20: 50 mg via INTRAVENOUS

## 2015-08-20 MED ORDER — SODIUM CHLORIDE 0.9 % IV SOLN
20.0000 ug | INTRAVENOUS | Status: AC
  Administered 2015-08-20: 20 ug via INTRAVENOUS
  Filled 2015-08-20: qty 5

## 2015-08-20 MED ORDER — EPHEDRINE SULFATE 50 MG/ML IJ SOLN
INTRAMUSCULAR | Status: DC | PRN
  Administered 2015-08-20: 5 mg via INTRAVENOUS

## 2015-08-20 MED ORDER — SODIUM CHLORIDE 0.9 % IV SOLN
INTRAVENOUS | Status: DC
  Administered 2015-08-20: 19:00:00 via INTRAVENOUS

## 2015-08-20 MED ORDER — MORPHINE SULFATE (PF) 2 MG/ML IV SOLN
2.0000 mg | INTRAVENOUS | Status: DC | PRN
  Administered 2015-08-21 – 2015-08-22 (×2): 2 mg via INTRAVENOUS
  Filled 2015-08-20 (×2): qty 1

## 2015-08-20 MED ORDER — FENTANYL CITRATE (PF) 250 MCG/5ML IJ SOLN
INTRAMUSCULAR | Status: AC
  Filled 2015-08-20: qty 20

## 2015-08-20 MED ORDER — BISACODYL 5 MG PO TBEC
10.0000 mg | DELAYED_RELEASE_TABLET | Freq: Every day | ORAL | Status: DC
  Administered 2015-08-21 – 2015-09-06 (×6): 10 mg via ORAL
  Filled 2015-08-20 (×10): qty 2

## 2015-08-20 MED ORDER — DEXTROSE 5 % IV SOLN
1.5000 g | INTRAVENOUS | Status: DC | PRN
  Administered 2015-08-20: .75 g via INTRAVENOUS
  Administered 2015-08-20: 1.5 g via INTRAVENOUS

## 2015-08-20 MED ORDER — CHLORHEXIDINE GLUCONATE 0.12% ORAL RINSE (MEDLINE KIT)
15.0000 mL | Freq: Two times a day (BID) | OROMUCOSAL | Status: DC
  Administered 2015-08-20: 15 mL via OROMUCOSAL

## 2015-08-20 MED ORDER — FENTANYL CITRATE (PF) 250 MCG/5ML IJ SOLN
INTRAMUSCULAR | Status: AC
  Filled 2015-08-20: qty 5

## 2015-08-20 MED ORDER — SPIRITUS FRUMENTI
1.0000 | Freq: Two times a day (BID) | ORAL | Status: DC
  Administered 2015-08-21 – 2015-08-24 (×7): 1 via ORAL
  Filled 2015-08-20 (×10): qty 1

## 2015-08-20 MED ORDER — VANCOMYCIN HCL 1000 MG IV SOLR
1000.0000 mg | INTRAVENOUS | Status: DC | PRN
  Administered 2015-08-20: 1000 mg via INTRAVENOUS

## 2015-08-20 MED ORDER — PHENYLEPHRINE 40 MCG/ML (10ML) SYRINGE FOR IV PUSH (FOR BLOOD PRESSURE SUPPORT)
PREFILLED_SYRINGE | INTRAVENOUS | Status: AC
  Filled 2015-08-20: qty 20

## 2015-08-20 MED ORDER — METOPROLOL TARTRATE 12.5 MG HALF TABLET
12.5000 mg | ORAL_TABLET | Freq: Two times a day (BID) | ORAL | Status: DC
  Administered 2015-08-21 – 2015-08-25 (×7): 12.5 mg via ORAL
  Filled 2015-08-20 (×8): qty 1

## 2015-08-20 MED ORDER — LACTATED RINGERS IV SOLN
INTRAVENOUS | Status: DC
  Administered 2015-08-20: 09:00:00 via INTRAVENOUS

## 2015-08-20 MED ORDER — DEXTROSE 5 % IV SOLN
1.5000 g | Freq: Two times a day (BID) | INTRAVENOUS | Status: AC
  Administered 2015-08-21 – 2015-08-22 (×4): 1.5 g via INTRAVENOUS
  Filled 2015-08-20 (×4): qty 1.5

## 2015-08-20 MED ORDER — ASPIRIN EC 325 MG PO TBEC
325.0000 mg | DELAYED_RELEASE_TABLET | Freq: Every day | ORAL | Status: DC
  Administered 2015-08-21: 325 mg via ORAL
  Filled 2015-08-20: qty 1

## 2015-08-20 MED ORDER — ACETAMINOPHEN 160 MG/5ML PO SOLN
1000.0000 mg | Freq: Four times a day (QID) | ORAL | Status: AC
  Administered 2015-08-22 – 2015-08-25 (×3): 1000 mg
  Filled 2015-08-20 (×3): qty 40.6

## 2015-08-20 MED ORDER — HEMOSTATIC AGENTS (NO CHARGE) OPTIME
TOPICAL | Status: DC | PRN
  Administered 2015-08-20 (×2): 1 via TOPICAL

## 2015-08-20 MED ORDER — DOCUSATE SODIUM 100 MG PO CAPS
200.0000 mg | ORAL_CAPSULE | Freq: Every day | ORAL | Status: DC
  Administered 2015-08-21 – 2015-08-24 (×4): 200 mg via ORAL
  Filled 2015-08-20 (×7): qty 2

## 2015-08-20 MED ORDER — ASPIRIN 81 MG PO CHEW: 324.0000 mg | CHEWABLE_TABLET | Freq: Every day | ORAL | Status: DC

## 2015-08-20 MED ORDER — MAGNESIUM SULFATE 4 GM/100ML IV SOLN
4.0000 g | Freq: Once | INTRAVENOUS | Status: AC
  Administered 2015-08-20: 4 g via INTRAVENOUS
  Filled 2015-08-20: qty 100

## 2015-08-20 MED ORDER — LACTATED RINGERS IV SOLN
INTRAVENOUS | Status: DC | PRN
Start: 2015-08-20 — End: 2015-08-20
  Administered 2015-08-20 (×2): via INTRAVENOUS

## 2015-08-20 MED ORDER — VECURONIUM BROMIDE 10 MG IV SOLR
INTRAVENOUS | Status: DC | PRN
  Administered 2015-08-20: 5 mg via INTRAVENOUS
  Administered 2015-08-20: 3 mg via INTRAVENOUS
  Administered 2015-08-20: 5 mg via INTRAVENOUS
  Administered 2015-08-20: 2 mg via INTRAVENOUS
  Administered 2015-08-20 (×2): 5 mg via INTRAVENOUS

## 2015-08-20 MED ORDER — ONDANSETRON HCL 4 MG/2ML IJ SOLN
4.0000 mg | Freq: Four times a day (QID) | INTRAMUSCULAR | Status: DC | PRN
  Administered 2015-08-21 – 2015-08-29 (×7): 4 mg via INTRAVENOUS
  Filled 2015-08-20 (×7): qty 2

## 2015-08-20 MED ORDER — MORPHINE SULFATE (PF) 2 MG/ML IV SOLN
1.0000 mg | INTRAVENOUS | Status: DC | PRN
  Administered 2015-08-20 – 2015-08-21 (×3): 2 mg via INTRAVENOUS
  Filled 2015-08-20 (×2): qty 1
  Filled 2015-08-20: qty 2

## 2015-08-20 MED ORDER — AMIODARONE HCL IN DEXTROSE 360-4.14 MG/200ML-% IV SOLN
INTRAVENOUS | Status: AC
  Filled 2015-08-20: qty 200

## 2015-08-20 MED ORDER — OXYCODONE HCL 5 MG PO TABS
5.0000 mg | ORAL_TABLET | ORAL | Status: DC | PRN
  Administered 2015-08-21 – 2015-08-24 (×6): 10 mg via ORAL
  Filled 2015-08-20 (×6): qty 2

## 2015-08-20 MED ORDER — ANTISEPTIC ORAL RINSE SOLUTION (CORINZ): 7.0000 mL | Freq: Four times a day (QID) | OROMUCOSAL | Status: DC

## 2015-08-20 MED ORDER — MIDAZOLAM HCL 10 MG/2ML IJ SOLN
INTRAMUSCULAR | Status: AC
  Filled 2015-08-20: qty 2

## 2015-08-20 MED ORDER — ACETAMINOPHEN 160 MG/5ML PO SOLN: 650.0000 mg | Freq: Once | ORAL | Status: AC

## 2015-08-20 MED ORDER — AMIODARONE HCL IN DEXTROSE 360-4.14 MG/200ML-% IV SOLN
30.0000 mg/h | INTRAVENOUS | Status: AC
  Administered 2015-08-20: 900 mg/h via INTRAVENOUS
  Filled 2015-08-20: qty 200

## 2015-08-20 MED ORDER — LACTATED RINGERS IV SOLN
INTRAVENOUS | Status: DC
  Administered 2015-08-23: 20 mL/h via INTRAVENOUS

## 2015-08-20 MED ORDER — TRAMADOL HCL 50 MG PO TABS
50.0000 mg | ORAL_TABLET | ORAL | Status: DC | PRN
  Administered 2015-08-23 – 2015-08-24 (×2): 100 mg via ORAL
  Filled 2015-08-20 (×2): qty 2

## 2015-08-20 MED ORDER — HEPARIN SODIUM (PORCINE) 1000 UNIT/ML IJ SOLN
INTRAMUSCULAR | Status: AC
  Filled 2015-08-20: qty 1

## 2015-08-20 MED ORDER — ACETAMINOPHEN 650 MG RE SUPP
650.0000 mg | Freq: Once | RECTAL | Status: AC
  Administered 2015-08-20: 650 mg via RECTAL

## 2015-08-20 MED ORDER — SODIUM CHLORIDE 0.9% FLUSH
3.0000 mL | Freq: Two times a day (BID) | INTRAVENOUS | Status: DC
  Administered 2015-08-21 – 2015-08-25 (×7): 3 mL via INTRAVENOUS
  Administered 2015-08-26: 5 mL via INTRAVENOUS

## 2015-08-20 MED ORDER — PROPOFOL 10 MG/ML IV BOLUS
INTRAVENOUS | Status: AC
  Filled 2015-08-20: qty 20

## 2015-08-20 MED ORDER — NITROGLYCERIN IN D5W 200-5 MCG/ML-% IV SOLN: 0.0000 ug/min | INTRAVENOUS | Status: DC

## 2015-08-20 MED ORDER — VANCOMYCIN HCL IN DEXTROSE 1-5 GM/200ML-% IV SOLN
1000.0000 mg | Freq: Two times a day (BID) | INTRAVENOUS | Status: AC
Start: 2015-08-20 — End: 2015-08-21
  Administered 2015-08-20 – 2015-08-21 (×3): 1000 mg via INTRAVENOUS
  Filled 2015-08-20 (×3): qty 200

## 2015-08-20 MED ORDER — VASOPRESSIN 20 UNIT/ML IV SOLN
INTRAVENOUS | Status: AC
  Filled 2015-08-20: qty 1

## 2015-08-20 MED ORDER — DEXTROSE 5 % IV SOLN
0.0000 ug/min | INTRAVENOUS | Status: DC
  Filled 2015-08-20 (×2): qty 4

## 2015-08-20 MED ORDER — PROTAMINE SULFATE 10 MG/ML IV SOLN
INTRAVENOUS | Status: DC | PRN
  Administered 2015-08-20: 300 mg via INTRAVENOUS

## 2015-08-20 MED ORDER — PANTOPRAZOLE SODIUM 40 MG PO TBEC
40.0000 mg | DELAYED_RELEASE_TABLET | Freq: Every day | ORAL | Status: DC
  Administered 2015-08-22 – 2015-08-24 (×3): 40 mg via ORAL
  Filled 2015-08-20 (×3): qty 1

## 2015-08-20 MED ORDER — HEPARIN SODIUM (PORCINE) 1000 UNIT/ML IJ SOLN
INTRAMUSCULAR | Status: DC | PRN
  Administered 2015-08-20 (×2): 2000 [IU] via INTRAVENOUS
  Administered 2015-08-20: 31000 [IU] via INTRAVENOUS

## 2015-08-20 MED ORDER — INSULIN REGULAR BOLUS VIA INFUSION
0.0000 [IU] | Freq: Three times a day (TID) | INTRAVENOUS | Status: DC
  Filled 2015-08-20: qty 10

## 2015-08-20 MED ORDER — MIDAZOLAM HCL 5 MG/5ML IJ SOLN
INTRAMUSCULAR | Status: DC | PRN
  Administered 2015-08-20: 3 mg via INTRAVENOUS
  Administered 2015-08-20: 2 mg via INTRAVENOUS
  Administered 2015-08-20: 1 mg via INTRAVENOUS
  Administered 2015-08-20 (×3): 2 mg via INTRAVENOUS

## 2015-08-20 MED ORDER — ALBUMIN HUMAN 5 % IV SOLN: 250.0000 mL | INTRAVENOUS | Status: AC | PRN

## 2015-08-20 MED ORDER — DOPAMINE-DEXTROSE 3.2-5 MG/ML-% IV SOLN
INTRAVENOUS | Status: DC | PRN
  Administered 2015-08-20: 3 ug/kg/min via INTRAVENOUS

## 2015-08-20 MED ORDER — VANCOMYCIN HCL IN DEXTROSE 1-5 GM/200ML-% IV SOLN
1000.0000 mg | Freq: Once | INTRAVENOUS | Status: DC
  Filled 2015-08-20: qty 200

## 2015-08-20 MED ORDER — VECURONIUM BROMIDE 10 MG IV SOLR
INTRAVENOUS | Status: AC
  Filled 2015-08-20: qty 30

## 2015-08-20 MED ORDER — 0.9 % SODIUM CHLORIDE (POUR BTL) OPTIME
TOPICAL | Status: DC | PRN
  Administered 2015-08-20: 1000 mL

## 2015-08-20 MED ORDER — AMIODARONE HCL IN DEXTROSE 360-4.14 MG/200ML-% IV SOLN: 60.0000 mg/h | INTRAVENOUS | Status: AC

## 2015-08-20 MED ORDER — THIAMINE HCL 100 MG/ML IJ SOLN
100.0000 mg | Freq: Every day | INTRAMUSCULAR | Status: DC
  Administered 2015-08-20 – 2015-08-23 (×4): 100 mg via INTRAVENOUS
  Filled 2015-08-20: qty 2

## 2015-08-20 MED ORDER — 0.9 % SODIUM CHLORIDE (POUR BTL) OPTIME
TOPICAL | Status: DC | PRN
  Administered 2015-08-20: 1000 mL
  Administered 2015-08-20: 5000 mL

## 2015-08-20 MED ORDER — ROCURONIUM BROMIDE 50 MG/5ML IV SOLN
INTRAVENOUS | Status: AC
  Filled 2015-08-20: qty 1

## 2015-08-20 MED ORDER — EPINEPHRINE HCL 1 MG/ML IJ SOLN: 0.5000 ug/min | INTRAMUSCULAR | Status: DC

## 2015-08-20 MED ORDER — AMINOCAPROIC ACID 250 MG/ML IV SOLN
0.5000 g/h | INTRAVENOUS | Status: DC
  Filled 2015-08-20: qty 20

## 2015-08-20 MED ORDER — SODIUM CHLORIDE 0.45 % IV SOLN
INTRAVENOUS | Status: DC | PRN
Start: 2015-08-20 — End: 2015-08-24
  Administered 2015-08-20: 18:00:00 via INTRAVENOUS

## 2015-08-20 MED ORDER — MIDAZOLAM HCL 2 MG/2ML IJ SOLN: 2.0000 mg | INTRAMUSCULAR | Status: DC | PRN

## 2015-08-20 MED ORDER — LACTATED RINGERS IV SOLN
INTRAVENOUS | Status: DC | PRN
  Administered 2015-08-20: 10:00:00 via INTRAVENOUS

## 2015-08-20 MED ORDER — MILRINONE LACTATE IN DEXTROSE 20-5 MG/100ML-% IV SOLN
0.1250 ug/kg/min | INTRAVENOUS | Status: DC
  Administered 2015-08-20: 0.25 ug/kg/min via INTRAVENOUS
  Administered 2015-08-21 – 2015-08-22 (×2): 0.125 ug/kg/min via INTRAVENOUS
  Filled 2015-08-20 (×3): qty 100

## 2015-08-20 MED ORDER — DEXMEDETOMIDINE HCL IN NACL 200 MCG/50ML IV SOLN
0.0000 ug/kg/h | INTRAVENOUS | Status: DC
  Administered 2015-08-20: 0.7 ug/kg/h via INTRAVENOUS
  Administered 2015-08-21: 0.2 ug/kg/h via INTRAVENOUS
  Filled 2015-08-20 (×2): qty 50

## 2015-08-20 MED ORDER — METOPROLOL TARTRATE 25 MG/10 ML ORAL SUSPENSION: 12.5000 mg | Freq: Two times a day (BID) | ORAL | Status: DC

## 2015-08-20 MED ORDER — VECURONIUM BROMIDE 10 MG IV SOLR: INTRAVENOUS | Status: DC | PRN

## 2015-08-20 MED ORDER — NOREPINEPHRINE BITARTRATE 1 MG/ML IV SOLN
0.0000 ug/min | INTRAVENOUS | Status: DC
  Filled 2015-08-20: qty 4

## 2015-08-20 MED ORDER — POTASSIUM CHLORIDE 10 MEQ/50ML IV SOLN
10.0000 meq | INTRAVENOUS | Status: AC
  Administered 2015-08-20 (×3): 10 meq via INTRAVENOUS

## 2015-08-20 MED ORDER — DOPAMINE-DEXTROSE 3.2-5 MG/ML-% IV SOLN
0.0000 ug/kg/min | INTRAVENOUS | Status: DC
Start: 2015-08-20 — End: 2015-08-22
  Administered 2015-08-22: 3 ug/kg/min via INTRAVENOUS
  Filled 2015-08-20: qty 250

## 2015-08-20 MED ORDER — SUCCINYLCHOLINE CHLORIDE 200 MG/10ML IV SOSY
PREFILLED_SYRINGE | INTRAVENOUS | Status: AC
  Filled 2015-08-20: qty 10

## 2015-08-20 MED ORDER — LACTATED RINGERS IV SOLN
INTRAVENOUS | Status: DC
  Administered 2015-08-25: 10:00:00 via INTRAVENOUS

## 2015-08-20 MED ORDER — MIDAZOLAM HCL 2 MG/2ML IJ SOLN
INTRAMUSCULAR | Status: AC
  Filled 2015-08-20: qty 2

## 2015-08-20 MED ORDER — PROPOFOL 10 MG/ML IV BOLUS
INTRAVENOUS | Status: DC | PRN
  Administered 2015-08-20: 50 mg via INTRAVENOUS

## 2015-08-20 MED ORDER — ACETAMINOPHEN 500 MG PO TABS
1000.0000 mg | ORAL_TABLET | Freq: Four times a day (QID) | ORAL | Status: AC
  Administered 2015-08-21 – 2015-08-24 (×7): 1000 mg via ORAL
  Filled 2015-08-20 (×10): qty 2

## 2015-08-20 MED ORDER — AMIODARONE HCL IN DEXTROSE 360-4.14 MG/200ML-% IV SOLN
30.0000 mg/h | INTRAVENOUS | Status: DC
  Administered 2015-08-21 – 2015-08-23 (×2): 30 mg/h via INTRAVENOUS
  Filled 2015-08-20 (×5): qty 200

## 2015-08-20 MED ORDER — NOREPINEPHRINE BITARTRATE 1 MG/ML IV SOLN
4000.0000 ug | INTRAVENOUS | Status: DC | PRN
  Administered 2015-08-20: 3 ug/min via INTRAVENOUS

## 2015-08-20 MED ORDER — FENTANYL CITRATE (PF) 100 MCG/2ML IJ SOLN
INTRAMUSCULAR | Status: DC | PRN
  Administered 2015-08-20 (×2): 100 ug via INTRAVENOUS
  Administered 2015-08-20: 150 ug via INTRAVENOUS
  Administered 2015-08-20: 100 ug via INTRAVENOUS
  Administered 2015-08-20: 150 ug via INTRAVENOUS
  Administered 2015-08-20: 550 ug via INTRAVENOUS
  Administered 2015-08-20: 100 ug via INTRAVENOUS

## 2015-08-20 MED ORDER — THIAMINE HCL 100 MG/ML IJ SOLN
100.0000 mg | Freq: Every day | INTRAMUSCULAR | Status: DC
  Administered 2015-08-21: 100 mg via INTRAVENOUS
  Filled 2015-08-20 (×3): qty 2

## 2015-08-20 MED ORDER — SODIUM CHLORIDE 0.9 % IV SOLN: 250.0000 mL | INTRAVENOUS | Status: DC

## 2015-08-20 MED ORDER — CHLORHEXIDINE GLUCONATE 0.12 % MT SOLN
15.0000 mL | OROMUCOSAL | Status: AC
  Administered 2015-08-20: 15 mL via OROMUCOSAL
  Filled 2015-08-20: qty 15

## 2015-08-20 MED ORDER — SODIUM CHLORIDE 0.9 % IJ SOLN
OROMUCOSAL | Status: DC | PRN
  Administered 2015-08-20: 12 mL via TOPICAL

## 2015-08-20 MED ORDER — LACTATED RINGERS IV SOLN: 500.0000 mL | Freq: Once | INTRAVENOUS | Status: DC | PRN

## 2015-08-20 MED ORDER — AMIODARONE HCL IN DEXTROSE 360-4.14 MG/200ML-% IV SOLN
30.0000 mg/h | INTRAVENOUS | Status: DC
  Administered 2015-08-21 – 2015-08-22 (×3): 30 mg/h via INTRAVENOUS
  Filled 2015-08-20 (×2): qty 200

## 2015-08-20 MED ORDER — PHENYLEPHRINE HCL 10 MG/ML IJ SOLN
0.0000 ug/min | INTRAMUSCULAR | Status: DC
  Filled 2015-08-20: qty 2

## 2015-08-20 MED ORDER — AMIODARONE HCL IN DEXTROSE 360-4.14 MG/200ML-% IV SOLN: 30.0000 mg/h | INTRAVENOUS | Status: DC

## 2015-08-20 MED ORDER — MILRINONE LACTATE IN DEXTROSE 20-5 MG/100ML-% IV SOLN
0.3750 ug/kg/min | INTRAVENOUS | Status: AC
  Administered 2015-08-20: .3 ug/kg/min via INTRAVENOUS
  Filled 2015-08-20: qty 100

## 2015-08-20 MED ORDER — ALBUMIN HUMAN 5 % IV SOLN
INTRAVENOUS | Status: DC | PRN
  Administered 2015-08-20: 17:00:00 via INTRAVENOUS

## 2015-08-20 MED ORDER — SODIUM CHLORIDE 0.9% FLUSH
3.0000 mL | INTRAVENOUS | Status: DC | PRN
  Administered 2015-08-22: 3 mL via INTRAVENOUS
  Filled 2015-08-20: qty 3

## 2015-08-20 MED ORDER — METOPROLOL TARTRATE 5 MG/5ML IV SOLN
2.5000 mg | INTRAVENOUS | Status: DC | PRN
  Administered 2015-08-21: 5 mg via INTRAVENOUS
  Administered 2015-08-21: 2.5 mg via INTRAVENOUS
  Administered 2015-08-22 – 2015-09-01 (×15): 5 mg via INTRAVENOUS
  Filled 2015-08-20 (×16): qty 5

## 2015-08-20 MED ORDER — FAMOTIDINE IN NACL 20-0.9 MG/50ML-% IV SOLN
20.0000 mg | Freq: Two times a day (BID) | INTRAVENOUS | Status: AC
  Administered 2015-08-20 (×2): 20 mg via INTRAVENOUS
  Filled 2015-08-20: qty 50

## 2015-08-20 MED ORDER — BISACODYL 10 MG RE SUPP
10.0000 mg | Freq: Every day | RECTAL | Status: DC
  Administered 2015-08-24 – 2015-09-03 (×7): 10 mg via RECTAL
  Filled 2015-08-20 (×8): qty 1

## 2015-08-20 MED ORDER — SODIUM CHLORIDE 0.9 % IV SOLN
INTRAVENOUS | Status: DC
  Administered 2015-08-20: 20:00:00 via INTRAVENOUS
  Filled 2015-08-20 (×2): qty 2.5

## 2015-08-20 SURGICAL SUPPLY — 117 items
ADAPTER CARDIO PERF ANTE/RETRO (ADAPTER) ×4 IMPLANT
ATRICLIP EXCLUSION 40 STD HAND (Clip) ×4 IMPLANT
BAG DECANTER FOR FLEXI CONT (MISCELLANEOUS) ×4 IMPLANT
BANDAGE ACE 4X5 VEL STRL LF (GAUZE/BANDAGES/DRESSINGS) ×4 IMPLANT
BANDAGE ACE 6X5 VEL STRL LF (GAUZE/BANDAGES/DRESSINGS) ×4 IMPLANT
BANDAGE ELASTIC 4 VELCRO ST LF (GAUZE/BANDAGES/DRESSINGS) ×4 IMPLANT
BANDAGE ELASTIC 6 VELCRO ST LF (GAUZE/BANDAGES/DRESSINGS) ×4 IMPLANT
BASKET HEART  (ORDER IN 25'S) (MISCELLANEOUS) ×1
BASKET HEART (ORDER IN 25'S) (MISCELLANEOUS) ×1
BASKET HEART (ORDER IN 25S) (MISCELLANEOUS) ×2 IMPLANT
BLADE STERNUM SYSTEM 6 (BLADE) ×4 IMPLANT
BLADE SURG 12 STRL SS (BLADE) ×4 IMPLANT
BLADE SURG ROTATE 9660 (MISCELLANEOUS) IMPLANT
BNDG GAUZE ELAST 4 BULKY (GAUZE/BANDAGES/DRESSINGS) ×4 IMPLANT
CANISTER SUCTION 2500CC (MISCELLANEOUS) ×4 IMPLANT
CANNULA GUNDRY RCSP 15FR (MISCELLANEOUS) ×4 IMPLANT
CARDIOBLATE CARDIAC ABLATION (MISCELLANEOUS)
CATH CPB KIT VANTRIGT (MISCELLANEOUS) ×4 IMPLANT
CATH ROBINSON RED A/P 18FR (CATHETERS) ×12 IMPLANT
CATH THORACIC 36FR RT ANG (CATHETERS) ×8 IMPLANT
CLIP FOGARTY SPRING 6M (CLIP) ×4 IMPLANT
CLIP RETRACTION 3.0MM CORONARY (MISCELLANEOUS) ×4 IMPLANT
CLIP TI WIDE RED SMALL 24 (CLIP) ×4 IMPLANT
CONN 1/2X1/2X1/2  BEN (MISCELLANEOUS) ×2
CONN 1/2X1/2X1/2 BEN (MISCELLANEOUS) ×2 IMPLANT
CONN 3/8X1/2 ST GISH (MISCELLANEOUS) ×8 IMPLANT
COVER SURGICAL LIGHT HANDLE (MISCELLANEOUS) ×4 IMPLANT
CRADLE DONUT ADULT HEAD (MISCELLANEOUS) ×4 IMPLANT
DEVICE CARDIOBLATE CARDIAC ABL (MISCELLANEOUS) IMPLANT
DRAIN CHANNEL 32F RND 10.7 FF (WOUND CARE) ×4 IMPLANT
DRAPE CARDIOVASCULAR INCISE (DRAPES) ×2
DRAPE SLUSH/WARMER DISC (DRAPES) ×4 IMPLANT
DRAPE SRG 135X102X78XABS (DRAPES) ×2 IMPLANT
DRSG AQUACEL AG ADV 3.5X14 (GAUZE/BANDAGES/DRESSINGS) ×4 IMPLANT
ELECT BLADE 4.0 EZ CLEAN MEGAD (MISCELLANEOUS) ×4
ELECT BLADE 6.5 EXT (BLADE) ×4 IMPLANT
ELECT CAUTERY BLADE 6.4 (BLADE) ×4 IMPLANT
ELECT REM PT RETURN 9FT ADLT (ELECTROSURGICAL) ×8
ELECTRODE BLDE 4.0 EZ CLN MEGD (MISCELLANEOUS) ×2 IMPLANT
ELECTRODE REM PT RTRN 9FT ADLT (ELECTROSURGICAL) ×4 IMPLANT
FELT TEFLON 1X6 (MISCELLANEOUS) ×4 IMPLANT
GAUZE SPONGE 4X4 12PLY STRL (GAUZE/BANDAGES/DRESSINGS) ×8 IMPLANT
GLOVE BIO SURGEON STRL SZ 6.5 (GLOVE) ×15 IMPLANT
GLOVE BIO SURGEON STRL SZ7 (GLOVE) ×12 IMPLANT
GLOVE BIO SURGEON STRL SZ7.5 (GLOVE) ×12 IMPLANT
GLOVE BIO SURGEONS STRL SZ 6.5 (GLOVE) ×5
GOWN STRL REUS W/ TWL LRG LVL3 (GOWN DISPOSABLE) ×8 IMPLANT
GOWN STRL REUS W/TWL LRG LVL3 (GOWN DISPOSABLE) ×8
HEMOSTAT POWDER SURGIFOAM 1G (HEMOSTASIS) ×12 IMPLANT
HEMOSTAT SURGICEL 2X14 (HEMOSTASIS) ×4 IMPLANT
INSERT FOGARTY XLG (MISCELLANEOUS) IMPLANT
KIT BASIN OR (CUSTOM PROCEDURE TRAY) ×4 IMPLANT
KIT ROOM TURNOVER OR (KITS) ×4 IMPLANT
KIT SUCTION CATH 14FR (SUCTIONS) ×4 IMPLANT
KIT VASOVIEW 6 PRO VH 2400 (KITS) ×4 IMPLANT
LEAD PACING MYOCARDI (MISCELLANEOUS) ×4 IMPLANT
LOOP VESSEL SUPERMAXI WHITE (MISCELLANEOUS) ×4 IMPLANT
MARKER GRAFT CORONARY BYPASS (MISCELLANEOUS) ×12 IMPLANT
NS IRRIG 1000ML POUR BTL (IV SOLUTION) ×20 IMPLANT
PACK OPEN HEART (CUSTOM PROCEDURE TRAY) ×4 IMPLANT
PAD ARMBOARD 7.5X6 YLW CONV (MISCELLANEOUS) ×8 IMPLANT
PAD ELECT DEFIB RADIOL ZOLL (MISCELLANEOUS) ×4 IMPLANT
PENCIL BUTTON HOLSTER BLD 10FT (ELECTRODE) ×4 IMPLANT
PROBE CRYO2-ABLATION MALLABLE (MISCELLANEOUS) IMPLANT
PUNCH AORTIC ROTATE  4.5MM 8IN (MISCELLANEOUS) ×4 IMPLANT
PUNCH AORTIC ROTATE 4.0MM (MISCELLANEOUS) IMPLANT
PUNCH AORTIC ROTATE 4.5MM 8IN (MISCELLANEOUS) IMPLANT
PUNCH AORTIC ROTATE 5MM 8IN (MISCELLANEOUS) IMPLANT
SET CARDIOPLEGIA MPS 5001102 (MISCELLANEOUS) ×4 IMPLANT
SOLUTION ANTI FOG 6CC (MISCELLANEOUS) ×4 IMPLANT
SPONGE LAP 18X18 X RAY DECT (DISPOSABLE) ×8 IMPLANT
SPONGE LAP 4X18 X RAY DECT (DISPOSABLE) ×4 IMPLANT
SURGIFLO W/THROMBIN 8M KIT (HEMOSTASIS) ×4 IMPLANT
SUT BONE WAX W31G (SUTURE) ×4 IMPLANT
SUT ETHIBOND 2 0 SH (SUTURE) ×10
SUT ETHIBOND 2 0 SH 36X2 (SUTURE) ×10 IMPLANT
SUT MNCRL AB 4-0 PS2 18 (SUTURE) IMPLANT
SUT PROLENE 3 0 SH DA (SUTURE) IMPLANT
SUT PROLENE 3 0 SH1 36 (SUTURE) IMPLANT
SUT PROLENE 4 0 RB 1 (SUTURE) ×6
SUT PROLENE 4 0 SH DA (SUTURE) ×4 IMPLANT
SUT PROLENE 4-0 RB1 .5 CRCL 36 (SUTURE) ×6 IMPLANT
SUT PROLENE 5 0 C 1 36 (SUTURE) ×12 IMPLANT
SUT PROLENE 6 0 C 1 30 (SUTURE) ×28 IMPLANT
SUT PROLENE 6 0 CC (SUTURE) ×12 IMPLANT
SUT PROLENE 8 0 BV175 6 (SUTURE) ×12 IMPLANT
SUT PROLENE BLUE 7 0 (SUTURE) ×4 IMPLANT
SUT SILK  1 MH (SUTURE) ×6
SUT SILK 1 MH (SUTURE) ×6 IMPLANT
SUT SILK 1 TIES 10X30 (SUTURE) ×4 IMPLANT
SUT SILK 2 0 SH CR/8 (SUTURE) ×12 IMPLANT
SUT SILK 2 0 TIES 10X30 (SUTURE) ×4 IMPLANT
SUT SILK 2 0 TIES 17X18 (SUTURE) ×2
SUT SILK 2-0 18XBRD TIE BLK (SUTURE) ×2 IMPLANT
SUT SILK 3 0 SH CR/8 (SUTURE) ×8 IMPLANT
SUT SILK 4 0 TIE 10X30 (SUTURE) ×8 IMPLANT
SUT STEEL 6MS V (SUTURE) IMPLANT
SUT STEEL SZ 6 DBL 3X14 BALL (SUTURE) ×12 IMPLANT
SUT TEM PAC WIRE 2 0 SH (SUTURE) ×12 IMPLANT
SUT VIC AB 1 CTX 36 (SUTURE) ×6
SUT VIC AB 1 CTX36XBRD ANBCTR (SUTURE) ×6 IMPLANT
SUT VIC AB 2-0 CT1 27 (SUTURE) ×2
SUT VIC AB 2-0 CT1 TAPERPNT 27 (SUTURE) ×2 IMPLANT
SUT VIC AB 2-0 CTX 27 (SUTURE) ×8 IMPLANT
SUT VIC AB 3-0 X1 27 (SUTURE) ×4 IMPLANT
SUT VICRYL 2 0 J607H (SUTURE) ×8 IMPLANT
SUT VICRYL 3 0 (SUTURE) ×8 IMPLANT
SUTURE E-PAK OPEN HEART (SUTURE) ×4 IMPLANT
SYSTEM SAHARA CHEST DRAIN ATS (WOUND CARE) ×4 IMPLANT
TAPE CLOTH SURG 6X10 WHT LF (GAUZE/BANDAGES/DRESSINGS) ×4 IMPLANT
TAPE PAPER 2X10 WHT MICROPORE (GAUZE/BANDAGES/DRESSINGS) ×4 IMPLANT
TOWEL OR 17X24 6PK STRL BLUE (TOWEL DISPOSABLE) ×8 IMPLANT
TOWEL OR 17X26 10 PK STRL BLUE (TOWEL DISPOSABLE) ×8 IMPLANT
TRAY FOLEY IC TEMP SENS 16FR (CATHETERS) ×4 IMPLANT
TUBING INSUFFLATION (TUBING) ×4 IMPLANT
UNDERPAD 30X30 INCONTINENT (UNDERPADS AND DIAPERS) ×4 IMPLANT
WATER STERILE IRR 1000ML POUR (IV SOLUTION) ×8 IMPLANT

## 2015-08-20 NOTE — Transfer of Care (Signed)
Immediate Anesthesia Transfer of Care Note  Patient: Victor Diaz  Procedure(s) Performed: Procedure(s): CORONARY ARTERY BYPASS GRAFTING (CABG) TIMES FOUR  UTILIZING THE LEFT INTERNAL MAMMARY ARTERY AND ENDOSCOPICALLY HARVESTED RIGHT SAPHENEOUS VEINS. CLIPPING OF ATRIAL APPENDAGE (N/A) TRANSESOPHAGEAL ECHOCARDIOGRAM (TEE) (N/A)  Patient Location: ICU  Anesthesia Type:General  Level of Consciousness: sedated and Patient remains intubated per anesthesia plan  Airway & Oxygen Therapy: Patient remains intubated per anesthesia plan and Patient placed on Ventilator (see vital sign flow sheet for setting)  Post-op Assessment: Report given to RN and Post -op Vital signs reviewed and stable  Post vital signs: Reviewed and stable  Last Vitals:  Filed Vitals:   08/19/15 2207 08/20/15 0425  BP: 140/58 123/73  Pulse: 87 77  Temp:  36.7 C  Resp: 20 16    Last Pain:  Filed Vitals:   08/20/15 0808  PainSc: 0-No pain         Complications: No apparent anesthesia complications

## 2015-08-20 NOTE — Progress Notes (Signed)
The patient was examined and preop studies reviewed. There has been no change from the prior exam and the patient is ready for surgery.  plan CABG, Maze procedure on M Mccolgan

## 2015-08-20 NOTE — Anesthesia Procedure Notes (Addendum)
Central Venous Catheter Insertion Performed by: anesthesiologist 08/20/2015 9:19 AM Patient location: Pre-op. Preanesthetic checklist: patient identified, IV checked, site marked, risks and benefits discussed, surgical consent, monitors and equipment checked, pre-op evaluation, timeout performed and anesthesia consent Position: Trendelenburg Lidocaine 1% used for infiltration Landmarks identified and Seldinger technique used Catheter size: 9 Fr Central line was placed.MAC introducer Swan type and PA catheter depth:thermodilution and 50PA Cath depth:50 Procedure performed using ultrasound guided technique. Attempts: 1 Following insertion, line sutured and dressing applied. Post procedure assessment: blood return through all ports, free fluid flow and no air. Patient tolerated the procedure well with no immediate complications.   Procedure Name: Intubation Date/Time: 08/20/2015 10:40 AM Performed by: Shirlyn Goltz Pre-anesthesia Checklist: Patient identified, Emergency Drugs available, Suction available and Patient being monitored Patient Re-evaluated:Patient Re-evaluated prior to inductionOxygen Delivery Method: Circle system utilized Preoxygenation: Pre-oxygenation with 100% oxygen Intubation Type: IV induction Ventilation: Mask ventilation without difficulty, Two handed mask ventilation required and Oral airway inserted - appropriate to patient size Laryngoscope Size: Glidescope and 4 Grade View: Grade II Tube type: Oral Tube size: 7.5 mm Number of attempts: 1 Airway Equipment and Method: Stylet and Video-laryngoscopy Placement Confirmation: ETT inserted through vocal cords under direct vision,  positive ETCO2 and breath sounds checked- equal and bilateral Secured at: 22 cm Tube secured with: Tape Dental Injury: Teeth and Oropharynx as per pre-operative assessment

## 2015-08-20 NOTE — Brief Op Note (Signed)
08/19/2015 - 08/20/2015  3:08 PM      [address]       ,[place] [address]             [phone]     08/19/2015 - 08/20/2015  3:08 PM  PATIENT:  Victor Diaz  70 y.o. male  PRE-OPERATIVE DIAGNOSIS:  CAD  POST-OPERATIVE DIAGNOSIS:  CAD  PROCEDURE:  Procedure(s): CORONARY ARTERY BYPASS GRAFTING (CABG) TIMES FOUR  UTILIZING THE LEFT INTERNAL MAMMARY ARTERY AND ENDOSCOPICALLY HARVESTED RIGHT SAPHENEOUS LIMA-LAD; SVG-OM; SVG-RCA; SVG-DIAG  VEINS. CLIPPING OF ATRIAL APPENDAGE TRANSESOPHAGEAL ECHOCARDIOGRAM (TEE)  SURGEON:  Surgeon(s): Ivin Poot, MD  PHYSICIAN ASSISTANT: WAYNE GOLD PA-C  ANESTHESIA:   general  PATIENT CONDITION:  ICU - intubated and hemodynamically stable.  PRE-OPERATIVE WEIGHT: 90kg  COMP: NO KNOWN

## 2015-08-20 NOTE — Anesthesia Postprocedure Evaluation (Signed)
Anesthesia Post Note  Patient: KERWIN POEHLMAN  Procedure(s) Performed: Procedure(s) (LRB): CORONARY ARTERY BYPASS GRAFTING (CABG) TIMES FOUR  UTILIZING THE LEFT INTERNAL MAMMARY ARTERY AND ENDOSCOPICALLY HARVESTED RIGHT SAPHENEOUS VEINS. CLIPPING OF ATRIAL APPENDAGE (N/A) TRANSESOPHAGEAL ECHOCARDIOGRAM (TEE) (N/A)  Patient location during evaluation: SICU Anesthesia Type: General Level of consciousness: sedated Pain management: pain level controlled Vital Signs Assessment: post-procedure vital signs reviewed and stable Respiratory status: patient remains intubated per anesthesia plan Cardiovascular status: stable Anesthetic complications: no    Last Vitals:  Filed Vitals:   08/20/15 1745 08/20/15 1800  BP:    Pulse: 88 89  Temp: 36.6 C 36.6 C  Resp: 16 12    Last Pain:  Filed Vitals:   08/20/15 1800  PainSc: 0-No pain                 Evann Koelzer DAVID

## 2015-08-20 NOTE — Progress Notes (Signed)
CT surgery p.m. Rounds  Status post CABG 4 with left atrial clip Sedated on ventilator Maintaining sinus rhythm with stable hemodynamics Coagulopathy with thrombocytopenia and elevated INR-blood products are infusing Hemodynamics are stable with cardiac output greater than 5 L/min

## 2015-08-20 NOTE — Anesthesia Postprocedure Evaluation (Signed)
Anesthesia Post Note  Patient: Victor Diaz  Procedure(s) Performed: Procedure(s) (LRB): CORONARY ARTERY BYPASS GRAFTING (CABG) TIMES FOUR  UTILIZING THE LEFT INTERNAL MAMMARY ARTERY AND ENDOSCOPICALLY HARVESTED RIGHT SAPHENEOUS VEINS. CLIPPING OF ATRIAL APPENDAGE (N/A) TRANSESOPHAGEAL ECHOCARDIOGRAM (TEE) (N/A)  Patient location during evaluation: PACU Anesthesia Type: General Level of consciousness: awake and alert Pain management: pain level controlled Vital Signs Assessment: post-procedure vital signs reviewed and stable Respiratory status: spontaneous breathing, nonlabored ventilation, respiratory function stable and patient connected to nasal cannula oxygen Cardiovascular status: blood pressure returned to baseline and stable Postop Assessment: no signs of nausea or vomiting Anesthetic complications: no    Last Vitals:  Filed Vitals:   08/20/15 0425 08/20/15 1745  BP: 123/73   Pulse: 77 88  Temp: 36.7 C 36.6 C  Resp: 16 16    Last Pain:  Filed Vitals:   08/20/15 1751  PainSc: 0-No pain                 Eryka Dolinger DAVID

## 2015-08-20 NOTE — Progress Notes (Signed)
  Echocardiogram Echocardiogram Transesophageal has been performed.  Victor Diaz 08/20/2015, 11:43 AM

## 2015-08-20 NOTE — OR Nursing (Signed)
SICU Calls:  First call at 15:48pm to [place]; Second call at 16:20pm to [place]; Third call at 16:57pm to [place]

## 2015-08-21 ENCOUNTER — Inpatient Hospital Stay (HOSPITAL_COMMUNITY): Payer: Medicare Other

## 2015-08-21 LAB — POCT I-STAT 3, ART BLOOD GAS (G3+)
ACID-BASE DEFICIT: 5 mmol/L — AB (ref 0.0–2.0)
Acid-base deficit: 4 mmol/L — ABNORMAL HIGH (ref 0.0–2.0)
Bicarbonate: 21.2 mEq/L (ref 20.0–24.0)
Bicarbonate: 21.9 mEq/L (ref 20.0–24.0)
O2 Saturation: 88 %
O2 Saturation: 91 %
PCO2 ART: 40.4 mmHg (ref 35.0–45.0)
PCO2 ART: 41.8 mmHg (ref 35.0–45.0)
PH ART: 7.312 — AB (ref 7.350–7.450)
PH ART: 7.342 — AB (ref 7.350–7.450)
PO2 ART: 58 mmHg — AB (ref 80.0–100.0)
Patient temperature: 36.6
TCO2: 23 mmol/L (ref 0–100)
TCO2: 23 mmol/L (ref 0–100)
pO2, Arterial: 64 mmHg — ABNORMAL LOW (ref 80.0–100.0)

## 2015-08-21 LAB — GLUCOSE, CAPILLARY
GLUCOSE-CAPILLARY: 135 mg/dL — AB (ref 65–99)
GLUCOSE-CAPILLARY: 114 mg/dL — AB (ref 65–99)
GLUCOSE-CAPILLARY: 112 mg/dL — AB (ref 65–99)
GLUCOSE-CAPILLARY: 118 mg/dL — AB (ref 65–99)
GLUCOSE-CAPILLARY: 102 mg/dL — AB (ref 65–99)
GLUCOSE-CAPILLARY: 144 mg/dL — AB (ref 65–99)
GLUCOSE-CAPILLARY: 113 mg/dL — AB (ref 65–99)
Glucose-Capillary: 109 mg/dL — ABNORMAL HIGH (ref 65–99)
Glucose-Capillary: 117 mg/dL — ABNORMAL HIGH (ref 65–99)
Glucose-Capillary: 118 mg/dL — ABNORMAL HIGH (ref 65–99)
Glucose-Capillary: 120 mg/dL — ABNORMAL HIGH (ref 65–99)
Glucose-Capillary: 125 mg/dL — ABNORMAL HIGH (ref 65–99)
Glucose-Capillary: 139 mg/dL — ABNORMAL HIGH (ref 65–99)
Glucose-Capillary: 146 mg/dL — ABNORMAL HIGH (ref 65–99)
Glucose-Capillary: 154 mg/dL — ABNORMAL HIGH (ref 65–99)
Glucose-Capillary: 183 mg/dL — ABNORMAL HIGH (ref 65–99)

## 2015-08-21 LAB — BASIC METABOLIC PANEL
Anion gap: 9 (ref 5–15)
BUN: 7 mg/dL (ref 6–20)
CHLORIDE: 105 mmol/L (ref 101–111)
CO2: 22 mmol/L (ref 22–32)
Calcium: 7.8 mg/dL — ABNORMAL LOW (ref 8.9–10.3)
Creatinine, Ser: 1.01 mg/dL (ref 0.61–1.24)
GFR calc Af Amer: >60 mL/min (ref >60)
GFR calc non Af Amer: >60 mL/min (ref >60)
GLUCOSE: 115 mg/dL — AB (ref 65–99)
POTASSIUM: 3.9 mmol/L (ref 3.5–5.1)
Sodium: 136 mmol/L (ref 135–145)

## 2015-08-21 LAB — CBC
HCT: 26.2 % — ABNORMAL LOW (ref 39.0–52.0)
HCT: 25.4 % — ABNORMAL LOW (ref 39.0–52.0)
HEMOGLOBIN: 9.1 g/dL — AB (ref 13.0–17.0)
Hemoglobin: 8.7 g/dL — ABNORMAL LOW (ref 13.0–17.0)
MCH: 33.8 pg (ref 26.0–34.0)
MCH: 33 pg (ref 26.0–34.0)
MCHC: 34.7 g/dL (ref 30.0–36.0)
MCHC: 34.3 g/dL (ref 30.0–36.0)
MCV: 97.4 fL (ref 78.0–100.0)
MCV: 96.2 fL (ref 78.0–100.0)
PLATELETS: 75 10*3/uL — AB (ref 150–400)
Platelets: 91 10*3/uL — ABNORMAL LOW (ref 150–400)
RBC: 2.69 MIL/uL — AB (ref 4.22–5.81)
RBC: 2.64 MIL/uL — AB (ref 4.22–5.81)
RDW: 15 % (ref 11.5–15.5)
RDW: 15 % (ref 11.5–15.5)
WBC: 13 10*3/uL — ABNORMAL HIGH (ref 4.0–10.5)
WBC: 12.5 10*3/uL — AB (ref 4.0–10.5)

## 2015-08-21 LAB — PREPARE FRESH FROZEN PLASMA
UNIT DIVISION: 0
Unit division: 0
Unit division: 0
Unit division: 0

## 2015-08-21 LAB — POCT I-STAT, CHEM 8
BUN: 6 mg/dL (ref 6–20)
CALCIUM ION: 1.09 mmol/L — AB (ref 1.13–1.30)
CHLORIDE: 101 mmol/L (ref 101–111)
Creatinine, Ser: 0.9 mg/dL (ref 0.61–1.24)
Glucose, Bld: 134 mg/dL — ABNORMAL HIGH (ref 65–99)
HCT: 27 % — ABNORMAL LOW (ref 39.0–52.0)
HEMOGLOBIN: 9.2 g/dL — AB (ref 13.0–17.0)
Potassium: 3.8 mmol/L (ref 3.5–5.1)
SODIUM: 136 mmol/L (ref 135–145)
TCO2: 21 mmol/L (ref 0–100)

## 2015-08-21 LAB — PREPARE PLATELET PHERESIS: UNIT DIVISION: 0

## 2015-08-21 LAB — CREATININE, SERUM
Creatinine, Ser: 1.07 mg/dL (ref 0.61–1.24)
GFR calc Af Amer: >60 mL/min (ref >60)
GFR calc non Af Amer: >60 mL/min (ref >60)

## 2015-08-21 LAB — MAGNESIUM
MAGNESIUM: 2.4 mg/dL (ref 1.7–2.4)
Magnesium: 2.4 mg/dL (ref 1.7–2.4)

## 2015-08-21 MED ORDER — POTASSIUM CHLORIDE 10 MEQ/50ML IV SOLN: 10.0000 meq | INTRAVENOUS | Status: AC

## 2015-08-21 MED ORDER — INSULIN DETEMIR 100 UNIT/ML ~~LOC~~ SOLN
12.0000 [IU] | Freq: Two times a day (BID) | SUBCUTANEOUS | Status: DC
  Administered 2015-08-21 – 2015-08-25 (×9): 12 [IU] via SUBCUTANEOUS
  Filled 2015-08-21 (×12): qty 0.12

## 2015-08-21 MED ORDER — FUROSEMIDE 10 MG/ML IJ SOLN
20.0000 mg | Freq: Two times a day (BID) | INTRAMUSCULAR | Status: DC
  Administered 2015-08-21 – 2015-08-23 (×5): 20 mg via INTRAVENOUS
  Filled 2015-08-21 (×5): qty 2

## 2015-08-21 MED ORDER — POTASSIUM CHLORIDE 10 MEQ/50ML IV SOLN
10.0000 meq | INTRAVENOUS | Status: AC
  Administered 2015-08-21 (×2): 10 meq via INTRAVENOUS
  Filled 2015-08-21 (×2): qty 50

## 2015-08-21 MED ORDER — SIMETHICONE 80 MG PO CHEW
80.0000 mg | CHEWABLE_TABLET | Freq: Four times a day (QID) | ORAL | Status: DC
  Administered 2015-08-21 – 2015-08-24 (×8): 80 mg via ORAL
  Filled 2015-08-21 (×10): qty 1

## 2015-08-21 MED ORDER — MIDAZOLAM HCL 2 MG/2ML IJ SOLN: 2.0000 mg | INTRAMUSCULAR | Status: DC | PRN

## 2015-08-21 MED ORDER — ALPRAZOLAM 0.5 MG PO TABS
0.5000 mg | ORAL_TABLET | Freq: Two times a day (BID) | ORAL | Status: DC | PRN
  Administered 2015-08-21 – 2015-08-23 (×3): 0.5 mg via ORAL
  Filled 2015-08-21 (×3): qty 1

## 2015-08-21 MED ORDER — CETYLPYRIDINIUM CHLORIDE 0.05 % MT LIQD
7.0000 mL | Freq: Two times a day (BID) | OROMUCOSAL | Status: DC
  Administered 2015-08-21 – 2015-08-31 (×18): 7 mL via OROMUCOSAL

## 2015-08-21 MED ORDER — INSULIN ASPART 100 UNIT/ML ~~LOC~~ SOLN
0.0000 [IU] | SUBCUTANEOUS | Status: DC
  Administered 2015-08-21 (×2): 2 [IU] via SUBCUTANEOUS
  Administered 2015-08-21: 4 [IU] via SUBCUTANEOUS
  Administered 2015-08-22 – 2015-08-23 (×6): 2 [IU] via SUBCUTANEOUS

## 2015-08-21 MED ORDER — ASPIRIN EC 81 MG PO TBEC
81.0000 mg | DELAYED_RELEASE_TABLET | Freq: Every day | ORAL | Status: DC
  Administered 2015-08-22 – 2015-08-24 (×3): 81 mg via ORAL
  Filled 2015-08-21 (×3): qty 1

## 2015-08-21 NOTE — Procedures (Signed)
Extubation Procedure Note  Patient Details:   Name: Victor Diaz DOB: [birth year] MRN: [id number]   Airway Documentation:  Airway 7.5 mm (Active)  Secured at (cm) 22 cm 08/20/2015  7:39 PM  Measured From Lips 08/20/2015  7:39 PM  Secured Location Right 08/20/2015  7:39 PM  Secured By Pink Tape 08/20/2015  7:39 PM  Site Condition Dry 08/20/2015  7:39 PM    Evaluation  O2 sats: stable throughout Complications: No apparent complications Patient did tolerate procedure well. Bilateral Breath Sounds: Clear   Yes  Pt extubated to [facility]  , stable at this time and able to speak his name. RT will continue to monitor.  Tiondra Fang D Bland Span 08/21/2015, 12:06 AM

## 2015-08-21 NOTE — Procedures (Signed)
700 VC , -22 NIF, good pt effort.

## 2015-08-21 NOTE — Op Note (Signed)
NAMEMarland Diaz  KHOLE, CANIZARES NO.:  [id number]  MEDICAL RECORD NO.:  [id number]  LOCATION:  2S05C                        FACILITY:  [facility]  PHYSICIAN:  Ivin Poot, M.D.  DATE OF BIRTH:  1945/12/20  DATE OF PROCEDURE:  08/20/2015 DATE OF DISCHARGE:                              OPERATIVE REPORT   OPERATION: 1. Coronary artery bypass grafting x4 (left internal mammary artery to     LAD, saphenous vein graft to diagonal, saphenous vein graft to     circumflex marginal, saphenous vein graft to posterior descending). 2. Placement of left atrial clip - 40 mm AtriCure device. 3. Endoscopic harvest of right leg greater saphenous vein.  SURGEON:  Ivin Poot, M.D.  ASSISTANT:  John Giovanni, P.A.-C.  PREOPERATIVE DIAGNOSES:  Severe three-vessel coronary artery disease, unstable angina with positive stress test.  CLINICAL NOTE:  The patient is a morbidly obese, 70 year old, Caucasian male with a history of alcohol abuse and peripheral vascular disease status post a stent to the left subclavian artery last year.  He presents with episodes of dizziness and presyncope, chest discomfort and increasing dyspnea with exertion and decreasing exercise tolerance.  The symptoms have been accelerating in frequency and intensity.  He underwent a cardiac stress test which was positive for ischemia and subsequent cardiac catheterization by Dr. Irish Lack which demonstrated severe multivessel coronary disease.  He was admitted to the Cardiology service and placed on heparin.  He had mild-to-moderate LV dysfunction, inferior wall hypokinesia.  Mild MR and mild AI were noted on echo.  I discussed the results of cardiac catheterization and echo with the patient and the indications and expected benefits of multivessel CABG for treatment of severe multivessel CAD with unstable angina and mild-to- moderate LV dysfunction.  We discussed the benefits, alternatives, as well as the risks.  I  discussed the major aspects of the operation including the use of general anesthesia and cardiopulmonary bypass, the location of the surgical incisions, and the expected postoperative hospital recovery.  I discussed with the patient the risks to him of the operation as well.  I discussed that the procedure of CABG would be supplemented by a possible maze procedure for his history of atrial flutter which has been only intermittent.  I also discussed placing an atrial clip for prevention of thromboembolic stroke as the patient has not been on anticoagulation because of his history of alcohol abuse and falls.  I discussed with the patient the risks of CABG including the risks of stroke, MI, bleeding, blood transfusion requirement, postoperative infection, postoperative pulmonary problems including pleural effusion, and death.  He demonstrated his understanding and agrees to proceed with surgery under what I felt was an informed consent.  OPERATIVE FINDINGS: 1. Extreme difficulty with exposure of the heart due to the patient's     short stature and obesity and enlarged heart. 2. Intraoperative anemia requiring 2 units of packed cells. 3. Intraoperative coagulopathy after reversal of heparin with     protamine, probably from hepatic dysfunction from alcohol abuse     requiring platelets and FFP for thrombocytopenia as well.  OPERATIVE PROCEDURE:  The patient was brought to the operating room, placed supine on  the operating table, general anesthesia was induced under invasive hemodynamic monitoring.  The chest, abdomen, and legs were prepped with Betadine and draped as a sterile field.  A proper time- out was performed.  A sternal incision was made as the saphenous vein was harvested endoscopically from the right leg.  The left internal mammary artery was harvested as a pedicle graft from its origin at the subclavian vessels.  This was difficult due to the patient's short stature and morbid  obesity.  The sternal retractor was placed and the pericardium was opened and suspended.  The aorta was short, but without significant calcification. Pursestrings were placed in ascending aorta and right atrium and after heparin was administered, the patient was cannulated and placed on partial cardiopulmonary bypass.  The coronaries were identified for grafting and the mammary artery and vein grafts were prepared for the distal anastomoses.  Cardioplegia cannulas were placed for both antegrade and retrograde cold blood cardioplegia and the patient was cooled to 32 degrees.  The aortic crossclamp was applied.  One liter of cold blood cardioplegia was delivered in split doses between the antegrade aortic and retrograde coronary sinus catheters.  There was good cardioplegic arrest and septal temperature dropped less than 12 degrees.  Cardioplegia was delivered every 20 minutes while the crossclamp was in place.  The distal coronary anastomoses were performed.  The first distal anastomosis was to the posterior descending branch of right coronary. This had a proximal 80%-90% stenosis.  A reverse saphenous vein was sewn end-to-side with running 7-0 Prolene good flow through the graft. Cardioplegia was redosed.  The second distal anastomosis was to the OM branch of the occluded left circumflex.  This was a small 1.2-mm vessel but was graftable.  A reverse saphenous vein was sewn end-to-side with running 7-0 Prolene with good flow through the graft.  Cardioplegia was redosed.  The third distal anastomosis was to the diagonal branch to LAD.  This was a 1.5-mm vessel deep in the epicardial fat with a proximal ostial 90% stenosis.  A reverse saphenous vein was sewn end-to-side with running 7-0 Prolene with good flow through the graft.  Cardioplegia was redosed.  The fourth distal anastomosis was to the mid LAD which was 1.5-mm vessel with a proximal 90% stenosis.  A left internal mammary  artery pedicle graft brought through the left pericardium was brought down onto the LAD and sewn in end-to-side with running 8-0 Prolene.  There was good flow through the anastomosis after briefly releasing the bulldog pedicle clamp on the mammary.  After this was replaced, the pedicle was secured to the epicardium with 6-0 Prolenes.  Cardioplegia was then redosed.  With the crossclamp was still in place, 2 proximal vein anastomoses were performed on the ascending aorta with a 4.5 mm punch running 6-0 Prolene.  The proximal anastomosis of the vein graft to the circumflex was placed to the hood of the diagonal vein graft because of the short aorta and difficulties with having enough room for the 3rd proximal anastomosis. After the proximals were constructed, air was vented from the coronaries with a dose of retrograde warm blood cardioplegia.  The crossclamp was removed.  The heart resumed a spontaneous rhythm.  The vein grafts were de-aired and opened.  Each had good flow.  Hemostasis was documented at the proximal distal anastomoses.  The patient was rewarmed and reperfused. Temporary pacing wires were applied.  When the patient was adequately rewarmed and reperfused, the lungs were expanded, the ventilator was resumed.  The patient was then weaned from cardiopulmonary bypass on low- dose milrinone and dopamine.  Cardiac output and blood pressure were normal.  Echo showed preserved LV function.  Protamine was administered without adverse reaction.  The cannulas were then removed.  The patient still had significant coagulopathy after reversal of heparin with protamine.  The platelet count was 80,000 and the patient was given a unit of platelets and 2 FFPs.  This improved coagulation function.  The superior pericardial fat was closed over the aorta.  Anterior mediastinal, posterior mediastinal, and left pleural chest tubes were placed and brought out through separate incisions.  The  sternum was closed with wire.  The patient remained stable.  The pectoralis fascia was closed with a running #1 Vicryl.  The subcutaneous and skin layers were closed running Vicryl and sterile dressings were applied.  Total cardiopulmonary bypass time was 149 minutes.     Ivin Poot, M.D.     PV/MEDQ  D:  08/20/2015  T:  08/21/2015  Job:  [id number]  cc:   Jettie Booze, MD

## 2015-08-21 NOTE — Progress Notes (Addendum)
1 Day Post-Op Procedure(s) (LRB): CORONARY ARTERY BYPASS GRAFTING (CABG) TIMES FOUR  UTILIZING THE LEFT INTERNAL MAMMARY ARTERY AND ENDOSCOPICALLY HARVESTED RIGHT SAPHENEOUS VEINS. CLIPPING OF ATRIAL APPENDAGE (N/A) TRANSESOPHAGEAL ECHOCARDIOGRAM (TEE) (N/A) Subjective: Doing well after CABG 4-unstable angina severe three-vessel CAD Patient liberated from the ventilator at midnight, 4 L nasal cannula Preoperative history of atrial fib-flutter, history of alcohol abuse and inpatient detox admission Postoperative coagulopathy from probable liver insufficiency Morbidly obese, deconditioned  Objective: Vital signs in last 24 hours: Temp:  [97.2 F (36.2 C)-98.2 F (36.8 C)] 97.5 F (36.4 C) (05/06 0700) Pulse Rate:  [72-98] 90 (05/05 1952) Cardiac Rhythm:  [-] Normal sinus rhythm (05/06 0400) Resp:  [7-29] 17 (05/06 0700) BP: (87-151)/(52-75) 107/58 mmHg (05/06 0700) SpO2:  [90 %-100 %] 91 % (05/06 0700) Arterial Line BP: (90-130)/(44-55) 126/50 mmHg (05/06 0700) FiO2 (%):  [40 %-60 %] 50 % (05/06 0400) Weight:  [219 lb (99.338 kg)] 219 lb (99.338 kg) (05/06 0500)  Hemodynamic parameters for last 24 hours: PAP: (17-42)/(9-23) 33/18 mmHg CO:  [5 L/min-5.9 L/min] 5.3 L/min CI:  [2.5 L/min/m2-3 L/min/m2] 2.9 L/min/m2  Intake/Output from previous day: 05/05 0701 - 05/06 0700 In: 7151.1 [I.V.:4480.1; Blood:1871; IV Piggyback:800] Out: 5260 [Urine:2230; Blood:2300; Chest Tube:730] Intake/Output this shift: Total I/O In: 3 [I.V.:3] Out: -        Exam    General- alert and comfortable   Lungs- clear without rales, wheezes   Cor- regular rate and rhythm, no murmur , gallop   Abdomen- soft, non-tender   Extremities - warm, non-tender, minimal edema   Neuro- oriented, appropriate, no focal weakness   Lab Results:  Recent Labs  08/20/15 1800 08/21/15 0512  WBC 11.6* 13.0*  HGB 10.3* 9.1*  HCT 30.4* 26.2*  PLT 85* 91*   BMET:  Recent Labs  08/20/15 0245  08/20/15 1607  08/20/15 1752 08/21/15 0512  NA 135  < > 135 136 136  K 4.2  < > 3.9 3.7 3.9  CL 102  < > 101  --  105  CO2 23  --   --   --  22  GLUCOSE 115*  < > 177* 153* 115*  BUN 14  < > 9  --  7  CREATININE 1.32*  < > 0.80  --  1.01  CALCIUM 8.9  --   --   --  7.8*  < > = values in this interval not displayed.  PT/INR:  Recent Labs  08/20/15 1800  LABPROT 19.7*  INR 1.67*   ABG    Component Value Date/Time   PHART 7.312* 08/21/2015 0109   HCO3 21.2 08/21/2015 0109   TCO2 23 08/21/2015 0109   ACIDBASEDEF 5.0* 08/21/2015 0109   O2SAT 88.0 08/21/2015 0109   CBG (last 3)   Recent Labs  08/21/15 0358 08/21/15 0459 08/21/15 0556  GLUCAP 114* 117* 112*    Assessment/Plan: S/P Procedure(s) (LRB): CORONARY ARTERY BYPASS GRAFTING (CABG) TIMES FOUR  UTILIZING THE LEFT INTERNAL MAMMARY ARTERY AND ENDOSCOPICALLY HARVESTED RIGHT SAPHENEOUS VEINS. CLIPPING OF ATRIAL APPENDAGE (N/A) TRANSESOPHAGEAL ECHOCARDIOGRAM (TEE) (N/A) Mobilize Diuresis Diabetes control d/c tubes/lines See progression orders Nightly Valium dose, IV thiamine, 2 beers daily ordered   LOS: 2 days    Victor Diaz 08/21/2015

## 2015-08-22 ENCOUNTER — Inpatient Hospital Stay (HOSPITAL_COMMUNITY): Payer: Medicare Other

## 2015-08-22 LAB — BASIC METABOLIC PANEL
Anion gap: 7 (ref 5–15)
BUN: 6 mg/dL (ref 6–20)
CO2: 25 mmol/L (ref 22–32)
Calcium: 7.9 mg/dL — ABNORMAL LOW (ref 8.9–10.3)
Chloride: 98 mmol/L — ABNORMAL LOW (ref 101–111)
Creatinine, Ser: 1 mg/dL (ref 0.61–1.24)
GFR calc Af Amer: >60 mL/min (ref >60)
GFR calc non Af Amer: >60 mL/min (ref >60)
Glucose, Bld: 154 mg/dL — ABNORMAL HIGH (ref 65–99)
Potassium: 4.3 mmol/L (ref 3.5–5.1)
Sodium: 130 mmol/L — ABNORMAL LOW (ref 135–145)

## 2015-08-22 LAB — CBC
HCT: 25.2 % — ABNORMAL LOW (ref 39.0–52.0)
Hemoglobin: 8.5 g/dL — ABNORMAL LOW (ref 13.0–17.0)
MCH: 32.9 pg (ref 26.0–34.0)
MCHC: 33.7 g/dL (ref 30.0–36.0)
MCV: 97.7 fL (ref 78.0–100.0)
Platelets: 76 10*3/uL — ABNORMAL LOW (ref 150–400)
RBC: 2.58 MIL/uL — ABNORMAL LOW (ref 4.22–5.81)
RDW: 15 % (ref 11.5–15.5)
WBC: 12.1 10*3/uL — ABNORMAL HIGH (ref 4.0–10.5)

## 2015-08-22 LAB — POCT I-STAT, CHEM 8
BUN: 9 mg/dL (ref 6–20)
CHLORIDE: 92 mmol/L — AB (ref 101–111)
CREATININE: 0.9 mg/dL (ref 0.61–1.24)
Calcium, Ion: 1.15 mmol/L (ref 1.13–1.30)
Glucose, Bld: 133 mg/dL — ABNORMAL HIGH (ref 65–99)
HEMATOCRIT: 27 % — AB (ref 39.0–52.0)
Hemoglobin: 9.2 g/dL — ABNORMAL LOW (ref 13.0–17.0)
Potassium: 4.2 mmol/L (ref 3.5–5.1)
Sodium: 130 mmol/L — ABNORMAL LOW (ref 135–145)
TCO2: 25 mmol/L (ref 0–100)

## 2015-08-22 LAB — GLUCOSE, CAPILLARY
GLUCOSE-CAPILLARY: 169 mg/dL — AB (ref 65–99)
Glucose-Capillary: 136 mg/dL — ABNORMAL HIGH (ref 65–99)
Glucose-Capillary: 128 mg/dL — ABNORMAL HIGH (ref 65–99)
Glucose-Capillary: 132 mg/dL — ABNORMAL HIGH (ref 65–99)
Glucose-Capillary: 126 mg/dL — ABNORMAL HIGH (ref 65–99)

## 2015-08-22 MED ORDER — ALBUMIN HUMAN 25 % IV SOLN
12.5000 g | Freq: Four times a day (QID) | INTRAVENOUS | Status: AC
  Administered 2015-08-22 (×3): 12.5 g via INTRAVENOUS
  Filled 2015-08-22 (×3): qty 50

## 2015-08-22 MED ORDER — AMIODARONE IV BOLUS ONLY 150 MG/100ML
150.0000 mg | Freq: Once | INTRAVENOUS | Status: AC
  Administered 2015-08-22: 150 mg via INTRAVENOUS

## 2015-08-22 MED ORDER — METOCLOPRAMIDE HCL 5 MG/ML IJ SOLN
10.0000 mg | Freq: Four times a day (QID) | INTRAMUSCULAR | Status: DC
  Administered 2015-08-22 – 2015-09-09 (×69): 10 mg via INTRAVENOUS
  Filled 2015-08-22 (×68): qty 2

## 2015-08-22 NOTE — Progress Notes (Signed)
CT surgery p.m. Rounds  Patient on IV amiodarone-developed atrial fibrillation today We'll treat with another bolus 150 IV through central line Patient walking hallway with PT Abdomen distended with probable ileus-resume Reglan Adequate blood pressure and urine output

## 2015-08-22 NOTE — Progress Notes (Signed)
2 Days Post-Op Procedure(s) (LRB): CORONARY ARTERY BYPASS GRAFTING (CABG) TIMES FOUR  UTILIZING THE LEFT INTERNAL MAMMARY ARTERY AND ENDOSCOPICALLY HARVESTED RIGHT SAPHENEOUS VEINS. CLIPPING OF ATRIAL APPENDAGE (N/A) TRANSESOPHAGEAL ECHOCARDIOGRAM (TEE) (N/A) Subjective: Patient feels somewhat weaker today Remains in sinus rhythm stable blood pressure afebrile Out of bed to chair with short walk with physical therapy Chest x-ray with low lung volumes No evidence of alcohol withdrawal-DTs Objective: Vital signs in last 24 hours: Temp:  [97.7 F (36.5 C)-98.2 F (36.8 C)] 97.8 F (36.6 C) (05/07 0750) Pulse Rate:  [98] 98 (05/07 0800) Cardiac Rhythm:  [-] Atrial fibrillation (05/07 1000) Resp:  [0-39] 26 (05/07 1000) BP: (83-123)/(37-93) 112/73 mmHg (05/07 1000) SpO2:  [95 %-99 %] 98 % (05/07 1000) Arterial Line BP: (72-141)/(41-56) 141/54 mmHg (05/06 1800) Weight:  [220 lb 7.4 oz (100 kg)] 220 lb 7.4 oz (100 kg) (05/07 0500)  Hemodynamic parameters for last 24 hours:  stable  Intake/Output from previous day: 05/06 0701 - 05/07 0700 In: 2041.3 [P.O.:240; I.V.:1501.3; IV Piggyback:300] Out: 1855 [Urine:1425; Chest Tube:430] Intake/Output this shift: Total I/O In: 185.6 [I.V.:135.6; IV Piggyback:50] Out: 120 [Urine:40; Chest Tube:80]       Exam    General- alert and comfortable   Lungs- clear without rales, wheezes   Cor- regular rate and rhythm, no murmur , gallop   Abdomen- soft, non-tender   Extremities - warm, non-tender, minimal edema   Neuro- oriented, appropriate, no focal weakness   Lab Results:  Recent Labs  08/21/15 1620 08/22/15 0401  WBC 12.5* 12.1*  HGB 8.7* 8.5*  HCT 25.4* 25.2*  PLT 75* 76*   BMET:  Recent Labs  08/21/15 0512 08/21/15 1616 08/21/15 1620 08/22/15 0401  NA 136 136  --  130*  K 3.9 3.8  --  4.3  CL 105 101  --  98*  CO2 22  --   --  25  GLUCOSE 115* 134*  --  154*  BUN 7 6  --  6  CREATININE 1.01 0.90 1.07 1.00  CALCIUM  7.8*  --   --  7.9*    PT/INR:  Recent Labs  08/20/15 1800  LABPROT 19.7*  INR 1.67*   ABG    Component Value Date/Time   PHART 7.312* 08/21/2015 0109   HCO3 21.2 08/21/2015 0109   TCO2 21 08/21/2015 1616   ACIDBASEDEF 5.0* 08/21/2015 0109   O2SAT 88.0 08/21/2015 0109   CBG (last 3)   Recent Labs  08/21/15 2333 08/22/15 0335 08/22/15 0737  GLUCAP 169* 136* 128*    Assessment/Plan: S/P Procedure(s) (LRB): CORONARY ARTERY BYPASS GRAFTING (CABG) TIMES FOUR  UTILIZING THE LEFT INTERNAL MAMMARY ARTERY AND ENDOSCOPICALLY HARVESTED RIGHT SAPHENEOUS VEINS. CLIPPING OF ATRIAL APPENDAGE (N/A) TRANSESOPHAGEAL ECHOCARDIOGRAM (TEE) (N/A) Mobilize Diuresis Continue IV diuresis   LOS: 3 days    Victor Diaz 08/22/2015

## 2015-08-22 NOTE — Progress Notes (Signed)
Inpatient Rehabilitation  Per PT request patient was screened by Viviene Thurston for appropriateness for an Inpatient Acute Rehab consult.  At this time we are recommending an Inpatient Rehab consult.  Please order if you are agreeable.    Mackenna Kamer, M.A., CCC/SLP Admission Coordinator  [facility]  Cell [phone]  

## 2015-08-22 NOTE — Evaluation (Signed)
Physical Therapy Evaluation Patient Details Name: Victor Diaz MRN: [id number] DOB: 03-Jun-1945 Today's Date: 08/22/2015   History of Present Illness  pt presents with CABG x4.  pt with hx of Etoh, L THA, Gout, HTN, and A-Flutter.    Clinical Impression  Pt anxious about mobility and tremulous throughout mobility.  Pt has family that live with him, but unclear if they are able to provide much A.  RN present to A with management of equipment.  Feel pt would benefit from CIR level of therapy to maximize independence prior to returning to home.  Will continue to follow.      Follow Up Recommendations CIR    Equipment Recommendations  None recommended by PT    Recommendations for Other Services Rehab consult     Precautions / Restrictions Precautions Precautions: Fall;Sternal Precaution Comments: Chest tubes, [place] Councilman Restrictions Weight Bearing Restrictions: No      Mobility  Bed Mobility               General bed mobility comments: pt sitting in recliner.  Transfers Overall transfer level: Needs assistance Equipment used: Rolling walker (2 wheeled) Transfers: Sit to/from Stand Sit to Stand: Min assist;+2 physical assistance         General transfer comment: cues for UEs to knees and using momentum to come to standing.    Ambulation/Gait Ambulation/Gait assistance: Min assist;+2 safety/equipment Ambulation Distance (Feet): 60 Feet Assistive device: Rolling walker (2 wheeled) Gait Pattern/deviations: Step-through pattern;Decreased stride length     General Gait Details: pt tremulous throughout mobility and pt indicates he did this with OOB to chair this am.  pt needs cues for slow pursed lip breathing and encouragement.  2nd person present to A with management of equipment and chair follow.    Stairs            Wheelchair Mobility    Modified Rankin (Stroke Patients Only)       Balance Overall balance assessment: Needs  assistance Sitting-balance support: No upper extremity supported;Feet supported Sitting balance-Leahy Scale: Fair     Standing balance support: Bilateral upper extremity supported;During functional activity Standing balance-Leahy Scale: Poor                               Pertinent Vitals/Pain Pain Assessment: Faces Faces Pain Scale: Hurts little more Pain Location: pt grimacing, but indicates having anxiety. Pain Descriptors / Indicators: Grimacing Pain Intervention(s): Monitored during session;Premedicated before session;Repositioned    Home Living Family/patient expects to be discharged to:: Private residence Living Arrangements: Children Available Help at Discharge: Family;Available 24 hours/day Type of Home: House Home Access: Stairs to enter Entrance Stairs-Rails: None Entrance Stairs-Number of Steps: 2 Home Layout: Laundry or work area in basement;Able to live on main level with bedroom/bathroom Home Equipment: Environmental consultant - 2 wheels;Bedside commode;Cane - single point      Prior Function Level of Independence: Independent               Hand Dominance        Extremity/Trunk Assessment   Upper Extremity Assessment: Defer to OT evaluation           Lower Extremity Assessment: Generalized weakness      Cervical / Trunk Assessment: Normal  Communication   Communication: No difficulties  Cognition Arousal/Alertness: Awake/alert Behavior During Therapy: WFL for tasks assessed/performed Overall Cognitive Status: Within Functional Limits for tasks assessed  General Comments      Exercises        Assessment/Plan    PT Assessment Patient needs continued PT services  PT Diagnosis Difficulty walking;Generalized weakness   PT Problem List Decreased strength;Decreased activity tolerance;Decreased balance;Decreased mobility;Decreased coordination;Decreased knowledge of use of DME;Decreased knowledge of  precautions;Cardiopulmonary status limiting activity;Pain  PT Treatment Interventions DME instruction;Gait training;Stair training;Functional mobility training;Therapeutic activities;Therapeutic exercise;Balance training;Patient/family education   PT Goals (Current goals can be found in the Care Plan section) Acute Rehab PT Goals Patient Stated Goal: Walk PT Goal Formulation: With patient Time For Goal Achievement: 09/05/15 Potential to Achieve Goals: Good    Frequency Min 3X/week   Barriers to discharge Decreased caregiver support pt's children live with him, but unclear how much A they are able to help.      Co-evaluation               End of Session Equipment Utilized During Treatment: Gait belt;Oxygen Activity Tolerance: Patient limited by fatigue Patient left: in chair;with nursing/sitter in room Nurse Communication: Mobility status         Time: 0912-0938 PT Time Calculation (min) (ACUTE ONLY): 26 min   Charges:   PT Evaluation $PT Eval Moderate Complexity: 1 Procedure PT Treatments $Gait Training: 8-22 mins   PT G CodesCatarina Hartshorn, [place] 08/22/2015, 9:57 AM

## 2015-08-23 ENCOUNTER — Inpatient Hospital Stay (HOSPITAL_COMMUNITY): Payer: Medicare Other

## 2015-08-23 ENCOUNTER — Encounter (HOSPITAL_COMMUNITY): Payer: Self-pay | Admitting: Cardiothoracic Surgery

## 2015-08-23 LAB — TYPE AND SCREEN
ABO/RH(D): A POS
Antibody Screen: NEGATIVE
Unit division: 0
Unit division: 0
Unit division: 0
Unit division: 0
Unit division: 0
Unit division: 0

## 2015-08-23 LAB — CBC
HCT: 24 % — ABNORMAL LOW (ref 39.0–52.0)
HEMOGLOBIN: 8 g/dL — AB (ref 13.0–17.0)
MCH: 32.7 pg (ref 26.0–34.0)
MCHC: 33.3 g/dL (ref 30.0–36.0)
MCV: 98 fL (ref 78.0–100.0)
Platelets: 89 10*3/uL — ABNORMAL LOW (ref 150–400)
RBC: 2.45 MIL/uL — AB (ref 4.22–5.81)
RDW: 14.6 % (ref 11.5–15.5)
WBC: 10.4 10*3/uL (ref 4.0–10.5)

## 2015-08-23 LAB — GLUCOSE, CAPILLARY
GLUCOSE-CAPILLARY: 131 mg/dL — AB (ref 65–99)
GLUCOSE-CAPILLARY: 109 mg/dL — AB (ref 65–99)
GLUCOSE-CAPILLARY: 98 mg/dL (ref 65–99)
GLUCOSE-CAPILLARY: 145 mg/dL — AB (ref 65–99)
GLUCOSE-CAPILLARY: 124 mg/dL — AB (ref 65–99)
Glucose-Capillary: 111 mg/dL — ABNORMAL HIGH (ref 65–99)
Glucose-Capillary: 149 mg/dL — ABNORMAL HIGH (ref 65–99)

## 2015-08-23 LAB — COMPREHENSIVE METABOLIC PANEL
ALBUMIN: 2.9 g/dL — AB (ref 3.5–5.0)
ALK PHOS: 28 U/L — AB (ref 38–126)
ALT: 20 U/L (ref 17–63)
ANION GAP: 8 (ref 5–15)
AST: 35 U/L (ref 15–41)
BUN: 8 mg/dL (ref 6–20)
CALCIUM: 7.8 mg/dL — AB (ref 8.9–10.3)
CHLORIDE: 91 mmol/L — AB (ref 101–111)
CO2: 24 mmol/L (ref 22–32)
Creatinine, Ser: 0.97 mg/dL (ref 0.61–1.24)
GFR calc non Af Amer: >60 mL/min (ref >60)
GLUCOSE: 136 mg/dL — AB (ref 65–99)
Potassium: 3.9 mmol/L (ref 3.5–5.1)
SODIUM: 123 mmol/L — AB (ref 135–145)
Total Bilirubin: 0.9 mg/dL (ref 0.3–1.2)
Total Protein: 5.3 g/dL — ABNORMAL LOW (ref 6.5–8.1)

## 2015-08-23 LAB — POCT I-STAT, CHEM 8
BUN: 11 mg/dL (ref 6–20)
CALCIUM ION: 1.11 mmol/L — AB (ref 1.13–1.30)
Chloride: 88 mmol/L — ABNORMAL LOW (ref 101–111)
Creatinine, Ser: 0.9 mg/dL (ref 0.61–1.24)
GLUCOSE: 107 mg/dL — AB (ref 65–99)
HCT: 27 % — ABNORMAL LOW (ref 39.0–52.0)
HEMOGLOBIN: 9.2 g/dL — AB (ref 13.0–17.0)
POTASSIUM: 4.5 mmol/L (ref 3.5–5.1)
Sodium: 124 mmol/L — ABNORMAL LOW (ref 135–145)
TCO2: 23 mmol/L (ref 0–100)

## 2015-08-23 MED ORDER — INSULIN ASPART 100 UNIT/ML ~~LOC~~ SOLN
0.0000 [IU] | Freq: Three times a day (TID) | SUBCUTANEOUS | Status: DC
  Administered 2015-08-23 – 2015-08-24 (×2): 3 [IU] via SUBCUTANEOUS

## 2015-08-23 MED ORDER — SODIUM CHLORIDE 0.9% FLUSH
10.0000 mL | Freq: Two times a day (BID) | INTRAVENOUS | Status: DC
Start: 2015-08-23 — End: 2015-09-09
  Administered 2015-08-23: 10 mL
  Administered 2015-08-23: 40 mL
  Administered 2015-08-24 – 2015-08-30 (×9): 10 mL
  Administered 2015-08-31 – 2015-09-01 (×2): 20 mL
  Administered 2015-09-03: 10 mL

## 2015-08-23 MED ORDER — POTASSIUM CHLORIDE CRYS ER 20 MEQ PO TBCR
20.0000 meq | EXTENDED_RELEASE_TABLET | Freq: Every day | ORAL | Status: DC
  Administered 2015-08-23 – 2015-09-08 (×9): 20 meq via ORAL
  Filled 2015-08-23 (×12): qty 1

## 2015-08-23 MED ORDER — FUROSEMIDE 10 MG/ML IJ SOLN
40.0000 mg | Freq: Two times a day (BID) | INTRAMUSCULAR | Status: DC
  Administered 2015-08-23 – 2015-08-25 (×5): 40 mg via INTRAVENOUS
  Filled 2015-08-23 (×5): qty 4

## 2015-08-23 MED ORDER — POTASSIUM CHLORIDE 10 MEQ/50ML IV SOLN
10.0000 meq | INTRAVENOUS | Status: AC
  Administered 2015-08-23 (×2): 10 meq via INTRAVENOUS
  Filled 2015-08-23 (×2): qty 50

## 2015-08-23 MED ORDER — ENOXAPARIN SODIUM 40 MG/0.4ML ~~LOC~~ SOLN
40.0000 mg | SUBCUTANEOUS | Status: DC
  Administered 2015-08-23 – 2015-09-09 (×17): 40 mg via SUBCUTANEOUS
  Filled 2015-08-23 (×17): qty 0.4

## 2015-08-23 MED ORDER — BUDESONIDE 0.5 MG/2ML IN SUSP
0.5000 mg | Freq: Two times a day (BID) | RESPIRATORY_TRACT | Status: DC
  Administered 2015-08-23 – 2015-09-09 (×31): 0.5 mg via RESPIRATORY_TRACT
  Filled 2015-08-23 (×33): qty 2

## 2015-08-23 MED ORDER — VITAMIN B-1 100 MG PO TABS
100.0000 mg | ORAL_TABLET | Freq: Every day | ORAL | Status: DC
  Administered 2015-08-24: 100 mg via ORAL
  Filled 2015-08-23: qty 1

## 2015-08-23 MED ORDER — INSULIN ASPART 100 UNIT/ML ~~LOC~~ SOLN: 0.0000 [IU] | Freq: Every day | SUBCUTANEOUS | Status: DC

## 2015-08-23 MED ORDER — BISACODYL 10 MG RE SUPP: 10.0000 mg | Freq: Once | RECTAL | Status: DC

## 2015-08-23 MED ORDER — AMIODARONE HCL 200 MG PO TABS
400.0000 mg | ORAL_TABLET | Freq: Two times a day (BID) | ORAL | Status: DC
  Administered 2015-08-23: 400 mg via ORAL
  Filled 2015-08-23: qty 2

## 2015-08-23 MED ORDER — SODIUM CHLORIDE 0.9% FLUSH
10.0000 mL | INTRAVENOUS | Status: DC | PRN
  Administered 2015-09-05: 20 mL
  Administered 2015-09-06: 40 mL
  Administered 2015-09-07 – 2015-09-09 (×4): 10 mL
  Filled 2015-08-23 (×6): qty 40

## 2015-08-23 MED FILL — Lidocaine HCl IV Inj 20 MG/ML: INTRAVENOUS | Qty: 5 | Status: AC

## 2015-08-23 MED FILL — Sodium Chloride IV Soln 0.9%: INTRAVENOUS | Qty: 2000 | Status: AC

## 2015-08-23 MED FILL — Albumin, Human Inj 5%: INTRAVENOUS | Qty: 500 | Status: AC

## 2015-08-23 MED FILL — Electrolyte-R (PH 7.4) Solution: INTRAVENOUS | Qty: 7000 | Status: AC

## 2015-08-23 MED FILL — Magnesium Sulfate Inj 50%: INTRAMUSCULAR | Qty: 10 | Status: AC

## 2015-08-23 MED FILL — Heparin Sodium (Porcine) Inj 1000 Unit/ML: INTRAMUSCULAR | Qty: 30 | Status: AC

## 2015-08-23 MED FILL — Calcium Chloride Inj 10%: INTRAVENOUS | Qty: 10 | Status: AC

## 2015-08-23 MED FILL — Potassium Chloride Inj 2 mEq/ML: INTRAVENOUS | Qty: 40 | Status: AC

## 2015-08-23 MED FILL — Heparin Sodium (Porcine) Inj 1000 Unit/ML: INTRAMUSCULAR | Qty: 10 | Status: AC

## 2015-08-23 MED FILL — Mannitol IV Soln 20%: INTRAVENOUS | Qty: 500 | Status: AC

## 2015-08-23 MED FILL — Sodium Bicarbonate IV Soln 8.4%: INTRAVENOUS | Qty: 50 | Status: AC

## 2015-08-23 NOTE — Clinical Social Work Note (Signed)
Clinical Social Work Assessment  Patient Details  Name: Victor Diaz MRN: [id number] Date of Birth: June 10, 1945  Date of referral:  08/23/15               Reason for consult:  Discharge Planning                Permission sought to share information with:  Chartered certified accountant granted to share information::  Yes, Verbal Permission Granted  Name::     Victor Diaz  Agency::  SNFs  Relationship::  Son  Contact Information:     Housing/Transportation Living arrangements for the past 2 months:  [place] of Information:  Patient Patient Interpreter Needed:  None Criminal Activity/Legal Involvement Pertinent to Current Situation/Hospitalization:  No - Comment as needed Significant Relationships:  Adult Children Lives with:  Self, Adult Children Do you feel safe going back to the place where you live?  Yes Need for family participation in patient care:  No (Coment)  Care giving concerns:  The patient is aggregable for short term rehab at discharge. Patient would like to rebuild his strength to return home.    Social Worker assessment / plan: CSW met with patient at beside to complete assessment. Patient was resting comfortably in bed.  CSW explained PT recommendation for CIR placement. CSW explained that an alternate plan of SNFs placement would possibly be needed.  CSW explained SNF search and placement process to the patient and answered his questions. The patient reported his support as his son Victor Diaz. CSW called patient's son and explained PT recommendation for CIR placement. CSW explained that an alternate plan of SNFs placement would possibly be needed.  CSW explained SNF search and placement process to the patient's son and answered his questions.  The patient and patient's son requested the CSW to pursue alternate plan of SNFs placement. CSW will follow up with bed offers.  Employment status:  Retired Health visitor PT  Recommendations:  Inpatient [place] / Referral to community resources:  [place]  Patient/Family's Response to care:  The patient appears happy with the care she is receiving in hospital and is appreciative of CSW assistance.  Patient/Family's Understanding of and Emotional Response to Diagnosis, Current Treatment, and Prognosis: The patient has a good understanding of why he was admitted. He/ she understands the care plan and what he will need post discharge.  Emotional Assessment Appearance:  Appears stated age Attitude/Demeanor/Rapport:   (Patient was welcoming of CSW and appropriate. ) Affect (typically observed):  Accepting, Calm, Appropriate Orientation:  Oriented to Self, Oriented to Place, Oriented to  Time, Oriented to Situation Alcohol / Substance use:  Not Applicable Psych involvement (Current and /or in the community):  No (Comment)  Discharge Needs  Concerns to be addressed:  Discharge Planning Concerns Readmission within the last 30 days:  No Current discharge risk:  Physical Impairment Barriers to Discharge:  Continued Medical Work up   [employer], LCSW [phone]  08/23/2015, 4:07 PM

## 2015-08-23 NOTE — Research (Signed)
[facility] RESEARCH STUDY Informed Consent   Subject Name: Victor Diaz  Subject met inclusion and exclusion criteria.  The informed consent form, study requirements and expectations were reviewed with the subject and questions and concerns were addressed prior to the signing of the consent form.  The subject verbalized understanding of the trial requirements.  The subject agreed to participate in the [place] trial and signed the informed consent.  The informed consent was obtained prior to performance of any protocol-specific procedures for the subject.  A copy of the signed informed consent was given to the subject and a copy was placed in the subject's medical record.  Desmond Dike H 08/23/2015, 07:00 AM

## 2015-08-23 NOTE — Progress Notes (Signed)
Peripherally Inserted Central Catheter/Midline Placement  The IV Nurse has discussed with the patient and/or persons authorized to consent for the patient, the purpose of this procedure and the potential benefits and risks involved with this procedure.  The benefits include less needle sticks, lab draws from the catheter and patient may be discharged home with the catheter.  Risks include, but not limited to, infection, bleeding, blood clot (thrombus formation), and puncture of an artery; nerve damage and irregular heat beat.  Alternatives to this procedure were also discussed.  PICC/Midline Placement Documentation  PICC Double Lumen 08/23/15 PICC Right Brachial 39 cm (Active)       Jule Economy Horton 08/23/2015, 2:41 PM

## 2015-08-23 NOTE — Plan of Care (Signed)
Problem: Activity: Goal: Risk for activity intolerance will decrease Outcome: Progressing Progressing slowly. Patient only walked 50 feet today and sat in chair 1.5 hours POD #3. PT consulted previously ordered.  Problem: Nutritional: Goal: Risk for body nutrition deficit will decrease Outcome: Not Progressing Patient with very little appetite ate less than 25% of dinner, no complaints of nausea at this time.

## 2015-08-23 NOTE — Progress Notes (Signed)
Patient ambulated in hallway 50 ft with wheelchair, 2 assist and gait belt, tolerated walk fair. Patient back in bed, SCDs on left leg, call bell within reach, visitor at bedside, will continue to monitor.  Rowe Pavy, RN

## 2015-08-23 NOTE — NC FL2 (Signed)
Victor Diaz LEVEL OF CARE SCREENING TOOL     IDENTIFICATION  [name] Birthdate: 1945/06/19 Sex: male Admission Date (Current Location): 08/19/2015  [facility] [place] Number:  Herbalist and Address:  [facility], [place] [address], [place], [place] [address]      Provider Number: [id number]  Attending Physician Name and Address:  Ivin Poot, MD  Relative Name and Phone Number:       Current Level of Care: Hospital Recommended Level of Care: [place] Prior Approval Number:    Date Approved/Denied:   PASRR Number: [id number]  Discharge Plan: SNF    Current Diagnoses: Patient Active Problem List   Diagnosis Date Noted  . 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 Unstable angina ([place]) 08/19/2015  . Abnormal myocardial perfusion study   . Hyperlipidemia 01/30/2015  . Faintness   . 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 Thrombocytopenia ([place]) 10/16/2014  . Atrial flutter ([place]) 10/16/2014  . Elevated LFTs 10/16/2014  . Subclavian steal syndrome 10/16/2014  . Subclavian artery stenosis, left 10/16/2014  . Diastolic dysfunction [id number]  . Carotid stenosis 10/16/2014  . Bruit of left carotid artery   . Dehydration   . Syncope 10/14/2014  . ETOH abuse 10/14/2014  . Hyponatremia 10/14/2014  . Metabolic acidosis [id number]  . Chest pain 10/14/2014  . Laceration of head 10/14/2014  . Malnutrition ([place]) 10/14/2014  . Forehead laceration   . Adjustment disorder with mixed anxiety and depressed mood 03/17/2013  . Aortic stenosis 08/03/2011  . Cerebrovascular disease 08/03/2011  . ASCVD (arteriosclerotic cardiovascular disease) 08/03/2011  . Bladder neck obstruction 08/18/2010  . PLEURAL EFFUSION, RIGHT 04/07/2010  . GERD 05/14/2009  . HLD (hyperlipidemia) 12/11/2006  . GOUT 12/11/2006  . DEPENDENCE, ALCOHOL NEC/NOS, UNSPECIFIED 12/11/2006  . ALLERGIC RHINITIS 12/11/2006  . LIVER  FUNCTION TESTS, ABNORMAL 12/11/2006    Orientation RESPIRATION BLADDER Height & Weight     Self, Time, Situation, Place  O2 (2L/min) Indwelling catheter Weight: 223 lb 5.2 oz (101.3 kg) Height:  5\' 6"  (167.6 cm)  BEHAVIORAL SYMPTOMS/MOOD NEUROLOGICAL BOWEL NUTRITION STATUS   (None)  (None) Continent Diet (Full Liquid )  AMBULATORY STATUS COMMUNICATION OF NEEDS Skin   Extensive Assist Verbally Surgical wounds (Incision Closed: Chest, RT Leg)                       Personal Care Assistance Level of Assistance  Bathing, Feeding, Dressing Bathing Assistance: Limited assistance Feeding assistance: Limited assistance Dressing Assistance: Limited assistance     Functional Limitations Info  Sight, Hearing, Speech Sight Info: Adequate Hearing Info: Adequate Speech Info: Adequate    SPECIAL CARE FACTORS FREQUENCY  PT (By licensed PT), OT (By licensed OT)     PT Frequency: 5/ week OT Frequency: 5/ week            Contractures Contractures Info: Not present    Additional Factors Info  Code Status, Allergies, Insulin Sliding Scale Code Status Info: Full Allergies Info: Atorvastatin   Insulin Sliding Scale Info: Novolog 0- 20 units 3 times a day. Novolog 0-5 units at bed time       Current Medications (08/23/2015):  This is the current hospital active medication list Current Facility-Administered Medications  Medication Dose Route Frequency Provider Last Rate Last Dose  . 0.45 % sodium chloride infusion   Intravenous Continuous PRN John Giovanni, PA-C   Stopped at 08/21/15 2000  .  0.9 %  sodium chloride infusion  250 mL Intravenous Continuous Wayne E Gold, PA-C      . 0.9 %  sodium chloride infusion   Intravenous Continuous Wayne E Gold, PA-C 20 mL/hr at 08/22/15 0800    . acetaminophen (TYLENOL) tablet 1,000 mg  1,000 mg Oral Q6H Wayne E Gold, PA-C   1,000 mg at 08/22/15 1713   Or  . acetaminophen (TYLENOL) solution 1,000 mg  1,000 mg Per Tube Q6H Wayne E Gold, PA-C    1,000 mg at 08/23/15 [id number]  . ALPRAZolam Duanne Moron) tablet 0.5 mg  0.5 mg Oral BID PRN Ivin Poot, MD   0.5 mg at 08/22/15 2154  . antiseptic oral rinse (CPC / CETYLPYRIDINIUM CHLORIDE 0.05%) solution 7 mL  7 mL Mouth Rinse BID Ivin Poot, MD   7 mL at 08/23/15 1000  . aspirin EC tablet 81 mg  81 mg Oral Daily Ivin Poot, MD   81 mg at 08/23/15 0908  . bisacodyl (DULCOLAX) EC tablet 10 mg  10 mg Oral Daily John Giovanni, PA-C   10 mg at 08/23/15 [id number]   Or  . bisacodyl (DULCOLAX) suppository 10 mg  10 mg Rectal Daily Wayne E Gold, PA-C      . bisacodyl (DULCOLAX) suppository 10 mg  10 mg Rectal Once Ivin Poot, MD   10 mg at 08/23/15 1345  . budesonide (PULMICORT) nebulizer solution 0.5 mg  0.5 mg Nebulization BID Ivin Poot, MD      . docusate sodium (COLACE) capsule 200 mg  200 mg Oral Daily John Giovanni, PA-C   200 mg at 08/23/15 0908  . enoxaparin (LOVENOX) injection 40 mg  40 mg Subcutaneous Q24H Ivin Poot, MD   40 mg at 08/23/15 0846  . furosemide (LASIX) injection 40 mg  40 mg Intravenous BID Ivin Poot, MD      . insulin aspart (novoLOG) injection 0-20 Units  0-20 Units Subcutaneous TID WC Ivin Poot, MD   3 Units at 08/23/15 0845  . insulin aspart (novoLOG) injection 0-5 Units  0-5 Units Subcutaneous QHS Ivin Poot, MD      . insulin detemir (LEVEMIR) injection 12 Units  12 Units Subcutaneous BID Ivin Poot, MD   12 Units at 08/23/15 [phone]  . lactated ringers infusion   Intravenous Continuous John Giovanni, PA-C   Stopped at 08/21/15 2000  . metoCLOPramide (REGLAN) injection 10 mg  10 mg Intravenous Q6H Ivin Poot, MD   10 mg at 08/23/15 1231  . metoprolol (LOPRESSOR) injection 2.5-5 mg  2.5-5 mg Intravenous Q2H PRN John Giovanni, PA-C   5 mg at 08/22/15 [id number]  . metoprolol tartrate (LOPRESSOR) tablet 12.5 mg  12.5 mg Oral BID John Giovanni, PA-C   12.5 mg at 08/23/15 [id number]   Or  . metoprolol tartrate (LOPRESSOR) 25 mg/10 mL oral suspension  12.5 mg  12.5 mg Per Tube BID Wayne E Gold, PA-C      . ondansetron [facility]) injection 4 mg  4 mg Intravenous Q6H PRN Wayne E Gold, PA-C   4 mg at 08/23/15 1102  . oxyCODONE (Oxy IR/ROXICODONE) immediate release tablet 5-10 mg  5-10 mg Oral Q3H PRN Wayne E Gold, PA-C   10 mg at 08/22/15 1310  . pantoprazole (PROTONIX) EC tablet 40 mg  40 mg Oral Daily John Giovanni, PA-C   40 mg at 08/23/15 0908  . potassium chloride SA (K-DUR,KLOR-CON) CR  tablet 20 mEq  20 mEq Oral Daily Ivin Poot, MD   20 mEq at 08/23/15 [id number]  . simethicone (MYLICON) chewable tablet 80 mg  80 mg Oral QID Ivin Poot, MD   80 mg at 08/23/15 0908  . simvastatin (ZOCOR) tablet 40 mg  40 mg Oral q1800 Jettie Booze, MD   40 mg at 08/22/15 1713  . sodium chloride flush (NS) 0.9 % injection 10-40 mL  10-40 mL Intracatheter Q12H Ivin Poot, MD   40 mL at 08/23/15 1500  . sodium chloride flush (NS) 0.9 % injection 10-40 mL  10-40 mL Intracatheter PRN Ivin Poot, MD      . sodium chloride flush (NS) 0.9 % injection 3 mL  3 mL Intravenous Q12H Wayne E Gold, PA-C   3 mL at 08/23/15 0913  . sodium chloride flush (NS) 0.9 % injection 3 mL  3 mL Intravenous PRN John Giovanni, PA-C   3 mL at 08/22/15 1936  . spiritus frumenti (ethyl alcohol) solution 1 each  1 each Oral BID AC Ivin Poot, MD   1 each at 08/23/15 1200  . [START ON 08/24/2015] thiamine (VITAMIN B-1) tablet 100 mg  100 mg Oral Daily Ivin Poot, MD      . traMADol Veatrice Bourbon) tablet 50-100 mg  50-100 mg Oral Q4H PRN John Giovanni, PA-C         Discharge Medications: Please see discharge summary for a list of discharge medications.  Relevant Imaging Results:  Relevant Lab Results:   Additional Information [id number]  Samule Dry, LCSW

## 2015-08-23 NOTE — Progress Notes (Signed)
Patient ID: Victor Diaz, male   DOB: [birth year], 70 y.o.   MRN: [id number]   SICU Evening Rounds:   Hemodynamically stable, atrial fib with controlled rate. Amio stopped due to nausea.  Had two BM's today. Urine output good    CBC    Component Value Date/Time   WBC 10.4 08/23/2015 0350   WBC 15.1* 01/27/2012 1332   RBC 2.45* 08/23/2015 0350   RBC 4.31* 01/27/2012 1332   HGB 9.2* 08/23/2015 1553   HGB 14.8 01/27/2012 1332   HCT 27.0* 08/23/2015 1553   HCT 42.1 01/27/2012 1332   PLT 89* 08/23/2015 0350   PLT 138* 01/27/2012 1332   MCV 98.0 08/23/2015 0350   MCV 98 01/27/2012 1332   MCH 32.7 08/23/2015 0350   MCH 34.3* 01/27/2012 1332   MCHC 33.3 08/23/2015 0350   MCHC 35.1 01/27/2012 1332   RDW 14.6 08/23/2015 0350   RDW 13.9 01/27/2012 1332   LYMPHSABS 1.9 10/14/2014 1654   MONOABS 0.6 10/14/2014 1654   EOSABS 0.1 10/14/2014 1654   BASOSABS 0.1 10/14/2014 1654     BMET    Component Value Date/Time   NA 124* 08/23/2015 1553   NA 132* 01/27/2012 1332   K 4.5 08/23/2015 1553   K 4.5 01/27/2012 1332   CL 88* 08/23/2015 1553   CL 98 01/27/2012 1332   CO2 24 08/23/2015 0350   CO2 24 01/27/2012 1332   GLUCOSE 107* 08/23/2015 1553   GLUCOSE 113* 01/27/2012 1332   BUN 11 08/23/2015 1553   BUN 12 01/27/2012 1332   CREATININE 0.90 08/23/2015 1553   CREATININE 1.11 01/27/2012 1332   CALCIUM 7.8* 08/23/2015 0350   CALCIUM 9.5 01/27/2012 1332   GFRNONAA >60 08/23/2015 0350   GFRNONAA >60 01/27/2012 1332   GFRAA >60 08/23/2015 0350   GFRAA >60 01/27/2012 1332     A/P:  Stable postop course. Continue current plans

## 2015-08-23 NOTE — Progress Notes (Signed)
PT Cancellation Note  Patient Details Name: Victor Diaz MRN: [id number] DOB: [birth year]   Cancelled Treatment:    Reason Eval/Treat Not Completed: Other (comment)  Pt just ambulated with Nsg staff.  Will try back another time.     Hermena Swint, Thornton Papas 08/23/2015, 9:30 AM

## 2015-08-23 NOTE — Progress Notes (Signed)
3 Days Post-Op Procedure(s) (LRB): CORONARY ARTERY BYPASS GRAFTING (CABG) TIMES FOUR  UTILIZING THE LEFT INTERNAL MAMMARY ARTERY AND ENDOSCOPICALLY HARVESTED RIGHT SAPHENEOUS VEINS. CLIPPING OF ATRIAL APPENDAGE (N/A) TRANSESOPHAGEAL ECHOCARDIOGRAM (TEE) (N/A) Subjective: Slow progress Afib, nausea from amiodarone, deconditioned, fluid overload  Objective: Vital signs in last 24 hours: Temp:  [97.4 F (36.3 C)-98.4 F (36.9 C)] 97.4 F (36.3 C) (05/08 1200) Pulse Rate:  [86] 86 (05/08 0908) Cardiac Rhythm:  [-] Normal sinus rhythm (05/08 1200) Resp:  [0-24] 18 (05/08 1200) BP: (106-144)/(60-97) 113/69 mmHg (05/08 1200) SpO2:  [96 %-100 %] 98 % (05/08 1200) Weight:  [223 lb 5.2 oz (101.3 kg)] 223 lb 5.2 oz (101.3 kg) (05/08 0600)  Hemodynamic parameters for last 24 hours:  afeb afib  Intake/Output from previous day: 05/07 0701 - 05/08 0700 In: 2476.7 [P.O.:1110; I.V.:1216.7; IV Piggyback:150] Out: 2050 [Urine:1870; Chest Tube:180] Intake/Output this shift: Total I/O In: 43.2 [I.V.:43.2] Out: 345 [Urine:345]       Exam    General- alert and comfortablebut nauseated   Lungs- clear without rales, wheezes   Cor- regular rate and rhythm, no murmur , gallop   Abdomen-distended   Extremities - warm, non-tender, modl edema   Neuro- oriented, appropriate, no focal weakness   Lab Results:  Recent Labs  08/22/15 0401 08/22/15 1611 08/23/15 0350  WBC 12.1*  --  10.4  HGB 8.5* 9.2* 8.0*  HCT 25.2* 27.0* 24.0*  PLT 76*  --  89*   BMET:  Recent Labs  08/22/15 0401 08/22/15 1611 08/23/15 0350  NA 130* 130* 123*  K 4.3 4.2 3.9  CL 98* 92* 91*  CO2 25  --  24  GLUCOSE 154* 133* 136*  BUN 6 9 8   CREATININE 1.00 0.90 0.97  CALCIUM 7.9*  --  7.8*    PT/INR:  Recent Labs  08/20/15 1800  LABPROT 19.7*  INR 1.67*   ABG    Component Value Date/Time   PHART 7.312* 08/21/2015 0109   HCO3 21.2 08/21/2015 0109   TCO2 25 08/22/2015 1611   ACIDBASEDEF 5.0*  08/21/2015 0109   O2SAT 88.0 08/21/2015 0109   CBG (last 3)   Recent Labs  08/23/15 0348 08/23/15 0828 08/23/15 1224  GLUCAP 131* 149* 109*    Assessment/Plan: S/P Procedure(s) (LRB): CORONARY ARTERY BYPASS GRAFTING (CABG) TIMES FOUR  UTILIZING THE LEFT INTERNAL MAMMARY ARTERY AND ENDOSCOPICALLY HARVESTED RIGHT SAPHENEOUS VEINS. CLIPPING OF ATRIAL APPENDAGE (N/A) TRANSESOPHAGEAL ECHOCARDIOGRAM (TEE) (N/A) Mobilize Diuresis Diabetes control stop amiodarone due to nausea   LOS: 4 days    Victor Diaz 08/23/2015

## 2015-08-23 NOTE — Progress Notes (Signed)
Pt Lt PCT D/C'd per MD orders; Pt H.O.B. Raised to 30 degrees prior to removal to allow for extra drainage potential; Pt pre-medicated for procedure and laid flat; all old dressings removed and sutures isolated for securing site of PCT; PCT was then removed without any difficulty and intact; sites secured with in place sutures and dressed with petroleum dressing and gauze, Pt tolerated procedure without difficulty.

## 2015-08-24 ENCOUNTER — Inpatient Hospital Stay (HOSPITAL_COMMUNITY): Payer: Medicare Other

## 2015-08-24 DIAGNOSIS — I480 Paroxysmal atrial fibrillation: Secondary | ICD-10-CM

## 2015-08-24 DIAGNOSIS — E785 Hyperlipidemia, unspecified: Secondary | ICD-10-CM

## 2015-08-24 DIAGNOSIS — E871 Hypo-osmolality and hyponatremia: Secondary | ICD-10-CM

## 2015-08-24 DIAGNOSIS — I10 Essential (primary) hypertension: Secondary | ICD-10-CM | POA: Insufficient documentation

## 2015-08-24 DIAGNOSIS — D62 Acute posthemorrhagic anemia: Secondary | ICD-10-CM

## 2015-08-24 DIAGNOSIS — D696 Thrombocytopenia, unspecified: Secondary | ICD-10-CM

## 2015-08-24 DIAGNOSIS — I5021 Acute systolic (congestive) heart failure: Secondary | ICD-10-CM

## 2015-08-24 DIAGNOSIS — I2510 Atherosclerotic heart disease of native coronary artery without angina pectoris: Secondary | ICD-10-CM | POA: Insufficient documentation

## 2015-08-24 DIAGNOSIS — Z951 Presence of aortocoronary bypass graft: Secondary | ICD-10-CM

## 2015-08-24 DIAGNOSIS — R0682 Tachypnea, not elsewhere classified: Secondary | ICD-10-CM

## 2015-08-24 DIAGNOSIS — I771 Stricture of artery: Secondary | ICD-10-CM

## 2015-08-24 DIAGNOSIS — E669 Obesity, unspecified: Secondary | ICD-10-CM

## 2015-08-24 DIAGNOSIS — R000 Tachycardia, unspecified: Secondary | ICD-10-CM

## 2015-08-24 LAB — COMPREHENSIVE METABOLIC PANEL
ALBUMIN: 2.8 g/dL — AB (ref 3.5–5.0)
ALT: 19 U/L (ref 17–63)
AST: 32 U/L (ref 15–41)
Alkaline Phosphatase: 32 U/L — ABNORMAL LOW (ref 38–126)
Anion gap: 10 (ref 5–15)
BILIRUBIN TOTAL: 0.9 mg/dL (ref 0.3–1.2)
BUN: 11 mg/dL (ref 6–20)
CHLORIDE: 90 mmol/L — AB (ref 101–111)
CO2: 24 mmol/L (ref 22–32)
CREATININE: 1.01 mg/dL (ref 0.61–1.24)
Calcium: 7.9 mg/dL — ABNORMAL LOW (ref 8.9–10.3)
GFR calc Af Amer: >60 mL/min (ref >60)
GLUCOSE: 99 mg/dL (ref 65–99)
Potassium: 4.4 mmol/L (ref 3.5–5.1)
Sodium: 124 mmol/L — ABNORMAL LOW (ref 135–145)
TOTAL PROTEIN: 5.8 g/dL — AB (ref 6.5–8.1)

## 2015-08-24 LAB — CBC
HEMATOCRIT: 24.3 % — AB (ref 39.0–52.0)
Hemoglobin: 8.3 g/dL — ABNORMAL LOW (ref 13.0–17.0)
MCH: 33.1 pg (ref 26.0–34.0)
MCHC: 34.2 g/dL (ref 30.0–36.0)
MCV: 96.8 fL (ref 78.0–100.0)
PLATELETS: 121 10*3/uL — AB (ref 150–400)
RBC: 2.51 MIL/uL — ABNORMAL LOW (ref 4.22–5.81)
RDW: 14.3 % (ref 11.5–15.5)
WBC: 10 10*3/uL (ref 4.0–10.5)

## 2015-08-24 LAB — GLUCOSE, CAPILLARY
GLUCOSE-CAPILLARY: 104 mg/dL — AB (ref 65–99)
GLUCOSE-CAPILLARY: 102 mg/dL — AB (ref 65–99)
GLUCOSE-CAPILLARY: 103 mg/dL — AB (ref 65–99)
Glucose-Capillary: 105 mg/dL — ABNORMAL HIGH (ref 65–99)
Glucose-Capillary: 133 mg/dL — ABNORMAL HIGH (ref 65–99)
Glucose-Capillary: 254 mg/dL — ABNORMAL HIGH (ref 65–99)
Glucose-Capillary: 98 mg/dL (ref 65–99)

## 2015-08-24 LAB — POCT I-STAT, CHEM 8
BUN: 12 mg/dL (ref 6–20)
CHLORIDE: 89 mmol/L — AB (ref 101–111)
Calcium, Ion: 1.05 mmol/L — ABNORMAL LOW (ref 1.13–1.30)
Creatinine, Ser: 0.9 mg/dL (ref 0.61–1.24)
Glucose, Bld: 250 mg/dL — ABNORMAL HIGH (ref 65–99)
HEMATOCRIT: 26 % — AB (ref 39.0–52.0)
Hemoglobin: 8.8 g/dL — ABNORMAL LOW (ref 13.0–17.0)
Potassium: 5.1 mmol/L (ref 3.5–5.1)
SODIUM: 125 mmol/L — AB (ref 135–145)
TCO2: 25 mmol/L (ref 0–100)

## 2015-08-24 MED ORDER — KCL IN DEXTROSE-NACL 20-5-0.9 MEQ/L-%-% IV SOLN
INTRAVENOUS | Status: DC
  Administered 2015-08-24 – 2015-08-25 (×2): via INTRAVENOUS
  Filled 2015-08-24 (×3): qty 1000

## 2015-08-24 MED ORDER — SODIUM CHLORIDE 0.9 % IV SOLN
INTRAVENOUS | Status: DC
  Administered 2015-08-24 – 2015-08-25 (×3): via INTRAVENOUS

## 2015-08-24 MED ORDER — BISACODYL 10 MG RE SUPP: 10.0000 mg | Freq: Once | RECTAL | Status: AC

## 2015-08-24 MED ORDER — IPRATROPIUM-ALBUTEROL 0.5-2.5 (3) MG/3ML IN SOLN: 3.0000 mL | Freq: Four times a day (QID) | RESPIRATORY_TRACT | Status: DC

## 2015-08-24 MED ORDER — INSULIN ASPART 100 UNIT/ML ~~LOC~~ SOLN
0.0000 [IU] | SUBCUTANEOUS | Status: DC
  Administered 2015-08-25: 7 [IU] via SUBCUTANEOUS
  Administered 2015-08-26: 3 [IU] via SUBCUTANEOUS
  Administered 2015-08-26: 4 [IU] via SUBCUTANEOUS
  Administered 2015-08-26: 7 [IU] via SUBCUTANEOUS
  Administered 2015-08-26: 3 [IU] via SUBCUTANEOUS
  Administered 2015-08-26: 7 [IU] via SUBCUTANEOUS
  Administered 2015-08-26 – 2015-08-27 (×3): 3 [IU] via SUBCUTANEOUS
  Administered 2015-08-27: 4 [IU] via SUBCUTANEOUS
  Administered 2015-08-27 – 2015-08-29 (×9): 3 [IU] via SUBCUTANEOUS

## 2015-08-24 MED ORDER — TRAVASOL 10 % IV SOLN: INTRAVENOUS | Status: DC

## 2015-08-24 MED ORDER — IPRATROPIUM-ALBUTEROL 0.5-2.5 (3) MG/3ML IN SOLN
RESPIRATORY_TRACT | Status: AC
  Filled 2015-08-24: qty 3

## 2015-08-24 MED ORDER — IPRATROPIUM-ALBUTEROL 0.5-2.5 (3) MG/3ML IN SOLN
3.0000 mL | Freq: Four times a day (QID) | RESPIRATORY_TRACT | Status: DC | PRN
  Administered 2015-08-24 – 2015-09-06 (×3): 3 mL via RESPIRATORY_TRACT
  Filled 2015-08-24 (×2): qty 3

## 2015-08-24 MED ORDER — POTASSIUM CHLORIDE 10 MEQ/50ML IV SOLN
10.0000 meq | INTRAVENOUS | Status: AC
  Filled 2015-08-24 (×2): qty 50

## 2015-08-24 NOTE — Consult Note (Signed)
Physical Medicine and Rehabilitation Consult Reason for Consult: Deconditioning after CABG Referring Physician: Dr. Nils Pyle   HPI: Victor Diaz is a 70 y.o. right handed male with history of obesity, hypertension, hyperlipidemia, PAF and poor anticoagulation candidate due to alcohol abuse and history of syncope, subclavian artery stenosis status post stenting. Patient lives with son and grandchildren. Independent prior to admission. Limited assistance of family. One level home with basement. Presented 08/19/2015 with episodes of dizziness and presyncope, chest discomfort and increasing dyspnea with exertion. Recent cardiac stress test positive for ischemia and subsequent cardiac catheterization demonstrated severe multivessel coronary artery disease. Echocardiogram with ejection fraction of 45% diffuse hypokinesis. Underwent CABG 4 08/20/2015 per [name] trigt. Hospital course acute blood loss anemia 8.0-8.3 and monitored. Currently maintained on aspirin 81 mg daily after CABG. Subcutaneous Lovenox for DVT prophylaxis. Developed atrial fibrillation 08/22/2015 maintained on intravenous amiodarone and transitioned to Lopressor secondary to nausea from amiodarone. Physical therapy evaluation completed 08/22/2015 with strict sternal precautions and recommendations of physical medicine rehabilitation consult.   Review of Systems  Constitutional: Negative for fever and chills.  HENT: Negative for hearing loss.   Eyes: Negative for blurred vision and double vision.  Respiratory:       Shortness of breath with exertion  Cardiovascular: Positive for chest pain and leg swelling.  Gastrointestinal: Negative for vomiting.  Genitourinary: Positive for urgency. Negative for dysuria and hematuria.  Skin: Negative for rash.  Neurological: Positive for weakness. Negative for seizures and headaches.       Syncopal episodes  All other systems reviewed and are negative.  Past Medical History    Diagnosis Date  . Allergy   . Gout   . Hyperlipidemia   . Alcohol abuse   . Hemothorax on right   . Lisfranc's dislocation 10/20/2011  . Right fibular fracture 10/20/2011  . Essential hypertension   . PSVT (paroxysmal supraventricular tachycardia) ([place])   . Paroxysmal atrial flutter ([facility])     Poor anticoagulation candidate with active alcohol abuse and syncope  . Subclavian artery stenosis, left     Status post stent placement by Dr. Trula Slade 10/2014   Past Surgical History  Procedure Laterality Date  . Appendectomy    . Hernia repair    . Tonsilectomy, adenoidectomy, bilateral myringotomy and tubes    . Revision total hip arthroplasty  08/28/2008  . Partial hip arthroplasty Left 2010  . Peripheral vascular catheterization N/A 11/11/2014    Procedure: Aortic Arch Angiography/Left Subclavion Stent;  Surgeon: Serafina Mitchell, MD;  Location: [facility];  Service: Cardiovascular;  Laterality: N/A;  . Cardiac catheterization N/A 08/19/2015    Procedure: Left Heart Cath and Cors/Grafts Angiography;  Surgeon: Jettie Booze, MD;  Location: [facility];  Service: Cardiovascular;  Laterality: N/A;  . Coronary artery bypass graft N/A 08/20/2015    Procedure: CORONARY ARTERY BYPASS GRAFTING (CABG) TIMES FOUR  UTILIZING THE LEFT INTERNAL MAMMARY ARTERY AND ENDOSCOPICALLY HARVESTED RIGHT SAPHENEOUS VEINS. CLIPPING OF ATRIAL APPENDAGE;  Surgeon: Ivin Poot, MD;  Location: [facility];  Service: Open Heart Surgery;  Laterality: N/A;  . Tee without cardioversion N/A 08/20/2015    Procedure: TRANSESOPHAGEAL ECHOCARDIOGRAM (TEE);  Surgeon: Ivin Poot, MD;  Location: [facility];  Service: Open Heart Surgery;  Laterality: N/A;   Family History  Problem Relation Age of Onset  . Arthritis Mother   . Cerebral aneurysm Mother   . Tuberculosis Father    Social History:  reports that he quit smoking about 46 years ago. His smoking use included Cigarettes. He has a 10 pack-year smoking history. He  has never used smokeless tobacco. He reports that he drinks about 37.8 oz of alcohol per week. He reports that he does not use illicit drugs. Allergies:  Allergies  Allergen Reactions  . Atorvastatin Anaphylaxis    LIPITOR -  facial swelling   Medications Prior to Admission  Medication Sig Dispense Refill  . aspirin 325 MG tablet Take 325 mg by mouth daily.     . cetirizine (ZYRTEC) 10 MG tablet TAKE ONE TABLET BY MOUTH ONCE DAILY AS NEEDED. (Patient taking differently: Take 10 mg by mouth daily as needed for allergies. TAKE ONE TABLET BY MOUTH ONCE DAILY AS NEEDED.) 60 tablet 0  . metoprolol succinate (TOPROL-XL) 25 MG 24 hr tablet **MUST SCHEDULE APPT FOR FURTHER REFILLS.** (Patient taking differently: Take 25 mg by mouth daily. **MUST SCHEDULE APPT FOR FURTHER REFILLS.**) 180 tablet 0  . nitroGLYCERIN (NITROSTAT) 0.4 MG SL tablet Place 1 tablet (0.4 mg total) under the tongue every 5 (five) minutes as needed. 25 tablet 3  . omeprazole (PRILOSEC) 20 MG capsule TAKE 1 CAPSULE BY MOUTH DAILY AS NEEDED FOR HEART BURN. 90 capsule 1  . simvastatin (ZOCOR) 40 MG tablet TAKE (1) TABLET BY MOUTH AT BEDTIME. 90 tablet 1  . Multiple Vitamin (MULTIVITAMIN WITH MINERALS) TABS tablet Take 1 tablet by mouth daily. 30 tablet 0    Home: Home Living Family/patient expects to be discharged to:: Private residence Living Arrangements: Children Available Help at Discharge: Family, Available 24 hours/day Type of Home: House Home Access: Stairs to enter [employer] of Steps: 2 Entrance Stairs-Rails: None Home Layout: Laundry or work area in basement, Able to live on main level with bedroom/bathroom Home Equipment: Environmental consultant - 2 wheels, Bedside commode, Cane - single point  Functional History: Prior Function Level of Independence: Independent Functional Status:  Mobility: Bed Mobility General bed mobility comments: pt sitting in recliner. Transfers Overall transfer level: Needs  assistance Equipment used: Rolling walker (2 wheeled) Transfers: Sit to/from Stand Sit to Stand: Min assist, +2 physical assistance General transfer comment: cues for UEs to knees and using momentum to come to standing.   Ambulation/Gait Ambulation/Gait assistance: Min assist, +2 safety/equipment Ambulation Distance (Feet): 60 Feet Assistive device: Rolling walker (2 wheeled) Gait Pattern/deviations: Step-through pattern, Decreased stride length General Gait Details: pt tremulous throughout mobility and pt indicates he did this with OOB to chair this am.  pt needs cues for slow pursed lip breathing and encouragement.  2nd person present to A with management of equipment and chair follow.      ADL:    Cognition: Cognition Overall Cognitive Status: Within Functional Limits for tasks assessed Orientation Level: Oriented X4 Cognition Arousal/Alertness: Awake/alert Behavior During Therapy: WFL for tasks assessed/performed Overall Cognitive Status: Within Functional Limits for tasks assessed  Blood pressure 139/64, pulse 100, temperature 97.4 F (36.3 C), temperature source Oral, resp. rate 15, height 5\' 6"  (1.676 m), weight 101.3 kg (223 lb 5.2 oz), SpO2 95 %. Physical Exam  Vitals reviewed. Constitutional: He is oriented to person, place, and time. He appears well-developed and well-nourished.  HENT:  Head: Normocephalic and atraumatic.  Eyes: Conjunctivae and EOM are normal.  Neck: Normal range of motion. Neck supple. No thyromegaly present.  Cardiovascular:  Irregularly irregular  Respiratory: Effort normal and breath sounds normal. No respiratory distress.  GI: Soft. Bowel sounds are normal. He exhibits no distension.  Obese  Musculoskeletal: He exhibits edema. He exhibits no tenderness.  Neurological: He is alert and oriented to person, place, and time.  Follows simple commands Sensation intact light touch DTRs symmetric Motor: Bilateral upper extremities >/4/5 (not fully  tested) Bilateral lower extremities: 5/5 proximal to distal  Skin: Skin is warm and dry.  Midline chest incision clean and dry  Psychiatric: He has a normal mood and affect. His behavior is normal.   Results for orders placed or performed during the hospital encounter of 08/19/15 (from the past 24 hour(s))  Glucose, capillary     Status: Abnormal   Collection Time: 08/23/15  8:28 AM  Result Value Ref Range   Glucose-Capillary 149 (H) 65 - 99 mg/dL   Comment 1 Notify RN   Glucose, capillary     Status: Abnormal   Collection Time: 08/23/15 12:24 PM  Result Value Ref Range   Glucose-Capillary 109 (H) 65 - 99 mg/dL   Comment 1 Notify RN   I-STAT, chem 8     Status: Abnormal   Collection Time: 08/23/15  3:53 PM  Result Value Ref Range   Sodium 124 (L) 135 - 145 mmol/L   Potassium 4.5 3.5 - 5.1 mmol/L   Chloride 88 (L) 101 - 111 mmol/L   BUN 11 6 - 20 mg/dL   Creatinine, Ser 0.90 0.61 - 1.24 mg/dL   Glucose, Bld 107 (H) 65 - 99 mg/dL   Calcium, Ion 1.11 (L) 1.13 - 1.30 mmol/L   TCO2 23 0 - 100 mmol/L   Hemoglobin 9.2 (L) 13.0 - 17.0 g/dL   HCT 27.0 (L) 39.0 - 52.0 %  Glucose, capillary     Status: None   Collection Time: 08/23/15  5:24 PM  Result Value Ref Range   Glucose-Capillary 98 65 - 99 mg/dL  Glucose, capillary     Status: Abnormal   Collection Time: 08/23/15  7:46 PM  Result Value Ref Range   Glucose-Capillary 145 (H) 65 - 99 mg/dL   Comment 1 Notify RN    Comment 2 Document in Chart   Glucose, capillary     Status: Abnormal   Collection Time: 08/23/15  9:39 PM  Result Value Ref Range   Glucose-Capillary 124 (H) 65 - 99 mg/dL   Comment 1 Notify RN    Comment 2 Document in Chart   Comprehensive metabolic panel     Status: Abnormal   Collection Time: 08/24/15  4:00 AM  Result Value Ref Range   Sodium 124 (L) 135 - 145 mmol/L   Potassium 4.4 3.5 - 5.1 mmol/L   Chloride 90 (L) 101 - 111 mmol/L   CO2 24 22 - 32 mmol/L   Glucose, Bld 99 65 - 99 mg/dL   BUN 11 6 - 20  mg/dL   Creatinine, Ser 1.01 0.61 - 1.24 mg/dL   Calcium 7.9 (L) 8.9 - 10.3 mg/dL   Total Protein 5.8 (L) 6.5 - 8.1 g/dL   Albumin 2.8 (L) 3.5 - 5.0 g/dL   AST 32 15 - 41 U/L   ALT 19 17 - 63 U/L   Alkaline Phosphatase 32 (L) 38 - 126 U/L   Total Bilirubin 0.9 0.3 - 1.2 mg/dL   GFR calc non Af Amer >60 >60 mL/min   GFR calc Af Amer >60 >60 mL/min   Anion gap 10 5 - 15  CBC     Status: Abnormal   Collection Time: 08/24/15  4:00 AM  Result Value Ref Range  WBC 10.0 4.0 - 10.5 K/uL   RBC 2.51 (L) 4.22 - 5.81 MIL/uL   Hemoglobin 8.3 (L) 13.0 - 17.0 g/dL   HCT 24.3 (L) 39.0 - 52.0 %   MCV 96.8 78.0 - 100.0 fL   MCH 33.1 26.0 - 34.0 pg   MCHC 34.2 30.0 - 36.0 g/dL   RDW 14.3 11.5 - 15.5 %   Platelets 121 (L) 150 - 400 K/uL   Dg Chest Port 1 View  08/23/2015  CLINICAL DATA:  Status post CABG on 20 Aug 2015, left atrial appendage clipping EXAM: PORTABLE CHEST 1 VIEW COMPARISON:  Portable chest x-ray of Aug 22, 2015 FINDINGS: The cardiac silhouette remains enlarged. The pulmonary interstitial markings have become less conspicuous. The pulmonary vascularity is not engorged. There is subsegmental atelectasis at the left lung base which has improved. There is a trace of pleural fluid blunting the costophrenic angles. There is no pneumothorax. The right internal jugular Cordis sheath tip projects over the proximal SVC. The mediastinal drain and left chest tube have been removed. The observed sternal wires are intact. The left atrial appendage clip is in stable position. IMPRESSION: Ongoing improvement in the appearance of the chest with decreased pulmonary interstitial edema. Persistent trace bilateral pleural effusions and mild left lower lobe atelectasis. Electronically Signed   By: David  Martinique M.D.   On: 08/23/2015 07:44    Assessment/Plan: Diagnosis: Deconditioning after CABG Labs and images independently reviewed.  Records reviewed and summated above.  1. Does the need for close, 24 hr/day  medical supervision in concert with the patient's rehab needs make it unreasonable for this patient to be served in a less intensive setting? Yes  Co-Morbidities requiring supervision/potential complications: Obesity ( Body mass index is 36.03 kg/(m^2)., diet and exercise education, encourage weight loss to increase endurance and promote overall health), hyperlipidemia (cont meds), PAF ( poor anticoagulation candidate due to alcohol abuse and history of syncope), subclavian artery stenosis (status post stenting), systolic CHF (Monitor in accordance with increased physical activity and avoid UE resistance excercises), ABLA (transfuse if necessary to ensure appropriate perfusion for increased activity tolerance), Tachycardia (monitor in accordance with pain and increasing activity), tachypnea (monitor RR and O2 Sats with increased physical exertion), HTN (monitor and provide prns in accordance with increased physical exertion and pain), hyponatremia (continue to monitor, treat if necessary), Thrombocytopenia (< 60,000/mm3 no resistive exercise) 2. Due to bladder management, safety, skin/wound care, disease management, pain management and patient education, does the patient require 24 hr/day rehab nursing? Yes 3. Does the patient require coordinated care of a physician, rehab nurse, PT (1-2 hrs/day, 5 days/week) and OT (1-2 hrs/day, 5 days/week) to address physical and functional deficits in the context of the above medical diagnosis(es)? Yes Addressing deficits in the following areas: balance, endurance, locomotion, strength, transferring, bathing, dressing, toileting and psychosocial support 4. Can the patient actively participate in an intensive therapy program of at least 3 hrs of therapy per day at least 5 days per week? Yes 5. The potential for patient to make measurable gains while on inpatient rehab is excellent 6. Anticipated functional outcomes upon discharge from inpatient rehab are modified  independent  with PT, modified independent with OT, n/a with SLP. 7. Estimated rehab length of stay to reach the above functional goals is: 7-10 days. 8. Does the patient have adequate social supports and living environment to accommodate these discharge functional goals? Yes 9. Anticipated D/C setting: Home 10. Anticipated post D/C treatments: HH therapy and Home excercise  program 11. Overall Rehab/Functional Prognosis: good  RECOMMENDATIONS: This patient's condition is appropriate for continued rehabilitative care in the following setting: CIR once medically appropriate. Patient has agreed to participate in recommended program. Yes Note that insurance prior authorization may be required for reimbursement for recommended care.  Comment: Rehab Admissions Coordinator to follow up.  Delice Lesch, MD 08/24/2015

## 2015-08-24 NOTE — Consult Note (Signed)
[facility] CONSULT Reason for consult: colonic ileus Referring Physician: CVT Chauncey Cruel  Victor Diaz is an 70 y.o. male.  HPI: patient is 4 days status post CABG. He presented with exertional chest pain. He's had a history of left subclavian stenosis. Patient was found to have severe 3 vessel CAD. He has a history of alcohol abuse and has been to rehab. He said a history of the THR recently. He notes at the time of this hip replacement he had a severe ileus that took several days to resolve. He had a small bowel movements and surgery very little is become progressively more distended. He is on a multitude of medications including Lovenox aspirin pain medications insulin metoclopramide metoprolol Protonix. He has been getting some KCl and is IV as well as PO. Because of his progressive distention, KUB obtained which showed a diffuse colonic ileus with the cecum measuring 15 cm. Patient reports the passing gas yesterday but none today. It's notable that his potassium is been around for but has briefly dip to 3.9. WBC is normal. He last had a colonoscopy he thinks by Dr. Sharlett Iles about 15 years ago. No chronic bleeding or family history of colon cancer.  Past Medical History  Diagnosis Date  . Allergy   . Gout   . Hyperlipidemia   . Alcohol abuse   . Hemothorax on right   . Lisfranc's dislocation 10/20/2011  . Right fibular fracture 10/20/2011  . Essential hypertension   . PSVT (paroxysmal supraventricular tachycardia) ([place])   . Paroxysmal atrial flutter ([facility])     Poor anticoagulation candidate with active alcohol abuse and syncope  . Subclavian artery stenosis, left     Status post stent placement by Dr. Trula Slade 10/2014    Past Surgical History  Procedure Laterality Date  . Appendectomy    . Hernia repair    . Tonsilectomy, adenoidectomy, bilateral myringotomy and tubes    . Revision total hip arthroplasty  08/28/2008  . Partial hip arthroplasty Left 2010  . Peripheral vascular  catheterization N/A 11/11/2014    Procedure: Aortic Arch Angiography/Left Subclavion Stent;  Surgeon: Serafina Mitchell, MD;  Location: [facility];  Service: Cardiovascular;  Laterality: N/A;  . Cardiac catheterization N/A 08/19/2015    Procedure: Left Heart Cath and Cors/Grafts Angiography;  Surgeon: Jettie Booze, MD;  Location: [facility];  Service: Cardiovascular;  Laterality: N/A;  . Coronary artery bypass graft N/A 08/20/2015    Procedure: CORONARY ARTERY BYPASS GRAFTING (CABG) TIMES FOUR  UTILIZING THE LEFT INTERNAL MAMMARY ARTERY AND ENDOSCOPICALLY HARVESTED RIGHT SAPHENEOUS VEINS. CLIPPING OF ATRIAL APPENDAGE;  Surgeon: Ivin Poot, MD;  Location: [facility];  Service: Open Heart Surgery;  Laterality: N/A;  . Tee without cardioversion N/A 08/20/2015    Procedure: TRANSESOPHAGEAL ECHOCARDIOGRAM (TEE);  Surgeon: Ivin Poot, MD;  Location: [facility];  Service: Open Heart Surgery;  Laterality: N/A;    Family History  Problem Relation Age of Onset  . Arthritis Mother   . Cerebral aneurysm Mother   . Tuberculosis Father     Social History:  reports that he quit smoking about 46 years ago. His smoking use included Cigarettes. He has a 10 pack-year smoking history. He has never used smokeless tobacco. He reports that he drinks about 37.8 oz of alcohol per week. He reports that he does not use illicit drugs.  Allergies:  Allergies  Allergen Reactions  . Atorvastatin Anaphylaxis    LIPITOR -  facial swelling  Medications; Prior to Admission medications   Medication Sig Start Date End Date Taking? Authorizing Provider  aspirin 325 MG tablet Take 325 mg by mouth daily.    Yes Historical Provider, MD  cetirizine (ZYRTEC) 10 MG tablet TAKE ONE TABLET BY MOUTH ONCE DAILY AS NEEDED. Patient taking differently: Take 10 mg by mouth daily as needed for allergies. TAKE ONE TABLET BY MOUTH ONCE DAILY AS NEEDED. 07/05/15  Yes Dorothyann Peng, NP  metoprolol succinate (TOPROL-XL) 25 MG  24 hr tablet **MUST SCHEDULE APPT FOR FURTHER REFILLS.** Patient taking differently: Take 25 mg by mouth daily. **MUST SCHEDULE APPT FOR FURTHER REFILLS.** 07/05/15  Yes Dorothyann Peng, NP  nitroGLYCERIN (NITROSTAT) 0.4 MG SL tablet Place 1 tablet (0.4 mg total) under the tongue every 5 (five) minutes as needed. 07/16/15  Yes Satira Sark, MD  omeprazole (PRILOSEC) 20 MG capsule TAKE 1 CAPSULE BY MOUTH DAILY AS NEEDED FOR HEART BURN. 05/20/14  Yes Kennyth Arnold, FNP  simvastatin (ZOCOR) 40 MG tablet TAKE (1) TABLET BY MOUTH AT BEDTIME. 04/23/15  Yes Dorothyann Peng, NP  Multiple Vitamin (MULTIVITAMIN WITH MINERALS) TABS tablet Take 1 tablet by mouth daily. 01/31/15   Silver Huguenin Elgergawy, MD   . acetaminophen  1,000 mg Oral Q6H   Or  . acetaminophen (TYLENOL) oral liquid 160 mg/5 mL  1,000 mg Per Tube Q6H  . antiseptic oral rinse  7 mL Mouth Rinse BID  . aspirin EC  81 mg Oral Daily  . bisacodyl  10 mg Oral Daily   Or  . bisacodyl  10 mg Rectal Daily  . bisacodyl  10 mg Rectal Once  . budesonide (PULMICORT) nebulizer solution  0.5 mg Nebulization BID  . docusate sodium  200 mg Oral Daily  . enoxaparin (LOVENOX) injection  40 mg Subcutaneous Q24H  . furosemide  40 mg Intravenous BID  . insulin aspart  0-20 Units Subcutaneous TID WC  . insulin aspart  0-5 Units Subcutaneous QHS  . insulin detemir  12 Units Subcutaneous BID  . metoCLOPramide (REGLAN) injection  10 mg Intravenous Q6H  . metoprolol tartrate  12.5 mg Oral BID   Or  . metoprolol tartrate  12.5 mg Per Tube BID  . pantoprazole  40 mg Oral Daily  . potassium chloride  10 mEq Intravenous Q1 Hr x 2  . potassium chloride  20 mEq Oral Daily  . simethicone  80 mg Oral QID  . simvastatin  40 mg Oral q1800  . sodium chloride flush  10-40 mL Intracatheter Q12H  . sodium chloride flush  3 mL Intravenous Q12H  . spiritus frumenti  1 each Oral BID AC  . thiamine  100 mg Oral Daily   PRN Meds sodium chloride, ALPRAZolam,  ipratropium-albuterol, metoprolol, ondansetron (ZOFRAN) IV, oxyCODONE, sodium chloride flush, sodium chloride flush, traMADol Results for orders placed or performed during the hospital encounter of 08/19/15 (from the past 48 hour(s))  I-STAT, chem 8     Status: Abnormal   Collection Time: 08/22/15  4:11 PM  Result Value Ref Range   Sodium 130 (L) 135 - 145 mmol/L   Potassium 4.2 3.5 - 5.1 mmol/L   Chloride 92 (L) 101 - 111 mmol/L   BUN 9 6 - 20 mg/dL   Creatinine, Ser 0.90 0.61 - 1.24 mg/dL   Glucose, Bld 133 (H) 65 - 99 mg/dL   Calcium, Ion 1.15 1.13 - 1.30 mmol/L   TCO2 25 0 - 100 mmol/L   Hemoglobin 9.2 (L) 13.0 -  17.0 g/dL   HCT 27.0 (L) 39.0 - 52.0 %  Glucose, capillary     Status: Abnormal   Collection Time: 08/22/15  7:35 PM  Result Value Ref Range   Glucose-Capillary 126 (H) 65 - 99 mg/dL   Comment 1 Notify RN    Comment 2 Document in Chart   Glucose, capillary     Status: Abnormal   Collection Time: 08/22/15 11:55 PM  Result Value Ref Range   Glucose-Capillary 111 (H) 65 - 99 mg/dL   Comment 1 Notify RN    Comment 2 Document in Chart   Glucose, capillary     Status: Abnormal   Collection Time: 08/23/15  3:48 AM  Result Value Ref Range   Glucose-Capillary 131 (H) 65 - 99 mg/dL   Comment 1 Venous Specimen    Comment 2 Notify RN   Comprehensive metabolic panel     Status: Abnormal   Collection Time: 08/23/15  3:50 AM  Result Value Ref Range   Sodium 123 (L) 135 - 145 mmol/L    Comment: DELTA CHECK NOTED   Potassium 3.9 3.5 - 5.1 mmol/L   Chloride 91 (L) 101 - 111 mmol/L   CO2 24 22 - 32 mmol/L   Glucose, Bld 136 (H) 65 - 99 mg/dL   BUN 8 6 - 20 mg/dL   Creatinine, Ser 0.97 0.61 - 1.24 mg/dL   Calcium 7.8 (L) 8.9 - 10.3 mg/dL   Total Protein 5.3 (L) 6.5 - 8.1 g/dL   Albumin 2.9 (L) 3.5 - 5.0 g/dL   AST 35 15 - 41 U/L   ALT 20 17 - 63 U/L   Alkaline Phosphatase 28 (L) 38 - 126 U/L   Total Bilirubin 0.9 0.3 - 1.2 mg/dL   GFR calc non Af Amer >60 >60 mL/min   GFR  calc Af Amer >60 >60 mL/min    Comment: (NOTE) The eGFR has been calculated using the CKD EPI equation. This calculation has not been validated in all clinical situations. eGFR's persistently <60 mL/min signify possible Chronic Kidney Disease.    Anion gap 8 5 - 15  CBC     Status: Abnormal   Collection Time: 08/23/15  3:50 AM  Result Value Ref Range   WBC 10.4 4.0 - 10.5 K/uL   RBC 2.45 (L) 4.22 - 5.81 MIL/uL   Hemoglobin 8.0 (L) 13.0 - 17.0 g/dL   HCT 24.0 (L) 39.0 - 52.0 %   MCV 98.0 78.0 - 100.0 fL   MCH 32.7 26.0 - 34.0 pg   MCHC 33.3 30.0 - 36.0 g/dL   RDW 14.6 11.5 - 15.5 %   Platelets 89 (L) 150 - 400 K/uL    Comment: SPECIMEN CHECKED FOR CLOTS REPEATED TO VERIFY CONSISTENT WITH PREVIOUS RESULT   Glucose, capillary     Status: Abnormal   Collection Time: 08/23/15  8:28 AM  Result Value Ref Range   Glucose-Capillary 149 (H) 65 - 99 mg/dL   Comment 1 Notify RN   Glucose, capillary     Status: Abnormal   Collection Time: 08/23/15 12:24 PM  Result Value Ref Range   Glucose-Capillary 109 (H) 65 - 99 mg/dL   Comment 1 Notify RN   I-STAT, chem 8     Status: Abnormal   Collection Time: 08/23/15  3:53 PM  Result Value Ref Range   Sodium 124 (L) 135 - 145 mmol/L   Potassium 4.5 3.5 - 5.1 mmol/L   Chloride 88 (L) 101 - 111  mmol/L   BUN 11 6 - 20 mg/dL   Creatinine, Ser 0.90 0.61 - 1.24 mg/dL   Glucose, Bld 107 (H) 65 - 99 mg/dL   Calcium, Ion 1.11 (L) 1.13 - 1.30 mmol/L   TCO2 23 0 - 100 mmol/L   Hemoglobin 9.2 (L) 13.0 - 17.0 g/dL   HCT 27.0 (L) 39.0 - 52.0 %  Glucose, capillary     Status: None   Collection Time: 08/23/15  5:24 PM  Result Value Ref Range   Glucose-Capillary 98 65 - 99 mg/dL  Glucose, capillary     Status: Abnormal   Collection Time: 08/23/15  7:46 PM  Result Value Ref Range   Glucose-Capillary 145 (H) 65 - 99 mg/dL   Comment 1 Notify RN    Comment 2 Document in Chart   Glucose, capillary     Status: Abnormal   Collection Time: 08/23/15  9:39  PM  Result Value Ref Range   Glucose-Capillary 124 (H) 65 - 99 mg/dL   Comment 1 Notify RN    Comment 2 Document in Chart   Comprehensive metabolic panel     Status: Abnormal   Collection Time: 08/24/15  4:00 AM  Result Value Ref Range   Sodium 124 (L) 135 - 145 mmol/L   Potassium 4.4 3.5 - 5.1 mmol/L   Chloride 90 (L) 101 - 111 mmol/L   CO2 24 22 - 32 mmol/L   Glucose, Bld 99 65 - 99 mg/dL   BUN 11 6 - 20 mg/dL   Creatinine, Ser 1.01 0.61 - 1.24 mg/dL   Calcium 7.9 (L) 8.9 - 10.3 mg/dL   Total Protein 5.8 (L) 6.5 - 8.1 g/dL   Albumin 2.8 (L) 3.5 - 5.0 g/dL   AST 32 15 - 41 U/L   ALT 19 17 - 63 U/L   Alkaline Phosphatase 32 (L) 38 - 126 U/L   Total Bilirubin 0.9 0.3 - 1.2 mg/dL   GFR calc non Af Amer >60 >60 mL/min   GFR calc Af Amer >60 >60 mL/min    Comment: (NOTE) The eGFR has been calculated using the CKD EPI equation. This calculation has not been validated in all clinical situations. eGFR's persistently <60 mL/min signify possible Chronic Kidney Disease.    Anion gap 10 5 - 15  CBC     Status: Abnormal   Collection Time: 08/24/15  4:00 AM  Result Value Ref Range   WBC 10.0 4.0 - 10.5 K/uL   RBC 2.51 (L) 4.22 - 5.81 MIL/uL   Hemoglobin 8.3 (L) 13.0 - 17.0 g/dL   HCT 24.3 (L) 39.0 - 52.0 %   MCV 96.8 78.0 - 100.0 fL   MCH 33.1 26.0 - 34.0 pg   MCHC 34.2 30.0 - 36.0 g/dL   RDW 14.3 11.5 - 15.5 %   Platelets 121 (L) 150 - 400 K/uL  Glucose, capillary     Status: Abnormal   Collection Time: 08/24/15  8:29 AM  Result Value Ref Range   Glucose-Capillary 105 (H) 65 - 99 mg/dL  Glucose, capillary     Status: Abnormal   Collection Time: 08/24/15 12:34 PM  Result Value Ref Range   Glucose-Capillary 133 (H) 65 - 99 mg/dL   Comment 1 Notify RN     Dg Chest 2 View  08/24/2015  CLINICAL DATA:  Shortness of breath. Status post coronary artery bypass grafting EXAM: CHEST  2 VIEW COMPARISON:  Aug 23, 2015 FINDINGS: Cordis has been removed. A right subclavian  catheter is now  present with the tip in the superior vena cava. No pneumothorax. There is persistent airspace consolidation in portions of the left upper and left lower lobe regions with small left effusion. There is mild atelectasis in the right base. Lungs elsewhere clear. There is cardiomegaly with pulmonary vascularity within normal limits. A stent is noted in the left subclavian region. There is a left atrial appendage clamp. There is evidence of coronary artery bypass grafting. Temporary pacemaker wires remain attached to the right heart. Loops of bowel appear mildly dilated. IMPRESSION: Areas of patchy infiltrate on the left, stable. Small left effusion. Mild right base atelectasis. No pneumothorax.  Stable cardiomegaly. Suspect a degree of bowel ileus. Electronically Signed   By: Lowella Grip III M.D.   On: 08/24/2015 08:07   Dg Chest Port 1 View  08/23/2015  CLINICAL DATA:  Status post CABG on 20 Aug 2015, left atrial appendage clipping EXAM: PORTABLE CHEST 1 VIEW COMPARISON:  Portable chest x-ray of Aug 22, 2015 FINDINGS: The cardiac silhouette remains enlarged. The pulmonary interstitial markings have become less conspicuous. The pulmonary vascularity is not engorged. There is subsegmental atelectasis at the left lung base which has improved. There is a trace of pleural fluid blunting the costophrenic angles. There is no pneumothorax. The right internal jugular Cordis sheath tip projects over the proximal SVC. The mediastinal drain and left chest tube have been removed. The observed sternal wires are intact. The left atrial appendage clip is in stable position. IMPRESSION: Ongoing improvement in the appearance of the chest with decreased pulmonary interstitial edema. Persistent trace bilateral pleural effusions and mild left lower lobe atelectasis. Electronically Signed   By: David  Martinique M.D.   On: 08/23/2015 07:44   Dg Abd Portable 1v  08/24/2015  CLINICAL DATA:  Nausea for 3 days.  History of ileus. EXAM:  PORTABLE ABDOMEN - 1 VIEW COMPARISON:  09/04/2008 FINDINGS: There is colonic distention, most evident involving what is likely the cecum and ascending colon. This measures 15 cm in diameter, which is magnified by the AP technique. There is no small bowel dilation. Soft tissues are unremarkable. There is are well aligned left hip prosthesis. IMPRESSION: 1. Colonic distention suggesting a diffuse colonic adynamic ileus. No small bowel dilation is seen to suggest obstruction. Electronically Signed   By: Lajean Manes M.D.   On: 08/24/2015 11:40               Blood pressure 111/78, pulse 93, temperature 98.4 F (36.9 C), temperature source Oral, resp. rate 13, height 5' 6"  (1.676 m), weight 101.2 kg (223 lb 1.7 oz), SpO2 97 %.  Physical exam:   General--pleasant white male who was alert and oriented. NG tube in place  ENT--nonicteric  Neck--thick neck no gross masses  Heart--somewhat tachycardic  Lungs--clear  Abdomen--markedly distended and nontender with a very few bowel sounds  Psych--alert and oriented to time and place   Assessment: 1. Colonic ileus. Probably due to a combination of pain medications exacerbated by slightly low potassium. Often postop colonic ileus will improve with aggressive potassium replacement. 2. CAD. Status post CABG 4 days ago 3. History of SVT and paroxysmal atrial flutter 4. History of alcohol abuse with [facility] in the past for detox 5. Status post appendectomy and hip replacement  Plan: 1. Agree with placement of NG tube with suction. Will also place rectal tube with suction. 2. Will give some additional runs of KCl and check potassium in the morning. We'll check KUB  in the morning. If no improvement overnight may need to consider colonoscopy with decompression. Have discussed with the patient.   Geri Hepler JR,Duval Macleod L 08/24/2015, 3:46 PM   This note was created using voice recognition software and minor errors may Have occurred  unintentionally. Pager: [phone] If no answer or after hours call [phone]

## 2015-08-24 NOTE — Progress Notes (Signed)
Patient ID: Victor Diaz, male   DOB: [birth year], 70 y.o.   MRN: [id number] EVENING ROUNDS NOTE :     [address]       [place] [address]             [phone]                 4 Days Post-Op Procedure(s) (LRB): CORONARY ARTERY BYPASS GRAFTING (CABG) TIMES FOUR  UTILIZING THE LEFT INTERNAL MAMMARY ARTERY AND ENDOSCOPICALLY HARVESTED RIGHT SAPHENEOUS VEINS. CLIPPING OF ATRIAL APPENDAGE (N/A) TRANSESOPHAGEAL ECHOCARDIOGRAM (TEE) (N/A)  Total Length of Stay:  LOS: 5 days  BP 128/117 mmHg  Pulse 93  Temp(Src) 97.8 F (36.6 C) (Oral)  Resp 18  Ht 5\' 6"  (1.676 m)  Wt 223 lb 1.7 oz (101.2 kg)  BMI 36.03 kg/m2  SpO2 86%  .Intake/Output      05/09 0701 - 05/10 0700   P.O.    I.V. (mL/kg) 470.8 (4.7)   Other 0   NG/GT 30   Total Intake(mL/kg) 500.8 (4.9)   Urine (mL/kg/hr) 2425 (1.7)   Stool 0 (0)   Total Output 2425   Net -1924.2       Stool Occurrence 1 x     . sodium chloride 75 mL/hr at 08/24/15 1800  . dextrose 5 % and 0.9 % NaCl with KCl 20 mEq/L 50 mL/hr at 08/24/15 1800  . lactated ringers Stopped (08/21/15 2000)     Lab Results  Component Value Date   WBC 10.0 08/24/2015   HGB 8.8* 08/24/2015   HCT 26.0* 08/24/2015   PLT 121* 08/24/2015   GLUCOSE 250* 08/24/2015   CHOL 160 11/19/2013   TRIG 178.0* 11/19/2013   HDL 36.50* 11/19/2013   LDLDIRECT 85.3 12/06/2012   LDLCALC 88 11/19/2013   ALT 19 08/24/2015   AST 32 08/24/2015   NA 125* 08/24/2015   K 5.1 08/24/2015   CL 89* 08/24/2015   CREATININE 0.90 08/24/2015   BUN 12 08/24/2015   CO2 24 08/24/2015   TSH 3.418 10/14/2014   PSA 0.47 11/03/2011   INR 1.67* 08/20/2015   HGBA1C 5.6 08/19/2015  colonic ileus k 5.1,  cr stable  Grace Isaac MD  Beeper [phone] Office [phone] 08/24/2015 8:45 PM

## 2015-08-24 NOTE — Progress Notes (Signed)
Physical Therapy Treatment Patient Details Name: JOSE PACEY MRN: [id number] DOB: [birth year] Today's Date: 08/24/2015    History of Present Illness pt presents with CABG x4.  pt with hx of Etoh, L THA, Gout, HTN, and A-Flutter.      PT Comments    Pt able to increase ambulation distance today, but continues to need cues throughout session for safety and sternal precautions.  Pt with increased WOB during ambulation, but VSS throughout.  Continue to feel pt would benefit from CIR level of therapies to maximize independence prior to returning to home.    Follow Up Recommendations  CIR     Equipment Recommendations  None recommended by PT    Recommendations for Other Services       Precautions / Restrictions Precautions Precautions: Fall;Sternal Restrictions Weight Bearing Restrictions: No    Mobility  Bed Mobility               General bed mobility comments: pt sitting in recliner  Transfers Overall transfer level: Needs assistance Equipment used: Pushed w/c Transfers: Sit to/from Stand Sit to Stand: Min assist         General transfer comment: cues for hands to his knees and use of momentum to come to standing.    Ambulation/Gait Ambulation/Gait assistance: Min assist Ambulation Distance (Feet): 140 Feet Assistive device:  (pushed W/C) Gait Pattern/deviations: Step-through pattern;Decreased stride length     General Gait Details: pt moves very slowly and noted DOE.  VSS throughout, but definitely note pt is more labred during ambulation.  cues for mroe upright posture.     Stairs            Wheelchair Mobility    Modified Rankin (Stroke Patients Only)       Balance Overall balance assessment: Needs assistance Sitting-balance support: No upper extremity supported;Feet supported Sitting balance-Leahy Scale: Fair     Standing balance support: Bilateral upper extremity supported;During functional activity Standing balance-Leahy Scale:  Poor                      Cognition Arousal/Alertness: Awake/alert Behavior During Therapy: WFL for tasks assessed/performed Overall Cognitive Status: Within Functional Limits for tasks assessed                      Exercises      General Comments        Pertinent Vitals/Pain Pain Assessment: No/denies pain    Home Living                      Prior Function            PT Goals (current goals can now be found in the care plan section) Acute Rehab PT Goals Patient Stated Goal: Walk PT Goal Formulation: With patient Time For Goal Achievement: 09/05/15 Potential to Achieve Goals: Good Progress towards PT goals: Progressing toward goals    Frequency  Min 3X/week    PT Plan Current plan remains appropriate    Co-evaluation             End of Session Equipment Utilized During Treatment: Gait belt;Oxygen Activity Tolerance: Patient tolerated treatment well Patient left: in chair;with call bell/phone within reach     Time: 0908-0925 PT Time Calculation (min) (ACUTE ONLY): 17 min  Charges:  $Gait Training: 8-22 mins                    G Codes:  Catarina Hartshorn, [place] 08/24/2015, 1:55 PM

## 2015-08-24 NOTE — Progress Notes (Signed)
4 Days Post-Op Procedure(s) (LRB): CORONARY ARTERY BYPASS GRAFTING (CABG) TIMES FOUR  UTILIZING THE LEFT INTERNAL MAMMARY ARTERY AND ENDOSCOPICALLY HARVESTED RIGHT SAPHENEOUS VEINS. CLIPPING OF ATRIAL APPENDAGE (N/A) TRANSESOPHAGEAL ECHOCARDIOGRAM (TEE) (N/A) Subjective: Unstable angina Obese, DM ETOH abuse Postop cecal dil to 15 cm- GI following  Objective: Vital signs in last 24 hours: Temp:  [97.4 F (36.3 C)-98.4 F (36.9 C)] 97.7 F (36.5 C) (05/09 1500) Pulse Rate:  [90-100] 93 (05/09 1200) Cardiac Rhythm:  [-] Atrial fibrillation (05/09 1600) Resp:  [11-29] 11 (05/09 1700) BP: (90-174)/(53-97) 118/78 mmHg (05/09 1700) SpO2:  [95 %-100 %] 96 % (05/09 1700) Weight:  [223 lb 1.7 oz (101.2 kg)] 223 lb 1.7 oz (101.2 kg) (05/09 0600)  Hemodynamic parameters for last 24 hours:  nsr  Intake/Output from previous day: 05/08 0701 - 05/09 0700 In: 933.2 [P.O.:840; I.V.:93.2] Out: 1745 [Urine:1745] Intake/Output this shift: Total I/O In: 185.8 [I.V.:155.8; NG/GT:30] Out: 1350 [Urine:1350]  abd distended non tender Scattered wheezing Lab Results:  Recent Labs  08/23/15 0350  08/24/15 0400 08/24/15 1617  WBC 10.4  --  10.0  --   HGB 8.0*  < > 8.3* 8.8*  HCT 24.0*  < > 24.3* 26.0*  PLT 89*  --  121*  --   < > = values in this interval not displayed. BMET:  Recent Labs  08/23/15 0350  08/24/15 0400 08/24/15 1617  NA 123*  < > 124* 125*  K 3.9  < > 4.4 5.1  CL 91*  < > 90* 89*  CO2 24  --  24  --   GLUCOSE 136*  < > 99 250*  BUN 8  < > 11 12  CREATININE 0.97  < > 1.01 0.90  CALCIUM 7.8*  --  7.9*  --   < > = values in this interval not displayed.  PT/INR: No results for input(s): LABPROT, INR in the last 72 hours. ABG    Component Value Date/Time   PHART 7.312* 08/21/2015 0109   HCO3 21.2 08/21/2015 0109   TCO2 25 08/24/2015 1617   ACIDBASEDEF 5.0* 08/21/2015 0109   O2SAT 88.0 08/21/2015 0109   CBG (last 3)   Recent Labs  08/24/15 1234 08/24/15 1616  08/24/15 1740  GLUCAP 133* 254* 104*    Assessment/Plan: S/P Procedure(s) (LRB): CORONARY ARTERY BYPASS GRAFTING (CABG) TIMES FOUR  UTILIZING THE LEFT INTERNAL MAMMARY ARTERY AND ENDOSCOPICALLY HARVESTED RIGHT SAPHENEOUS VEINS. CLIPPING OF ATRIAL APPENDAGE (N/A) TRANSESOPHAGEAL ECHOCARDIOGRAM (TEE) (N/A) Mobilize Diuresis Diabetes control rectal tube placed by GI- check KUB in am  NPO- TPN ordered for tomorrow   LOS: 5 days    Tharon Aquas Trigt III 08/24/2015

## 2015-08-25 ENCOUNTER — Inpatient Hospital Stay (HOSPITAL_COMMUNITY): Payer: Medicare Other

## 2015-08-25 ENCOUNTER — Encounter (HOSPITAL_COMMUNITY)
Admission: RE | Disposition: A | Discharge status: Rehabilitation Facility | Payer: Self-pay | Source: Ambulatory Visit | Attending: Cardiothoracic Surgery

## 2015-08-25 ENCOUNTER — Inpatient Hospital Stay (HOSPITAL_COMMUNITY): Payer: Medicare Other | Admitting: Certified Registered Nurse Anesthetist

## 2015-08-25 ENCOUNTER — Encounter (HOSPITAL_COMMUNITY): Payer: Self-pay | Admitting: Critical Care Medicine

## 2015-08-25 HISTORY — PX: PARTIAL COLECTOMY: SHX5273

## 2015-08-25 HISTORY — PX: LAPAROTOMY: SHX154

## 2015-08-25 LAB — PROTIME-INR
INR: 1.29 (ref 0.00–1.49)
PROTHROMBIN TIME: 16.3 s — AB (ref 11.6–15.2)

## 2015-08-25 LAB — CBC
HCT: 23.7 % — ABNORMAL LOW (ref 39.0–52.0)
HCT: 29.9 % — ABNORMAL LOW (ref 39.0–52.0)
Hemoglobin: 8.2 g/dL — ABNORMAL LOW (ref 13.0–17.0)
Hemoglobin: 9.8 g/dL — ABNORMAL LOW (ref 13.0–17.0)
MCH: 34 pg (ref 26.0–34.0)
MCH: 31.5 pg (ref 26.0–34.0)
MCHC: 34.6 g/dL (ref 30.0–36.0)
MCHC: 32.8 g/dL (ref 30.0–36.0)
MCV: 98.3 fL (ref 78.0–100.0)
MCV: 96.1 fL (ref 78.0–100.0)
Platelets: 163 10*3/uL (ref 150–400)
Platelets: 198 10*3/uL (ref 150–400)
RBC: 2.41 MIL/uL — ABNORMAL LOW (ref 4.22–5.81)
RBC: 3.11 MIL/uL — ABNORMAL LOW (ref 4.22–5.81)
RDW: 14.5 % (ref 11.5–15.5)
RDW: 15.2 % (ref 11.5–15.5)
WBC: 10.6 10*3/uL — ABNORMAL HIGH (ref 4.0–10.5)
WBC: 7.8 10*3/uL (ref 4.0–10.5)

## 2015-08-25 LAB — POCT I-STAT 3, ART BLOOD GAS (G3+)
Acid-Base Excess: 1 mmol/L (ref 0.0–2.0)
Acid-base deficit: 1 mmol/L (ref 0.0–2.0)
Bicarbonate: 25.6 mEq/L — ABNORMAL HIGH (ref 20.0–24.0)
Bicarbonate: 26.5 mEq/L — ABNORMAL HIGH (ref 20.0–24.0)
O2 Saturation: 100 %
O2 Saturation: 67 %
PCO2 ART: 44.1 mmHg (ref 35.0–45.0)
PCO2 ART: 50.9 mmHg — AB (ref 35.0–45.0)
PH ART: 7.387 (ref 7.350–7.450)
PO2 ART: 215 mmHg — AB (ref 80.0–100.0)
PO2 ART: 39 mmHg — AB (ref 80.0–100.0)
Patient temperature: 98.7
Patient temperature: 98.7
TCO2: 27 mmol/L (ref 0–100)
TCO2: 28 mmol/L (ref 0–100)
pH, Arterial: 7.309 — ABNORMAL LOW (ref 7.350–7.450)

## 2015-08-25 LAB — COMPREHENSIVE METABOLIC PANEL
ALT: 17 U/L (ref 17–63)
AST: 27 U/L (ref 15–41)
Albumin: 2.6 g/dL — ABNORMAL LOW (ref 3.5–5.0)
Alkaline Phosphatase: 32 U/L — ABNORMAL LOW (ref 38–126)
Anion gap: 10 (ref 5–15)
BUN: 11 mg/dL (ref 6–20)
CO2: 25 mmol/L (ref 22–32)
Calcium: 8 mg/dL — ABNORMAL LOW (ref 8.9–10.3)
Chloride: 95 mmol/L — ABNORMAL LOW (ref 101–111)
Creatinine, Ser: 0.88 mg/dL (ref 0.61–1.24)
GFR calc Af Amer: >60 mL/min (ref >60)
GFR calc non Af Amer: >60 mL/min (ref >60)
Glucose, Bld: 98 mg/dL (ref 65–99)
Potassium: 4.1 mmol/L (ref 3.5–5.1)
Sodium: 130 mmol/L — ABNORMAL LOW (ref 135–145)
Total Bilirubin: 1 mg/dL (ref 0.3–1.2)
Total Protein: 5.2 g/dL — ABNORMAL LOW (ref 6.5–8.1)

## 2015-08-25 LAB — GLUCOSE, CAPILLARY
GLUCOSE-CAPILLARY: 107 mg/dL — AB (ref 65–99)
GLUCOSE-CAPILLARY: 219 mg/dL — AB (ref 65–99)
Glucose-Capillary: 98 mg/dL (ref 65–99)
Glucose-Capillary: 97 mg/dL (ref 65–99)

## 2015-08-25 LAB — DIFFERENTIAL
Basophils Absolute: 0 10*3/uL (ref 0.0–0.1)
Basophils Relative: 0 %
Eosinophils Absolute: 0.1 10*3/uL (ref 0.0–0.7)
Eosinophils Relative: 1 %
Lymphocytes Relative: 9 %
Lymphs Abs: 0.9 10*3/uL (ref 0.7–4.0)
Monocytes Absolute: 1.2 10*3/uL — ABNORMAL HIGH (ref 0.1–1.0)
Monocytes Relative: 11 %
Neutro Abs: 8.3 10*3/uL — ABNORMAL HIGH (ref 1.7–7.7)
Neutrophils Relative %: 79 %

## 2015-08-25 LAB — BASIC METABOLIC PANEL
Anion gap: 7 (ref 5–15)
BUN: 12 mg/dL (ref 6–20)
CO2: 24 mmol/L (ref 22–32)
Calcium: 7.6 mg/dL — ABNORMAL LOW (ref 8.9–10.3)
Chloride: 100 mmol/L — ABNORMAL LOW (ref 101–111)
Creatinine, Ser: 0.96 mg/dL (ref 0.61–1.24)
GFR calc Af Amer: >60 mL/min (ref >60)
GFR calc non Af Amer: >60 mL/min (ref >60)
Glucose, Bld: 138 mg/dL — ABNORMAL HIGH (ref 65–99)
Potassium: 4.5 mmol/L (ref 3.5–5.1)
Sodium: 131 mmol/L — ABNORMAL LOW (ref 135–145)

## 2015-08-25 LAB — PREPARE RBC (CROSSMATCH)

## 2015-08-25 LAB — MAGNESIUM: Magnesium: 2.2 mg/dL (ref 1.7–2.4)

## 2015-08-25 LAB — TRIGLYCERIDES: Triglycerides: 83 mg/dL (ref <150)

## 2015-08-25 LAB — PREALBUMIN: Prealbumin: 7.1 mg/dL — ABNORMAL LOW (ref 18–38)

## 2015-08-25 LAB — PHOSPHORUS: Phosphorus: 2.2 mg/dL — ABNORMAL LOW (ref 2.5–4.6)

## 2015-08-25 SURGERY — LAPAROTOMY, EXPLORATORY
Anesthesia: General | Site: Abdomen

## 2015-08-25 MED ORDER — DEXMEDETOMIDINE HCL IN NACL 200 MCG/50ML IV SOLN
0.0000 ug/kg/h | INTRAVENOUS | Status: DC
  Administered 2015-08-25: 0.7 ug/kg/h via INTRAVENOUS
  Administered 2015-08-25: 1 ug/kg/h via INTRAVENOUS
  Filled 2015-08-25 (×2): qty 50

## 2015-08-25 MED ORDER — FENTANYL CITRATE (PF) 100 MCG/2ML IJ SOLN
INTRAMUSCULAR | Status: DC | PRN
  Administered 2015-08-25: 250 ug via INTRAVENOUS

## 2015-08-25 MED ORDER — MIDAZOLAM HCL 2 MG/2ML IJ SOLN
1.0000 mg | INTRAMUSCULAR | Status: DC | PRN
  Filled 2015-08-25 (×3): qty 2

## 2015-08-25 MED ORDER — HALOPERIDOL LACTATE 5 MG/ML IJ SOLN: 5.0000 mg | Freq: Four times a day (QID) | INTRAMUSCULAR | Status: DC | PRN

## 2015-08-25 MED ORDER — ROCURONIUM BROMIDE 100 MG/10ML IV SOLN
INTRAVENOUS | Status: DC | PRN
  Administered 2015-08-25: 30 mg via INTRAVENOUS
  Administered 2015-08-25: 20 mg via INTRAVENOUS

## 2015-08-25 MED ORDER — DEXMEDETOMIDINE HCL IN NACL 200 MCG/50ML IV SOLN
INTRAVENOUS | Status: DC | PRN
  Administered 2015-08-25: 0.7 ug/kg/h via INTRAVENOUS

## 2015-08-25 MED ORDER — MIDAZOLAM HCL 2 MG/2ML IJ SOLN
INTRAMUSCULAR | Status: AC
  Filled 2015-08-25: qty 2

## 2015-08-25 MED ORDER — FENTANYL CITRATE (PF) 250 MCG/5ML IJ SOLN
INTRAMUSCULAR | Status: AC
  Filled 2015-08-25: qty 5

## 2015-08-25 MED ORDER — LIDOCAINE 2% (20 MG/ML) 5 ML SYRINGE
INTRAMUSCULAR | Status: AC
  Filled 2015-08-25: qty 10

## 2015-08-25 MED ORDER — GLYCOPYRROLATE 0.2 MG/ML IV SOSY
PREFILLED_SYRINGE | INTRAVENOUS | Status: AC
  Filled 2015-08-25: qty 6

## 2015-08-25 MED ORDER — DEXMEDETOMIDINE HCL IN NACL 400 MCG/100ML IV SOLN
0.4000 ug/kg/h | INTRAVENOUS | Status: DC
  Administered 2015-08-25: 1 ug/kg/h via INTRAVENOUS
  Administered 2015-08-26: 0.6 ug/kg/h via INTRAVENOUS
  Filled 2015-08-25 (×4): qty 100

## 2015-08-25 MED ORDER — FAT EMULSION 20 % IV EMUL
240.0000 mL | INTRAVENOUS | Status: AC
  Administered 2015-08-25: 240 mL via INTRAVENOUS
  Filled 2015-08-25: qty 250

## 2015-08-25 MED ORDER — ROCURONIUM BROMIDE 50 MG/5ML IV SOLN
INTRAVENOUS | Status: AC
  Filled 2015-08-25: qty 1

## 2015-08-25 MED ORDER — PANTOPRAZOLE SODIUM 40 MG IV SOLR
40.0000 mg | INTRAVENOUS | Status: DC
  Administered 2015-08-25 – 2015-09-06 (×13): 40 mg via INTRAVENOUS
  Filled 2015-08-25 (×13): qty 40

## 2015-08-25 MED ORDER — DEXTROSE 5 % IV SOLN
0.0000 ug/min | INTRAVENOUS | Status: DC
  Administered 2015-08-25: 5 ug/min via INTRAVENOUS
  Filled 2015-08-25: qty 1

## 2015-08-25 MED ORDER — PHENYLEPHRINE 40 MCG/ML (10ML) SYRINGE FOR IV PUSH (FOR BLOOD PRESSURE SUPPORT)
PREFILLED_SYRINGE | INTRAVENOUS | Status: AC
  Filled 2015-08-25: qty 20

## 2015-08-25 MED ORDER — ESMOLOL HCL 100 MG/10ML IV SOLN
INTRAVENOUS | Status: DC | PRN
  Administered 2015-08-25: 10 mg via INTRAVENOUS
  Administered 2015-08-25: 30 mg via INTRAVENOUS
  Administered 2015-08-25 (×2): 10 mg via INTRAVENOUS

## 2015-08-25 MED ORDER — ETOMIDATE 2 MG/ML IV SOLN
INTRAVENOUS | Status: DC | PRN
  Administered 2015-08-25: 10 mg via INTRAVENOUS
  Administered 2015-08-25: 16 mg via INTRAVENOUS

## 2015-08-25 MED ORDER — GLYCOPYRROLATE 0.2 MG/ML IJ SOLN
INTRAMUSCULAR | Status: DC | PRN
  Administered 2015-08-25: 0.6 mg via INTRAVENOUS
  Administered 2015-08-25: 0.2 mg via INTRAVENOUS

## 2015-08-25 MED ORDER — PROPOFOL 10 MG/ML IV BOLUS
INTRAVENOUS | Status: AC
  Filled 2015-08-25: qty 20

## 2015-08-25 MED ORDER — LACTATED RINGERS IV SOLN
INTRAVENOUS | Status: DC | PRN
  Administered 2015-08-25: 11:00:00 via INTRAVENOUS

## 2015-08-25 MED ORDER — SUCCINYLCHOLINE CHLORIDE 200 MG/10ML IV SOSY
PREFILLED_SYRINGE | INTRAVENOUS | Status: AC
  Filled 2015-08-25: qty 10

## 2015-08-25 MED ORDER — PHENYLEPHRINE HCL 10 MG/ML IJ SOLN
INTRAMUSCULAR | Status: DC | PRN
  Administered 2015-08-25: 80 ug via INTRAVENOUS
  Administered 2015-08-25: 200 ug via INTRAVENOUS
  Administered 2015-08-25: 120 ug via INTRAVENOUS
  Administered 2015-08-25: 200 ug via INTRAVENOUS
  Administered 2015-08-25: 80 ug via INTRAVENOUS

## 2015-08-25 MED ORDER — DEXAMETHASONE SODIUM PHOSPHATE 10 MG/ML IJ SOLN
INTRAMUSCULAR | Status: DC | PRN
  Administered 2015-08-25: 10 mg via INTRAVENOUS

## 2015-08-25 MED ORDER — MIDAZOLAM HCL 2 MG/2ML IJ SOLN
1.0000 mg | INTRAMUSCULAR | Status: DC | PRN
  Administered 2015-08-25 (×2): 1 mg via INTRAVENOUS

## 2015-08-25 MED ORDER — SUCCINYLCHOLINE CHLORIDE 20 MG/ML IJ SOLN
INTRAMUSCULAR | Status: DC | PRN
  Administered 2015-08-25: 100 mg via INTRAVENOUS
  Administered 2015-08-25: 120 mg via INTRAVENOUS

## 2015-08-25 MED ORDER — LACTATED RINGERS IV SOLN
INTRAVENOUS | Status: DC | PRN
  Administered 2015-08-25: 12:00:00 via INTRAVENOUS

## 2015-08-25 MED ORDER — ONDANSETRON HCL 4 MG/2ML IJ SOLN
INTRAMUSCULAR | Status: DC | PRN
  Administered 2015-08-25: 4 mg via INTRAVENOUS

## 2015-08-25 MED ORDER — MIDAZOLAM HCL 5 MG/5ML IJ SOLN
INTRAMUSCULAR | Status: DC | PRN
  Administered 2015-08-25: 2 mg via INTRAVENOUS

## 2015-08-25 MED ORDER — FENTANYL BOLUS VIA INFUSION
25.0000 ug | INTRAVENOUS | Status: DC | PRN
  Filled 2015-08-25 (×2): qty 25

## 2015-08-25 MED ORDER — CEFOTETAN DISODIUM-DEXTROSE 2-2.08 GM-% IV SOLR
INTRAVENOUS | Status: AC
  Filled 2015-08-25: qty 50

## 2015-08-25 MED ORDER — PHENYLEPHRINE HCL 10 MG/ML IJ SOLN
10.0000 mg | INTRAVENOUS | Status: DC | PRN
  Administered 2015-08-25: 50 ug/min via INTRAVENOUS

## 2015-08-25 MED ORDER — FENTANYL CITRATE (PF) 100 MCG/2ML IJ SOLN
50.0000 ug | Freq: Once | INTRAMUSCULAR | Status: AC
  Administered 2015-08-26: 50 ug via INTRAVENOUS
  Filled 2015-08-25: qty 2

## 2015-08-25 MED ORDER — CEFOTETAN DISODIUM-DEXTROSE 2-2.08 GM-% IV SOLR
2.0000 g | Freq: Once | INTRAVENOUS | Status: AC
  Administered 2015-08-25: 2 g via INTRAVENOUS

## 2015-08-25 MED ORDER — DEXTROSE-NACL 5-0.9 % IV SOLN
INTRAVENOUS | Status: DC
  Administered 2015-08-25: 16:00:00 via INTRAVENOUS

## 2015-08-25 MED ORDER — FENTANYL CITRATE (PF) 2500 MCG/50ML IJ SOLN
25.0000 ug/h | INTRAMUSCULAR | Status: DC
  Administered 2015-08-25: 50 ug/h via INTRAVENOUS
  Filled 2015-08-25 (×2): qty 50

## 2015-08-25 MED ORDER — NEOSTIGMINE METHYLSULFATE 10 MG/10ML IV SOLN
INTRAVENOUS | Status: DC | PRN
  Administered 2015-08-25: 4 mg via INTRAVENOUS
  Administered 2015-08-25: 1 mg via INTRAVENOUS

## 2015-08-25 MED ORDER — 0.9 % SODIUM CHLORIDE (POUR BTL) OPTIME
TOPICAL | Status: DC | PRN
  Administered 2015-08-25 (×2): 1000 mL

## 2015-08-25 MED ORDER — NEOSTIGMINE METHYLSULFATE 5 MG/5ML IV SOSY
PREFILLED_SYRINGE | INTRAVENOUS | Status: AC
  Filled 2015-08-25: qty 5

## 2015-08-25 MED ORDER — FUROSEMIDE 10 MG/ML IJ SOLN
40.0000 mg | Freq: Every day | INTRAMUSCULAR | Status: DC
  Administered 2015-08-26 – 2015-09-06 (×12): 40 mg via INTRAVENOUS
  Filled 2015-08-25 (×12): qty 4

## 2015-08-25 MED ORDER — TRACE MINERALS CR-CU-MN-SE-ZN 10-1000-500-60 MCG/ML IV SOLN
INTRAVENOUS | Status: AC
  Administered 2015-08-25: 18:00:00 via INTRAVENOUS
  Filled 2015-08-25: qty 960

## 2015-08-25 MED ORDER — ALBUMIN HUMAN 25 % IV SOLN
12.5000 g | Freq: Once | INTRAVENOUS | Status: AC
  Administered 2015-08-25: 12.5 g via INTRAVENOUS

## 2015-08-25 SURGICAL SUPPLY — 53 items
BLADE SURG ROTATE 9660 (MISCELLANEOUS) IMPLANT
CANISTER SUCTION 2500CC (MISCELLANEOUS) ×3 IMPLANT
CHLORAPREP W/TINT 26ML (MISCELLANEOUS) ×3 IMPLANT
COVER SURGICAL LIGHT HANDLE (MISCELLANEOUS) ×6 IMPLANT
DRAPE LAPAROSCOPIC ABDOMINAL (DRAPES) ×3 IMPLANT
DRAPE WARM FLUID 44X44 (DRAPE) ×3 IMPLANT
DRSG OPSITE POSTOP 4X10 (GAUZE/BANDAGES/DRESSINGS) ×3 IMPLANT
DRSG OPSITE POSTOP 4X8 (GAUZE/BANDAGES/DRESSINGS) IMPLANT
DRSG TEGADERM 2-3/8X2-3/4 SM (GAUZE/BANDAGES/DRESSINGS) ×6 IMPLANT
ELECT BLADE 6.5 EXT (BLADE) IMPLANT
ELECT CAUTERY BLADE 6.4 (BLADE) ×3 IMPLANT
ELECT REM PT RETURN 9FT ADLT (ELECTROSURGICAL) ×3
ELECTRODE REM PT RTRN 9FT ADLT (ELECTROSURGICAL) ×1 IMPLANT
GLOVE BIO SURGEON STRL SZ 6.5 (GLOVE) ×4 IMPLANT
GLOVE BIO SURGEON STRL SZ7.5 (GLOVE) ×9 IMPLANT
GLOVE BIO SURGEONS STRL SZ 6.5 (GLOVE) ×2
GLOVE BIOGEL PI IND STRL 6.5 (GLOVE) ×1 IMPLANT
GLOVE BIOGEL PI IND STRL 7.0 (GLOVE) ×2 IMPLANT
GLOVE BIOGEL PI IND STRL 8 (GLOVE) ×3 IMPLANT
GLOVE BIOGEL PI INDICATOR 6.5 (GLOVE) ×2
GLOVE BIOGEL PI INDICATOR 7.0 (GLOVE) ×4
GLOVE BIOGEL PI INDICATOR 8 (GLOVE) ×6
GLOVE ECLIPSE 7.5 STRL STRAW (GLOVE) ×6 IMPLANT
GOWN STRL REUS W/ TWL LRG LVL3 (GOWN DISPOSABLE) ×4 IMPLANT
GOWN STRL REUS W/ TWL XL LVL3 (GOWN DISPOSABLE) ×2 IMPLANT
GOWN STRL REUS W/TWL LRG LVL3 (GOWN DISPOSABLE) ×8
GOWN STRL REUS W/TWL XL LVL3 (GOWN DISPOSABLE) ×4
KIT BASIN OR (CUSTOM PROCEDURE TRAY) ×3 IMPLANT
KIT ROOM TURNOVER OR (KITS) ×3 IMPLANT
LIGASURE IMPACT 36 18CM CVD LR (INSTRUMENTS) ×3 IMPLANT
NS IRRIG 1000ML POUR BTL (IV SOLUTION) ×6 IMPLANT
PACK GENERAL/GYN (CUSTOM PROCEDURE TRAY) ×3 IMPLANT
PAD ARMBOARD 7.5X6 YLW CONV (MISCELLANEOUS) ×3 IMPLANT
RELOAD PROXIMATE 75MM BLUE (ENDOMECHANICALS) ×6 IMPLANT
SPECIMEN JAR LARGE (MISCELLANEOUS) IMPLANT
SPONGE GAUZE 4X4 12PLY STER LF (GAUZE/BANDAGES/DRESSINGS) ×3 IMPLANT
SPONGE LAP 18X18 X RAY DECT (DISPOSABLE) ×6 IMPLANT
STAPLER GUN LINEAR PROX 60 (STAPLE) ×3 IMPLANT
STAPLER PROXIMATE 75MM BLUE (STAPLE) ×3 IMPLANT
STAPLER VISISTAT 35W (STAPLE) ×3 IMPLANT
SUCTION POOLE TIP (SUCTIONS) ×3 IMPLANT
SUT PDS AB 1 TP1 96 (SUTURE) ×6 IMPLANT
SUT SILK 2 0 SH CR/8 (SUTURE) ×3 IMPLANT
SUT SILK 2 0 TIES 10X30 (SUTURE) ×6 IMPLANT
SUT SILK 3 0 SH CR/8 (SUTURE) ×12 IMPLANT
SUT SILK 3 0 TIES 10X30 (SUTURE) ×6 IMPLANT
TAPE CLOTH SURG 4X10 WHT LF (GAUZE/BANDAGES/DRESSINGS) ×3 IMPLANT
TOWEL OR 17X24 6PK STRL BLUE (TOWEL DISPOSABLE) ×3 IMPLANT
TOWEL OR 17X26 10 PK STRL BLUE (TOWEL DISPOSABLE) ×3 IMPLANT
TRAY FOLEY CATH 16FRSI W/METER (SET/KITS/TRAYS/PACK) ×3 IMPLANT
TUBE CONNECTING 12'X1/4 (SUCTIONS) ×1
TUBE CONNECTING 12X1/4 (SUCTIONS) ×2 IMPLANT
YANKAUER SUCT BULB TIP NO VENT (SUCTIONS) ×3 IMPLANT

## 2015-08-25 NOTE — Transfer of Care (Signed)
Immediate Anesthesia Transfer of Care Note  Patient: Victor Diaz  Procedure(s) Performed: Procedure(s): EXPLORATORY LAPAROTOMY (N/A) ASCENDING AND TRANSVERSE COLECTOMY (N/A)  Patient Location: SICU  Anesthesia Type:General  Level of Consciousness: Patient remains intubated per anesthesia plan  Airway & Oxygen Therapy: Patient remains intubated per anesthesia plan and Patient placed on Ventilator (see vital sign flow sheet for setting)  Post-op Assessment: Report given to RN and Post -op Vital signs reviewed and stable  Post vital signs: Reviewed and stable  Last Vitals:  Filed Vitals:   08/25/15 0840 08/25/15 0937  BP:  145/82  Pulse:  100  Temp: 36.7 C   Resp:      Last Pain:  Filed Vitals:   08/25/15 0937  PainSc: Asleep      Patients Stated Pain Goal: 0 ([id number])  Complications: No apparent anesthesia complications

## 2015-08-25 NOTE — Progress Notes (Signed)
Dr. Jenita Seashore requests 1 unit of blood be at bedside to take back with pt to OR which was done.

## 2015-08-25 NOTE — Progress Notes (Signed)
[facility] BRIEF SICU PROGRESS NOTE  5 Days Post-Op Procedure(s) (LRB): CORONARY ARTERY BYPASS GRAFTING (CABG) TIMES FOUR UTILIZING THE LEFT INTERNAL MAMMARY ARTERY AND ENDOSCOPICALLY HARVESTED RIGHT SAPHENEOUS VEINS. CLIPPING OF ATRIAL APPENDAGE (N/A) TRANSESOPHAGEAL ECHOCARDIOGRAM (TEE) (N/A)  Day of Surgery  S/P Procedure(s) (LRB): EXPLORATORY LAPAROTOMY (N/A) ASCENDING AND TRANSVERSE COLECTOMY (N/A)   Intubated on vent NSR - AAI paced rhythm Stable hemodynamics, no drips Excellent UOP  Plan: Continue current plan  Rexene Alberts, MD 08/25/2015 10:43 PM

## 2015-08-25 NOTE — Progress Notes (Signed)
Rehab admissions - I am following for potential acute inpatient rehab admission.  Noted patient had surgery today.  Will follow progress for now.  Call me for questions.  RC:[phone]

## 2015-08-25 NOTE — Progress Notes (Signed)
Ok to give PO Lopressor 12.5mg  now per Dr. Jenita Seashore.

## 2015-08-25 NOTE — Op Note (Signed)
08/25/2015  1:29 PM  PATIENT:  Victor Diaz  70 y.o. male  PRE-OPERATIVE DIAGNOSIS:  cecal volvulus  POST-OPERATIVE DIAGNOSIS:  cecal volvulus  PROCEDURE:  Procedure(s): EXPLORATORY LAPAROTOMY (N/A) RIGHT HEMI-COLECTOMY WITH ANASTAMOSIS(N/A)  SURGEON:  Surgeon(s) and Role:    * Ralene Ok, MD - Primary    * Reino Kent, MD - Fellow  ANESTHESIA:   general  EBL:  Total I/O In: 1612.5 [I.V.:1277.5; Blood:335] Out: 1575 [id number]; Blood:100]  BLOOD ADMINISTERED:none  DRAINS: none   LOCAL MEDICATIONS USED:  NONE  SPECIMEN:  Source of Specimen:  Right colon  DISPOSITION OF SPECIMEN:  PATHOLOGY  COUNTS:  YES  TOURNIQUET:  * No tourniquets in log *  DICTATION: .Dragon Dictation ESTIMATED PATIENT WAS CONSENTED PATIENT WAS TAKEN BACK TO THE OPERATING ROOM AND PLACED IN THE SUPINE POSITION WITH BILATERAL scdS IN PLACE. hE UNDERWENT GENERAL ENDOTRACHEAL ANESTHESIA. fOLEY CATHETER WAS PLACED. pATIENT WAS PREPPED AND DRAPED IN A STERILE FASHION. A TIMEOUT WAS CALLED, ALL FACTS WERE VERIFIED.   The midline incision was made using a 10 blade. Electrocautery was used to maintain hemostasis and dissection taken down to the anterior fascia. The fascia was elevated. The peritoneum was entered bluntly.  At this time is apparent there was a very distended cecum in the left upper quadrant. The fascia was then extended to the length of the skin incision. At this time the cecum was easily delivered up through the incision. The white line of Toldt was incised and bluntly dissected away from the retroperitoneum. The duodenum was identified and dissected away from the mesentery of the right colon. At this time a section of the terminal ileum was chosen for transection. A mesenteric window was made. The 75 GIA stapler was used to transect the terminal ileum. A LigaSure device was used to ligate the mesentery. It was thickened at the ileocolic vessel area. An area just proximal to the  middle colic vessels was chosen for the transverse colon transection. The omentum was taken off of the transverse colon. A 75 blue load GIA stapler was used to transect colon. The device was used to complete the mesenteric ligation.  At this time a corner of the staple line of each section of bowel was removed. A anastomosis was created using a 75 GIA blue load stapler. The common enterotomy was then reapproximated using a 90 TA stapler. The staple lines were offset. An apex stitch was placed using 3-0 silk. The mesenteric defect was reapproximated using 3-0 silk's and a figure-of-eight fashion. Upon visualizing this the line of the anastomosis did not appear compromised however was not completely pink. This area of the colonic portion of the staple line was imbricated using 3-0 silk's and a Lembert fashion. The remaining portion of the stapled colon appeared healthy and pink.  At this time the drapes, gowns, gloves were replaced. At this time the abdominal cavity was irrigated out with sterile saline. The omentum was brought over the anastomosis as well as the midline wound. The fascia was then reapproximated with #1 PDS in a standard running fashion. The subcutaneous skin was irrigated out with sterile saline. Skin was reapproximated loosely using staples. The wound was dressed with honeycomb staples.  The patient how the procedure well was taken to the recovery room in stable condition.  PLAN OF CARE: Admit to inpatient   PATIENT DISPOSITION:  PACU - hemodynamically stable.   Delay start of Pharmacological VTE agent (>24hrs) due to surgical blood loss or risk of bleeding:  not applicable

## 2015-08-25 NOTE — Consult Note (Signed)
Reason for Consult:Cecal Distention Referring Physician: Vernell Leep, MD  Victor Diaz is an 70 y.o. male.  HPI: 70 yo M POD 5 s/p 3 vessel CABG for CAD who over the past two days has noticed increasing abdominal distention.  He states that he has not had passage of flatus in two days.  Yesterday, imaging of the abdomen demonstrated an enlarged colon with distention in the cecum to 15cm.  GI was consulted and attempt at placement of a rectal tube was done.  Minimal stool was evacuated.  Today on imaging, persistent dilation was noted to 15cm and Surgery was consulted over concern for possible volvulus.    Patient denies any pain this morning, although endorses complaint of distention.  Denies passage of flatus or significant BM.  Has been tolerating NPO without NG.  No nausea or vomiting.  Past Medical History  Diagnosis Date  . Allergy   . Gout   . Hyperlipidemia   . Alcohol abuse   . Hemothorax on right   . Lisfranc's dislocation 10/20/2011  . Right fibular fracture 10/20/2011  . Essential hypertension   . PSVT (paroxysmal supraventricular tachycardia) ([place])   . Paroxysmal atrial flutter ([facility])     Poor anticoagulation candidate with active alcohol abuse and syncope  . Subclavian artery stenosis, left     Status post stent placement by Dr. Trula Slade 10/2014    Past Surgical History  Procedure Laterality Date  . Appendectomy    . Hernia repair    . Tonsilectomy, adenoidectomy, bilateral myringotomy and tubes    . Revision total hip arthroplasty  08/28/2008  . Partial hip arthroplasty Left 2010  . Peripheral vascular catheterization N/A 11/11/2014    Procedure: Aortic Arch Angiography/Left Subclavion Stent;  Surgeon: Serafina Mitchell, MD;  Location: [facility];  Service: Cardiovascular;  Laterality: N/A;  . Cardiac catheterization N/A 08/19/2015    Procedure: Left Heart Cath and Cors/Grafts Angiography;  Surgeon: Jettie Booze, MD;  Location: [facility];  Service:  Cardiovascular;  Laterality: N/A;  . Coronary artery bypass graft N/A 08/20/2015    Procedure: CORONARY ARTERY BYPASS GRAFTING (CABG) TIMES FOUR  UTILIZING THE LEFT INTERNAL MAMMARY ARTERY AND ENDOSCOPICALLY HARVESTED RIGHT SAPHENEOUS VEINS. CLIPPING OF ATRIAL APPENDAGE;  Surgeon: Ivin Poot, MD;  Location: [facility];  Service: Open Heart Surgery;  Laterality: N/A;  . Tee without cardioversion N/A 08/20/2015    Procedure: TRANSESOPHAGEAL ECHOCARDIOGRAM (TEE);  Surgeon: Ivin Poot, MD;  Location: [facility];  Service: Open Heart Surgery;  Laterality: N/A;    Family History  Problem Relation Age of Onset  . Arthritis Mother   . Cerebral aneurysm Mother   . Tuberculosis Father     Social History:  reports that he quit smoking about 46 years ago. His smoking use included Cigarettes. He has a 10 pack-year smoking history. He has never used smokeless tobacco. He reports that he drinks about 37.8 oz of alcohol per week. He reports that he does not use illicit drugs.  Allergies:  Allergies  Allergen Reactions  . Atorvastatin Anaphylaxis    LIPITOR -  facial swelling    Medications: I have reviewed the patient's current medications.  Results for orders placed or performed during the hospital encounter of 08/19/15 (from the past 48 hour(s))  Glucose, capillary     Status: Abnormal   Collection Time: 08/23/15  8:28 AM  Result Value Ref Range   Glucose-Capillary 149 (H) 65 - 99 mg/dL  Comment 1 Notify RN   Glucose, capillary     Status: Abnormal   Collection Time: 08/23/15 12:24 PM  Result Value Ref Range   Glucose-Capillary 109 (H) 65 - 99 mg/dL   Comment 1 Notify RN   I-STAT, chem 8     Status: Abnormal   Collection Time: 08/23/15  3:53 PM  Result Value Ref Range   Sodium 124 (L) 135 - 145 mmol/L   Potassium 4.5 3.5 - 5.1 mmol/L   Chloride 88 (L) 101 - 111 mmol/L   BUN 11 6 - 20 mg/dL   Creatinine, Ser 0.90 0.61 - 1.24 mg/dL   Glucose, Bld 107 (H) 65 - 99 mg/dL   Calcium, Ion 1.11  (L) 1.13 - 1.30 mmol/L   TCO2 23 0 - 100 mmol/L   Hemoglobin 9.2 (L) 13.0 - 17.0 g/dL   HCT 27.0 (L) 39.0 - 52.0 %  Glucose, capillary     Status: None   Collection Time: 08/23/15  5:24 PM  Result Value Ref Range   Glucose-Capillary 98 65 - 99 mg/dL  Glucose, capillary     Status: Abnormal   Collection Time: 08/23/15  7:46 PM  Result Value Ref Range   Glucose-Capillary 145 (H) 65 - 99 mg/dL   Comment 1 Notify RN    Comment 2 Document in Chart   Glucose, capillary     Status: Abnormal   Collection Time: 08/23/15  9:39 PM  Result Value Ref Range   Glucose-Capillary 124 (H) 65 - 99 mg/dL   Comment 1 Notify RN    Comment 2 Document in Chart   Comprehensive metabolic panel     Status: Abnormal   Collection Time: 08/24/15  4:00 AM  Result Value Ref Range   Sodium 124 (L) 135 - 145 mmol/L   Potassium 4.4 3.5 - 5.1 mmol/L   Chloride 90 (L) 101 - 111 mmol/L   CO2 24 22 - 32 mmol/L   Glucose, Bld 99 65 - 99 mg/dL   BUN 11 6 - 20 mg/dL   Creatinine, Ser 1.01 0.61 - 1.24 mg/dL   Calcium 7.9 (L) 8.9 - 10.3 mg/dL   Total Protein 5.8 (L) 6.5 - 8.1 g/dL   Albumin 2.8 (L) 3.5 - 5.0 g/dL   AST 32 15 - 41 U/L   ALT 19 17 - 63 U/L   Alkaline Phosphatase 32 (L) 38 - 126 U/L   Total Bilirubin 0.9 0.3 - 1.2 mg/dL   GFR calc non Af Amer >60 >60 mL/min   GFR calc Af Amer >60 >60 mL/min    Comment: (NOTE) The eGFR has been calculated using the CKD EPI equation. This calculation has not been validated in all clinical situations. eGFR's persistently <60 mL/min signify possible Chronic Kidney Disease.    Anion gap 10 5 - 15  CBC     Status: Abnormal   Collection Time: 08/24/15  4:00 AM  Result Value Ref Range   WBC 10.0 4.0 - 10.5 K/uL   RBC 2.51 (L) 4.22 - 5.81 MIL/uL   Hemoglobin 8.3 (L) 13.0 - 17.0 g/dL   HCT 24.3 (L) 39.0 - 52.0 %   MCV 96.8 78.0 - 100.0 fL   MCH 33.1 26.0 - 34.0 pg   MCHC 34.2 30.0 - 36.0 g/dL   RDW 14.3 11.5 - 15.5 %   Platelets 121 (L) 150 - 400 K/uL  Glucose,  capillary     Status: Abnormal   Collection Time: 08/24/15  8:29 AM  Result Value Ref Range   Glucose-Capillary 105 (H) 65 - 99 mg/dL  Glucose, capillary     Status: Abnormal   Collection Time: 08/24/15 12:34 PM  Result Value Ref Range   Glucose-Capillary 133 (H) 65 - 99 mg/dL   Comment 1 Notify RN   Glucose, capillary     Status: Abnormal   Collection Time: 08/24/15  4:16 PM  Result Value Ref Range   Glucose-Capillary 254 (H) 65 - 99 mg/dL   Comment 1 Notify RN   I-STAT, chem 8     Status: Abnormal   Collection Time: 08/24/15  4:17 PM  Result Value Ref Range   Sodium 125 (L) 135 - 145 mmol/L   Potassium 5.1 3.5 - 5.1 mmol/L   Chloride 89 (L) 101 - 111 mmol/L   BUN 12 6 - 20 mg/dL   Creatinine, Ser 0.90 0.61 - 1.24 mg/dL   Glucose, Bld 250 (H) 65 - 99 mg/dL   Calcium, Ion 1.05 (L) 1.13 - 1.30 mmol/L   TCO2 25 0 - 100 mmol/L   Hemoglobin 8.8 (L) 13.0 - 17.0 g/dL   HCT 26.0 (L) 39.0 - 52.0 %  Glucose, capillary     Status: Abnormal   Collection Time: 08/24/15  5:40 PM  Result Value Ref Range   Glucose-Capillary 104 (H) 65 - 99 mg/dL  Glucose, capillary     Status: None   Collection Time: 08/24/15  7:48 PM  Result Value Ref Range   Glucose-Capillary 98 65 - 99 mg/dL   Comment 1 Venous Specimen    Comment 2 Notify RN   Glucose, capillary     Status: Abnormal   Collection Time: 08/24/15 10:30 PM  Result Value Ref Range   Glucose-Capillary 102 (H) 65 - 99 mg/dL   Comment 1 Capillary Specimen   Glucose, capillary     Status: Abnormal   Collection Time: 08/24/15 11:18 PM  Result Value Ref Range   Glucose-Capillary 103 (H) 65 - 99 mg/dL   Comment 1 Venous Specimen    Comment 2 Notify RN   Glucose, capillary     Status: None   Collection Time: 08/25/15  3:27 AM  Result Value Ref Range   Glucose-Capillary 98 65 - 99 mg/dL   Comment 1 Venous Specimen    Comment 2 Notify RN   Type and screen     Status: None   Collection Time: 08/25/15  4:10 AM  Result Value Ref Range    ABO/RH(D) A POS    Antibody Screen NEG    Sample Expiration 08/28/2015   Comprehensive metabolic panel     Status: Abnormal   Collection Time: 08/25/15  4:33 AM  Result Value Ref Range   Sodium 130 (L) 135 - 145 mmol/L   Potassium 4.1 3.5 - 5.1 mmol/L    Comment: DELTA CHECK NOTED   Chloride 95 (L) 101 - 111 mmol/L   CO2 25 22 - 32 mmol/L   Glucose, Bld 98 65 - 99 mg/dL   BUN 11 6 - 20 mg/dL   Creatinine, Ser 0.88 0.61 - 1.24 mg/dL   Calcium 8.0 (L) 8.9 - 10.3 mg/dL   Total Protein 5.2 (L) 6.5 - 8.1 g/dL   Albumin 2.6 (L) 3.5 - 5.0 g/dL   AST 27 15 - 41 U/L   ALT 17 17 - 63 U/L   Alkaline Phosphatase 32 (L) 38 - 126 U/L   Total Bilirubin 1.0 0.3 - 1.2 mg/dL   GFR calc non  Af Amer >60 >60 mL/min   GFR calc Af Amer >60 >60 mL/min    Comment: (NOTE) The eGFR has been calculated using the CKD EPI equation. This calculation has not been validated in all clinical situations. eGFR's persistently <60 mL/min signify possible Chronic Kidney Disease.    Anion gap 10 5 - 15  Prealbumin     Status: Abnormal   Collection Time: 08/25/15  4:33 AM  Result Value Ref Range   Prealbumin 7.1 (L) 18 - 38 mg/dL  Magnesium     Status: None   Collection Time: 08/25/15  4:33 AM  Result Value Ref Range   Magnesium 2.2 1.7 - 2.4 mg/dL  Phosphorus     Status: Abnormal   Collection Time: 08/25/15  4:33 AM  Result Value Ref Range   Phosphorus 2.2 (L) 2.5 - 4.6 mg/dL  CBC     Status: Abnormal   Collection Time: 08/25/15  4:33 AM  Result Value Ref Range   WBC 10.6 (H) 4.0 - 10.5 K/uL   RBC 2.41 (L) 4.22 - 5.81 MIL/uL   Hemoglobin 8.2 (L) 13.0 - 17.0 g/dL   HCT 23.7 (L) 39.0 - 52.0 %   MCV 98.3 78.0 - 100.0 fL   MCH 34.0 26.0 - 34.0 pg   MCHC 34.6 30.0 - 36.0 g/dL   RDW 14.5 11.5 - 15.5 %   Platelets 163 150 - 400 K/uL  Differential     Status: Abnormal   Collection Time: 08/25/15  4:33 AM  Result Value Ref Range   Neutrophils Relative % 79 %   Neutro Abs 8.3 (H) 1.7 - 7.7 K/uL   Lymphocytes  Relative 9 %   Lymphs Abs 0.9 0.7 - 4.0 K/uL   Monocytes Relative 11 %   Monocytes Absolute 1.2 (H) 0.1 - 1.0 K/uL   Eosinophils Relative 1 %   Eosinophils Absolute 0.1 0.0 - 0.7 K/uL   Basophils Relative 0 %   Basophils Absolute 0.0 0.0 - 0.1 K/uL  Triglycerides     Status: None   Collection Time: 08/25/15  4:35 AM  Result Value Ref Range   Triglycerides 83 <150 mg/dL    Dg Chest 2 View  08/24/2015  CLINICAL DATA:  Shortness of breath. Status post coronary artery bypass grafting EXAM: CHEST  2 VIEW COMPARISON:  Aug 23, 2015 FINDINGS: Cordis has been removed. A right subclavian catheter is now present with the tip in the superior vena cava. No pneumothorax. There is persistent airspace consolidation in portions of the left upper and left lower lobe regions with small left effusion. There is mild atelectasis in the right base. Lungs elsewhere clear. There is cardiomegaly with pulmonary vascularity within normal limits. A stent is noted in the left subclavian region. There is a left atrial appendage clamp. There is evidence of coronary artery bypass grafting. Temporary pacemaker wires remain attached to the right heart. Loops of bowel appear mildly dilated. IMPRESSION: Areas of patchy infiltrate on the left, stable. Small left effusion. Mild right base atelectasis. No pneumothorax.  Stable cardiomegaly. Suspect a degree of bowel ileus. Electronically Signed   By: Lowella Grip III M.D.   On: 08/24/2015 08:07   Dg Chest Port 1 View  08/25/2015  CLINICAL DATA:  Status post coronary artery bypass graft. EXAM: PORTABLE CHEST 1 VIEW COMPARISON:  Aug 24, 2015. FINDINGS: Stable cardiomegaly. Sternotomy wires are noted. Right-sided PICC line is unchanged in position. No pneumothorax is noted. Old right rib fracture is noted. Right lung  is clear. Stable left basilar opacity is noted concerning for atelectasis with associated effusion. IMPRESSION: Stable left basilar opacity concerning for atelectasis with  associated effusion. Electronically Signed   By: Marijo Conception, M.D.   On: 08/25/2015 07:47   Dg Abd Portable 1v  08/25/2015  CLINICAL DATA:  Ileus EXAM: PORTABLE ABDOMEN - 1 VIEW COMPARISON:  Portable exam 0623 hours compared to 08/24/2015 FINDINGS: Persistent significant gaseous distention of colon up to 15.4 cm diameter, unchanged. No definite colonic wall thickening or gross evidence of free air on supine image No small bowel dilatation. Epicardial pacing wires noted. Bones demineralized with note of a LEFT hip prosthesis. IMPRESSION: Persistent significant gaseous distention of colon up to 15.4 cm diameter, unchanged. Colonic distention to this degree places the patient at increased risk for colonic perforation. Findings called to [place] on [facility] on 08/25/2015 at 0758 hr. Electronically Signed   By: Lavonia Dana M.D.   On: 08/25/2015 08:00   Dg Abd Portable 1v  08/24/2015  CLINICAL DATA:  Nausea for 3 days.  History of ileus. EXAM: PORTABLE ABDOMEN - 1 VIEW COMPARISON:  09/04/2008 FINDINGS: There is colonic distention, most evident involving what is likely the cecum and ascending colon. This measures 15 cm in diameter, which is magnified by the AP technique. There is no small bowel dilation. Soft tissues are unremarkable. There is are well aligned left hip prosthesis. IMPRESSION: 1. Colonic distention suggesting a diffuse colonic adynamic ileus. No small bowel dilation is seen to suggest obstruction. Electronically Signed   By: Lajean Manes M.D.   On: 08/24/2015 11:40    Review of Systems  Constitutional: Negative for fever, chills and diaphoresis.  Respiratory: Positive for cough. Negative for shortness of breath.   Cardiovascular: Positive for palpitations. Negative for chest pain and leg swelling.  Gastrointestinal: Negative for heartburn, nausea, vomiting, abdominal pain, diarrhea, constipation and blood in stool.  Genitourinary: Negative for urgency and frequency.  Musculoskeletal:  Positive for back pain.  Neurological: Positive for weakness.  Psychiatric/Behavioral: Negative for depression.   Blood pressure 124/95, pulse 93, temperature 97.4 F (36.3 C), temperature source Oral, resp. rate 16, height _0  (1.676 m), weight 94.4 kg (208 lb 1.8 oz), SpO2 99 %. Physical Exam  Nursing note and vitals reviewed. Constitutional: He is oriented to person, place, and time. He appears well-developed and well-nourished. He is cooperative.  Non-toxic appearance. He does not have a sickly appearance. He appears ill. No distress.  HENT:  Head: Normocephalic and atraumatic.  Eyes: Right eye exhibits no discharge. Left eye exhibits no discharge. No scleral icterus.  Cardiovascular: An irregularly irregular rhythm present. Tachycardia present.  Exam reveals no friction rub.   No murmur heard. Pulses:      Radial pulses are 1+ on the right side, and 1+ on the left side.    Respiratory: Effort normal. He has decreased breath sounds in the right lower field and the left lower field.  GI: Soft. He exhibits distension. There is no tenderness. There is no rebound and no guarding. No hernia. Hernia confirmed negative in the ventral area, confirmed negative in the right inguinal area and confirmed negative in the left inguinal area.    Tympany noted.  Neurological: He is alert and oriented to person, place, and time.  Skin: Skin is warm. He is not diaphoretic.  Psychiatric: He has a normal mood and affect. His behavior is normal.    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  With his cecum dilated to 15cm, he is at significant risk for pending perforation.  Discussed with Dr. Prescott Gum who feels patient is ok from CT surgery standpoint for surgery.  Consent obtained.  [name] discussed the risks and benefits of the procedure with the patient and son Victor Diaz)  on phone.  Greggory Brandy Alton Tremblay 08/25/2015, 8:20 AM

## 2015-08-25 NOTE — Progress Notes (Signed)
Patient received several runs of potassium and potassium was elevated up to 5 transiently. He had NG suction and suction for rectal tube overnight. Repeat KUB however, showed persistent dilation of the cecum and possible volvulus. Have discussed with Dr. Emogene Morgan patient will be going to surgery today.

## 2015-08-25 NOTE — Anesthesia Procedure Notes (Signed)
Procedure Name: Intubation Date/Time: 08/25/2015 11:44 AM Performed by: Merrilyn Puma B Pre-anesthesia Checklist: Patient identified, Emergency Drugs available, Timeout performed, Patient being monitored and Suction available Patient Re-evaluated:Patient Re-evaluated prior to inductionOxygen Delivery Method: Circle system utilized Preoxygenation: Pre-oxygenation with 100% oxygen Intubation Type: IV induction, Cricoid Pressure applied and Rapid sequence Laryngoscope Size: Mac and 4 Grade View: Grade I Tube type: Subglottic suction tube Tube size: 8.0 mm Number of attempts: 1 Airway Equipment and Method: Stylet Placement Confirmation: CO2 detector,  positive ETCO2,  ETT inserted through vocal cords under direct vision and breath sounds checked- equal and bilateral Secured at: 23 cm Tube secured with: Tape Dental Injury: Teeth and Oropharynx as per pre-operative assessment

## 2015-08-25 NOTE — Progress Notes (Signed)
PARENTERAL NUTRITION CONSULT NOTE - INITIAL  Pharmacy Consult for TPN Indication: ileus  Allergies  Allergen Reactions  . Atorvastatin Anaphylaxis    LIPITOR -  facial swelling    Patient Measurements: Height: 5\' 6"  (167.6 cm) Weight: 208 lb 1.8 oz (94.4 kg) IBW/kg (Calculated) : 63.8 Adjusted Body Weight: 76 kg Usual Weight: 94.4 kg  Vital Signs: Temp: 98 F (36.7 C) (05/10 0840) Temp Source: Oral (05/10 0840) BP: 145/82 mmHg (05/10 0937) Pulse Rate: 100 (05/10 0937) Intake/Output from previous day: 05/09 0701 - 05/10 0700 In: 1875.8 [I.V.:1845.8; NG/GT:30] Out: 2875 [Urine:2875] Intake/Output from this shift: Total I/O In: 177.5 [I.V.:177.5] Out: 1275 [Urine:1275]  Labs:  Recent Labs  08/23/15 0350  08/24/15 0400 08/24/15 1617 08/25/15 0433 08/25/15 0955  WBC 10.4  --  10.0  --  10.6*  --   HGB 8.0*  < > 8.3* 8.8* 8.2*  --   HCT 24.0*  < > 24.3* 26.0* 23.7*  --   PLT 89*  --  121*  --  163  --   INR  --   --   --   --   --  1.29  < > = values in this interval not displayed.   Recent Labs  08/23/15 0350  08/24/15 0400 08/24/15 1617 08/25/15 0433 08/25/15 0435  NA 123*  < > 124* 125* 130*  --   K 3.9  < > 4.4 5.1 4.1  --   CL 91*  < > 90* 89* 95*  --   CO2 24  --  24  --  25  --   GLUCOSE 136*  < > 99 250* 98  --   BUN 8  < > 11 12 11   --   CREATININE 0.97  < > 1.01 0.90 0.88  --   CALCIUM 7.8*  --  7.9*  --  8.0*  --   MG  --   --   --   --  2.2  --   PHOS  --   --   --   --  2.2*  --   PROT 5.3*  --  5.8*  --  5.2*  --   ALBUMIN 2.9*  --  2.8*  --  2.6*  --   AST 35  --  32  --  27  --   ALT 20  --  19  --  17  --   ALKPHOS 28*  --  32*  --  32*  --   BILITOT 0.9  --  0.9  --  1.0  --   PREALBUMIN  --   --   --   --  7.1*  --   TRIG  --   --   --   --   --  83  < > = values in this interval not displayed. Estimated Creatinine Clearance: 82.8 mL/min (by C-G formula based on Cr of 0.88).    Recent Labs  08/24/15 2318 08/25/15 0327  08/25/15 0838  GLUCAP 103* 98 97    Medical History: Past Medical History  Diagnosis Date  . Allergy   . Gout   . Hyperlipidemia   . Alcohol abuse   . Hemothorax on right   . Lisfranc's dislocation 10/20/2011  . Right fibular fracture 10/20/2011  . Essential hypertension   . PSVT (paroxysmal supraventricular tachycardia) ([place])   . Paroxysmal atrial flutter ([facility])     Poor anticoagulation candidate with active alcohol abuse and syncope  .  Subclavian artery stenosis, left     Status post stent placement by Dr. Trula Slade 10/2014    Medications:  Prescriptions prior to admission  Medication Sig Dispense Refill Last Dose  . aspirin 325 MG tablet Take 325 mg by mouth daily.    08/19/2015 at 0530  . cetirizine (ZYRTEC) 10 MG tablet TAKE ONE TABLET BY MOUTH ONCE DAILY AS NEEDED. (Patient taking differently: Take 10 mg by mouth daily as needed for allergies. TAKE ONE TABLET BY MOUTH ONCE DAILY AS NEEDED.) 60 tablet 0 08/18/2015 at Unknown time  . metoprolol succinate (TOPROL-XL) 25 MG 24 hr tablet **MUST SCHEDULE APPT FOR FURTHER REFILLS.** (Patient taking differently: Take 25 mg by mouth daily. **MUST SCHEDULE APPT FOR FURTHER REFILLS.**) 180 tablet 0 08/18/2015 at 1600  . nitroGLYCERIN (NITROSTAT) 0.4 MG SL tablet Place 1 tablet (0.4 mg total) under the tongue every 5 (five) minutes as needed. 25 tablet 3 08/19/2015 at 0400  . omeprazole (PRILOSEC) 20 MG capsule TAKE 1 CAPSULE BY MOUTH DAILY AS NEEDED FOR HEART BURN. 90 capsule 1 08/18/2015 at 2000  . simvastatin (ZOCOR) 40 MG tablet TAKE (1) TABLET BY MOUTH AT BEDTIME. 90 tablet 1 08/18/2015 at 2000  . Multiple Vitamin (MULTIVITAMIN WITH MINERALS) TABS tablet Take 1 tablet by mouth daily. 30 tablet 0 Taking    Insulin Requirements in the past 24 hours:  24 units of Levemir   Current Nutrition:  Has been on CLD > FLD since 5/6  Assessment: Pt is s/p CABG on 5/5, pt begun complaining of abdominal pain and distention; KUB showed ileus with cecum  measuring 15 cm. Surgery was consulted for a possible volvulus. Has a history of ileus that resolved after a couple days s/p prior surgery.   Surgeries/Procedures: Undergoing surgical exploration today for ileus with possible volvulus.  GI: ileus dilated cecum to 15 cm, possible volvulus Endo: cbgs 98-250 on ssi + Levemir 12 bid Lytes: Na 130, K 4.1, CoCa 9.1, Phos 2.2. On kdur 20 daily to keep K > 4 Renal: SCr 0.88, NS @ 75 Pulm: RA Cards: 4 days out from CABG - VSS, intermittent AFib   Asa, Lasix, lopressor, statin Hepatobil: LFTs/Tbili wnl, albumin 2.6, TG 83 Neuro: hx EtOH abuse, taking beer BID while admitted to prevent withdrawal  ID: pre-op abx Best Practices: Lovenox  TPN Access: PICC placed 5/8 TPN start date: 08/25/15   Nutritional Goals:  Per RD  Plan:  -Initiate Clinimic E 5/15 at 40 ml/hr + IVFA 20% at 10 ml/hr -Add multivitamins, trace elements, thiamine and folic acid to TPN -Monitor lytes, cbgs   Victor Diaz, Jake Church 08/25/2015,11:16 AM

## 2015-08-25 NOTE — Progress Notes (Signed)
5 Days Post-Op Procedure(s) (LRB): CORONARY ARTERY BYPASS GRAFTING (CABG) TIMES FOUR  UTILIZING THE LEFT INTERNAL MAMMARY ARTERY AND ENDOSCOPICALLY HARVESTED RIGHT SAPHENEOUS VEINS. CLIPPING OF ATRIAL APPENDAGE (N/A) TRANSESOPHAGEAL ECHOCARDIOGRAM (TEE) (N/A) Subjective: Status post urgent CABG for post MI unstable angina Postop atrial fibrillation Postoperative cecal dilatation, possible volvulus-patient seen by general surgery in preparation for laparotomy History of alcohol abuse, detoxification Intermittent postop atrial fibrillation  Objective: Vital signs in last 24 hours: Temp:  [97.4 F (36.3 C)-98.4 F (36.9 C)] 98 F (36.7 C) (05/10 0840) Pulse Rate:  [93] 93 (05/09 1200) Cardiac Rhythm:  [-] Normal sinus rhythm (05/10 0700) Resp:  [10-21] 16 (05/10 0700) BP: (91-174)/(58-117) 124/95 mmHg (05/10 0700) SpO2:  [86 %-100 %] 99 % (05/10 0725) Weight:  [208 lb 1.8 oz (94.4 kg)] 208 lb 1.8 oz (94.4 kg) (05/10 0600)  Hemodynamic parameters for last 24 hours:  sinus tachycardia afebrile  Intake/Output from previous day: 05/09 0701 - 05/10 0700 In: 1875.8 [I.V.:1845.8; NG/GT:30] Out: 2875 [Urine:2875] Intake/Output this shift: Total I/O In: -  Out: 825 [Urine:825]  Alert and responsive Abdomen distended but tender today in right mid abdomen Mild peripheral edema Lungs with scattered wheezing   Lab Results:  Recent Labs  08/24/15 0400 08/24/15 1617 08/25/15 0433  WBC 10.0  --  10.6*  HGB 8.3* 8.8* 8.2*  HCT 24.3* 26.0* 23.7*  PLT 121*  --  163   BMET:  Recent Labs  08/24/15 0400 08/24/15 1617 08/25/15 0433  NA 124* 125* 130*  K 4.4 5.1 4.1  CL 90* 89* 95*  CO2 24  --  25  GLUCOSE 99 250* 98  BUN 11 12 11   CREATININE 1.01 0.90 0.88  CALCIUM 7.9*  --  8.0*    PT/INR: No results for input(s): LABPROT, INR in the last 72 hours. ABG    Component Value Date/Time   PHART 7.312* 08/21/2015 0109   HCO3 21.2 08/21/2015 0109   TCO2 25 08/24/2015 1617   ACIDBASEDEF 5.0* 08/21/2015 0109   O2SAT 88.0 08/21/2015 0109   CBG (last 3)   Recent Labs  08/24/15 2230 08/24/15 2318 08/25/15 0327  GLUCAP 102* 103* 98    Assessment/Plan: S/P Procedure(s) (LRB): CORONARY ARTERY BYPASS GRAFTING (CABG) TIMES FOUR  UTILIZING THE LEFT INTERNAL MAMMARY ARTERY AND ENDOSCOPICALLY HARVESTED RIGHT SAPHENEOUS VEINS. CLIPPING OF ATRIAL APPENDAGE (N/A) TRANSESOPHAGEAL ECHOCARDIOGRAM (TEE) (N/A) Transfuse one unit of packed cells in preparation for laparotomy-postop expected blood loss anemia TNA to start today   LOS: 6 days    Victor Diaz 08/25/2015

## 2015-08-25 NOTE — Progress Notes (Signed)
Per Dr. Lawson Fiscal, FFP and blood does not need to be given prior to surgery with [name]. Per Dr. Lawson Fiscal, he wanted to have products available if needed during and after surgery.

## 2015-08-25 NOTE — Anesthesia Preprocedure Evaluation (Signed)
Anesthesia Evaluation  Patient identified by MRN, date of birth, ID band Patient awake    Reviewed: Allergy & Precautions, NPO status , Patient's Chart, lab work & pertinent test results, reviewed documented beta blocker date and time   Airway Mallampati: II  TM Distance: >3 FB Neck ROM: Full    Dental  (+) Dental Advisory Given, Poor Dentition, Missing, Chipped   Pulmonary COPD, former smoker (quit 1971),    breath sounds clear to auscultation       Cardiovascular hypertension, Pt. on medications and Pt. on home beta blockers + angina + CAD, + CABG (08/20/15 CABG ), + Peripheral Vascular Disease (subclavian stent) and +CHF  + dysrhythmias Atrial Fibrillation  Rhythm:Irregular Rate:Tachycardia  5/17 ECHO:  EF 40-45%, mild AS, AVA 1.06   Neuro/Psych negative neurological ROS     GI/Hepatic GERD  Controlled and Medicated,(+)     substance abuse  alcohol use, Acute abdomen s/p CABG   Endo/Other  Morbid obesity  Renal/GU negative Renal ROS     Musculoskeletal   Abdominal (+) + obese,   Peds  Hematology  (+) Blood dyscrasia (Hb 8.2), ,   Anesthesia Other Findings   Reproductive/Obstetrics                             Anesthesia Physical Anesthesia Plan  ASA: IV  Anesthesia Plan: General   Post-op Pain Management:    Induction: Intravenous  Airway Management Planned: Oral ETT  Additional Equipment: Arterial line  Intra-op Plan:   Post-operative Plan: Possible Post-op intubation/ventilation  Informed Consent: I have reviewed the patients History and Physical, chart, labs and discussed the procedure including the risks, benefits and alternatives for the proposed anesthesia with the patient or authorized representative who has indicated his/her understanding and acceptance.   Dental advisory given  Plan Discussed with: CRNA and Surgeon  Anesthesia Plan Comments: (Plan routine  monitors, A-line, GETA with transfusion and probable post op ventilation)        Anesthesia Quick Evaluation

## 2015-08-26 ENCOUNTER — Encounter (HOSPITAL_COMMUNITY): Payer: Self-pay | Admitting: General Surgery

## 2015-08-26 ENCOUNTER — Inpatient Hospital Stay (HOSPITAL_COMMUNITY): Payer: Medicare Other

## 2015-08-26 LAB — COMPREHENSIVE METABOLIC PANEL
ALBUMIN: 2.3 g/dL — AB (ref 3.5–5.0)
ALK PHOS: 28 U/L — AB (ref 38–126)
ALT: 15 U/L — ABNORMAL LOW (ref 17–63)
ANION GAP: 7 (ref 5–15)
AST: 22 U/L (ref 15–41)
BUN: 14 mg/dL (ref 6–20)
CALCIUM: 7.9 mg/dL — AB (ref 8.9–10.3)
CO2: 27 mmol/L (ref 22–32)
Chloride: 99 mmol/L — ABNORMAL LOW (ref 101–111)
Creatinine, Ser: 1.02 mg/dL (ref 0.61–1.24)
GFR calc non Af Amer: >60 mL/min (ref >60)
GLUCOSE: 191 mg/dL — AB (ref 65–99)
POTASSIUM: 4 mmol/L (ref 3.5–5.1)
SODIUM: 133 mmol/L — AB (ref 135–145)
TOTAL PROTEIN: 5.1 g/dL — AB (ref 6.5–8.1)
Total Bilirubin: 0.8 mg/dL (ref 0.3–1.2)

## 2015-08-26 LAB — POCT I-STAT, CHEM 8
BUN: 18 mg/dL (ref 6–20)
Calcium, Ion: 1.12 mmol/L — ABNORMAL LOW (ref 1.13–1.30)
Chloride: 96 mmol/L — ABNORMAL LOW (ref 101–111)
Creatinine, Ser: 0.8 mg/dL (ref 0.61–1.24)
Glucose, Bld: 118 mg/dL — ABNORMAL HIGH (ref 65–99)
HEMATOCRIT: 27 % — AB (ref 39.0–52.0)
Hemoglobin: 9.2 g/dL — ABNORMAL LOW (ref 13.0–17.0)
POTASSIUM: 4.2 mmol/L (ref 3.5–5.1)
SODIUM: 134 mmol/L — AB (ref 135–145)
TCO2: 27 mmol/L (ref 0–100)

## 2015-08-26 LAB — POCT I-STAT 3, ART BLOOD GAS (G3+)
ACID-BASE EXCESS: 3 mmol/L — AB (ref 0.0–2.0)
Acid-Base Excess: 2 mmol/L (ref 0.0–2.0)
Acid-Base Excess: 5 mmol/L — ABNORMAL HIGH (ref 0.0–2.0)
Bicarbonate: 27.9 mEq/L — ABNORMAL HIGH (ref 20.0–24.0)
Bicarbonate: 26.9 mEq/L — ABNORMAL HIGH (ref 20.0–24.0)
Bicarbonate: 29.5 mEq/L — ABNORMAL HIGH (ref 20.0–24.0)
O2 SAT: 96 %
O2 SAT: 96 %
O2 SAT: 98 %
PCO2 ART: 42.1 mmHg (ref 35.0–45.0)
PCO2 ART: 40.3 mmHg (ref 35.0–45.0)
PH ART: 7.414 (ref 7.350–7.450)
PH ART: 7.469 — AB (ref 7.350–7.450)
PO2 ART: 81 mmHg (ref 80.0–100.0)
PO2 ART: 80 mmHg (ref 80.0–100.0)
PO2 ART: 91 mmHg (ref 80.0–100.0)
Patient temperature: 98.8
Patient temperature: 98.7
Patient temperature: 97.4
TCO2: 29 mmol/L (ref 0–100)
TCO2: 28 mmol/L (ref 0–100)
TCO2: 31 mmol/L (ref 0–100)
pCO2 arterial: 46 mmHg — ABNORMAL HIGH (ref 35.0–45.0)
pH, Arterial: 7.392 (ref 7.350–7.450)

## 2015-08-26 LAB — GLUCOSE, CAPILLARY
GLUCOSE-CAPILLARY: 161 mg/dL — AB (ref 65–99)
GLUCOSE-CAPILLARY: 131 mg/dL — AB (ref 65–99)
Glucose-Capillary: 131 mg/dL — ABNORMAL HIGH (ref 65–99)
Glucose-Capillary: 155 mg/dL — ABNORMAL HIGH (ref 65–99)
Glucose-Capillary: 189 mg/dL — ABNORMAL HIGH (ref 65–99)
Glucose-Capillary: 225 mg/dL — ABNORMAL HIGH (ref 65–99)

## 2015-08-26 LAB — CBC
HEMATOCRIT: 25.6 % — AB (ref 39.0–52.0)
HEMOGLOBIN: 8.7 g/dL — AB (ref 13.0–17.0)
MCH: 32.7 pg (ref 26.0–34.0)
MCHC: 34 g/dL (ref 30.0–36.0)
MCV: 96.2 fL (ref 78.0–100.0)
Platelets: 153 10*3/uL (ref 150–400)
RBC: 2.66 MIL/uL — AB (ref 4.22–5.81)
RDW: 15.4 % (ref 11.5–15.5)
WBC: 6.6 10*3/uL (ref 4.0–10.5)

## 2015-08-26 LAB — MAGNESIUM: MAGNESIUM: 2.1 mg/dL (ref 1.7–2.4)

## 2015-08-26 LAB — PHOSPHORUS: PHOSPHORUS: 2.2 mg/dL — AB (ref 2.5–4.6)

## 2015-08-26 MED ORDER — LORAZEPAM 1 MG PO TABS: 1.0000 mg | ORAL_TABLET | Freq: Three times a day (TID) | ORAL | Status: DC

## 2015-08-26 MED ORDER — LORAZEPAM 2 MG/ML IJ SOLN: 1.0000 mg | INTRAMUSCULAR | Status: DC | PRN

## 2015-08-26 MED ORDER — LOPERAMIDE HCL 2 MG PO CAPS: 2.0000 mg | ORAL_CAPSULE | ORAL | Status: DC | PRN

## 2015-08-26 MED ORDER — AMIODARONE HCL IN DEXTROSE 360-4.14 MG/200ML-% IV SOLN
INTRAVENOUS | Status: AC
  Filled 2015-08-26: qty 200

## 2015-08-26 MED ORDER — FENTANYL CITRATE (PF) 100 MCG/2ML IJ SOLN
50.0000 ug | INTRAMUSCULAR | Status: DC | PRN
  Administered 2015-08-26 – 2015-08-27 (×2): 50 ug via INTRAVENOUS
  Filled 2015-08-26 (×2): qty 2

## 2015-08-26 MED ORDER — ADULT MULTIVITAMIN W/MINERALS CH: 1.0000 | ORAL_TABLET | Freq: Every day | ORAL | Status: DC

## 2015-08-26 MED ORDER — LORAZEPAM 1 MG PO TABS: 1.0000 mg | ORAL_TABLET | Freq: Four times a day (QID) | ORAL | Status: DC | PRN

## 2015-08-26 MED ORDER — AMIODARONE LOAD VIA INFUSION
150.0000 mg | Freq: Once | INTRAVENOUS | Status: AC
  Administered 2015-08-26: 150 mg via INTRAVENOUS

## 2015-08-26 MED ORDER — TRACE MINERALS CR-CU-MN-SE-ZN 10-1000-500-60 MCG/ML IV SOLN
INTRAVENOUS | Status: AC
  Administered 2015-08-26: 17:00:00 via INTRAVENOUS
  Filled 2015-08-26: qty 1680

## 2015-08-26 MED ORDER — LORAZEPAM 2 MG/ML IJ SOLN
1.0000 mg | INTRAMUSCULAR | Status: DC | PRN
  Administered 2015-09-01: 2 mg via INTRAVENOUS
  Administered 2015-09-04 (×2): 1 mg via INTRAVENOUS
  Administered 2015-09-05: 2 mg via INTRAVENOUS
  Filled 2015-08-26 (×4): qty 1

## 2015-08-26 MED ORDER — VITAMIN B-1 100 MG PO TABS: 100.0000 mg | ORAL_TABLET | Freq: Every day | ORAL | Status: DC

## 2015-08-26 MED ORDER — THIAMINE HCL 100 MG/ML IJ SOLN: 100.0000 mg | Freq: Once | INTRAMUSCULAR | Status: DC

## 2015-08-26 MED ORDER — LORAZEPAM 1 MG PO TABS: 1.0000 mg | ORAL_TABLET | Freq: Four times a day (QID) | ORAL | Status: DC

## 2015-08-26 MED ORDER — HYDROXYZINE HCL 25 MG PO TABS: 25.0000 mg | ORAL_TABLET | Freq: Four times a day (QID) | ORAL | Status: DC | PRN

## 2015-08-26 MED ORDER — LORAZEPAM 1 MG PO TABS: 1.0000 mg | ORAL_TABLET | Freq: Two times a day (BID) | ORAL | Status: DC

## 2015-08-26 MED ORDER — FAT EMULSION 20 % IV EMUL
240.0000 mL | INTRAVENOUS | Status: AC
  Administered 2015-08-26: 240 mL via INTRAVENOUS
  Filled 2015-08-26: qty 250

## 2015-08-26 MED ORDER — AMIODARONE HCL IN DEXTROSE 360-4.14 MG/200ML-% IV SOLN
30.0000 mg/h | INTRAVENOUS | Status: DC
  Administered 2015-08-27 – 2015-09-06 (×20): 30 mg/h via INTRAVENOUS
  Filled 2015-08-26 (×21): qty 200

## 2015-08-26 MED ORDER — AMIODARONE HCL IN DEXTROSE 360-4.14 MG/200ML-% IV SOLN
60.0000 mg/h | INTRAVENOUS | Status: AC
  Administered 2015-08-26 (×2): 60 mg/h via INTRAVENOUS
  Filled 2015-08-26 (×2): qty 200

## 2015-08-26 MED ORDER — DEXTROSE 5 % IV SOLN
1.0000 g | Freq: Once | INTRAVENOUS | Status: AC
  Administered 2015-08-26: 1 g via INTRAVENOUS
  Filled 2015-08-26: qty 1

## 2015-08-26 MED ORDER — LORAZEPAM 1 MG PO TABS: 1.0000 mg | ORAL_TABLET | Freq: Every day | ORAL | Status: DC

## 2015-08-26 MED ORDER — INSULIN DETEMIR 100 UNIT/ML ~~LOC~~ SOLN
20.0000 [IU] | Freq: Two times a day (BID) | SUBCUTANEOUS | Status: DC
  Administered 2015-08-26 – 2015-09-07 (×22): 20 [IU] via SUBCUTANEOUS
  Filled 2015-08-26 (×29): qty 0.2

## 2015-08-26 MED ORDER — FENTANYL 75 MCG/HR TD PT72
75.0000 ug | MEDICATED_PATCH | TRANSDERMAL | Status: DC
  Administered 2015-08-26 – 2015-09-04 (×4): 75 ug via TRANSDERMAL
  Filled 2015-08-26 (×4): qty 1

## 2015-08-26 MED ORDER — ONDANSETRON 4 MG PO TBDP
4.0000 mg | ORAL_TABLET | Freq: Four times a day (QID) | ORAL | Status: DC | PRN
  Filled 2015-08-26: qty 1

## 2015-08-26 MED ORDER — POTASSIUM CHLORIDE 10 MEQ/50ML IV SOLN
10.0000 meq | INTRAVENOUS | Status: AC
  Administered 2015-08-26 (×2): 10 meq via INTRAVENOUS
  Filled 2015-08-26 (×2): qty 50

## 2015-08-26 NOTE — Clinical Social Work Note (Signed)
CSW left bed offers in room for patient. CSW called patient son to inform him of bed offers. CSW left message for a return call. CSW continuing to follow for support and discharge needs.  [employer], LCSW [phone]

## 2015-08-26 NOTE — Care Management Important Message (Signed)
Important Message  Patient Details  Name: Victor Diaz MRN: [id number] Date of Birth: 10-Sep-1945   Medicare Important Message Given:  Yes    Nathen May 08/26/2015, 1:11 PM

## 2015-08-26 NOTE — Progress Notes (Signed)
Patient ID: Victor Diaz, male   DOB: February 10, 1946, 70 y.o.   MRN: [id number]      [address]       [place] [address]             [phone]      BP 105/66 mmHg  Pulse 91  Temp(Src) 97.4 F (36.3 C) (Oral)  Resp 10  Ht 5\' 6"  (1.676 m)  Wt 212 lb 15.4 oz (96.6 kg)  BMI 34.39 kg/m2  SpO2 100%   Intake/Output Summary (Last 24 hours) at 08/26/15 1727 Last data filed at 08/26/15 1400  Gross per 24 hour  Intake 1963.11 ml  Output   3725 ml  Net -1761.89 ml    Hx heavy EtOH consumption- will start ativan detox protocol  Labs OK  Nhia Heaphy C. Roxan Hockey, MD [facility] [phone]

## 2015-08-26 NOTE — Progress Notes (Signed)
PARENTERAL NUTRITION CONSULT NOTE - INITIAL  Pharmacy Consult for TPN Indication: ileus  Allergies  Allergen Reactions  . Atorvastatin Anaphylaxis    LIPITOR -  facial swelling    Patient Measurements: Height: 5\' 6"  (167.6 cm) Weight: 212 lb 15.4 oz (96.6 kg) IBW/kg (Calculated) : 63.8 Adjusted Body Weight: 76 kg Usual Weight: 94.4 kg  Vital Signs: Temp: 98.4 F (36.9 C) (05/11 0757) Temp Source: Oral (05/11 0757) BP: 135/62 mmHg (05/11 0739) Pulse Rate: 90 (05/11 0739) Intake/Output from previous day: 05/10 0701 - 05/11 0700 In: 3378.9 [I.V.:2361.6; Blood:335; NG/GT:30; IV Piggyback:50; TPN:602.3] Out: 4750 [Urine:4425; Emesis/NG output:225; Blood:100] Intake/Output from this shift:    Labs:  Recent Labs  08/25/15 0433 08/25/15 0955 08/25/15 1521 08/26/15 0436  WBC 10.6*  --  7.8 6.6  HGB 8.2*  --  9.8* 8.7*  HCT 23.7*  --  29.9* 25.6*  PLT 163  --  198 153  INR  --  1.29  --   --      Recent Labs  08/24/15 0400  08/25/15 0433 08/25/15 0435 08/25/15 1521 08/26/15 0436  NA 124*  < > 130*  --  131* 133*  K 4.4  < > 4.1  --  4.5 4.0  CL 90*  < > 95*  --  100* 99*  CO2 24  --  25  --  24 27  GLUCOSE 99  < > 98  --  138* 191*  BUN 11  < > 11  --  12 14  CREATININE 1.01  < > 0.88  --  0.96 1.02  CALCIUM 7.9*  --  8.0*  --  7.6* 7.9*  MG  --   --  2.2  --   --  2.1  PHOS  --   --  2.2*  --   --  2.2*  PROT 5.8*  --  5.2*  --   --  5.1*  ALBUMIN 2.8*  --  2.6*  --   --  2.3*  AST 32  --  27  --   --  22  ALT 19  --  17  --   --  15*  ALKPHOS 32*  --  32*  --   --  28*  BILITOT 0.9  --  1.0  --   --  0.8  PREALBUMIN  --   --  7.1*  --   --   --   TRIG  --   --   --  83  --   --   < > = values in this interval not displayed. Estimated Creatinine Clearance: 72.3 mL/min (by C-G formula based on Cr of 1.02).    Recent Labs  08/25/15 2147 08/26/15 0009 08/26/15 0350  GLUCAP 219* 225* 189*    Medical History: Past Medical History  Diagnosis Date   . Allergy   . Gout   . Hyperlipidemia   . Alcohol abuse   . Hemothorax on right   . Lisfranc's dislocation 10/20/2011  . Right fibular fracture 10/20/2011  . Essential hypertension   . PSVT (paroxysmal supraventricular tachycardia) ([place])   . Paroxysmal atrial flutter ([facility])     Poor anticoagulation candidate with active alcohol abuse and syncope  . Subclavian artery stenosis, left     Status post stent placement by Dr. Trula Slade 10/2014    Medications:  Prescriptions prior to admission  Medication Sig Dispense Refill Last Dose  . aspirin 325 MG tablet Take 325 mg by  mouth daily.    08/19/2015 at 0530  . cetirizine (ZYRTEC) 10 MG tablet TAKE ONE TABLET BY MOUTH ONCE DAILY AS NEEDED. (Patient taking differently: Take 10 mg by mouth daily as needed for allergies. TAKE ONE TABLET BY MOUTH ONCE DAILY AS NEEDED.) 60 tablet 0 08/18/2015 at Unknown time  . metoprolol succinate (TOPROL-XL) 25 MG 24 hr tablet **MUST SCHEDULE APPT FOR FURTHER REFILLS.** (Patient taking differently: Take 25 mg by mouth daily. **MUST SCHEDULE APPT FOR FURTHER REFILLS.**) 180 tablet 0 08/18/2015 at 1600  . nitroGLYCERIN (NITROSTAT) 0.4 MG SL tablet Place 1 tablet (0.4 mg total) under the tongue every 5 (five) minutes as needed. 25 tablet 3 08/19/2015 at 0400  . omeprazole (PRILOSEC) 20 MG capsule TAKE 1 CAPSULE BY MOUTH DAILY AS NEEDED FOR HEART BURN. 90 capsule 1 08/18/2015 at 2000  . simvastatin (ZOCOR) 40 MG tablet TAKE (1) TABLET BY MOUTH AT BEDTIME. 90 tablet 1 08/18/2015 at 2000  . Multiple Vitamin (MULTIVITAMIN WITH MINERALS) TABS tablet Take 1 tablet by mouth daily. 30 tablet 0 Taking    Insulin Requirements in the past 24 hours:  24 units of Levemir  21 units of SSI  Assessment: Pt is s/p CABG on 5/5, pt begun complaining of abdominal pain and distention; KUB showed ileus with cecum measuring 15 cm. Surgery was consulted for a possible volvulus. Has a history of ileus that resolved after a couple days s/p prior  surgery.  GI: ileus dilated cecum to 15 cm. 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 Had R hemicolectomy. Albumin low at 2.3. Plan to await return of bowel function. Endo: CBGs jumped up yesterday (90-220s) on ssi + Levemir increased this am to 20 units BID. Lytes: wnl exc Na 133, CoCa 9.1, Phos borderline low at 2.2, Mg ok at 2.1 (goal > 2). K ok at 4.0 (goal > 4) Renal: SCr stable, CrCl ~41ml/min. UOP good at 1.46ml/kg/hr yesterday. D5NS @ 10 Pulm: Remains intubated Cards: 4 days out from CABG - VSS, intermittent AFib. Asa, Lasix, lopressor, statin Hepatobil: LFTs/Tbili wnl, TG 83 Neuro: hx EtOH abuse, taking beer BID while admitted to prevent withdrawal  ID: pre-op abx Best Practices: Lovenox  TPN Access: PICC placed 5/8 TPN start date: 08/25/15  Nutritional Goals:  Per RD  Current Nutrition:  NPO Clinimix E 5/15 at 49ml/hr IV lipid emulsions at 16ml/hr  Plan:  Increase Clinimix E 5/15 to 9ml/hr Continue 20% lipid emulsion at 48ml/hr D5NS at 10 ml/hr This provides 84 g of protein and 1672 kCals per day Add MVI and TE in TPN Add thiamine and folic acid to TPN Continue resistant SSI and adjust as needed Levemir increased to 20 units BID per MD Monitor TPN labs, daily Cmet F/U return of bowel function  Elenor Quinones, PharmD, [facility] Clinical Pharmacist Pager [phone] 08/26/2015 7:58 AM

## 2015-08-26 NOTE — Progress Notes (Signed)
PT Cancellation Note  Patient Details Name: STEADMAN MANGLICMOT MRN: [id number] DOB: 1945-10-16   Cancelled Treatment:    Reason Eval/Treat Not Completed: Patient not medically ready.  Noted pt currently on continuous Fentanyl and Precedex post-op.  Will hold PT and mobility at this time.     Catarina Hartshorn, [place] 08/26/2015, 8:14 AM

## 2015-08-26 NOTE — Progress Notes (Signed)
1 Day Post-Op Procedure(s) (LRB): EXPLORATORY LAPAROTOMY (N/A) ASCENDING AND TRANSVERSE COLECTOMY (N/A) Subjective: CABG for unstable angina Hx Etoh abuse, unsuccessful detox Postop a-fib   now nsr Postop cecal dilatation to 15 cm >>R colectomy 5-10 with primary anastomosis On TNA for ileus, surgery Intubated overnight- will wean vent tioday No prob with DTs  Objective: Vital signs in last 24 hours: Temp:  [97.6 F (36.4 C)-98.8 F (37.1 C)] 98.4 F (36.9 C) (05/11 0757) Pulse Rate:  [89-90] 90 (05/11 1030) Cardiac Rhythm:  [-] Atrial paced (05/11 0800) Resp:  [11-16] 14 (05/11 1030) BP: (80-195)/(51-78) 108/51 mmHg (05/11 1030) SpO2:  [94 %-100 %] 97 % (05/11 1030) Arterial Line BP: (79-187)/(50-98) 102/52 mmHg (05/11 1000) FiO2 (%):  [40 %-50 %] 40 % (05/11 0940) Weight:  [212 lb 15.4 oz (96.6 kg)] 212 lb 15.4 oz (96.6 kg) (05/11 0245)  Hemodynamic parameters for last 24 hours:  stable BP , nsr  Intake/Output from previous day: 05/10 0701 - 05/11 0700 In: 3501.1 [I.V.:2433.8; Blood:335; NG/GT:30; IV Piggyback:50; TPN:652.3] Out: [id number] [Urine:4425; Emesis/NG output:225; Blood:100] Intake/Output this shift: Total I/O In: 246.6 [I.V.:86.6; NG/GT:60; TPN:100] Out: 475 [Urine:475]       Exam    General- alert and comfortable   Lungs- clear without rales, wheezes   Cor- regular rate and rhythm, no murmur , gallop   Abdomen- soft, non-tender   Extremities - warm, non-tender, minimal edema   Neuro- oriented, appropriate, no focal weakness   Lab Results:  Recent Labs  08/25/15 1521 08/26/15 0436  WBC 7.8 6.6  HGB 9.8* 8.7*  HCT 29.9* 25.6*  PLT 198 153   BMET:  Recent Labs  08/25/15 1521 08/26/15 0436  NA 131* 133*  K 4.5 4.0  CL 100* 99*  CO2 24 27  GLUCOSE 138* 191*  BUN 12 14  CREATININE 0.96 1.02  CALCIUM 7.6* 7.9*    PT/INR:  Recent Labs  08/25/15 0955  LABPROT 16.3*  INR 1.29   ABG    Component Value Date/Time   PHART 7.392 08/26/2015  0417   HCO3 27.9* 08/26/2015 0417   TCO2 29 08/26/2015 0417   ACIDBASEDEF 1.0 08/25/2015 1503   O2SAT 96.0 08/26/2015 0417   CBG (last 3)   Recent Labs  08/26/15 0009 08/26/15 0350 08/26/15 0754  GLUCAP 225* 189* 161*    Assessment/Plan: S/P Procedure(s) (LRB): EXPLORATORY LAPAROTOMY (N/A) ASCENDING AND TRANSVERSE COLECTOMY (N/A) Extubate Cont TNA, Cefotetan   LOS: 7 days    Victor Diaz 08/26/2015

## 2015-08-26 NOTE — Progress Notes (Signed)
Initial Nutrition Assessment  DOCUMENTATION CODES:   Obesity unspecified  INTERVENTION:    TPN per pharmacy  NUTRITION DIAGNOSIS:   Inadequate oral intake related to altered GI function as evidenced by NPO status  GOAL:   Patient will meet greater than or equal to 90% of their needs  MONITOR:   Diet advancement, Labs, Weight trends, Skin, I & O's  REASON FOR ASSESSMENT:   Consult New TPN/TNA  ASSESSMENT:   70 yo M POD 5 s/p 3 vessel CABG for CAD who over the past two days has noticed increasing abdominal distention. He states that he has not had passage of flatus in two days. Yesterday, imaging of the abdomen demonstrated an enlarged colon with distention in the cecum to 15cm. GI was consulted and attempt at placement of a rectal tube was done. Minimal stool was evacuated. Today on imaging, persistent dilation was noted to 15cm and Surgery was consulted over concern for possible volvulus.  Patient s/p procedures 5/10: EXPLORATORY LAPAROTOMY  RIGHT HEMI-COLECTOMY WITH ANASTAMOSIS  RD unable to obtain hx from pt; he's mumbling. S/p extubation this AM. Noted hx of ETOH abuse. TPN started for post-op ileus.  Patient is receiving TPN with Clinimix E 5/15 @ 70 ml/hr and lipids @ 10 ml/hr. Provides 1672 kcal and 84 grams protein per day. Meets 80% minimum estimated energy needs and 73% minimum estimated protein needs.  Nutrition focused physical exam completed.  No muscle or subcutaneous fat depletion noticed.  Diet Order:  TPN (CLINIMIX-E) Adult TPN (CLINIMIX-E) Adult  Skin:  Reviewed, no issues  Last BM:  5/9  Height:   Ht Readings from Last 1 Encounters:  08/19/15 5\' 6"  (1.676 m)    Weight:   Wt Readings from Last 1 Encounters:  08/26/15 212 lb 15.4 oz (96.6 kg)    Ideal Body Weight:  64.5 kg  BMI:  Body mass index is 34.39 kg/(m^2).  Estimated Nutritional Needs:   Kcal:  2100-2300  Protein:  115-125 gm  Fluid:  per MD  EDUCATION NEEDS:    No education needs identified at this time  Arthur Holms, RD, LDN Pager #: [phone] After-Hours Pager #: [phone]

## 2015-08-26 NOTE — Progress Notes (Signed)
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  Post op had some soft pressures and started on AAI pacing.  Remains intubated.  NAE overnight.  Objective: Vital signs in last 24 hours: Temp:  [97.6 F (36.4 C)-98.8 F (37.1 C)] 98.8 F (37.1 C) (05/11 0400) Pulse Rate:  [89-100] 90 (05/11 0739) Resp:  [11-21] 13 (05/11 0739) BP: (80-195)/(55-85) 135/62 mmHg (05/11 0739) SpO2:  [92 %-100 %] 100 % (05/11 0739) Arterial Line BP: (79-187)/(50-98) 127/58 mmHg (05/11 0600) FiO2 (%):  [50 %] 50 % (05/11 0739) Weight:  [96.6 kg (212 lb 15.4 oz)] 96.6 kg (212 lb 15.4 oz) (05/11 0245) Last BM Date: 08/24/15  Intake/Output from previous day: 05/10 0701 - 05/11 0700 In: 3378.9 [I.V.:2361.6; Blood:335; NG/GT:30; IV Piggyback:50; TPN:602.3] Out: 4750 [Urine:4425; Emesis/NG output:225; Blood:100] Intake/Output this shift:    General appearance: Intubated and sedated.  No apparent distress GI: Soft.  Distended.  + Tympany.  No rebound.  Dressing in place over midline wound. Incision/Wound:  Dressing in place over wound.  Some shadowing on dressing.  Lab Results:   Recent Labs  08/25/15 1521 08/26/15 0436  WBC 7.8 6.6  HGB 9.8* 8.7*  HCT 29.9* 25.6*  PLT 198 153   BMET  Recent Labs  08/25/15 1521 08/26/15 0436  NA 131* 133*  K 4.5 4.0  CL 100* 99*  CO2 24 27  GLUCOSE 138* 191*  BUN 12 14  CREATININE 0.96 1.02  CALCIUM 7.6* 7.9*   PT/INR  Recent Labs  08/25/15 0955  LABPROT 16.3*  INR 1.29   ABG  Recent Labs  08/25/15 1514 08/26/15 0417  PHART 7.387 7.392  HCO3 26.5* 27.9*    Studies/Results: Dg Chest Port 1 View  08/26/2015  CLINICAL DATA:  Postop partial colectomy for cecal volvulus. EXAM: PORTABLE CHEST 1 VIEW COMPARISON:  08/25/2015 radiographs. FINDINGS: 0614 hours. The endotracheal tube, right arm PICC and nasogastric tube appear unchanged. There is stable cardiomegaly status post median  sternotomy, CABG and left atrial appendage clipping. No significant change in left-greater-than-right basilar airspace opacities. No pneumothorax or significant pleural effusion. IMPRESSION: Stable postoperative chest with probable mild bibasilar atelectasis. No pneumothorax. Electronically Signed   By: Richardean Sale M.D.   On: 08/26/2015 07:39   Dg Chest Port 1 View  08/25/2015  CLINICAL DATA:  Intubation EXAM: PORTABLE CHEST 1 VIEW COMPARISON:  Portable exam 1508 hours compared to 08/25/2015 FINDINGS: Tip of endotracheal tube projects 2.7 cm above carina. Nasogastric tube extends into stomach. RIGHT arm PICC line tip projects over SVC the level of the carina. Enlargement of cardiac silhouette post CABG and clipping of LEFT atrial appendage. Stent identified at expected position of the origin of the LEFT subclavian artery. Atelectasis versus consolidation LEFT lower lobe. Minimal RIGHT basilar atelectasis. Lungs otherwise clear. No gross pleural effusion or pneumothorax. IMPRESSION: Enlargement of cardiac silhouette with pulmonary vascular congestion post CABG and LEFT atrial appendage clipping. Atelectasis versus consolidation LEFT lower lobe with minimal RIGHT basilar atelectasis. Line and tube positions as above. Electronically Signed   By: Lavonia Dana M.D.   On: 08/25/2015 15:21   Dg Chest Port 1 View  08/25/2015  CLINICAL DATA:  Status post coronary artery bypass graft. EXAM: PORTABLE CHEST 1 VIEW COMPARISON:  Aug 24, 2015. FINDINGS: Stable cardiomegaly. Sternotomy wires are noted. Right-sided PICC line is unchanged in position. No pneumothorax is noted. Old right rib fracture is noted. Right lung is clear. Stable left basilar opacity is  noted concerning for atelectasis with associated effusion. IMPRESSION: Stable left basilar opacity concerning for atelectasis with associated effusion. Electronically Signed   By: Marijo Conception, M.D.   On: 08/25/2015 07:47   Dg Abd Portable 1v  08/26/2015  CLINICAL  DATA:  Postop partial colectomy for cecal volvulus. EXAM: PORTABLE ABDOMEN - 1 VIEW COMPARISON:  08/25/2015 and 08/24/2015 radiographs. FINDINGS: 0617 hours. Interval surgery manifesting as midline skin staples. No significant residual bowel distention identified. No supine evidence of free intraperitoneal air. Nasogastric tube tip terminates over the left upper quadrant of the abdomen. Lumbar spondylosis and left hip arthroplasty noted. IMPRESSION: Interval abdominal surgery without demonstrated complication. No significant residual bowel distention identified. Electronically Signed   By: Richardean Sale M.D.   On: 08/26/2015 07:41   Dg Abd Portable 1v  08/25/2015  CLINICAL DATA:  Ileus EXAM: PORTABLE ABDOMEN - 1 VIEW COMPARISON:  Portable exam 0623 hours compared to 08/24/2015 FINDINGS: Persistent significant gaseous distention of colon up to 15.4 cm diameter, unchanged. No definite colonic wall thickening or gross evidence of free air on supine image No small bowel dilatation. Epicardial pacing wires noted. Bones demineralized with note of a LEFT hip prosthesis. IMPRESSION: Persistent significant gaseous distention of colon up to 15.4 cm diameter, unchanged. Colonic distention to this degree places the patient at increased risk for colonic perforation. Findings called to [place] [facility] on 08/25/2015 at 0758 hr. Electronically Signed   By: Lavonia Dana M.D.   On: 08/25/2015 08:00   Dg Abd Portable 1v  08/24/2015  CLINICAL DATA:  Nausea for 3 days.  History of ileus. EXAM: PORTABLE ABDOMEN - 1 VIEW COMPARISON:  09/04/2008 FINDINGS: There is colonic distention, most evident involving what is likely the cecum and ascending colon. This measures 15 cm in diameter, which is magnified by the AP technique. There is no small bowel dilation. Soft tissues are unremarkable. There is are well aligned left hip prosthesis. IMPRESSION: 1. Colonic distention suggesting a diffuse colonic adynamic ileus. No small bowel  dilation is seen to suggest obstruction. Electronically Signed   By: Lajean Manes M.D.   On: 08/24/2015 11:40    Anti-infectives: Anti-infectives    Start     Dose/Rate Route Frequency Ordered Stop   08/25/15 1115  cefoTEtan in Dextrose 5% (CEFOTAN) IVPB 2 g     2 g Intravenous  Once 08/25/15 1111 08/25/15 1150   08/25/15 1113  cefoTEtan in Dextrose 5% (CEFOTAN) 2-2.08 GM-% IVPB    Comments:  [name]   : cabinet override      08/25/15 1113 08/25/15 2329   08/21/15 0400  cefUROXime (ZINACEF) 1.5 g in dextrose 5 % 50 mL IVPB     1.5 g 100 mL/hr over 30 Minutes Intravenous Every 12 hours 08/20/15 1742 08/22/15 1630   08/20/15 2330  vancomycin (VANCOCIN) IVPB 1000 mg/200 mL premix     1,000 mg 200 mL/hr over 60 Minutes Intravenous Every 12 hours 08/20/15 1827 08/21/15 2330   08/20/15 2315  vancomycin (VANCOCIN) IVPB 1000 mg/200 mL premix  Status:  Discontinued     1,000 mg 200 mL/hr over 60 Minutes Intravenous  Once 08/20/15 1742 08/20/15 1827   08/20/15 0400  vancomycin (VANCOCIN) 1,500 mg in sodium chloride 0.9 % 250 mL IVPB  Status:  Discontinued     1,500 mg 125 mL/hr over 120 Minutes Intravenous To Surgery 08/19/15 1605 08/20/15 1523   08/20/15 0400  cefUROXime (ZINACEF) 1.5 g in dextrose 5 % 50 mL  IVPB  Status:  Discontinued     1.5 g 100 mL/hr over 30 Minutes Intravenous To Surgery 08/19/15 1605 08/20/15 1523   08/20/15 0400  cefUROXime (ZINACEF) 750 mg in dextrose 5 % 50 mL IVPB  Status:  Discontinued     750 mg 100 mL/hr over 30 Minutes Intravenous To Surgery 08/19/15 1605 08/20/15 1523      Assessment/Plan: s/p Procedure(s): EXPLORATORY LAPAROTOMY (N/A) ASCENDING AND TRANSVERSE COLECTOMY (N/A) Stable post op s/p hemicolectomy.  OG to suction.  Will continue with NPO and TPN until demonstrates return of bowel function.  Defer management of ventilator and sedation to critical care.   LOS: 7 days    Reino Kent 08/26/2015

## 2015-08-26 NOTE — Procedures (Signed)
Extubation Procedure Note  Patient Details:   Name: Victor Diaz DOB: 11-14-1945 MRN: [id number]   Airway Documentation:     Evaluation  O2 sats: stable throughout Complications: No apparent complications Patient did tolerate procedure well. Bilateral Breath Sounds: Clear   Yes   Pt. Was extubated to a 4L [place] without any complications, dyspnea or stridor noted. Pt. Achieved a goal of .8L on VC & -30 on NIF. Pt. Was instructed on IS x 5, highest goal reached was 1476mL.  Noell Lorensen L 08/26/2015, 10:30 AM

## 2015-08-26 NOTE — Significant Event (Addendum)
Fentanyl drip discontinued, patient extubated.  50cc wasted in sink witnessed by [facility] RN   Cornelia Copa, [facility] Mater

## 2015-08-27 ENCOUNTER — Inpatient Hospital Stay (HOSPITAL_COMMUNITY): Payer: Medicare Other

## 2015-08-27 LAB — PREPARE FRESH FROZEN PLASMA: Unit division: 0

## 2015-08-27 LAB — GLUCOSE, CAPILLARY
GLUCOSE-CAPILLARY: 108 mg/dL — AB (ref 65–99)
GLUCOSE-CAPILLARY: 132 mg/dL — AB (ref 65–99)
GLUCOSE-CAPILLARY: 134 mg/dL — AB (ref 65–99)
GLUCOSE-CAPILLARY: 151 mg/dL — AB (ref 65–99)
Glucose-Capillary: 115 mg/dL — ABNORMAL HIGH (ref 65–99)
Glucose-Capillary: 127 mg/dL — ABNORMAL HIGH (ref 65–99)

## 2015-08-27 LAB — COMPREHENSIVE METABOLIC PANEL
ALK PHOS: 32 U/L — AB (ref 38–126)
ALT: 15 U/L — AB (ref 17–63)
AST: 22 U/L (ref 15–41)
Albumin: 2.3 g/dL — ABNORMAL LOW (ref 3.5–5.0)
Anion gap: 11 (ref 5–15)
BUN: 18 mg/dL (ref 6–20)
CALCIUM: 8.4 mg/dL — AB (ref 8.9–10.3)
CHLORIDE: 98 mmol/L — AB (ref 101–111)
CO2: 27 mmol/L (ref 22–32)
CREATININE: 0.95 mg/dL (ref 0.61–1.24)
GFR calc non Af Amer: >60 mL/min (ref >60)
GLUCOSE: 142 mg/dL — AB (ref 65–99)
Potassium: 3.8 mmol/L (ref 3.5–5.1)
SODIUM: 136 mmol/L (ref 135–145)
Total Bilirubin: 0.8 mg/dL (ref 0.3–1.2)
Total Protein: 5.5 g/dL — ABNORMAL LOW (ref 6.5–8.1)

## 2015-08-27 LAB — CBC
HCT: 26.1 % — ABNORMAL LOW (ref 39.0–52.0)
Hemoglobin: 8.6 g/dL — ABNORMAL LOW (ref 13.0–17.0)
MCH: 31.7 pg (ref 26.0–34.0)
MCHC: 33 g/dL (ref 30.0–36.0)
MCV: 96.3 fL (ref 78.0–100.0)
PLATELETS: 228 10*3/uL (ref 150–400)
RBC: 2.71 MIL/uL — ABNORMAL LOW (ref 4.22–5.81)
RDW: 15.7 % — AB (ref 11.5–15.5)
WBC: 12.4 10*3/uL — ABNORMAL HIGH (ref 4.0–10.5)

## 2015-08-27 LAB — BLOOD GAS, ARTERIAL
Acid-Base Excess: 3.3 mmol/L — ABNORMAL HIGH (ref 0.0–2.0)
Bicarbonate: 27 mEq/L — ABNORMAL HIGH (ref 20.0–24.0)
O2 CONTENT: 4 L/min
O2 Saturation: 98.8 %
PCO2 ART: 38.3 mmHg (ref 35.0–45.0)
PH ART: 7.461 — AB (ref 7.350–7.450)
PO2 ART: 123 mmHg — AB (ref 80.0–100.0)
Patient temperature: 98.6
TCO2: 28.1 mmol/L (ref 0–100)

## 2015-08-27 MED ORDER — POTASSIUM CHLORIDE 10 MEQ/50ML IV SOLN
10.0000 meq | INTRAVENOUS | Status: AC
  Administered 2015-08-27 (×3): 10 meq via INTRAVENOUS
  Filled 2015-08-27: qty 50

## 2015-08-27 MED ORDER — DEXTROSE-NACL 5-0.9 % IV SOLN
INTRAVENOUS | Status: DC
  Administered 2015-08-31: 20:00:00 via INTRAVENOUS

## 2015-08-27 MED ORDER — AMIODARONE IV BOLUS ONLY 150 MG/100ML
150.0000 mg | Freq: Once | INTRAVENOUS | Status: AC
Start: 2015-08-27 — End: 2015-08-27
  Administered 2015-08-27: 150 mg via INTRAVENOUS

## 2015-08-27 MED ORDER — POTASSIUM CHLORIDE 10 MEQ/50ML IV SOLN
10.0000 meq | INTRAVENOUS | Status: AC
  Administered 2015-08-27 (×2): 10 meq via INTRAVENOUS
  Filled 2015-08-27: qty 50

## 2015-08-27 MED ORDER — TRACE MINERALS CR-CU-MN-SE-ZN 10-1000-500-60 MCG/ML IV SOLN
INTRAVENOUS | Status: AC
  Administered 2015-08-27: 18:00:00 via INTRAVENOUS
  Filled 2015-08-27: qty 1992

## 2015-08-27 MED ORDER — FENTANYL CITRATE (PF) 100 MCG/2ML IJ SOLN
25.0000 ug | INTRAMUSCULAR | Status: DC | PRN
  Administered 2015-08-27 – 2015-08-31 (×10): 25 ug via INTRAVENOUS
  Filled 2015-08-27 (×10): qty 2

## 2015-08-27 MED ORDER — FAT EMULSION 20 % IV EMUL
240.0000 mL | INTRAVENOUS | Status: AC
  Administered 2015-08-27: 240 mL via INTRAVENOUS
  Filled 2015-08-27: qty 250

## 2015-08-27 NOTE — Progress Notes (Addendum)
2 Days Post-Op  Subjective: NAe overnight.  Extubated yesterday.  No return of bowel function.  Objective: Vital signs in last 24 hours: Temp:  [97 F (36.1 C)-98.5 F (36.9 C)] 98.5 F (36.9 C) (05/12 0411) Pulse Rate:  [90-105] 105 (05/12 0707) Resp:  [7-25] 19 (05/12 0707) BP: (101-153)/(51-91) 142/85 mmHg (05/12 0700) SpO2:  [88 %-100 %] 96 % (05/12 0707) Arterial Line BP: (102-182)/(52-73) 168/72 mmHg (05/12 0700) FiO2 (%):  [40 %] 40 % (05/11 0940) Weight:  [94.9 kg (209 lb 3.5 oz)] 94.9 kg (209 lb 3.5 oz) (05/12 0600) Last BM Date: 08/25/15  Intake/Output from previous day: 05/11 0701 - 05/12 0700 In: 2335.8 [I.V.:668.3; NG/GT:60; TPN:1607.5] Out: 2145 [Urine:2145] Intake/Output this shift:    General appearance: alert, cooperative, appears stated age, no distress and mildly obese GI: Soft.  Appropriately tender to palpation adjacent to incision.  Distended.  Tympanitic.  Dressing over midline with some shadowing.  Lab Results:   Recent Labs  08/26/15 0436 08/26/15 1701 08/27/15 0500  WBC 6.6  --  12.4*  HGB 8.7* 9.2* 8.6*  HCT 25.6* 27.0* 26.1*  PLT 153  --  228   BMET  Recent Labs  08/26/15 0436 08/26/15 1701 08/27/15 0500  NA 133* 134* 136  K 4.0 4.2 3.8  CL 99* 96* 98*  CO2 27  --  27  GLUCOSE 191* 118* 142*  BUN 14 18 18   CREATININE 1.02 0.80 0.95  CALCIUM 7.9*  --  8.4*   PT/INR  Recent Labs  08/25/15 0955  LABPROT 16.3*  INR 1.29   ABG  Recent Labs  08/26/15 1249 08/27/15 0605  PHART 7.469* 7.461*  HCO3 29.5* 27.0*    Studies/Results: Dg Chest Port 1 View  08/27/2015  CLINICAL DATA:  Status post exploratory laparotomy and ascending and transverse colectomy ; status post CABG on Aug 20, 2015 EXAM: PORTABLE CHEST 1 VIEW COMPARISON:  Portable chest x-ray dated Aug 26, 2015 FINDINGS: The lungs are reasonably well inflated. Improving left basilar atelectasis is noted. There are trace bilateral pleural effusions. There is no  pneumothorax. The trachea and esophagus have been extubated. The cardiac silhouette remains enlarged. The left atrial appendage clip is in stable position. The pulmonary vascularity is mildly prominent centrally. The right-sided PICC line tip projects over the proximal SVC. IMPRESSION: Improving left lower lobe atelectasis. Stable cardiomegaly with mild central pulmonary vascular prominence. No pneumothorax or large pleural effusion. Electronically Signed   By: David  Martinique M.D.   On: 08/27/2015 08:06   Dg Chest Port 1 View  08/26/2015  CLINICAL DATA:  Postop partial colectomy for cecal volvulus. EXAM: PORTABLE CHEST 1 VIEW COMPARISON:  08/25/2015 radiographs. FINDINGS: 0614 hours. The endotracheal tube, right arm PICC and nasogastric tube appear unchanged. There is stable cardiomegaly status post median sternotomy, CABG and left atrial appendage clipping. No significant change in left-greater-than-right basilar airspace opacities. No pneumothorax or significant pleural effusion. IMPRESSION: Stable postoperative chest with probable mild bibasilar atelectasis. No pneumothorax. Electronically Signed   By: Richardean Sale M.D.   On: 08/26/2015 07:39   Dg Chest Port 1 View  08/25/2015  CLINICAL DATA:  Intubation EXAM: PORTABLE CHEST 1 VIEW COMPARISON:  Portable exam 1508 hours compared to 08/25/2015 FINDINGS: Tip of endotracheal tube projects 2.7 cm above carina. Nasogastric tube extends into stomach. RIGHT arm PICC line tip projects over SVC the level of the carina. Enlargement of cardiac silhouette post CABG and clipping of LEFT atrial appendage. Stent identified at expected position  of the origin of the LEFT subclavian artery. Atelectasis versus consolidation LEFT lower lobe. Minimal RIGHT basilar atelectasis. Lungs otherwise clear. No gross pleural effusion or pneumothorax. IMPRESSION: Enlargement of cardiac silhouette with pulmonary vascular congestion post CABG and LEFT atrial appendage clipping.  Atelectasis versus consolidation LEFT lower lobe with minimal RIGHT basilar atelectasis. Line and tube positions as above. Electronically Signed   By: Lavonia Dana M.D.   On: 08/25/2015 15:21   Dg Abd Portable 1v  08/27/2015  CLINICAL DATA:  History of recent abdominal surgery EXAM: PORTABLE ABDOMEN - 1 VIEW COMPARISON:  08/26/2015 FINDINGS: Scattered large and small bowel gas is noted. No obstructive changes are seen. No free air is noted. Postsurgical changes are seen. No acute bony abnormality is noted. IMPRESSION: No acute abnormality seen. Electronically Signed   By: Inez Catalina M.D.   On: 08/27/2015 08:05   Dg Abd Portable 1v  08/26/2015  CLINICAL DATA:  Postop partial colectomy for cecal volvulus. EXAM: PORTABLE ABDOMEN - 1 VIEW COMPARISON:  08/25/2015 and 08/24/2015 radiographs. FINDINGS: 0617 hours. Interval surgery manifesting as midline skin staples. No significant residual bowel distention identified. No supine evidence of free intraperitoneal air. Nasogastric tube tip terminates over the left upper quadrant of the abdomen. Lumbar spondylosis and left hip arthroplasty noted. IMPRESSION: Interval abdominal surgery without demonstrated complication. No significant residual bowel distention identified. Electronically Signed   By: Richardean Sale M.D.   On: 08/26/2015 07:41    Anti-infectives: Anti-infectives    Start     Dose/Rate Route Frequency Ordered Stop   08/26/15 1000  cefoTEtan (CEFOTAN) 1 g in dextrose 5 % 50 mL IVPB     1 g 100 mL/hr over 30 Minutes Intravenous  Once 08/26/15 0853 08/26/15 1057   08/25/15 1115  cefoTEtan in Dextrose 5% (CEFOTAN) IVPB 2 g     2 g Intravenous  Once 08/25/15 1111 08/25/15 1150   08/25/15 1113  cefoTEtan in Dextrose 5% (CEFOTAN) 2-2.08 GM-% IVPB    Comments:  [name]   : cabinet override      08/25/15 1113 08/25/15 2329   08/21/15 0400  cefUROXime (ZINACEF) 1.5 g in dextrose 5 % 50 mL IVPB     1.5 g 100 mL/hr over 30 Minutes Intravenous  Every 12 hours 08/20/15 1742 08/22/15 1630   08/20/15 2330  vancomycin (VANCOCIN) IVPB 1000 mg/200 mL premix     1,000 mg 200 mL/hr over 60 Minutes Intravenous Every 12 hours 08/20/15 1827 08/21/15 2330   08/20/15 2315  vancomycin (VANCOCIN) IVPB 1000 mg/200 mL premix  Status:  Discontinued     1,000 mg 200 mL/hr over 60 Minutes Intravenous  Once 08/20/15 1742 08/20/15 1827   08/20/15 0400  vancomycin (VANCOCIN) 1,500 mg in sodium chloride 0.9 % 250 mL IVPB  Status:  Discontinued     1,500 mg 125 mL/hr over 120 Minutes Intravenous To Surgery 08/19/15 1605 08/20/15 1523   08/20/15 0400  cefUROXime (ZINACEF) 1.5 g in dextrose 5 % 50 mL IVPB  Status:  Discontinued     1.5 g 100 mL/hr over 30 Minutes Intravenous To Surgery 08/19/15 1605 08/20/15 1523   08/20/15 0400  cefUROXime (ZINACEF) 750 mg in dextrose 5 % 50 mL IVPB  Status:  Discontinued     750 mg 100 mL/hr over 30 Minutes Intravenous To Surgery 08/19/15 1605 08/20/15 1523      Assessment/Plan: s/p Procedure(s): EXPLORATORY LAPAROTOMY (N/A) ASCENDING AND TRANSVERSE COLECTOMY (N/A) POD 2 s/p exlap and right hemicolectomy for  cecal volvulus  Still awaiting return of bowel function.  Would continue with NPO status and TPN given his protein calorie malnutrition. Encourage ambulation and continued work with PT. Foley can be discontinued whenever CT feels appropriate.  LOS: 8 days    Reino Kent 08/27/2015

## 2015-08-27 NOTE — Progress Notes (Addendum)
Physical Therapy Treatment Patient Details Name: Victor Diaz MRN: [id number] DOB: 08-27-1945 Today's Date: 08/27/2015    History of Present Illness pt presents with CABG x4 on 5/6; Rt hemicolectomy 5/10.  pt with hx of heavy Etoh use, L THA, Gout, HTN, and A-Flutter.      PT Comments    Patient anxious re: OOB and walking. Ultimately did extremely well s/p second major surgery. Required incr O2 to 6L while walking to maintain SaO2 >90% (?accuracy of pulse ox). Seated at rest able to return patient to 4L.   Follow Up Recommendations  CIR     Equipment Recommendations  None recommended by PT    Recommendations for Other Services       Precautions / Restrictions Precautions Precautions: Fall;Sternal Restrictions Weight Bearing Restrictions: No    Mobility  Bed Mobility Overal bed mobility: Needs Assistance Bed Mobility: Rolling;Sidelying to Sit Rolling: Mod assist Sidelying to sit: Mod assist;HOB elevated       General bed mobility comments: max vc for sequencing to incr his independence  Transfers Overall transfer level: Needs assistance Equipment used: Pushed w/c Transfers: Sit to/from Stand Sit to Stand: Min assist         General transfer comment: vc for sternal precautions; steady assist  Ambulation/Gait Ambulation/Gait assistance: Min guard Ambulation Distance (Feet): 150 Feet Assistive device:  (pushed W/C) Gait Pattern/deviations: Step-through pattern;Decreased stride length;Trunk flexed     General Gait Details: moving quickly, almost too quickly for his respiratory status   Stairs            Wheelchair Mobility    Modified Rankin (Stroke Patients Only)       Balance Overall balance assessment: Needs assistance   Sitting balance-Leahy Scale: Fair     Standing balance support: No upper extremity supported Standing balance-Leahy Scale: Poor                      Cognition Arousal/Alertness: Awake/alert Behavior  During Therapy: WFL for tasks assessed/performed Overall Cognitive Status: Within Functional Limits for tasks assessed                      Exercises Other Exercises Other Exercises: instructed to do ankle pumps, hand pumps, and incentive spriormeter throughout the day    General Comments        Pertinent Vitals/Pain HR 83-94 SaO2 100% on 4L; decr to ?89% walking; incr to 6L and maintained >90%  Pain Assessment: 0-10 Pain Score: 4  Pain Location: abd Pain Descriptors / Indicators: Operative site guarding;Grimacing Pain Intervention(s): Limited activity within patient's tolerance;Monitored during session;Repositioned    Home Living                      Prior Function            PT Goals (current goals can now be found in the care plan section) Acute Rehab PT Goals Patient Stated Goal: Walk PT Goal Formulation: With patient Time For Goal Achievement: 09/05/15 Potential to Achieve Goals: Good Progress towards PT goals: Progressing toward goals (goals remain appropriate)    Frequency  Min 3X/week    PT Plan Current plan remains appropriate    Co-evaluation             End of Session Equipment Utilized During Treatment: Gait belt;Oxygen Activity Tolerance: Patient tolerated treatment well Patient left: in chair;with call bell/phone within reach     Time: [id number] PT Time Calculation (  min) (ACUTE ONLY): 36 min  Charges:  $Gait Training: 8-22 mins $Therapeutic Activity: 8-22 mins                    G Codes:      Vadim Centola Sep 03, 2015, 9:37 AM  Pager [phone]

## 2015-08-27 NOTE — Progress Notes (Signed)
PARENTERAL NUTRITION CONSULT NOTE - INITIAL  Pharmacy Consult for TPN Indication: ileus  Allergies  Allergen Reactions  . Atorvastatin Anaphylaxis    LIPITOR -  facial swelling    Patient Measurements: Height: 5\' 6"  (167.6 cm) Weight: 209 lb 3.5 oz (94.9 kg) IBW/kg (Calculated) : 63.8 Adjusted Body Weight: 76 kg Usual Weight: 94.4 kg  Vital Signs: Temp: 98.5 F (36.9 C) (05/12 0411) Temp Source: Oral (05/12 0411) BP: 142/85 mmHg (05/12 0700) Pulse Rate: 105 (05/12 0707) Intake/Output from previous day: 05/11 0701 - 05/12 0700 In: 2335.8 [I.V.:668.3; NG/GT:60; TPN:1607.5] Out: 2145 [Urine:2145] Intake/Output from this shift:    Labs:  Recent Labs  08/25/15 0955 08/25/15 1521 08/26/15 0436 08/26/15 1701 08/27/15 0500  WBC  --  7.8 6.6  --  12.4*  HGB  --  9.8* 8.7* 9.2* 8.6*  HCT  --  29.9* 25.6* 27.0* 26.1*  PLT  --  198 153  --  228  INR 1.29  --   --   --   --      Recent Labs  08/25/15 0433 08/25/15 0435 08/25/15 1521 08/26/15 0436 08/26/15 1701 08/27/15 0500  NA 130*  --  131* 133* 134* 136  K 4.1  --  4.5 4.0 4.2 3.8  CL 95*  --  100* 99* 96* 98*  CO2 25  --  24 27  --  27  GLUCOSE 98  --  138* 191* 118* 142*  BUN 11  --  12 14 18 18   CREATININE 0.88  --  0.96 1.02 0.80 0.95  CALCIUM 8.0*  --  7.6* 7.9*  --  8.4*  MG 2.2  --   --  2.1  --   --   PHOS 2.2*  --   --  2.2*  --   --   PROT 5.2*  --   --  5.1*  --  5.5*  ALBUMIN 2.6*  --   --  2.3*  --  2.3*  AST 27  --   --  22  --  22  ALT 17  --   --  15*  --  15*  ALKPHOS 32*  --   --  28*  --  32*  BILITOT 1.0  --   --  0.8  --  0.8  PREALBUMIN 7.1*  --   --   --   --   --   TRIG  --  83  --   --   --   --    Estimated Creatinine Clearance: 76.9 mL/min (by C-G formula based on Cr of 0.95).    Recent Labs  08/26/15 2022 08/27/15 0025 08/27/15 0400  GLUCAP 155* 134* 132*    Medical History: Past Medical History  Diagnosis Date  . Allergy   . Gout   . Hyperlipidemia   .  Alcohol abuse   . Hemothorax on right   . Lisfranc's dislocation 10/20/2011  . Right fibular fracture 10/20/2011  . Essential hypertension   . PSVT (paroxysmal supraventricular tachycardia) ([place])   . Paroxysmal atrial flutter (HCC)     Poor anticoagulation candidate with active alcohol abuse and syncope  . Subclavian artery stenosis, left     Status post stent placement by Dr. Trula Slade 10/2014    Medications:  Prescriptions prior to admission  Medication Sig Dispense Refill Last Dose  . aspirin 325 MG tablet Take 325 mg by mouth daily.    08/19/2015 at 0530  . cetirizine (ZYRTEC)  10 MG tablet TAKE ONE TABLET BY MOUTH ONCE DAILY AS NEEDED. (Patient taking differently: Take 10 mg by mouth daily as needed for allergies. TAKE ONE TABLET BY MOUTH ONCE DAILY AS NEEDED.) 60 tablet 0 08/18/2015 at Unknown time  . metoprolol succinate (TOPROL-XL) 25 MG 24 hr tablet **MUST SCHEDULE APPT FOR FURTHER REFILLS.** (Patient taking differently: Take 25 mg by mouth daily. **MUST SCHEDULE APPT FOR FURTHER REFILLS.**) 180 tablet 0 08/18/2015 at 1600  . nitroGLYCERIN (NITROSTAT) 0.4 MG SL tablet Place 1 tablet (0.4 mg total) under the tongue every 5 (five) minutes as needed. 25 tablet 3 08/19/2015 at 0400  . omeprazole (PRILOSEC) 20 MG capsule TAKE 1 CAPSULE BY MOUTH DAILY AS NEEDED FOR HEART BURN. 90 capsule 1 08/18/2015 at 2000  . simvastatin (ZOCOR) 40 MG tablet TAKE (1) TABLET BY MOUTH AT BEDTIME. 90 tablet 1 08/18/2015 at 2000  . Multiple Vitamin (MULTIVITAMIN WITH MINERALS) TABS tablet Take 1 tablet by mouth daily. 30 tablet 0 Taking    Insulin Requirements in the past 24 hours:  40 units of Levemir 19 units of SSI  Assessment: Pt is s/p CABG on 5/5, pt begun complaining of abdominal pain and distention; KUB showed ileus with cecum measuring 15 cm. Surgery was consulted for a possible volvulus. Has a history of ileus that resolved after a couple days s/p prior surgery.  GI: ileus dilated cecum to 15 cm. Ex lap on 5/10  showed cecal volvulus. Had R hemicolectomy. Albumin low at 2.3. Plan to await return of bowel function. Last abdominal Xray showed no significant residual distention. Endo: CBGs jumped up yesterday (130-150s) on SSI + Levemir 20 units BID Lytes: wnl exc K at 3.8 (goal > 4) on KCL 19mEq daily. Phos borderline low at 2.2, Mg ok at 2.1 (goal > 2) CoCa 9.6 Renal: SCr stable, CrCl ~75ml/min. UOP decreased yesterday. D5NS @ 10 Pulm: Extubated on 5/11. Now on 4L of [place] Cards: 4 days out from CABG - VSS, intermittent AFib. Asa, Lasix, lopressor, statin Hepatobil: LFTs/Tbili wnl, TG 83 Neuro: hx EtOH abuse, taking beer BID while admitted to prevent withdrawal  ID: pre-op abx  Best Practices: Lovenox  TPN Access: PICC placed 5/8 TPN start date: 08/25/15  Nutritional Goals: per RD recs on 5/11 Kcal: 2100-2300 Protein: 115-125 g  Current Nutrition:  NPO Clinimix E 5/15 at 67ml/hr IV lipid emulsions at 90ml/hr  Plan:  Increase Clinimix E 5/15 to 53ml/hr Continue 20% lipid emulsion at 58ml/hr D5NS at 10 ml/hr This provides 100 g of protein and 1894 kCals per day meeting 83% of protein needs and 86% of kcal needs Add MVI and TE in TPN Add thiamine and folic acid to TPN Continue resistant SSI and adjust as needed Levemir increased to 20 units BID per MD Monitor TPN labs, daily Cmet F/U return of bowel function  Give extra KCl 68mEq x 3 runs  Elenor Quinones, PharmD, [facility] Clinical Pharmacist Pager [phone] 08/27/2015 7:31 AM

## 2015-08-27 NOTE — Progress Notes (Signed)
Patient ID: Victor Diaz, male   DOB: 05/03/1945, 70 y.o.   MRN: [id number] EVENING ROUNDS NOTE :     [address]       [place], [address]             [phone]                 2 Days Post-Op Procedure(s) (LRB): EXPLORATORY LAPAROTOMY (N/A) ASCENDING AND TRANSVERSE COLECTOMY (N/A)  Total Length of Stay:  LOS: 8 days  BP 156/78 mmHg  Pulse 105  Temp(Src) 97.5 F (36.4 C) (Oral)  Resp 22  Ht 5\' 6"  (1.676 m)  Wt 209 lb 3.5 oz (94.9 kg)  BMI 33.78 kg/m2  SpO2 100%  .Intake/Output      05/11 0701 - 05/12 0700 05/12 0701 - 05/13 0700   I.V. (mL/kg) 668.3 (7) 373.6 (3.9)   Blood     Other     NG/GT 60    IV Piggyback  150   TPN 1607.5 720   Total Intake(mL/kg) 2335.8 (24.6) 1243.6 (13.1)   Urine (mL/kg/hr) 2145 (0.9) 1445 (1.4)   Emesis/NG output 0 (0)    Stool     Blood     Total Output 2145 1445   Net +190.8 -201.4          . sodium chloride Stopped (08/25/15 1214)  . amiodarone 30 mg/hr (08/27/15 0939)  . dextrose 5 % and 0.9% NaCl 10 mL/hr (08/27/15 0930)  . Marland KitchenTPN (CLINIMIX-E) Adult 70 mL/hr at 08/26/15 1728   And  . fat emulsion 240 mL (08/26/15 1729)  . Marland KitchenTPN (CLINIMIX-E) Adult     And  . fat emulsion 240 mL (08/27/15 1738)  . lactated ringers 10 mL/hr at 08/25/15 1000  . phenylephrine (NEO-SYNEPHRINE) Adult infusion Stopped (08/25/15 1700)     Lab Results  Component Value Date   WBC 12.4* 08/27/2015   HGB 8.6* 08/27/2015   HCT 26.1* 08/27/2015   PLT 228 08/27/2015   GLUCOSE 142* 08/27/2015   CHOL 160 11/19/2013   TRIG 83 08/25/2015   HDL 36.50* 11/19/2013   LDLDIRECT 85.3 12/06/2012   LDLCALC 88 11/19/2013   ALT 15* 08/27/2015   AST 22 08/27/2015   NA 136 08/27/2015   K 3.8 08/27/2015   CL 98* 08/27/2015   CREATININE 0.95 08/27/2015   BUN 18 08/27/2015   CO2 27 08/27/2015   TSH 3.418 10/14/2014   PSA 0.47 11/03/2011   INR 1.29 08/25/2015   HGBA1C 5.6 08/19/2015   Bowel sounds present , npo on tpn  Grace Isaac MD  Beeper [phone] Office [phone] 08/27/2015 5:38 PM

## 2015-08-27 NOTE — Progress Notes (Signed)
2 Days Post-Op Procedure(s) (LRB): EXPLORATORY LAPAROTOMY (N/A) ASCENDING AND TRANSVERSE COLECTOMY (N/A) Subjective: Status post urgent CABG 10 days ago, unstable angina Past history of heavy alcohol abuse and unsuccessful detoxification Postop cecal ileus requiring right colectomy may 10 with primary anastomosis Currently patient on TPN, NG tube is out, no flatus yet KUB shows mild small bowel gas pattern Abdomen is nontender Patient remains on IV cefotetan for necrosis of the colonic wall at time of surgery Patient has postop atrial fibrillation on IV amiodarone Patient had preop history of intermittent atrial fibrillation but was not a candidate for Coumadin because of his heavy alcohol use  Patient had a stable night, no abdominal complaints or emesis Patient we mobilize out of bed to the chair We'll continue TPN until he resumes a adequate diet Continue IV amiodarone, IV beta blockers Daily IV Lasix to maintain balanced input and output   Objective: Vital signs in last 24 hours: Temp:  [97 F (36.1 C)-99.1 F (37.3 C)] 99.1 F (37.3 C) (05/12 0800) Pulse Rate:  [90-105] 105 (05/12 0707) Cardiac Rhythm:  [-] Normal sinus rhythm;Atrial flutter (05/12 0800) Resp:  [7-25] 19 (05/12 0707) BP: (101-153)/(51-91) 142/85 mmHg (05/12 0700) SpO2:  [88 %-100 %] 96 % (05/12 0707) Arterial Line BP: (102-182)/(52-73) 168/72 mmHg (05/12 0700) FiO2 (%):  [40 %] 40 % (05/11 0940) Weight:  [209 lb 3.5 oz (94.9 kg)] 209 lb 3.5 oz (94.9 kg) (05/12 0600)  Hemodynamic parameters for last 24 hours:  stable  Intake/Output from previous day: 05/11 0701 - 05/12 0700 In: 2335.8 [I.V.:668.3; NG/GT:60; TPN:1607.5] Out: 2145 [Urine:2145] Intake/Output this shift: Total I/O In: -  Out: 190 [Urine:190]  Alert comfortable Sternal incision clean and dry Abdomen mildly distended but nontender Lungs clear Mild peripheral edema Neuro intact  Lab Results:  Recent Labs  08/26/15 0436  08/26/15 1701 08/27/15 0500  WBC 6.6  --  12.4*  HGB 8.7* 9.2* 8.6*  HCT 25.6* 27.0* 26.1*  PLT 153  --  228   BMET:  Recent Labs  08/26/15 0436 08/26/15 1701 08/27/15 0500  NA 133* 134* 136  K 4.0 4.2 3.8  CL 99* 96* 98*  CO2 27  --  27  GLUCOSE 191* 118* 142*  BUN 14 18 18   CREATININE 1.02 0.80 0.95  CALCIUM 7.9*  --  8.4*    PT/INR:  Recent Labs  08/25/15 0955  LABPROT 16.3*  INR 1.29   ABG    Component Value Date/Time   PHART 7.461* 08/27/2015 0605   HCO3 27.0* 08/27/2015 0605   TCO2 28.1 08/27/2015 0605   ACIDBASEDEF 1.0 08/25/2015 1503   O2SAT 98.8 08/27/2015 0605   CBG (last 3)   Recent Labs  08/26/15 2022 08/27/15 0025 08/27/15 0400  GLUCAP 155* 134* 132*    Assessment/Plan: S/P Procedure(s) (LRB): EXPLORATORY LAPAROTOMY (N/A) ASCENDING AND TRANSVERSE COLECTOMY (N/A) Mobilize Diuresis Diabetes control Continue IV antibiotics for focal colonic necrosis   LOS: 8 days    Victor Diaz 08/27/2015

## 2015-08-28 ENCOUNTER — Inpatient Hospital Stay (HOSPITAL_COMMUNITY): Payer: Medicare Other

## 2015-08-28 LAB — GLUCOSE, CAPILLARY
GLUCOSE-CAPILLARY: 125 mg/dL — AB (ref 65–99)
GLUCOSE-CAPILLARY: 126 mg/dL — AB (ref 65–99)
GLUCOSE-CAPILLARY: 128 mg/dL — AB (ref 65–99)
Glucose-Capillary: 123 mg/dL — ABNORMAL HIGH (ref 65–99)
Glucose-Capillary: 127 mg/dL — ABNORMAL HIGH (ref 65–99)
Glucose-Capillary: 131 mg/dL — ABNORMAL HIGH (ref 65–99)
Glucose-Capillary: 125 mg/dL — ABNORMAL HIGH (ref 65–99)

## 2015-08-28 LAB — BASIC METABOLIC PANEL
Anion gap: 10 (ref 5–15)
BUN: 22 mg/dL — ABNORMAL HIGH (ref 6–20)
CO2: 27 mmol/L (ref 22–32)
Calcium: 8.4 mg/dL — ABNORMAL LOW (ref 8.9–10.3)
Chloride: 100 mmol/L — ABNORMAL LOW (ref 101–111)
Creatinine, Ser: 0.93 mg/dL (ref 0.61–1.24)
GFR calc Af Amer: >60 mL/min (ref >60)
GFR calc non Af Amer: >60 mL/min (ref >60)
Glucose, Bld: 133 mg/dL — ABNORMAL HIGH (ref 65–99)
Potassium: 3.7 mmol/L (ref 3.5–5.1)
Sodium: 137 mmol/L (ref 135–145)

## 2015-08-28 MED ORDER — FAT EMULSION 20 % IV EMUL
240.0000 mL | INTRAVENOUS | Status: AC
  Administered 2015-08-28: 240 mL via INTRAVENOUS
  Filled 2015-08-28: qty 250

## 2015-08-28 MED ORDER — TRACE MINERALS CR-CU-MN-SE-ZN 10-1000-500-60 MCG/ML IV SOLN
INTRAVENOUS | Status: AC
  Administered 2015-08-28: 18:00:00 via INTRAVENOUS
  Filled 2015-08-28: qty 2400

## 2015-08-28 MED ORDER — POTASSIUM CHLORIDE 10 MEQ/50ML IV SOLN
10.0000 meq | INTRAVENOUS | Status: AC
  Administered 2015-08-28 (×3): 10 meq via INTRAVENOUS
  Filled 2015-08-28 (×3): qty 50

## 2015-08-28 NOTE — Progress Notes (Addendum)
3 Days Post-Op  Subjective: POD 3 s/p exlap an R hemicolectomy for Cecal volvulus. NAE overnight.  Ambulated in unit once yesterday. No bowel function.  No flatus/BM. NG removed with transition to ice chips and water.   Foley dc'd with void x 1.  Objective: Vital signs in last 24 hours: Temp:  [97.5 F (36.4 C)-98.9 F (37.2 C)] 98.4 F (36.9 C) (05/13 0305) Pulse Rate:  [84] 84 (05/13 0711) Resp:  [12-25] 21 (05/13 0711) BP: (123-165)/(57-80) 135/59 mmHg (05/13 0700) SpO2:  [97 %-100 %] 100 % (05/13 0711) Arterial Line BP: (154)/(65) 154/65 mmHg (05/12 0900) Weight:  [100.8 kg (222 lb 3.6 oz)] 100.8 kg (222 lb 3.6 oz) (05/13 0600) Last BM Date: 08/25/15  Intake/Output from previous day: 05/12 0701 - 05/13 0700 In: 5345.8 [P.O.:120; I.V.:3040.9; IV Piggyback:150; TPN:2034.9] Out: 1975 [id number] Intake/Output this shift: Total I/O In: 119.7 [I.V.:26.7; TPN:93] Out: -   General appearance: alert, cooperative, appears stated age, no distress and moderately obese Resp: Nonlabored respirations with bilater chest wall expansion GI: Soft.  Nontender.  Mildly distended.  No rebound or guarding.  Incision with honeycomb dressing in place.  mild shadowing.    Lab Results:   Recent Labs  08/26/15 0436 08/26/15 1701 08/27/15 0500  WBC 6.6  --  12.4*  HGB 8.7* 9.2* 8.6*  HCT 25.6* 27.0* 26.1*  PLT 153  --  228   BMET  Recent Labs  08/26/15 0436 08/26/15 1701 08/27/15 0500  NA 133* 134* 136  K 4.0 4.2 3.8  CL 99* 96* 98*  CO2 27  --  27  GLUCOSE 191* 118* 142*  BUN 14 18 18   CREATININE 1.02 0.80 0.95  CALCIUM 7.9*  --  8.4*   PT/INR  Recent Labs  08/25/15 0955  LABPROT 16.3*  INR 1.29   ABG  Recent Labs  08/26/15 1249 08/27/15 0605  PHART 7.469* 7.461*  HCO3 29.5* 27.0*    Studies/Results: Dg Chest Port 1 View  08/28/2015  CLINICAL DATA:  Short of breath with exertion EXAM: PORTABLE CHEST 1 VIEW COMPARISON:  Aug 27, 2015 FINDINGS: Stable  cardiomegaly. The right PICC line is stable. No pneumothorax. No pulmonary nodules, masses, or focal infiltrates. No significant interval change. IMPRESSION: No significant interval change. Electronically Signed   By: Dorise Bullion III M.D   On: 08/28/2015 07:12   Dg Chest Port 1 View  08/27/2015  CLINICAL DATA:  Status post exploratory laparotomy and ascending and transverse colectomy ; status post CABG on Aug 20, 2015 EXAM: PORTABLE CHEST 1 VIEW COMPARISON:  Portable chest x-ray dated Aug 26, 2015 FINDINGS: The lungs are reasonably well inflated. Improving left basilar atelectasis is noted. There are trace bilateral pleural effusions. There is no pneumothorax. The trachea and esophagus have been extubated. The cardiac silhouette remains enlarged. The left atrial appendage clip is in stable position. The pulmonary vascularity is mildly prominent centrally. The right-sided PICC line tip projects over the proximal SVC. IMPRESSION: Improving left lower lobe atelectasis. Stable cardiomegaly with mild central pulmonary vascular prominence. No pneumothorax or large pleural effusion. Electronically Signed   By: David  Martinique M.D.   On: 08/27/2015 08:06   Dg Abd Portable 1v  08/27/2015  CLINICAL DATA:  History of recent abdominal surgery EXAM: PORTABLE ABDOMEN - 1 VIEW COMPARISON:  08/26/2015 FINDINGS: Scattered large and small bowel gas is noted. No obstructive changes are seen. No free air is noted. Postsurgical changes are seen. No acute bony abnormality is noted. IMPRESSION: No  acute abnormality seen. Electronically Signed   By: Inez Catalina M.D.   On: 08/27/2015 08:05    Anti-infectives: Anti-infectives    Start     Dose/Rate Route Frequency Ordered Stop   08/26/15 1000  cefoTEtan (CEFOTAN) 1 g in dextrose 5 % 50 mL IVPB     1 g 100 mL/hr over 30 Minutes Intravenous  Once 08/26/15 0853 08/26/15 1057   08/25/15 1115  cefoTEtan in Dextrose 5% (CEFOTAN) IVPB 2 g     2 g Intravenous  Once 08/25/15 1111  08/25/15 1150   08/25/15 1113  cefoTEtan in Dextrose 5% (CEFOTAN) 2-2.08 GM-% IVPB    Comments:  [name]   : cabinet override      08/25/15 1113 08/25/15 2329   08/21/15 0400  cefUROXime (ZINACEF) 1.5 g in dextrose 5 % 50 mL IVPB     1.5 g 100 mL/hr over 30 Minutes Intravenous Every 12 hours 08/20/15 1742 08/22/15 1630   08/20/15 2330  vancomycin (VANCOCIN) IVPB 1000 mg/200 mL premix     1,000 mg 200 mL/hr over 60 Minutes Intravenous Every 12 hours 08/20/15 1827 08/21/15 2330   08/20/15 2315  vancomycin (VANCOCIN) IVPB 1000 mg/200 mL premix  Status:  Discontinued     1,000 mg 200 mL/hr over 60 Minutes Intravenous  Once 08/20/15 1742 08/20/15 1827   08/20/15 0400  vancomycin (VANCOCIN) 1,500 mg in sodium chloride 0.9 % 250 mL IVPB  Status:  Discontinued     1,500 mg 125 mL/hr over 120 Minutes Intravenous To Surgery 08/19/15 1605 08/20/15 1523   08/20/15 0400  cefUROXime (ZINACEF) 1.5 g in dextrose 5 % 50 mL IVPB  Status:  Discontinued     1.5 g 100 mL/hr over 30 Minutes Intravenous To Surgery 08/19/15 1605 08/20/15 1523   08/20/15 0400  cefUROXime (ZINACEF) 750 mg in dextrose 5 % 50 mL IVPB  Status:  Discontinued     750 mg 100 mL/hr over 30 Minutes Intravenous To Surgery 08/19/15 1605 08/20/15 1523      Assessment/Plan: s/p Procedure(s): EXPLORATORY LAPAROTOMY (N/A) ASCENDING AND TRANSVERSE COLECTOMY (N/A) POD 3 s/p exlap and R hemicolectomy.  -Pain appears to be decently well controlled. -Given no return of bowel function and mild distention, would hold with ice chips and water for the meantime.  If demonstrates return of function with passage of flatus or BM, would consider advancement to CLD. -Continue to encourage ambulation. -No need for further abx.  There was some cecal wall ischemia, but no perforation.  LOS: 9 days    Reino Kent 08/28/2015  Ice chips and sips until bowel function. TNA I also D/W Dr. Servando Snare. Patient examined and I agree with the  assessment and plan  Georganna Skeans, MD, MPH, FACS Trauma: [phone] General Surgery: [phone]  08/28/2015 11:57 AM

## 2015-08-28 NOTE — Progress Notes (Signed)
PARENTERAL NUTRITION CONSULT NOTE - INITIAL  Pharmacy Consult for TPN Indication: ileus  Allergies  Allergen Reactions  . Atorvastatin Anaphylaxis    LIPITOR -  facial swelling    Patient Measurements: Height: 5\' 6"  (167.6 cm) Weight: 222 lb 3.6 oz (100.8 kg) IBW/kg (Calculated) : 63.8 Adjusted Body Weight: 76 kg Usual Weight: 94.4 kg  Vital Signs: Temp: 98.4 F (36.9 C) (05/13 0305) Temp Source: Oral (05/13 0305) BP: 142/65 mmHg (05/13 0600) Pulse Rate: 84 (05/13 0711) Intake/Output from previous day: 05/12 0701 - 05/13 0700 In: 15464.1 [P.O.:120; I.V.:14348.2; IV Piggyback:150; TPN:845.9] Out: 1975 [id number] Intake/Output from this shift:    Labs:  Recent Labs  08/25/15 0955  08/25/15 1521 08/26/15 0436 08/26/15 1701 08/27/15 0500  WBC  --   --  7.8 6.6  --  12.4*  HGB  --   < > 9.8* 8.7* 9.2* 8.6*  HCT  --   < > 29.9* 25.6* 27.0* 26.1*  PLT  --   --  198 153  --  228  INR 1.29  --   --   --   --   --   < > = values in this interval not displayed.   Recent Labs  08/25/15 1521 08/26/15 0436 08/26/15 1701 08/27/15 0500  NA 131* 133* 134* 136  K 4.5 4.0 4.2 3.8  CL 100* 99* 96* 98*  CO2 24 27  --  27  GLUCOSE 138* 191* 118* 142*  BUN 12 14 18 18   CREATININE 0.96 1.02 0.80 0.95  CALCIUM 7.6* 7.9*  --  8.4*  MG  --  2.1  --   --   PHOS  --  2.2*  --   --   PROT  --  5.1*  --  5.5*  ALBUMIN  --  2.3*  --  2.3*  AST  --  22  --  22  ALT  --  15*  --  15*  ALKPHOS  --  28*  --  32*  BILITOT  --  0.8  --  0.8   Estimated Creatinine Clearance: 79.3 mL/min (by C-G formula based on Cr of 0.95).    Recent Labs  08/27/15 2020 08/27/15 2329 08/28/15 0305  GLUCAP 127* 123* 127*    Medical History: Past Medical History  Diagnosis Date  . Allergy   . Gout   . Hyperlipidemia   . Alcohol abuse   . Hemothorax on right   . Lisfranc's dislocation 10/20/2011  . Right fibular fracture 10/20/2011  . Essential hypertension   . PSVT (paroxysmal  supraventricular tachycardia) ([place])   . Paroxysmal atrial flutter ([facility])     Poor anticoagulation candidate with active alcohol abuse and syncope  . Subclavian artery stenosis, left     Status post stent placement by Dr. Trula Slade 10/2014    Medications:  Prescriptions prior to admission  Medication Sig Dispense Refill Last Dose  . aspirin 325 MG tablet Take 325 mg by mouth daily.    08/19/2015 at 0530  . cetirizine (ZYRTEC) 10 MG tablet TAKE ONE TABLET BY MOUTH ONCE DAILY AS NEEDED. (Patient taking differently: Take 10 mg by mouth daily as needed for allergies. TAKE ONE TABLET BY MOUTH ONCE DAILY AS NEEDED.) 60 tablet 0 08/18/2015 at Unknown time  . metoprolol succinate (TOPROL-XL) 25 MG 24 hr tablet **MUST SCHEDULE APPT FOR FURTHER REFILLS.** (Patient taking differently: Take 25 mg by mouth daily. **MUST SCHEDULE APPT FOR FURTHER REFILLS.**) 180 tablet 0 08/18/2015 at 1600  .  nitroGLYCERIN (NITROSTAT) 0.4 MG SL tablet Place 1 tablet (0.4 mg total) under the tongue every 5 (five) minutes as needed. 25 tablet 3 08/19/2015 at 0400  . omeprazole (PRILOSEC) 20 MG capsule TAKE 1 CAPSULE BY MOUTH DAILY AS NEEDED FOR HEART BURN. 90 capsule 1 08/18/2015 at 2000  . simvastatin (ZOCOR) 40 MG tablet TAKE (1) TABLET BY MOUTH AT BEDTIME. 90 tablet 1 08/18/2015 at 2000  . Multiple Vitamin (MULTIVITAMIN WITH MINERALS) TABS tablet Take 1 tablet by mouth daily. 30 tablet 0 Taking    Insulin Requirements in the past 24 hours:  40 units of Levemir 13 units of SSI  Assessment: Pt is s/p CABG on 5/5, pt begun complaining of abdominal pain and distention; KUB showed ileus with cecum measuring 15 cm. Surgery was consulted for a possible volvulus. Has a history of ileus that resolved after a couple days s/p prior surgery.  GI: ileus dilated cecum to 15 cm. 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 Had R hemicolectomy. Albumin low at 2.3. Plan to await return of bowel function. Last abdominal Xray showed no significant residual  distention. Overall looks good per CVTS and surgery. Clear liquid diet started. NG tube removed Endo: CBGs controlled (100-150s) on SSI + Levemir 20 units BID Lytes: wnl exc K at 3.7 (goal > 4) on KCL 70mEq daily. Phos borderline low at 2.2, Mg ok at 2.1 (goal > 2) CoCa 9.6 Renal: SCr stable, CrCl ~65ml/min. UOP stable at 0.20ml/kg/hr. D5NS @ 10 Pulm: Extubated on 5/11. Now on 4L of [place] Cards: 4 days out from CABG - VSS, intermittent AFib. Asa, Lasix, lopressor, statin Hepatobil: LFTs/Tbili wnl, TG 83 Neuro: hx EtOH abuse, taking beer BID while admitted to prevent withdrawal  ID: pre-op abx  Best Practices: Lovenox  TPN Access: PICC placed 5/8 TPN start date: 08/25/15  Nutritional Goals: per RD recs on 5/11 Kcal: 2100-2300 Protein: 115-125 g  Current Nutrition:  Clear liquid diet Clinimix E 5/15 at 78ml/hr IV lipid emulsions at 29ml/hr  Plan:  Increase Clinimix E 5/15 to 170ml/hr Continue 20% lipid emulsion at 54ml/hr D5NS at 10 ml/hr This provides 120 g of protein and 2184 kCals per day meeting 100% of protein and kcal needs Add MVI and TE in TPN Add thiamine and folic acid to TPN Continue resistant SSI and adjust as needed Continue Levemir 20 units BID per MD Monitor TPN labs, Cmet F/U return of bowel function  Give extra KCl 70mEq x 3 runs  Elenor Quinones, PharmD, [facility] Clinical Pharmacist Pager [phone] 08/28/2015 7:13 AM

## 2015-08-28 NOTE — Progress Notes (Signed)
Patient ID: Victor Diaz, male   DOB: 11/20/1945, 70 y.o.   MRN: [id number] EVENING ROUNDS NOTE :     [address]       [place] [address]             [phone]                 3 Days Post-Op Procedure(s) (LRB): EXPLORATORY LAPAROTOMY (N/A) ASCENDING AND TRANSVERSE COLECTOMY (N/A)  Total Length of Stay:  LOS: 9 days  BP 151/69 mmHg  Pulse 84  Temp(Src) 97.2 F (36.2 C) (Oral)  Resp 22  Ht 5\' 6"  (1.676 m)  Wt 100.8 kg (222 lb 3.6 oz)  BMI 35.88 kg/m2  SpO2 98%  .Intake/Output      05/13 0701 - 05/14 0700   P.O.    I.V. (mL/kg) 293.7 (2.9)   IV Piggyback 100   TPN 1023   Total Intake(mL/kg) 1416.7 (14.1)   Urine (mL/kg/hr) 1050 (0.9)   Total Output 1050   Net +366.7         . sodium chloride Stopped (08/25/15 1214)  . amiodarone 30 mg/hr (08/28/15 1800)  . dextrose 5 % and 0.9% NaCl 10 mL/hr at 08/28/15 1800  . Marland KitchenTPN (CLINIMIX-E) Adult 100 mL/hr at 08/28/15 1742   And  . fat emulsion 240 mL (08/28/15 1741)  . lactated ringers 10 mL/hr at 08/25/15 1000  . phenylephrine (NEO-SYNEPHRINE) Adult infusion Stopped (08/25/15 1700)     Lab Results  Component Value Date   WBC 12.4* 08/27/2015   HGB 8.6* 08/27/2015   HCT 26.1* 08/27/2015   PLT 228 08/27/2015   GLUCOSE 133* 08/28/2015   CHOL 160 11/19/2013   TRIG 83 08/25/2015   HDL 36.50* 11/19/2013   LDLDIRECT 85.3 12/06/2012   LDLCALC 88 11/19/2013   ALT 15* 08/27/2015   AST 22 08/27/2015   NA 137 08/28/2015   K 3.7 08/28/2015   CL 100* 08/28/2015   CREATININE 0.93 08/28/2015   BUN 22* 08/28/2015   CO2 27 08/28/2015   TSH 3.418 10/14/2014   PSA 0.47 11/03/2011   INR 1.29 08/25/2015   HGBA1C 5.6 08/19/2015   Stable, abdomen not tender, voiding  Grace Isaac MD  Beeper [phone] Office [phone] 08/28/2015 7:14 PM

## 2015-08-28 NOTE — Progress Notes (Addendum)
Patient ID: Victor Diaz, male   DOB: Aug 06, 1945, 69 y.o.   MRN: [id number] 3 Days Post-Op Procedure(s) (LRB): EXPLORATORY LAPAROTOMY (N/A) ASCENDING AND TRANSVERSE COLECTOMY (N/A) Subjective: Up in chair , alert and cooperative, walked around unit , no bm yet  Objective: Vital signs in last 24 hours: Temp:  [97.5 F (36.4 C)-98.9 F (37.2 C)] 98.4 F (36.9 C) (05/13 0305) Pulse Rate:  [84] 84 (05/13 0711) Cardiac Rhythm:  [-] Normal sinus rhythm (05/13 0600) Resp:  [12-25] 21 (05/13 0711) BP: (123-165)/(57-80) 135/59 mmHg (05/13 0700) SpO2:  [97 %-100 %] 100 % (05/13 0711) Arterial Line BP: (154)/(65) 154/65 mmHg (05/12 0900) Weight:  [222 lb 3.6 oz (100.8 kg)] 222 lb 3.6 oz (100.8 kg) (05/13 0600)  Hemodynamic parameters for last 24 hours:  stable  Intake/Output from previous day: 05/12 0701 - 05/13 0700 In: 17813.2 [P.O.:120; I.V.:15508.3; IV Piggyback:150; TPN:2034.9] Out: 1975 [id number] Intake/Output this shift:    Alert comfortable Sternal incision clean and dry Abdomen mildly distended but nontender Lungs clear Mild peripheral edema Neuro intact  Lab Results:  Recent Labs  08/26/15 0436 08/26/15 1701 08/27/15 0500  WBC 6.6  --  12.4*  HGB 8.7* 9.2* 8.6*  HCT 25.6* 27.0* 26.1*  PLT 153  --  228   BMET:   Recent Labs  08/26/15 0436 08/26/15 1701 08/27/15 0500  NA 133* 134* 136  K 4.0 4.2 3.8  CL 99* 96* 98*  CO2 27  --  27  GLUCOSE 191* 118* 142*  BUN 14 18 18   CREATININE 1.02 0.80 0.95  CALCIUM 7.9*  --  8.4*    PT/INR:   Recent Labs  08/25/15 0955  LABPROT 16.3*  INR 1.29   ABG    Component Value Date/Time   PHART 7.461* 08/27/2015 0605   HCO3 27.0* 08/27/2015 0605   TCO2 28.1 08/27/2015 0605   ACIDBASEDEF 1.0 08/25/2015 1503   O2SAT 98.8 08/27/2015 0605   CBG (last 3)   Recent Labs  08/27/15 2020 08/27/15 2329 08/28/15 0305  GLUCAP 127* 123* 127*    Assessment/Plan: S/P Procedure(s) (LRB): EXPLORATORY  LAPAROTOMY (N/A) ASCENDING AND TRANSVERSE COLECTOMY (N/A) Mobilize Diuresis Diabetes control Continue IV antibiotics for focal colonic necrosis Past history of heavy alcohol abuse and unsuccessful detoxification Postop cecal ileus requiring right colectomy may 10 with primary anastomosis Currently patient on TPN, NG tube is out, no flatus yet KUB shows mild small bowel gas pattern Abdomen is nontender Patient remains on IV cefotetan for necrosis of the colonic wall at time of surgery Patient has postop atrial fibrillation on IV amiodarone Patient had preop history of intermittent atrial fibrillation but was not a candidate for Coumadin because of his heavy alcohol use Patient had a stable night, no abdominal complaints or emesis Patient we mobilize out of bed to the chair We'll continue TPN until he resumes a adequate diet Continue IV amiodarone, IV beta blockers Daily IV Lasix to maintain balanced input and output Foley out last pm , voided once 400 ml, check bladder scan this am Walked twice around unit , mobility improving     LOS: 9 days    Grace Isaac 08/28/2015

## 2015-08-29 ENCOUNTER — Inpatient Hospital Stay (HOSPITAL_COMMUNITY): Payer: Medicare Other

## 2015-08-29 LAB — GLUCOSE, CAPILLARY
GLUCOSE-CAPILLARY: 120 mg/dL — AB (ref 65–99)
GLUCOSE-CAPILLARY: 131 mg/dL — AB (ref 65–99)
Glucose-Capillary: 125 mg/dL — ABNORMAL HIGH (ref 65–99)
Glucose-Capillary: 118 mg/dL — ABNORMAL HIGH (ref 65–99)
Glucose-Capillary: 116 mg/dL — ABNORMAL HIGH (ref 65–99)

## 2015-08-29 LAB — TYPE AND SCREEN
ABO/RH(D): A POS
Antibody Screen: NEGATIVE
Unit division: 0
Unit division: 0

## 2015-08-29 LAB — COMPREHENSIVE METABOLIC PANEL
ALT: 17 U/L (ref 17–63)
AST: 29 U/L (ref 15–41)
Albumin: 2.2 g/dL — ABNORMAL LOW (ref 3.5–5.0)
Alkaline Phosphatase: 39 U/L (ref 38–126)
Anion gap: 10 (ref 5–15)
BUN: 21 mg/dL — ABNORMAL HIGH (ref 6–20)
CO2: 26 mmol/L (ref 22–32)
Calcium: 8.2 mg/dL — ABNORMAL LOW (ref 8.9–10.3)
Chloride: 100 mmol/L — ABNORMAL LOW (ref 101–111)
Creatinine, Ser: 0.9 mg/dL (ref 0.61–1.24)
GFR calc Af Amer: >60 mL/min (ref >60)
GFR calc non Af Amer: >60 mL/min (ref >60)
Glucose, Bld: 136 mg/dL — ABNORMAL HIGH (ref 65–99)
Potassium: 4.2 mmol/L (ref 3.5–5.1)
Sodium: 136 mmol/L (ref 135–145)
Total Bilirubin: 0.9 mg/dL (ref 0.3–1.2)
Total Protein: 5.6 g/dL — ABNORMAL LOW (ref 6.5–8.1)

## 2015-08-29 LAB — CBC
HCT: 33.2 % — ABNORMAL LOW (ref 39.0–52.0)
Hemoglobin: 10.8 g/dL — ABNORMAL LOW (ref 13.0–17.0)
MCH: 31.9 pg (ref 26.0–34.0)
MCHC: 32.5 g/dL (ref 30.0–36.0)
MCV: 97.9 fL (ref 78.0–100.0)
Platelets: 157 10*3/uL (ref 150–400)
RBC: 3.39 MIL/uL — ABNORMAL LOW (ref 4.22–5.81)
RDW: 15.5 % (ref 11.5–15.5)
WBC: 12.7 10*3/uL — ABNORMAL HIGH (ref 4.0–10.5)

## 2015-08-29 MED ORDER — POTASSIUM CHLORIDE 10 MEQ/50ML IV SOLN
10.0000 meq | INTRAVENOUS | Status: AC
  Administered 2015-08-29 (×2): 10 meq via INTRAVENOUS
  Filled 2015-08-29: qty 50

## 2015-08-29 MED ORDER — TRACE MINERALS CR-CU-MN-SE-ZN 10-1000-500-60 MCG/ML IV SOLN
INTRAVENOUS | Status: AC
  Administered 2015-08-29: 18:00:00 via INTRAVENOUS
  Filled 2015-08-29: qty 2400

## 2015-08-29 MED ORDER — FAT EMULSION 20 % IV EMUL
240.0000 mL | INTRAVENOUS | Status: AC
  Administered 2015-08-29: 240 mL via INTRAVENOUS
  Filled 2015-08-29: qty 250

## 2015-08-29 NOTE — Progress Notes (Signed)
Patient ID: Victor Diaz, male   DOB: 1946-03-29, 70 y.o.   MRN: [id number]  TCTS DAILY ICU PROGRESS NOTE                   [address]            [place] [address]          [phone]   4 Days Post-Op Procedure(s) (LRB): EXPLORATORY LAPAROTOMY (N/A) ASCENDING AND TRANSVERSE COLECTOMY (N/A)  Total Length of Stay:  LOS: 10 days   Subjective: Does not feel as well today, nausea with ice chips , no vomiting, denies abdominal pain  Objective: Vital signs in last 24 hours: Temp:  [97.2 F (36.2 C)-98 F (36.7 C)] 98 F (36.7 C) (05/14 0428) Pulse Rate:  [85] 85 (05/14 0659) Cardiac Rhythm:  [-] Normal sinus rhythm (05/14 0729) Resp:  [12-25] 23 (05/14 0700) BP: (128-175)/(58-90) 175/67 mmHg (05/14 0700) SpO2:  [96 %-100 %] 100 % (05/14 0700) Weight:  [101.424 kg (223 lb 9.6 oz)] 101.424 kg (223 lb 9.6 oz) (05/14 0700)  Filed Weights   08/27/15 0600 08/28/15 0600 08/29/15 0700  Weight: 94.9 kg (209 lb 3.5 oz) 100.8 kg (222 lb 3.6 oz) 101.424 kg (223 lb 9.6 oz)    Weight change: 0.624 kg (1 lb 6 oz)   Hemodynamic parameters for last 24 hours:    Intake/Output from previous day: 05/13 0701 - 05/14 0700 In: 11627.1 [I.V.:9074.1; IV Piggyback:100; TPN:2453] Out: 2450 [Urine:2450]  Intake/Output this shift:    Current Meds: Scheduled Meds: . antiseptic oral rinse  7 mL Mouth Rinse BID  . bisacodyl  10 mg Oral Daily   Or  . bisacodyl  10 mg Rectal Daily  . bisacodyl  10 mg Rectal Once  . budesonide (PULMICORT) nebulizer solution  0.5 mg Nebulization BID  . docusate sodium  200 mg Oral Daily  . enoxaparin (LOVENOX) injection  40 mg Subcutaneous Q24H  . fentaNYL  75 mcg Transdermal Q72H  . furosemide  40 mg Intravenous Daily  . insulin aspart  0-20 Units Subcutaneous Q4H  . insulin detemir  20 Units Subcutaneous BID  . metoCLOPramide (REGLAN) injection  10 mg Intravenous Q6H  . pantoprazole (PROTONIX) IV  40 mg Intravenous Q24H  . potassium  chloride  20 mEq Oral Daily  . sodium chloride flush  10-40 mL Intracatheter Q12H  . sodium chloride flush  3 mL Intravenous Q12H   Continuous Infusions: . sodium chloride Stopped (08/25/15 1214)  . amiodarone 30 mg/hr (08/29/15 0700)  . dextrose 5 % and 0.9% NaCl 10 mL/hr at 08/29/15 0700  . Marland KitchenTPN (CLINIMIX-E) Adult 100 mL/hr at 08/29/15 0700   And  . fat emulsion 240 mL (08/29/15 0700)  . Marland KitchenTPN (CLINIMIX-E) Adult     And  . fat emulsion    . lactated ringers 10 mL/hr at 08/25/15 1000  . phenylephrine (NEO-SYNEPHRINE) Adult infusion Stopped (08/25/15 1700)   PRN Meds:.fentaNYL, haloperidol lactate, ipratropium-albuterol, LORazepam, metoprolol, ondansetron (ZOFRAN) IV, sodium chloride flush, sodium chloride flush  General appearance: alert, cooperative and mild distress Neurologic: intact Heart: regular rate and rhythm, S1, S2 normal, no murmur, click, rub or gallop Lungs: diminished breath sounds bibasilar Abdomen: abnormal findings:  absent bowel sounds, distended and no rebound tenderness Extremities: extremities normal, atraumatic, no cyanosis or edema and Homans sign is negative, no sign of DVT Wound: sternum stable, abdominal wound with dressing  GS to chesk  Lab Results: CBC: Recent Labs  08/27/15  0500 08/29/15 0247  WBC 12.4* 12.7*  HGB 8.6* 10.8*  HCT 26.1* 33.2*  PLT 228 157   BMET:  Recent Labs  08/28/15 0759 08/29/15 0247  NA 137 136  K 3.7 4.2  CL 100* 100*  CO2 27 26  GLUCOSE 133* 136*  BUN 22* 21*  CREATININE 0.93 0.90  CALCIUM 8.4* 8.2*    PT/INR: No results for input(s): LABPROT, INR in the last 72 hours. Radiology: No results found.   Assessment/Plan: S/P Procedure(s) (LRB): EXPLORATORY LAPAROTOMY (N/A) ASCENDING AND TRANSVERSE COLECTOMY (N/A) Mobilize Diabetes control Keep npo Check abdominal film Wbc stable follow Renal function stable    Grace Isaac 08/29/2015 8:29 AM

## 2015-08-29 NOTE — Progress Notes (Signed)
Patient ambulated X2 in hallway today, 200 ft then 225 ft on second walk, patient tolerated both walks well.  Rowe Pavy, RN

## 2015-08-29 NOTE — Progress Notes (Signed)
Patient ID: Victor Diaz, male   DOB: 05/27/1945, 70 y.o.   MRN: [id number] EVENING ROUNDS NOTE :     [address]       [place] [address]             [phone]                 4 Days Post-Op Procedure(s) (LRB): EXPLORATORY LAPAROTOMY (N/A) ASCENDING AND TRANSVERSE COLECTOMY (N/A)  Total Length of Stay:  LOS: 10 days  BP 123/75 mmHg  Pulse 85  Temp(Src) 97.3 F (36.3 C) (Oral)  Resp 22  Ht 5\' 6"  (1.676 m)  Wt 101.424 kg (223 lb 9.6 oz)  BMI 36.11 kg/m2  SpO2 100%  .Intake/Output      05/13 0701 - 05/14 0700 05/14 0701 - 05/15 0700   P.O.     I.V. (mL/kg) 9074.1 (89.5) 267 (2.6)   IV Piggyback 100 100   TPN 2453 1100   Total Intake(mL/kg) 11627.1 (114.6) 1467 (14.5)   Urine (mL/kg/hr) 2450 (1) 1350 (1.1)   Emesis/NG output  700 (0.6)   Total Output 2450 2050   Net +9177.1 -583          . sodium chloride Stopped (08/25/15 1214)  . amiodarone 30 mg/hr (08/29/15 1000)  . dextrose 5 % and 0.9% NaCl 10 mL/hr at 08/29/15 1000  . Marland KitchenTPN (CLINIMIX-E) Adult 100 mL/hr at 08/29/15 1754   And  . fat emulsion 240 mL (08/29/15 1753)  . lactated ringers 10 mL/hr at 08/25/15 1000  . phenylephrine (NEO-SYNEPHRINE) Adult infusion Stopped (08/25/15 1700)     Lab Results  Component Value Date   WBC 12.7* 08/29/2015   HGB 10.8* 08/29/2015   HCT 33.2* 08/29/2015   PLT 157 08/29/2015   GLUCOSE 136* 08/29/2015   CHOL 160 11/19/2013   TRIG 83 08/25/2015   HDL 36.50* 11/19/2013   LDLDIRECT 85.3 12/06/2012   LDLCALC 88 11/19/2013   ALT 17 08/29/2015   AST 29 08/29/2015   NA 136 08/29/2015   K 4.2 08/29/2015   CL 100* 08/29/2015   CREATININE 0.90 08/29/2015   BUN 21* 08/29/2015   CO2 26 08/29/2015   TSH 3.418 10/14/2014   PSA 0.47 11/03/2011   INR 1.29 08/25/2015   HGBA1C 5.6 08/19/2015  Dg Chest Port 1 View  08/29/2015  CLINICAL DATA:  Status post CABG EXAM: PORTABLE CHEST 1 VIEW COMPARISON:  08/28/2015 FINDINGS: Cardiomegaly again noted. Status post  CABG. Left atrial appendage clip is unchanged in position. No pulmonary edema. No segmental infiltrate. Bilateral mild basilar atelectasis. There is no pneumothorax. Stable right arm PICC line position. IMPRESSION: Status post CABG. Mild basilar atelectasis. No infiltrate or pulmonary edema. Right arm PICC line is unchanged in position. Electronically Signed   By: Lahoma Crocker M.D.   On: 08/29/2015 09:28   Dg Abd Portable 1v  08/29/2015  CLINICAL DATA:  Status post colectomy postop day 4 EXAM: PORTABLE ABDOMEN - 1 VIEW COMPARISON:  08/27/2015 FINDINGS: Mild gaseous distended small bowel loops in mid abdomen probable postop ileus. Midline skin staples again noted. Some colonic gas and stool noted in proximal left colon. IMPRESSION: Mild gaseous distended small bowel loops mid abdomen probable postop ileus. Electronically Signed   By: Lahoma Crocker M.D.   On: 08/29/2015 13:04   Increased nausea and some vomiting, ng tube replaced with 700 ml of  bilious material, more comfortable now    Grace Isaac MD  [facility]  [id number] 08/29/2015 6:52 PM

## 2015-08-29 NOTE — Progress Notes (Signed)
4 Days Post-Op  Subjective: POD 4 s/p exlap and R hemicolectomy with ileo-transverse anastomosis.  NAE overnight/ States this morning that he feels "crumby" without specific complaint. Denies any passage of flatus or BM.  Some nausea noted with ice chips and water. Ambulated yesterday.    Objective: Vital signs in last 24 hours: Temp:  [97.2 F (36.2 C)-98 F (36.7 C)] 98 F (36.7 C) (05/14 0428) Pulse Rate:  [85] 85 (05/14 0659) Resp:  [12-25] 23 (05/14 0700) BP: (128-175)/(58-90) 175/67 mmHg (05/14 0700) SpO2:  [96 %-100 %] 100 % (05/14 0700) Weight:  [101.424 kg (223 lb 9.6 oz)] 101.424 kg (223 lb 9.6 oz) (05/14 0700) Last BM Date: 08/25/15  Intake/Output from previous day: 05/13 0701 - 05/14 0700 In: 11627.1 [I.V.:9074.1; IV Piggyback:100; TPN:2453] Out: 2450 [Urine:2450] Intake/Output this shift:    General appearance: alert, cooperative, mild distress and moderately obese Resp: Non labored respirations.  Bilateral chest wall expansion. GI: Soft.  Distended.  Tympanitic.  No rebound.  Appropriately tender to palpation adjacent to incision.  Incision/Wound:  Dressing removed.  Clean dry and intact.  The wound appears healthy without evidence of infection.  Some dusky appearance of skin at site of umbilicus, but no frankly dead tissue.  Lab Results:   Recent Labs  08/27/15 0500 08/29/15 0247  WBC 12.4* 12.7*  HGB 8.6* 10.8*  HCT 26.1* 33.2*  PLT 228 157   BMET  Recent Labs  08/28/15 0759 08/29/15 0247  NA 137 136  K 3.7 4.2  CL 100* 100*  CO2 27 26  GLUCOSE 133* 136*  BUN 22* 21*  CREATININE 0.93 0.90  CALCIUM 8.4* 8.2*   PT/INR No results for input(s): LABPROT, INR in the last 72 hours. ABG  Recent Labs  08/26/15 1249 08/27/15 0605  PHART 7.469* 7.461*  HCO3 29.5* 27.0*    Studies/Results: Dg Chest Port 1 View  08/28/2015  CLINICAL DATA:  Short of breath with exertion EXAM: PORTABLE CHEST 1 VIEW COMPARISON:  Aug 27, 2015 FINDINGS: Stable  cardiomegaly. The right PICC line is stable. No pneumothorax. No pulmonary nodules, masses, or focal infiltrates. No significant interval change. IMPRESSION: No significant interval change. Electronically Signed   By: Dorise Bullion III M.D   On: 08/28/2015 07:12    Anti-infectives: Anti-infectives    Start     Dose/Rate Route Frequency Ordered Stop   08/26/15 1000  cefoTEtan (CEFOTAN) 1 g in dextrose 5 % 50 mL IVPB     1 g 100 mL/hr over 30 Minutes Intravenous  Once 08/26/15 0853 08/26/15 1057   08/25/15 1115  cefoTEtan in Dextrose 5% (CEFOTAN) IVPB 2 g     2 g Intravenous  Once 08/25/15 1111 08/25/15 1150   08/25/15 1113  cefoTEtan in Dextrose 5% (CEFOTAN) 2-2.08 GM-% IVPB    Comments:  [name]   : cabinet override      08/25/15 1113 08/25/15 2329   08/21/15 0400  cefUROXime (ZINACEF) 1.5 g in dextrose 5 % 50 mL IVPB     1.5 g 100 mL/hr over 30 Minutes Intravenous Every 12 hours 08/20/15 1742 08/22/15 1630   08/20/15 2330  vancomycin (VANCOCIN) IVPB 1000 mg/200 mL premix     1,000 mg 200 mL/hr over 60 Minutes Intravenous Every 12 hours 08/20/15 1827 08/21/15 2330   08/20/15 2315  vancomycin (VANCOCIN) IVPB 1000 mg/200 mL premix  Status:  Discontinued     1,000 mg 200 mL/hr over 60 Minutes Intravenous  Once 08/20/15 1742 08/20/15 1827  08/20/15 0400  vancomycin (VANCOCIN) 1,500 mg in sodium chloride 0.9 % 250 mL IVPB  Status:  Discontinued     1,500 mg 125 mL/hr over 120 Minutes Intravenous To Surgery 08/19/15 1605 08/20/15 1523   08/20/15 0400  cefUROXime (ZINACEF) 1.5 g in dextrose 5 % 50 mL IVPB  Status:  Discontinued     1.5 g 100 mL/hr over 30 Minutes Intravenous To Surgery 08/19/15 1605 08/20/15 1523   08/20/15 0400  cefUROXime (ZINACEF) 750 mg in dextrose 5 % 50 mL IVPB  Status:  Discontinued     750 mg 100 mL/hr over 30 Minutes Intravenous To Surgery 08/19/15 1605 08/20/15 1523      Assessment/Plan: s/p Procedure(s): EXPLORATORY LAPAROTOMY (N/A) ASCENDING AND  TRANSVERSE COLECTOMY (N/A) POD 4 s/p exlap and R hemicolectomy for cecal volulus  -Still awaiting return of bowel function.  Would make NPO as he is nauseated and distended while on ice chips and sips of water.  If he has emesis, would place NG tube to help with decompression.  If no bowel function by tomorrow, will obtain AAS to evaluate bowel for persistent ileus.   -Continue with TNA for PCM. -Continue to encourage ambulation. -WBC stable this morning, will continue to monitor.    LOS: 10 days    Reino Kent 08/29/2015

## 2015-08-29 NOTE — Progress Notes (Signed)
PARENTERAL NUTRITION CONSULT NOTE - INITIAL  Pharmacy Consult for TPN Indication: ileus  Allergies  Allergen Reactions  . Atorvastatin Anaphylaxis    LIPITOR -  facial swelling    Patient Measurements: Height: 5\' 6"  (167.6 cm) Weight: 223 lb 9.6 oz (101.424 kg) IBW/kg (Calculated) : 63.8 Adjusted Body Weight: 76 kg Usual Weight: 94.4 kg  Vital Signs: Temp: 98 F (36.7 C) (05/14 0428) Temp Source: Oral (05/14 0428) BP: 175/67 mmHg (05/14 0700) Pulse Rate: 85 (05/14 0659) Intake/Output from previous day: 05/13 0701 - 05/14 0700 In: 11627.1 [I.V.:9074.1; IV Piggyback:100; TPN:2453] Out: 2450 [Urine:2450] Intake/Output from this shift:    Labs:  Recent Labs  08/26/15 1701 08/27/15 0500 08/29/15 0247  WBC  --  12.4* 12.7*  HGB 9.2* 8.6* 10.8*  HCT 27.0* 26.1* 33.2*  PLT  --  228 157     Recent Labs  08/27/15 0500 08/28/15 0759 08/29/15 0247  NA 136 137 136  K 3.8 3.7 4.2  CL 98* 100* 100*  CO2 27 27 26   GLUCOSE 142* 133* 136*  BUN 18 22* 21*  CREATININE 0.95 0.93 0.90  CALCIUM 8.4* 8.4* 8.2*  PROT 5.5*  --  5.6*  ALBUMIN 2.3*  --  2.2*  AST 22  --  29  ALT 15*  --  17  ALKPHOS 32*  --  39  BILITOT 0.8  --  0.9   Estimated Creatinine Clearance: 83.9 mL/min (by C-G formula based on Cr of 0.9).    Recent Labs  08/28/15 2021 08/28/15 2351 08/29/15 0425  GLUCAP 125* 126* 125*    Medical History: Past Medical History  Diagnosis Date  . Allergy   . Gout   . Hyperlipidemia   . Alcohol abuse   . Hemothorax on right   . Lisfranc's dislocation 10/20/2011  . Right fibular fracture 10/20/2011  . Essential hypertension   . PSVT (paroxysmal supraventricular tachycardia) ([place])   . Paroxysmal atrial flutter ([facility])     Poor anticoagulation candidate with active alcohol abuse and syncope  . Subclavian artery stenosis, left     Status post stent placement by Dr. Trula Slade 10/2014    Medications:  Prescriptions prior to admission  Medication Sig Dispense  Refill Last Dose  . aspirin 325 MG tablet Take 325 mg by mouth daily.    08/19/2015 at 0530  . cetirizine (ZYRTEC) 10 MG tablet TAKE ONE TABLET BY MOUTH ONCE DAILY AS NEEDED. (Patient taking differently: Take 10 mg by mouth daily as needed for allergies. TAKE ONE TABLET BY MOUTH ONCE DAILY AS NEEDED.) 60 tablet 0 08/18/2015 at Unknown time  . metoprolol succinate (TOPROL-XL) 25 MG 24 hr tablet **MUST SCHEDULE APPT FOR FURTHER REFILLS.** (Patient taking differently: Take 25 mg by mouth daily. **MUST SCHEDULE APPT FOR FURTHER REFILLS.**) 180 tablet 0 08/18/2015 at 1600  . nitroGLYCERIN (NITROSTAT) 0.4 MG SL tablet Place 1 tablet (0.4 mg total) under the tongue every 5 (five) minutes as needed. 25 tablet 3 08/19/2015 at 0400  . omeprazole (PRILOSEC) 20 MG capsule TAKE 1 CAPSULE BY MOUTH DAILY AS NEEDED FOR HEART BURN. 90 capsule 1 08/18/2015 at 2000  . simvastatin (ZOCOR) 40 MG tablet TAKE (1) TABLET BY MOUTH AT BEDTIME. 90 tablet 1 08/18/2015 at 2000  . Multiple Vitamin (MULTIVITAMIN WITH MINERALS) TABS tablet Take 1 tablet by mouth daily. 30 tablet 0 Taking    Insulin Requirements in the past 24 hours:  40 units of Levemir 18 units of SSI  Assessment: Pt is s/p  CABG on 5/5, pt begun complaining of abdominal pain and distention; KUB showed ileus with cecum measuring 15 cm. Surgery was consulted for a possible volvulus. Has a history of ileus that resolved after a couple days s/p prior surgery.  GI: ileus dilated cecum to 15 cm. 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 Had R hemicolectomy. Albumin low at 2.3. Plan to await return of bowel function. Last abdominal Xray showed no significant residual distention. NG tube removed. Clear liquid diet started on 5/13 after improving but feels "crumby" this am. Also reports some nausea with ice chips and water. Transitioned back to NPO, may need to place NG tube back if he vomits Endo: CBGs controlled (120s) on SSI + Levemir 20 units BID Lytes: wnl. K at 4.2 (goal > 4) on  KCL 72mEq daily. Phos borderline low at 2.2, Mg ok at 2.1 (goal > 2) CoCa 9.5 Renal: SCr stable, CrCl ~42ml/min. UOP stable at 0.17ml/kg/hr. D5NS @ 10 Pulm: Extubated on 5/11. Now on RA Cards: 4 days out from CABG - VSS, intermittent AFib. Asa, Lasix, lopressor, statin Hepatobil: LFTs/Tbili wnl, TG 83 Neuro: hx EtOH abuse, taking beer BID while admitted to prevent withdrawal  ID: pre-op [email or web address]  Best Practices: Lovenox  TPN Access: PICC placed 5/8 TPN start date: 08/25/15  Nutritional Goals: per RD recs on 5/11 Kcal: 2100-2300 Protein: 115-125 g  Current Nutrition:  NPO Clinimix E 5/15 at 149ml/hr IV lipid emulsions at 17ml/hr  Plan:  Continue Clinimix E 5/15 to 1100ml/hr Continue 20% lipid emulsion at 20ml/hr D5NS at 10 ml/hr This provides 120 g of protein and 2184 kCals per day meeting 100% of protein and kcal needs Add MVI and TE in TPN Add thiamine and folic acid to TPN Continue resistant SSI and adjust as needed Continue Levemir 20 units BID per MD Monitor TPN labs F/U return of bowel function, advancement of diet  Elenor Quinones, PharmD, BCPS Clinical Pharmacist Pager [phone] 08/29/2015 8:15 AM

## 2015-08-29 NOTE — Progress Notes (Signed)
Patient has been having nausea all day, zofran given times 3, abdominal xray ordered, notified Dr. Hulen Skains of nausea and vomiting X1, Dr. Hulen Skains wrote orders to place NG after reading xray.   Rowe Pavy, RN

## 2015-08-30 ENCOUNTER — Inpatient Hospital Stay (HOSPITAL_COMMUNITY): Payer: Medicare Other

## 2015-08-30 LAB — CBC
HCT: 29.2 % — ABNORMAL LOW (ref 39.0–52.0)
HEMOGLOBIN: 9.4 g/dL — AB (ref 13.0–17.0)
MCH: 31.2 pg (ref 26.0–34.0)
MCHC: 32.2 g/dL (ref 30.0–36.0)
MCV: 97 fL (ref 78.0–100.0)
PLATELETS: 131 10*3/uL — AB (ref 150–400)
RBC: 3.01 MIL/uL — AB (ref 4.22–5.81)
RDW: 15.4 % (ref 11.5–15.5)
WBC: 13.7 10*3/uL — ABNORMAL HIGH (ref 4.0–10.5)

## 2015-08-30 LAB — COMPREHENSIVE METABOLIC PANEL
ALT: 13 U/L — ABNORMAL LOW (ref 17–63)
AST: 27 U/L (ref 15–41)
Albumin: 2.3 g/dL — ABNORMAL LOW (ref 3.5–5.0)
Alkaline Phosphatase: 34 U/L — ABNORMAL LOW (ref 38–126)
Anion gap: 7 (ref 5–15)
BILIRUBIN TOTAL: 0.8 mg/dL (ref 0.3–1.2)
BUN: 22 mg/dL — AB (ref 6–20)
CHLORIDE: 100 mmol/L — AB (ref 101–111)
CO2: 26 mmol/L (ref 22–32)
CREATININE: 0.95 mg/dL (ref 0.61–1.24)
Calcium: 8.1 mg/dL — ABNORMAL LOW (ref 8.9–10.3)
Glucose, Bld: 123 mg/dL — ABNORMAL HIGH (ref 65–99)
POTASSIUM: 4.2 mmol/L (ref 3.5–5.1)
Sodium: 133 mmol/L — ABNORMAL LOW (ref 135–145)
TOTAL PROTEIN: 5.1 g/dL — AB (ref 6.5–8.1)

## 2015-08-30 LAB — GLUCOSE, CAPILLARY
GLUCOSE-CAPILLARY: 112 mg/dL — AB (ref 65–99)
GLUCOSE-CAPILLARY: 116 mg/dL — AB (ref 65–99)
GLUCOSE-CAPILLARY: 125 mg/dL — AB (ref 65–99)
Glucose-Capillary: 116 mg/dL — ABNORMAL HIGH (ref 65–99)
Glucose-Capillary: 114 mg/dL — ABNORMAL HIGH (ref 65–99)
Glucose-Capillary: 108 mg/dL — ABNORMAL HIGH (ref 65–99)

## 2015-08-30 LAB — DIFFERENTIAL
BASOS ABS: 0 10*3/uL (ref 0.0–0.1)
Basophils Relative: 0 %
EOS ABS: 0.3 10*3/uL (ref 0.0–0.7)
Eosinophils Relative: 2 %
LYMPHS ABS: 1.1 10*3/uL (ref 0.7–4.0)
Lymphocytes Relative: 8 %
Monocytes Absolute: 1.1 10*3/uL — ABNORMAL HIGH (ref 0.1–1.0)
Monocytes Relative: 8 %
NEUTROS ABS: 11.2 10*3/uL — AB (ref 1.7–7.7)
NEUTROS PCT: 82 %

## 2015-08-30 LAB — MAGNESIUM: MAGNESIUM: 2.2 mg/dL (ref 1.7–2.4)

## 2015-08-30 LAB — TRIGLYCERIDES: TRIGLYCERIDES: 77 mg/dL (ref <150)

## 2015-08-30 LAB — PHOSPHORUS: PHOSPHORUS: 4 mg/dL (ref 2.5–4.6)

## 2015-08-30 LAB — PREALBUMIN: PREALBUMIN: 11.2 mg/dL — AB (ref 18–38)

## 2015-08-30 MED ORDER — INSULIN ASPART 100 UNIT/ML ~~LOC~~ SOLN
0.0000 [IU] | Freq: Four times a day (QID) | SUBCUTANEOUS | Status: DC
  Administered 2015-08-31 – 2015-09-02 (×5): 3 [IU] via SUBCUTANEOUS

## 2015-08-30 MED ORDER — M.V.I. ADULT IV INJ
INJECTION | INTRAVENOUS | Status: AC
  Administered 2015-08-30: 18:00:00 via INTRAVENOUS
  Filled 2015-08-30: qty 2400

## 2015-08-30 MED ORDER — ALBUMIN HUMAN 25 % IV SOLN
25.0000 g | Freq: Four times a day (QID) | INTRAVENOUS | Status: AC
  Administered 2015-08-30 – 2015-08-31 (×3): 25 g via INTRAVENOUS
  Filled 2015-08-30: qty 100
  Filled 2015-08-30 (×2): qty 50

## 2015-08-30 MED ORDER — DOCUSATE SODIUM 50 MG/5ML PO LIQD
200.0000 mg | Freq: Every day | ORAL | Status: DC
  Administered 2015-08-30 – 2015-09-06 (×5): 200 mg via ORAL
  Filled 2015-08-30 (×5): qty 20

## 2015-08-30 MED ORDER — FAT EMULSION 20 % IV EMUL
240.0000 mL | INTRAVENOUS | Status: AC
  Administered 2015-08-30: 240 mL via INTRAVENOUS
  Filled 2015-08-30: qty 250

## 2015-08-30 MED ORDER — ALBUMIN HUMAN 25 % IV SOLN: 25.0000 g | Freq: Four times a day (QID) | INTRAVENOUS | Status: DC

## 2015-08-30 NOTE — Progress Notes (Signed)
Physical Therapy Treatment Patient Details Name: LAWRENCE JEUNG MRN: [id number] DOB: [birth year] Today's Date: 08/30/2015    History of Present Illness pt presents with CABG x4 on 5/6; Rt hemicolectomy 5/10.  pt with hx of heavy Etoh use, L THA, Gout, HTN, and A-Flutter.      PT Comments    Pt wanting to return to bed, but agreeable to ambulate prior to returning to bed.  Pt tolerated increased distance, but clearly with increased WOB, though sats remained stable.  Continue to feel pt would be appropriate for CIR level of therapies at D/C.  Will continue to follow.    Follow Up Recommendations  CIR     Equipment Recommendations  None recommended by PT    Recommendations for Other Services       Precautions / Restrictions Precautions Precautions: Fall;Sternal Restrictions Weight Bearing Restrictions: No    Mobility  Bed Mobility Overal bed mobility: Needs Assistance Bed Mobility: Sit to Sidelying         Sit to sidelying: Mod assist General bed mobility comments: A with Bil LEs with return to bed.    Transfers Overall transfer level: Needs assistance Equipment used: Pushed w/c Transfers: Sit to/from Stand Sit to Stand: Min assist         General transfer comment: vc for sternal precautions; steady assist  Ambulation/Gait Ambulation/Gait assistance: Min guard Ambulation Distance (Feet): 225 Feet Assistive device:  (pushed W/C) Gait Pattern/deviations: Step-through pattern;Decreased stride length     General Gait Details: pt needs cues for pacing and slowing breathing.  pt also needed encouragement to increase ambulation distance.   Stairs            Wheelchair Mobility    Modified Rankin (Stroke Patients Only)       Balance Overall balance assessment: Needs assistance Sitting-balance support: No upper extremity supported;Feet supported Sitting balance-Leahy Scale: Fair     Standing balance support: Bilateral upper extremity  supported;During functional activity Standing balance-Leahy Scale: Poor                      Cognition Arousal/Alertness: Awake/alert Behavior During Therapy: WFL for tasks assessed/performed Overall Cognitive Status: Within Functional Limits for tasks assessed                      Exercises      General Comments        Pertinent Vitals/Pain Pain Assessment: No/denies pain    Home Living                      Prior Function            PT Goals (current goals can now be found in the care plan section) Acute Rehab PT Goals Patient Stated Goal: Walk PT Goal Formulation: With patient Time For Goal Achievement: 09/05/15 Potential to Achieve Goals: Good Progress towards PT goals: Progressing toward goals    Frequency  Min 3X/week    PT Plan Current plan remains appropriate    Co-evaluation             End of Session Equipment Utilized During Treatment: Gait belt Activity Tolerance: Patient tolerated treatment well Patient left: in bed;with call bell/phone within reach     Time: [id number] PT Time Calculation (min) (ACUTE ONLY): 18 min  Charges:  $Gait Training: 8-22 mins  G CodesCatarina Hartshorn, [place] 08/30/2015, 10:56 AM

## 2015-08-30 NOTE — Progress Notes (Addendum)
PARENTERAL NUTRITION CONSULT NOTE - INITIAL  Pharmacy Consult for TPN Indication: ileus  Allergies  Allergen Reactions  . Atorvastatin Anaphylaxis    LIPITOR -  facial swelling    Patient Measurements: Height: 5\' 6"  (167.6 cm) Weight: 222 lb 1.6 oz (100.744 kg) IBW/kg (Calculated) : 63.8 Adjusted Body Weight: 76 kg Usual Weight: 94.4 kg  Vital Signs: Temp: 97.5 F (36.4 C) (05/15 0354) Temp Source: Oral (05/15 0354) BP: 139/70 mmHg (05/15 0800) Intake/Output from previous day: 05/14 0701 - 05/15 0700 In: 3439.1 [I.V.:640.8; NG/GT:60; IV Piggyback:100; TPN:2638.3] Out: 3101 [Urine:2150; Emesis/NG output:950; Stool:1] Intake/Output from this shift: Total I/O In: 166.7 [I.V.:26.7; NG/GT:30; TPN:110] Out: 0   Labs:  Recent Labs  08/29/15 0247 08/30/15 0412  WBC 12.7* 13.7*  HGB 10.8* 9.4*  HCT 33.2* 29.2*  PLT 157 131*     Recent Labs  08/28/15 0759 08/29/15 0247 08/30/15 0412  NA 137 136 133*  K 3.7 4.2 4.2  CL 100* 100* 100*  CO2 27 26 26   GLUCOSE 133* 136* 123*  BUN 22* 21* 22*  CREATININE 0.93 0.90 0.95  CALCIUM 8.4* 8.2* 8.1*  MG  --   --  2.2  PHOS  --   --  4.0  PROT  --  5.6* 5.1*  ALBUMIN  --  2.2* 2.3*  AST  --  29 27  ALT  --  17 13*  ALKPHOS  --  39 34*  BILITOT  --  0.9 0.8  PREALBUMIN  --   --  11.2*  TRIG  --   --  77   Estimated Creatinine Clearance: 79.3 mL/min (by C-G formula based on Cr of 0.95).    Recent Labs  08/29/15 1921 08/30/15 08/30/15 0352  GLUCAP 116* 116* 116*    Medical History: Past Medical History  Diagnosis Date  . Allergy   . Gout   . Hyperlipidemia   . Alcohol abuse   . Hemothorax on right   . Lisfranc's dislocation 10/20/2011  . Right fibular fracture 10/20/2011  . Essential hypertension   . PSVT (paroxysmal supraventricular tachycardia) ([place])   . Paroxysmal atrial flutter ([facility])     Poor anticoagulation candidate with active alcohol abuse and syncope  . Subclavian artery stenosis, left    Status post stent placement by Dr. Trula Slade 10/2014    Medications:  Prescriptions prior to admission  Medication Sig Dispense Refill Last Dose  . aspirin 325 MG tablet Take 325 mg by mouth daily.    08/19/2015 at 0530  . cetirizine (ZYRTEC) 10 MG tablet TAKE ONE TABLET BY MOUTH ONCE DAILY AS NEEDED. (Patient taking differently: Take 10 mg by mouth daily as needed for allergies. TAKE ONE TABLET BY MOUTH ONCE DAILY AS NEEDED.) 60 tablet 0 08/18/2015 at Unknown time  . metoprolol succinate (TOPROL-XL) 25 MG 24 hr tablet **MUST SCHEDULE APPT FOR FURTHER REFILLS.** (Patient taking differently: Take 25 mg by mouth daily. **MUST SCHEDULE APPT FOR FURTHER REFILLS.**) 180 tablet 0 08/18/2015 at 1600  . nitroGLYCERIN (NITROSTAT) 0.4 MG SL tablet Place 1 tablet (0.4 mg total) under the tongue every 5 (five) minutes as needed. 25 tablet 3 08/19/2015 at 0400  . omeprazole (PRILOSEC) 20 MG capsule TAKE 1 CAPSULE BY MOUTH DAILY AS NEEDED FOR HEART BURN. 90 capsule 1 08/18/2015 at 2000  . simvastatin (ZOCOR) 40 MG tablet TAKE (1) TABLET BY MOUTH AT BEDTIME. 90 tablet 1 08/18/2015 at 2000  . Multiple Vitamin (MULTIVITAMIN WITH MINERALS) TABS tablet Take 1 tablet by  mouth daily. 30 tablet 0 Taking    Insulin Requirements in the past 24 hours:  40 units of Levemir 0 units of SSI  Assessment: Pt is s/p CABG on 5/5, pt begun complaining of abdominal pain and distention; KUB showed ileus with cecum measuring 15 cm. Surgery was consulted for a possible volvulus. Has a history of ileus that resolved after a couple days s/p prior surgery.  GI: ileus dilated cecum to 15 cm. 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 Had R hemicolectomy. Albumin low at 2.3. Prealbumin up to 11.2. Plan to await return of bowel function. Last abdominal Xray showed worsening of SBO. Felt bad on 5/14. NG tube had to be replaced yesterday as he was found to have an ileus as well. Had 790ml of bilious fluid come out when NG tube placed.  Endo: CBGs controlled  (110-120s) on SSI + Levemir 20 units BID Lytes: wnl. K at 4.2 (goal > 4) on KCL 80mEq daily. Phos borderline low at 2.2, Mg ok at 2.2 (goal > 2) Phos ok at 4.0. CoCa 9.3 Renal: SCr stable, CrCl ~57ml/min. UOP stable at 1.41ml/kg/hr. D5NS @ 10 Pulm: Extubated on 5/11. Now on RA Cards: 4 days out from CABG - VSS, intermittent AFib. Asa, Lasix, lopressor, statin Hepatobil: LFTs/Tbili wnl, TG wnl Neuro: hx EtOH abuse, taking beer BID while admitted to prevent withdrawal  ID: pre-op [id number]  Best Practices: Lovenox  TPN Access: PICC placed 5/8 TPN start date: 08/25/15  Nutritional Goals: per RD recs on 5/11 Kcal: 2100-2300 Protein: 115-125 g  Current Nutrition:  NPO Clinimix E 5/15 at 132ml/hr IV lipid emulsions at 18ml/hr  Plan:  Continue Clinimix E 5/15 at 196ml/hr Continue 20% lipid emulsion at 8ml/hr D5NS at 10 ml/hr This provides 120 g of protein and 2184 kCals per day meeting 100% of protein and kcal needs Add MVI and TE in TPN Add thiamine and folic acid to TPN Change resistant SSI to Q6 and adjust as needed Continue Levemir 20 units BID per MD Monitor TPN labs F/U return of bowel function, advancement of diet  Elenor Quinones, PharmD, [facility] Clinical Pharmacist Pager [phone] 08/30/2015 8:35 AM

## 2015-08-30 NOTE — Progress Notes (Signed)
Rehab admissions - I met with patient and his son at the bedside.  I gave them booklets about inpatient rehab.  They live in [place].  Patient doing well with ambulation walking 225 feet minguard assist.  Currently on TNA and has an NG tube.  Not medically ready for inpatient rehab admission at this time.  Will follow progress.  Call me for questions.  #[phone]

## 2015-08-30 NOTE — Progress Notes (Signed)
5 Days Post-Op Procedure(s) (LRB): EXPLORATORY LAPAROTOMY (N/A) ASCENDING AND TRANSVERSE COLECTOMY (N/A) Subjective: Slow recovery after emergency right hemicolectomy for cecal volvulus Patient with postop ileus with NG tube Patient had rectal smear this a.m. Abdomen remains dilated but not tender Patient continues on TPN, he is ambulatory Patient maintained sinus rhythm on IV amiodarone  Objective: Vital signs in last 24 hours: Temp:  [97.3 F (36.3 C)-98.6 F (37 C)] 98.6 F (37 C) (05/15 1200) Cardiac Rhythm:  [-] Atrial fibrillation (05/15 1200) Resp:  [12-23] 21 (05/15 1300) BP: (111-155)/(59-108) 145/70 mmHg (05/15 1300) SpO2:  [98 %-100 %] 100 % (05/15 1300) Weight:  [222 lb 1.6 oz (100.744 kg)] 222 lb 1.6 oz (100.744 kg) (05/15 0600)  Hemodynamic parameters for last 24 hours:   Stable Intake/Output from previous day: 05/14 0701 - 05/15 0700 In: 3439.1 [I.V.:640.8; NG/GT:60; IV Piggyback:100; TPN:2638.3] Out: 3101 [Urine:2150; Emesis/NG output:950; Stool:1] Intake/Output this shift: Total I/O In: 910.2 [I.V.:160.2; NG/GT:90; TPN:660] Out: 1100 [Urine:1100]  Neuro intact, no DVTs Abdomen distended nontender Lungs clear Sternal incision is clean.  Good fit great great results. Lab Results:  Recent Labs  08/29/15 0247 08/30/15 0412  WBC 12.7* 13.7*  HGB 10.8* 9.4*  HCT 33.2* 29.2*  PLT 157 131*   BMET:  Recent Labs  08/29/15 0247 08/30/15 0412  NA 136 133*  K 4.2 4.2  CL 100* 100*  CO2 26 26  GLUCOSE 136* 123*  BUN 21* 22*  CREATININE 0.90 0.95  CALCIUM 8.2* 8.1*    PT/INR: No results for input(s): LABPROT, INR in the last 72 hours. ABG    Component Value Date/Time   PHART 7.461* 08/27/2015 0605   HCO3 27.0* 08/27/2015 0605   TCO2 28.1 08/27/2015 0605   ACIDBASEDEF 1.0 08/25/2015 1503   O2SAT 98.8 08/27/2015 0605   CBG (last 3)   Recent Labs  08/30/15 0352 08/30/15 0900 08/30/15 1233  GLUCAP 116* 114* 125*     Assessment/Plan: S/P Procedure(s) (LRB): EXPLORATORY LAPAROTOMY (N/A) ASCENDING AND TRANSVERSE COLECTOMY (N/A) Continue TPN Continue IV amiodarone  ambulate daily   LOS: 11 days    Victor Diaz 08/30/2015

## 2015-08-30 NOTE — Progress Notes (Signed)
5 Days Post-Op  Subjective: POD 5 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 Ng placed yesterday as he was found to have ileus on imaging and was nauseated with episode of emesis.  700cc of bilious fluid on placement of NG.  Objective: Vital signs in last 24 hours: Temp:  [97.2 F (36.2 C)-97.7 F (36.5 C)] 97.5 F (36.4 C) (05/15 0354) Resp:  [12-25] 17 (05/15 0800) BP: (111-155)/(56-108) 139/70 mmHg (05/15 0800) SpO2:  [98 %-100 %] 100 % (05/15 0800) Weight:  [100.744 kg (222 lb 1.6 oz)] 100.744 kg (222 lb 1.6 oz) (05/15 0600) Last BM Date: 08/25/15  Intake/Output from previous day: 05/14 0701 - 05/15 0700 In: 3439.1 [I.V.:640.8; NG/GT:60; IV Piggyback:100; TPN:2638.3] Out: 3101 [Urine:2150; Emesis/NG output:950; Stool:1] Intake/Output this shift: Total I/O In: 166.7 [I.V.:26.7; NG/GT:30; TPN:110] Out: 0   General appearance: alert, cooperative, appears stated age, no distress and moderately obese GI: Soft, mildly distended.  Appropriately tender to palpation adjacent to incision.  NG in place with bilious fluid  Lab Results:   Recent Labs  08/29/15 0247 08/30/15 0412  WBC 12.7* 13.7*  HGB 10.8* 9.4*  HCT 33.2* 29.2*  PLT 157 131*   BMET  Recent Labs  08/29/15 0247 08/30/15 0412  NA 136 133*  K 4.2 4.2  CL 100* 100*  CO2 26 26  GLUCOSE 136* 123*  BUN 21* 22*  CREATININE 0.90 0.95  CALCIUM 8.2* 8.1*   PT/INR No results for input(s): LABPROT, INR in the last 72 hours. ABG No results for input(s): PHART, HCO3 in the last 72 hours.  Invalid input(s): PCO2, PO2  Studies/Results: Dg Chest Port 1 View  08/30/2015  CLINICAL DATA:  Postop open heart surgery EXAM: PORTABLE CHEST 1 VIEW COMPARISON:  08/29/2015; 08/28/2015; 08/27/2015 FINDINGS: Grossly unchanged enlarged cardiac silhouette and mediastinal contours post median sternotomy and CABG. Interval placement of enteric tube with tip and side port projecting below the left hemidiaphragm. Otherwise,  stable position of remaining support apparatus. Unchanged trace bilateral effusions with associated bibasilar opacities, left greater than right. Mild pulmonary venous congestion without frank evidence of edema. No new focal airspace opacities. No acute osseus abnormalities. IMPRESSION: 1. Interval placement of enteric tube with tip and side port project below the left hemidiaphragm. 2. Similar findings of hypoventilation, mild pulmonary venous congestion and trace pleural effusions with associated bibasilar atelectasis. Electronically Signed   By: Sandi Mariscal M.D.   On: 08/30/2015 07:18   Dg Chest Port 1 View  08/29/2015  CLINICAL DATA:  Status post CABG EXAM: PORTABLE CHEST 1 VIEW COMPARISON:  08/28/2015 FINDINGS: Cardiomegaly again noted. Status post CABG. Left atrial appendage clip is unchanged in position. No pulmonary edema. No segmental infiltrate. Bilateral mild basilar atelectasis. There is no pneumothorax. Stable right arm PICC line position. IMPRESSION: Status post CABG. Mild basilar atelectasis. No infiltrate or pulmonary edema. Right arm PICC line is unchanged in position. Electronically Signed   By: Lahoma Crocker M.D.   On: 08/29/2015 09:28   Dg Abd Portable 1v  08/30/2015  CLINICAL DATA:  Ileus EXAM: PORTABLE ABDOMEN - 1 VIEW COMPARISON:  08/29/2015; 08/27/2015 FINDINGS: Interval placement of enteric tube with tip and side-port coiled overlying the expected location of the gastric fundus. Worsening -this distention of multiple loops of small bowel with index loop of small bowel within the left mid abdomen measuring approximately 4.4 cm in diameter. These findings are associated with a conspicuous paucity of distal colonic gas. Nondiagnostic evaluation for pneumoperitoneum secondary supine positioning and  exclusion of the lower thorax. No pneumatosis or portal venous gas. No definite intra-abdominal calcifications. Midline skin staples. Post left total hip replacement, incompletely evaluated.  Suspected at least moderate multilevel lumbar spine degenerative change, incompletely evaluated. IMPRESSION: 1. Enteric tube tip and side-port coiled overlying expected location of the gastric fundus. 2. Findings suggestive of worsening small bowel obstruction. Continued attention on follow-up is recommended. Electronically Signed   By: Sandi Mariscal M.D.   On: 08/30/2015 07:20   Dg Abd Portable 1v  08/29/2015  CLINICAL DATA:  Status post colectomy postop day 4 EXAM: PORTABLE ABDOMEN - 1 VIEW COMPARISON:  08/27/2015 FINDINGS: Mild gaseous distended small bowel loops in mid abdomen probable postop ileus. Midline skin staples again noted. Some colonic gas and stool noted in proximal left colon. IMPRESSION: Mild gaseous distended small bowel loops mid abdomen probable postop ileus. Electronically Signed   By: Lahoma Crocker M.D.   On: 08/29/2015 13:04    Anti-infectives: Anti-infectives    Start     Dose/Rate Route Frequency Ordered Stop   08/26/15 1000  cefoTEtan (CEFOTAN) 1 g in dextrose 5 % 50 mL IVPB     1 g 100 mL/hr over 30 Minutes Intravenous  Once 08/26/15 0853 08/26/15 1057   08/25/15 1115  cefoTEtan in Dextrose 5% (CEFOTAN) IVPB 2 g     2 g Intravenous  Once 08/25/15 1111 08/25/15 1150   08/25/15 1113  cefoTEtan in Dextrose 5% (CEFOTAN) 2-2.08 GM-% IVPB    Comments:  [name]   : cabinet override      08/25/15 1113 08/25/15 2329   08/21/15 0400  cefUROXime (ZINACEF) 1.5 g in dextrose 5 % 50 mL IVPB     1.5 g 100 mL/hr over 30 Minutes Intravenous Every 12 hours 08/20/15 1742 08/22/15 1630   08/20/15 2330  vancomycin (VANCOCIN) IVPB 1000 mg/200 mL premix     1,000 mg 200 mL/hr over 60 Minutes Intravenous Every 12 hours 08/20/15 1827 08/21/15 2330   08/20/15 2315  vancomycin (VANCOCIN) IVPB 1000 mg/200 mL premix  Status:  Discontinued     1,000 mg 200 mL/hr over 60 Minutes Intravenous  Once 08/20/15 1742 08/20/15 1827   08/20/15 0400  vancomycin (VANCOCIN) 1,500 mg in sodium chloride  0.9 % 250 mL IVPB  Status:  Discontinued     1,500 mg 125 mL/hr over 120 Minutes Intravenous To Surgery 08/19/15 1605 08/20/15 1523   08/20/15 0400  cefUROXime (ZINACEF) 1.5 g in dextrose 5 % 50 mL IVPB  Status:  Discontinued     1.5 g 100 mL/hr over 30 Minutes Intravenous To Surgery 08/19/15 1605 08/20/15 1523   08/20/15 0400  cefUROXime (ZINACEF) 750 mg in dextrose 5 % 50 mL IVPB  Status:  Discontinued     750 mg 100 mL/hr over 30 Minutes Intravenous To Surgery 08/19/15 1605 08/20/15 1523      Assessment/Plan: s/p Procedure(s): EXPLORATORY LAPAROTOMY (N/A) ASCENDING AND TRANSVERSE COLECTOMY (N/A) POD 5 s/p exlap and R hemicolectomy for cecal volvulus  -Suffering from post operative ileus.  Continue with NPO and NG decompression until he demonstrates return of bowel function.   -Continue TNA for PCM.   -Encourage ambulation.  LOS: 11 days    Victor Diaz 08/30/2015

## 2015-08-31 ENCOUNTER — Inpatient Hospital Stay (HOSPITAL_COMMUNITY): Payer: Medicare Other

## 2015-08-31 LAB — CBC
HCT: 26 % — ABNORMAL LOW (ref 39.0–52.0)
Hemoglobin: 8.7 g/dL — ABNORMAL LOW (ref 13.0–17.0)
MCH: 32.3 pg (ref 26.0–34.0)
MCHC: 33.5 g/dL (ref 30.0–36.0)
MCV: 96.7 fL (ref 78.0–100.0)
Platelets: 124 10*3/uL — ABNORMAL LOW (ref 150–400)
RBC: 2.69 MIL/uL — ABNORMAL LOW (ref 4.22–5.81)
RDW: 15.3 % (ref 11.5–15.5)
WBC: 12.5 10*3/uL — ABNORMAL HIGH (ref 4.0–10.5)

## 2015-08-31 LAB — GLUCOSE, CAPILLARY
GLUCOSE-CAPILLARY: 126 mg/dL — AB (ref 65–99)
Glucose-Capillary: 106 mg/dL — ABNORMAL HIGH (ref 65–99)
Glucose-Capillary: 111 mg/dL — ABNORMAL HIGH (ref 65–99)
Glucose-Capillary: 111 mg/dL — ABNORMAL HIGH (ref 65–99)
Glucose-Capillary: 115 mg/dL — ABNORMAL HIGH (ref 65–99)

## 2015-08-31 MED ORDER — FAT EMULSION 20 % IV EMUL
240.0000 mL | INTRAVENOUS | Status: AC
  Administered 2015-08-31: 240 mL via INTRAVENOUS
  Filled 2015-08-31: qty 250

## 2015-08-31 MED ORDER — CETYLPYRIDINIUM CHLORIDE 0.05 % MT LIQD
7.0000 mL | Freq: Two times a day (BID) | OROMUCOSAL | Status: DC
  Administered 2015-09-01 – 2015-09-07 (×6): 7 mL via OROMUCOSAL

## 2015-08-31 MED ORDER — M.V.I. ADULT IV INJ
INJECTION | INTRAVENOUS | Status: AC
  Administered 2015-08-31: 17:00:00 via INTRAVENOUS
  Filled 2015-08-31: qty 2400

## 2015-08-31 MED ORDER — CHLORHEXIDINE GLUCONATE 0.12 % MT SOLN
15.0000 mL | Freq: Two times a day (BID) | OROMUCOSAL | Status: DC
  Administered 2015-09-01 – 2015-09-09 (×8): 15 mL via OROMUCOSAL
  Filled 2015-08-31 (×11): qty 15

## 2015-08-31 MED ORDER — AMIODARONE LOAD VIA INFUSION
150.0000 mg | Freq: Once | INTRAVENOUS | Status: AC
  Administered 2015-08-31: 150 mg via INTRAVENOUS
  Filled 2015-08-31: qty 83.34

## 2015-08-31 NOTE — Care Management Important Message (Signed)
Important Message  Patient Details  Name: Victor Diaz MRN: [id number] Date of Birth: 1945-06-26   Medicare Important Message Given:  Yes    Bereket Gernert, Kym Groom, RN 08/31/2015, 10:44 AM

## 2015-08-31 NOTE — Progress Notes (Signed)
[facility] BRIEF SICU PROGRESS NOTE  6 Days Post-Op  S/P Procedure(s) (LRB): EXPLORATORY LAPAROTOMY (N/A) ASCENDING AND TRANSVERSE COLECTOMY (N/A)   Stable day In and out of rate-controlled Afib BP stable Breathing comfortably on room air UOP excellent  Plan: Continue current plan  Rexene Alberts, MD 08/31/2015 7:34 PM

## 2015-08-31 NOTE — Progress Notes (Signed)
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 Fib Patient receiving TPN. KUB consistent with ileus-no flatus since laparotomy 5 days ago  Objective: Vital signs in last 24 hours: Temp:  [97.6 F (36.4 C)-98.4 F (36.9 C)] 98.2 F (36.8 C) (05/16 1500) Pulse Rate:  [85] 85 (05/15 2000) Cardiac Rhythm:  [-] Atrial fibrillation (05/16 0745) Resp:  [12-30] 22 (05/16 1600) BP: (105-154)/(52-95) 132/62 mmHg (05/16 1600) SpO2:  [97 %-100 %] 98 % (05/16 1600) Weight:  [220 lb 7.4 oz (100 kg)] 220 lb 7.4 oz (100 kg) (05/16 0600)  Hemodynamic parameters for last 24 hours:  stable  Intake/Output from previous day: 05/15 0701 - 05/16 0700 In: 3060.8 [I.V.:530.8; NG/GT:180; IV Piggyback:110; TPN:2240] Out: 2925 [Urine:2475; Emesis/NG output:450] Intake/Output this shift: Total I/O In: 1443.4 [I.V.:333.4; NG/GT:120; TPN:990] Out: 1725 [Urine:1725]       Exam    General- alert and comfortable   Lungs- clear without rales, wheezes   Cor- regular rate and rhythm, no murmur , gallop   Abdomen- soft, non-tender   Extremities - warm, non-tender, minimal edema   Neuro- oriented, appropriate, no focal weakness   Lab Results:  Recent Labs  08/30/15 0412 08/31/15 0427  WBC 13.7* 12.5*  HGB 9.4* 8.7*  HCT 29.2* 26.0*  PLT 131* 124*   BMET:  Recent Labs  08/29/15 0247 08/30/15 0412  NA 136 133*  K 4.2 4.2  CL 100* 100*  CO2 26 26  GLUCOSE 136* 123*  BUN 21* 22*  CREATININE 0.90 0.95  CALCIUM 8.2* 8.1*    PT/INR: No results for input(s): LABPROT, INR in the last 72 hours. ABG    Component Value Date/Time   PHART 7.461*  08/27/2015 0605   HCO3 27.0* 08/27/2015 0605   TCO2 28.1 08/27/2015 0605   ACIDBASEDEF 1.0 08/25/2015 1503   O2SAT 98.8 08/27/2015 0605   CBG (last 3)   Recent Labs  08/31/15 0852 08/31/15 1255 08/31/15 1646  GLUCAP 126* 111* 111*    Assessment/Plan: S/P Procedure(s) (LRB): EXPLORATORY LAPAROTOMY (N/A) ASCENDING AND TRANSVERSE COLECTOMY (N/A) Continue TPN Continue NG tube Ambulate daily Continue Lovenox, IV amiodarone   LOS: 12 days    Victor Diaz 08/31/2015

## 2015-08-31 NOTE — Significant Event (Signed)
Verbal order from Dr. Prescott Gum for bolus of amiodarone.

## 2015-08-31 NOTE — Progress Notes (Signed)
PARENTERAL NUTRITION CONSULT NOTE - INITIAL  Pharmacy Consult for TPN Indication: ileus  Allergies  Allergen Reactions  . Atorvastatin Anaphylaxis    LIPITOR -  facial swelling    Patient Measurements: Height: 5\' 6"  (167.6 cm) Weight: 220 lb 7.4 oz (100 kg) IBW/kg (Calculated) : 63.8 Adjusted Body Weight: 76 kg Usual Weight: 94.4 kg  Vital Signs: Temp: 98 F (36.7 C) (05/16 0330) Temp Source: Oral (05/16 0330) BP: 132/61 mmHg (05/16 0600) Pulse Rate: 85 (05/15 2000) Intake/Output from previous day: 05/15 0701 - 05/16 0700 In: 2667.1 [I.V.:347.1; NG/GT:180; IV Piggyback:110; TPN:2030] Out: 2825 [Urine:2475; Emesis/NG output:350] Intake/Output from this shift:    Labs:  Recent Labs  08/29/15 0247 08/30/15 0412 08/31/15 0427  WBC 12.7* 13.7* 12.5*  HGB 10.8* 9.4* 8.7*  HCT 33.2* 29.2* 26.0*  PLT 157 131* 124*     Recent Labs  08/28/15 0759 08/29/15 0247 08/30/15 0412  NA 137 136 133*  K 3.7 4.2 4.2  CL 100* 100* 100*  CO2 27 26 26   GLUCOSE 133* 136* 123*  BUN 22* 21* 22*  CREATININE 0.93 0.90 0.95  CALCIUM 8.4* 8.2* 8.1*  MG  --   --  2.2  PHOS  --   --  4.0  PROT  --  5.6* 5.1*  ALBUMIN  --  2.2* 2.3*  AST  --  29 27  ALT  --  17 13*  ALKPHOS  --  39 34*  BILITOT  --  0.9 0.8  PREALBUMIN  --   --  11.2*  TRIG  --   --  77   Estimated Creatinine Clearance: 79 mL/min (by C-G formula based on Cr of 0.95).    Recent Labs  08/30/15 1630 08/30/15 1920 08/31/15 0009  GLUCAP 112* 108* 115*    Medical History: Past Medical History  Diagnosis Date  . Allergy   . Gout   . Hyperlipidemia   . Alcohol abuse   . Hemothorax on right   . Lisfranc's dislocation 10/20/2011  . Right fibular fracture 10/20/2011  . Essential hypertension   . PSVT (paroxysmal supraventricular tachycardia) ([place])   . Paroxysmal atrial flutter ([facility])     Poor anticoagulation candidate with active alcohol abuse and syncope  . Subclavian artery stenosis, left     Status  post stent placement by Dr. Trula Slade 10/2014    Medications:  Prescriptions prior to admission  Medication Sig Dispense Refill Last Dose  . aspirin 325 MG tablet Take 325 mg by mouth daily.    08/19/2015 at 0530  . cetirizine (ZYRTEC) 10 MG tablet TAKE ONE TABLET BY MOUTH ONCE DAILY AS NEEDED. (Patient taking differently: Take 10 mg by mouth daily as needed for allergies. TAKE ONE TABLET BY MOUTH ONCE DAILY AS NEEDED.) 60 tablet 0 08/18/2015 at Unknown time  . metoprolol succinate (TOPROL-XL) 25 MG 24 hr tablet **MUST SCHEDULE APPT FOR FURTHER REFILLS.** (Patient taking differently: Take 25 mg by mouth daily. **MUST SCHEDULE APPT FOR FURTHER REFILLS.**) 180 tablet 0 08/18/2015 at 1600  . nitroGLYCERIN (NITROSTAT) 0.4 MG SL tablet Place 1 tablet (0.4 mg total) under the tongue every 5 (five) minutes as needed. 25 tablet 3 08/19/2015 at 0400  . omeprazole (PRILOSEC) 20 MG capsule TAKE 1 CAPSULE BY MOUTH DAILY AS NEEDED FOR HEART BURN. 90 capsule 1 08/18/2015 at 2000  . simvastatin (ZOCOR) 40 MG tablet TAKE (1) TABLET BY MOUTH AT BEDTIME. 90 tablet 1 08/18/2015 at 2000  . Multiple Vitamin (MULTIVITAMIN WITH MINERALS) TABS  tablet Take 1 tablet by mouth daily. 30 tablet 0 Taking    Insulin Requirements in the past 24 hours:  40 units of Levemir (20 units bid) 0 units of SSI  Assessment: Pt is s/p CABG on 5/5, pt begun complaining of abdominal pain and distention; KUB showed ileus with cecum measuring 15 cm. Surgery was consulted for a possible volvulus. Has a history of ileus that resolved after a couple days s/p prior surgery.  GI: ileus dilated cecum to 15 cm. 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 Had R hemicolectomy. Albumin low at 2.3. Prealbumin up to 11.2. Plan to await return of bowel function. Felt bad on 5/14. NG tube had to be replaced 5/14 d/t an ileus. Had 741ml of bilious fluid come out when NG tube placed. Abd X-ray on 5/16 showed persistent ileus or partial obstruction. NGO/24h: 350 cc. LBM 5/10.  On PPI IV Endo: CBGs controlled (106-125) on SSI + Levemir 20 units BID Lytes: From 5/15: K at 4.2 (goal > 4) on KCL 29mEq daily. Mg 2.2 (goal > 2) Phos 4.0. CoCa 9.2 Renal: SCr stable, CrCl ~43ml/min. UOP stable at 1.43ml/kg/hr. D5NS @ KVO Pulm: Extubated on 5/11. Now on RA Cards: 4 days out from CABG - VSS, intermittent AFib - now back to NSR on ami drip. Continues on lasix Hepatobil: LFTs/Tbili wnl, TG wnl Neuro: hx EtOH abuse, taking beer BID while admitted to prevent withdrawal. On Fent patch for pain control ID: pre-op [email or web address]  Best Practices: Lovenox  TPN Access: PICC placed 5/8 TPN start date: 08/25/15  Nutritional Goals: per RD recs on 5/11 Kcal: 2100-2300 Protein: 115-125 g  Current Nutrition:  NPO Clinimix E 5/15 at 140ml/hr IV lipid emulsions at 79ml/hr  Plan:  Continue Clinimix E 5/15 at 133ml/hr Continue 20% lipid emulsion at 9ml/hr D5NS at 10 ml/hr This provides 120 g of protein and 2184 kCals per day meeting 100% of protein and kcal needs Add MVI and TE in TPN Add thiamine and folic acid to TPN Continue resistant SSI q6h Continue Levemir 20 units BID per MD Monitor TPN labs F/U return of bowel function, advancement of diet  Alycia Rossetti, PharmD, BCPS Clinical Pharmacist Pager: [phone] 08/31/2015 8:02 AM

## 2015-08-31 NOTE — Progress Notes (Signed)
6 Days Post-Op  Subjective: Up in chair, no flatus  Objective: Vital signs in last 24 hours: Temp:  [97.6 F (36.4 C)-98.6 F (37 C)] 98 F (36.7 C) (05/16 0330) Pulse Rate:  [85] 85 (05/15 2000) Resp:  [12-23] 18 (05/16 0600) BP: (107-154)/(53-79) 132/61 mmHg (05/16 0600) SpO2:  [98 %-100 %] 100 % (05/16 0728) Weight:  [100 kg (220 lb 7.4 oz)] 100 kg (220 lb 7.4 oz) (05/16 0600) Last BM Date: 08/25/15  Intake/Output from previous day: 05/15 0701 - 05/16 0700 In: 2767.1 [I.V.:347.1; NG/GT:180; IV Piggyback:110; TPN:2130] Out: 2925 [Urine:2475; Emesis/NG output:450] Intake/Output this shift:    General appearance: cooperative GI: distended but soft, a few BS  Lab Results:   Recent Labs  08/30/15 0412 08/31/15 0427  WBC 13.7* 12.5*  HGB 9.4* 8.7*  HCT 29.2* 26.0*  PLT 131* 124*   BMET  Recent Labs  08/29/15 0247 08/30/15 0412  NA 136 133*  K 4.2 4.2  CL 100* 100*  CO2 26 26  GLUCOSE 136* 123*  BUN 21* 22*  CREATININE 0.90 0.95  CALCIUM 8.2* 8.1*   PT/INR No results for input(s): LABPROT, INR in the last 72 hours. ABG No results for input(s): PHART, HCO3 in the last 72 hours.  Invalid input(s): PCO2, PO2  Studies/Results: Dg Chest Port 1 View  08/30/2015  CLINICAL DATA:  Postop open heart surgery EXAM: PORTABLE CHEST 1 VIEW COMPARISON:  08/29/2015; 08/28/2015; 08/27/2015 FINDINGS: Grossly unchanged enlarged cardiac silhouette and mediastinal contours post median sternotomy and CABG. Interval placement of enteric tube with tip and side port projecting below the left hemidiaphragm. Otherwise, stable position of remaining support apparatus. Unchanged trace bilateral effusions with associated bibasilar opacities, left greater than right. Mild pulmonary venous congestion without frank evidence of edema. No new focal airspace opacities. No acute osseus abnormalities. IMPRESSION: 1. Interval placement of enteric tube with tip and side port project below the left  hemidiaphragm. 2. Similar findings of hypoventilation, mild pulmonary venous congestion and trace pleural effusions with associated bibasilar atelectasis. Electronically Signed   By: Sandi Mariscal M.D.   On: 08/30/2015 07:18   Dg Abd Portable 1v  08/31/2015  CLINICAL DATA:  Follow-up ileus; partial colectomy on Aug 25, 2015 EXAM: PORTABLE ABDOMEN - 1 VIEW COMPARISON:  Portable supine abdominal film of Aug 30, 2015 FINDINGS: There remain loops of mildly distended gas-filled small bowel stacked in the upper and mid and lower abdomen. There is some gas within the transverse colon and rectum. No extraluminal gas collections are observed. There are degenerative changes of the lumbar spine. 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 An esophagogastric tube tip lies in the region of the gastric cardia at the superior margin of the study. IMPRESSION: Persistent small-bowel gas pattern compatible with ileus or partial distal obstruction. There has not been significant change since yesterday's study. Electronically Signed   By: David  Martinique M.D.   On: 08/31/2015 07:41   Dg Abd Portable 1v  08/30/2015  CLINICAL DATA:  Ileus EXAM: PORTABLE ABDOMEN - 1 VIEW COMPARISON:  08/29/2015; 08/27/2015 FINDINGS: Interval placement of enteric tube with tip and side-port coiled overlying the expected location of the gastric fundus. Worsening -this distention of multiple loops of small bowel with index loop of small bowel within the left mid abdomen measuring approximately 4.4 cm in diameter. These findings are associated with a conspicuous paucity of distal colonic gas. Nondiagnostic evaluation for pneumoperitoneum secondary supine positioning and exclusion of the lower thorax. No pneumatosis or portal  venous gas. No definite intra-abdominal calcifications. Midline skin staples. Post left total hip replacement, incompletely evaluated. Suspected at least moderate multilevel lumbar spine degenerative change, incompletely evaluated.  IMPRESSION: 1. Enteric tube tip and side-port coiled overlying expected location of the gastric fundus. 2. Findings suggestive of worsening small bowel obstruction. Continued attention on follow-up is recommended. Electronically Signed   By: Sandi Mariscal M.D.   On: 08/30/2015 07:20   Dg Abd Portable 1v  08/29/2015  CLINICAL DATA:  Status post colectomy postop day 4 EXAM: PORTABLE ABDOMEN - 1 VIEW COMPARISON:  08/27/2015 FINDINGS: Mild gaseous distended small bowel loops in mid abdomen probable postop ileus. Midline skin staples again noted. Some colonic gas and stool noted in proximal left colon. IMPRESSION: Mild gaseous distended small bowel loops mid abdomen probable postop ileus. Electronically Signed   By: Lahoma Crocker M.D.   On: 08/29/2015 13:04    Anti-infectives: Anti-infectives    Start     Dose/Rate Route Frequency Ordered Stop   08/26/15 1000  cefoTEtan (CEFOTAN) 1 g in dextrose 5 % 50 mL IVPB     1 g 100 mL/hr over 30 Minutes Intravenous  Once 08/26/15 0853 08/26/15 1057   08/25/15 1115  cefoTEtan in Dextrose 5% (CEFOTAN) IVPB 2 g     2 g Intravenous  Once 08/25/15 1111 08/25/15 1150   08/25/15 1113  cefoTEtan in Dextrose 5% (CEFOTAN) 2-2.08 GM-% IVPB    Comments:  [name]   : cabinet override      08/25/15 1113 08/25/15 2329   08/21/15 0400  cefUROXime (ZINACEF) 1.5 g in dextrose 5 % 50 mL IVPB     1.5 g 100 mL/hr over 30 Minutes Intravenous Every 12 hours 08/20/15 1742 08/22/15 1630   08/20/15 2330  vancomycin (VANCOCIN) IVPB 1000 mg/200 mL premix     1,000 mg 200 mL/hr over 60 Minutes Intravenous Every 12 hours 08/20/15 1827 08/21/15 2330   08/20/15 2315  vancomycin (VANCOCIN) IVPB 1000 mg/200 mL premix  Status:  Discontinued     1,000 mg 200 mL/hr over 60 Minutes Intravenous  Once 08/20/15 1742 08/20/15 1827   08/20/15 0400  vancomycin (VANCOCIN) 1,500 mg in sodium chloride 0.9 % 250 mL IVPB  Status:  Discontinued     1,500 mg 125 mL/hr over 120 Minutes Intravenous To  Surgery 08/19/15 1605 08/20/15 1523   08/20/15 0400  cefUROXime (ZINACEF) 1.5 g in dextrose 5 % 50 mL IVPB  Status:  Discontinued     1.5 g 100 mL/hr over 30 Minutes Intravenous To Surgery 08/19/15 1605 08/20/15 1523   08/20/15 0400  cefUROXime (ZINACEF) 750 mg in dextrose 5 % 50 mL IVPB  Status:  Discontinued     750 mg 100 mL/hr over 30 Minutes Intravenous To Surgery 08/19/15 1605 08/20/15 1523      Assessment/Plan: s/p Procedure(s): EXPLORATORY LAPAROTOMY (N/A) ASCENDING AND TRANSVERSE COLECTOMY (N/A) 6 Days Post-Op  Subjective: POD 5 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 Ng placed yesterday as he was found to have ileus on imaging and was nauseated with episode of emesis.  700cc of bilious fluid on placement of NG.  Objective: Vital signs in last 24 hours: Temp:  [97.6 F (36.4 C)-98.6 F (37 C)] 98 F (36.7 C) (05/16 0330) Pulse Rate:  [85] 85 (05/15 2000) Resp:  [12-23] 18 (05/16 0600) BP: (107-154)/(53-79) 132/61 mmHg (05/16 0600) SpO2:  [98 %-100 %] 100 % (05/16 0728) Weight:  [100 kg (220 lb 7.4 oz)] 100 kg (  220 lb 7.4 oz) (05/16 0600) Last BM Date: 08/25/15  Intake/Output from previous day: 05/15 0701 - 05/16 0700 In: 2767.1 [I.V.:347.1; NG/GT:180; IV Piggyback:110; TPN:2130] Out: 2925 [Urine:2475; Emesis/NG output:450] Intake/Output this shift:    General appearance: alert, cooperative, appears stated age, no distress and moderately obese GI: Soft, mildly distended.  Appropriately tender to palpation adjacent to incision.  NG in place with bilious fluid  Lab Results:   Recent Labs  08/30/15 0412 08/31/15 0427  WBC 13.7* 12.5*  HGB 9.4* 8.7*  HCT 29.2* 26.0*  PLT 131* 124*   BMET  Recent Labs  08/29/15 0247 08/30/15 0412  NA 136 133*  K 4.2 4.2  CL 100* 100*  CO2 26 26  GLUCOSE 136* 123*  BUN 21* 22*  CREATININE 0.90 0.95  CALCIUM 8.2* 8.1*   PT/INR No results for input(s): LABPROT, INR in the last 72 hours. ABG No results  for input(s): PHART, HCO3 in the last 72 hours.  Invalid input(s): PCO2, PO2  Studies/Results: Dg Chest Port 1 View  08/30/2015  CLINICAL DATA:  Postop open heart surgery EXAM: PORTABLE CHEST 1 VIEW COMPARISON:  08/29/2015; 08/28/2015; 08/27/2015 FINDINGS: Grossly unchanged enlarged cardiac silhouette and mediastinal contours post median sternotomy and CABG. Interval placement of enteric tube with tip and side port projecting below the left hemidiaphragm. Otherwise, stable position of remaining support apparatus. Unchanged trace bilateral effusions with associated bibasilar opacities, left greater than right. Mild pulmonary venous congestion without frank evidence of edema. No new focal airspace opacities. No acute osseus abnormalities. IMPRESSION: 1. Interval placement of enteric tube with tip and side port project below the left hemidiaphragm. 2. Similar findings of hypoventilation, mild pulmonary venous congestion and trace pleural effusions with associated bibasilar atelectasis. Electronically Signed   By: Sandi Mariscal M.D.   On: 08/30/2015 07:18   Dg Abd Portable 1v  08/31/2015  CLINICAL DATA:  Follow-up ileus; partial colectomy on Aug 25, 2015 EXAM: PORTABLE ABDOMEN - 1 VIEW COMPARISON:  Portable supine abdominal film of Aug 30, 2015 FINDINGS: There remain loops of mildly distended gas-filled small bowel stacked in the upper and mid and lower abdomen. There is some gas within the transverse colon and rectum. No extraluminal gas collections are observed. There are degenerative changes of the lumbar spine. 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 An esophagogastric tube tip lies in the region of the gastric cardia at the superior margin of the study. IMPRESSION: Persistent small-bowel gas pattern compatible with ileus or partial distal obstruction. There has not been significant change since yesterday's study. Electronically Signed   By: David  Martinique M.D.   On: 08/31/2015 07:41   Dg Abd Portable  1v  08/30/2015  CLINICAL DATA:  Ileus EXAM: PORTABLE ABDOMEN - 1 VIEW COMPARISON:  08/29/2015; 08/27/2015 FINDINGS: Interval placement of enteric tube with tip and side-port coiled overlying the expected location of the gastric fundus. Worsening -this distention of multiple loops of small bowel with index loop of small bowel within the left mid abdomen measuring approximately 4.4 cm in diameter. These findings are associated with a conspicuous paucity of distal colonic gas. Nondiagnostic evaluation for pneumoperitoneum secondary supine positioning and exclusion of the lower thorax. No pneumatosis or portal venous gas. No definite intra-abdominal calcifications. Midline skin staples. Post left total hip replacement, incompletely evaluated. Suspected at least moderate multilevel lumbar spine degenerative change, incompletely evaluated. IMPRESSION: 1. Enteric tube tip and side-port coiled overlying expected location of the gastric fundus. 2. Findings suggestive of worsening small bowel  obstruction. Continued attention on follow-up is recommended. Electronically Signed   By: Sandi Mariscal M.D.   On: 08/30/2015 07:20   Dg Abd Portable 1v  08/29/2015  CLINICAL DATA:  Status post colectomy postop day 4 EXAM: PORTABLE ABDOMEN - 1 VIEW COMPARISON:  08/27/2015 FINDINGS: Mild gaseous distended small bowel loops in mid abdomen probable postop ileus. Midline skin staples again noted. Some colonic gas and stool noted in proximal left colon. IMPRESSION: Mild gaseous distended small bowel loops mid abdomen probable postop ileus. Electronically Signed   By: Lahoma Crocker M.D.   On: 08/29/2015 13:04    Anti-infectives: Anti-infectives    Start     Dose/Rate Route Frequency Ordered Stop   08/26/15 1000  cefoTEtan (CEFOTAN) 1 g in dextrose 5 % 50 mL IVPB     1 g 100 mL/hr over 30 Minutes Intravenous  Once 08/26/15 0853 08/26/15 1057   08/25/15 1115  cefoTEtan in Dextrose 5% (CEFOTAN) IVPB 2 g     2 g Intravenous  Once 08/25/15  1111 08/25/15 1150   08/25/15 1113  cefoTEtan in Dextrose 5% (CEFOTAN) 2-2.08 GM-% IVPB    Comments:  [name]   : cabinet override      08/25/15 1113 08/25/15 2329   08/21/15 0400  cefUROXime (ZINACEF) 1.5 g in dextrose 5 % 50 mL IVPB     1.5 g 100 mL/hr over 30 Minutes Intravenous Every 12 hours 08/20/15 1742 08/22/15 1630   08/20/15 2330  vancomycin (VANCOCIN) IVPB 1000 mg/200 mL premix     1,000 mg 200 mL/hr over 60 Minutes Intravenous Every 12 hours 08/20/15 1827 08/21/15 2330   08/20/15 2315  vancomycin (VANCOCIN) IVPB 1000 mg/200 mL premix  Status:  Discontinued     1,000 mg 200 mL/hr over 60 Minutes Intravenous  Once 08/20/15 1742 08/20/15 1827   08/20/15 0400  vancomycin (VANCOCIN) 1,500 mg in sodium chloride 0.9 % 250 mL IVPB  Status:  Discontinued     1,500 mg 125 mL/hr over 120 Minutes Intravenous To Surgery 08/19/15 1605 08/20/15 1523   08/20/15 0400  cefUROXime (ZINACEF) 1.5 g in dextrose 5 % 50 mL IVPB  Status:  Discontinued     1.5 g 100 mL/hr over 30 Minutes Intravenous To Surgery 08/19/15 1605 08/20/15 1523   08/20/15 0400  cefUROXime (ZINACEF) 750 mg in dextrose 5 % 50 mL IVPB  Status:  Discontinued     750 mg 100 mL/hr over 30 Minutes Intravenous To Surgery 08/19/15 1605 08/20/15 1523      Assessment/Plan: s/p Procedure(s): EXPLORATORY LAPAROTOMY (N/A) ASCENDING AND TRANSVERSE COLECTOMY (N/A) POD6  - ileus.  Continue with NPO and NG decompression until he demonstrates return of bowel function.   - protein calorie malnutrition - TNA - mobilizing - OK for SDU from my standpoint   LOS: 12 days    Lashaunda Schild E 08/31/2015   LOS: 12 days    Keith Felten E 08/31/2015

## 2015-08-31 NOTE — Plan of Care (Signed)
Problem: Activity: Goal: Risk for activity intolerance will decrease Outcome: Progressing Ambulated x2 today with RN with minimal assist and wheelchair for stability. Agreeable to ambulating in AM as well.  Problem: Respiratory: Goal: Levels of oxygenation will improve Outcome: Progressing Pt O2 sats 97% on room air. Does still get SOB with no desaturation when ambulating.  Problem: Urinary Elimination: Goal: Ability to achieve and maintain adequate renal perfusion and functioning will improve Outcome: Completed/Met Date Met:  08/31/15 Patient urinating on own with urinal every 1-2 hours. Creatine and BUN labs WNL. 2500 mL urine out today.

## 2015-09-01 ENCOUNTER — Inpatient Hospital Stay (HOSPITAL_COMMUNITY): Payer: Medicare Other

## 2015-09-01 LAB — GLUCOSE, CAPILLARY
GLUCOSE-CAPILLARY: 122 mg/dL — AB (ref 65–99)
GLUCOSE-CAPILLARY: 127 mg/dL — AB (ref 65–99)
GLUCOSE-CAPILLARY: 134 mg/dL — AB (ref 65–99)
Glucose-Capillary: 124 mg/dL — ABNORMAL HIGH (ref 65–99)

## 2015-09-01 LAB — CBC
HCT: 28.1 % — ABNORMAL LOW (ref 39.0–52.0)
Hemoglobin: 9 g/dL — ABNORMAL LOW (ref 13.0–17.0)
MCH: 31 pg (ref 26.0–34.0)
MCHC: 32 g/dL (ref 30.0–36.0)
MCV: 96.9 fL (ref 78.0–100.0)
Platelets: 155 10*3/uL (ref 150–400)
RBC: 2.9 MIL/uL — ABNORMAL LOW (ref 4.22–5.81)
RDW: 15.3 % (ref 11.5–15.5)
WBC: 11.7 10*3/uL — ABNORMAL HIGH (ref 4.0–10.5)

## 2015-09-01 MED ORDER — TRACE MINERALS CR-CU-MN-SE-ZN 10-1000-500-60 MCG/ML IV SOLN
INTRAVENOUS | Status: AC
  Administered 2015-09-01: 17:00:00 via INTRAVENOUS
  Filled 2015-09-01: qty 2400

## 2015-09-01 MED ORDER — FAT EMULSION 20 % IV EMUL
240.0000 mL | INTRAVENOUS | Status: AC
  Administered 2015-09-01: 240 mL via INTRAVENOUS
  Filled 2015-09-01: qty 250

## 2015-09-01 NOTE — Progress Notes (Signed)
7 Days Post-Op Procedure(s) (LRB): EXPLORATORY LAPAROTOMY (N/A) ASCENDING AND TRANSVERSE COLECTOMY (N/A) Subjective: Patient examined and x-rays personally reviewed Status post emergent right hemicolectomy after CABG for non-ST elevation MI Postoperative ileus but patient has started to pass flatus Continues on TPN for nutrition Maintaining sinus rhythm-had atrial clip at time of surgery for history of PAF. Patient not a Coumadin candidate because of his long-term alcohol abuse We'll transfer to step down today, continue n.p.o. With NG suction per general surgery  Objective: Vital signs in last 24 hours: Temp:  [97.8 F (36.6 C)-98.4 F (36.9 C)] 98.1 F (36.7 C) (05/17 0746) Cardiac Rhythm:  [-] Normal sinus rhythm (05/17 0800) Resp:  [13-34] 34 (05/17 1000) BP: (111-174)/(52-106) 157/78 mmHg (05/17 1000) SpO2:  [96 %-100 %] 100 % (05/17 1000) Weight:  [218 lb 7.6 oz (99.1 kg)] 218 lb 7.6 oz (99.1 kg) (05/17 0600)  Hemodynamic parameters for last 24 hours:  sinus rhythm stable rhythm afebrile  Intake/Output from previous day: 05/16 0701 - 05/17 0700 In: 3483.9 [I.V.:733.9; NG/GT:210; TPN:2540] Out: [id number] [Urine:3050; Emesis/NG output:725] Intake/Output this shift: Total I/O In: 373.4 [I.V.:53.4; NG/GT:120; TPN:200] Out: 200 [Urine:200]  Alert and comfortable in chair Lungs clear Sternal incision clean and dry Abdomen distended but nontender with diminished bowel sounds  Lab Results:  Recent Labs  08/31/15 0427 09/01/15 0416  WBC 12.5* 11.7*  HGB 8.7* 9.0*  HCT 26.0* 28.1*  PLT 124* 155   BMET:  Recent Labs  08/30/15 0412  NA 133*  K 4.2  CL 100*  CO2 26  GLUCOSE 123*  BUN 22*  CREATININE 0.95  CALCIUM 8.1*    PT/INR: No results for input(s): LABPROT, INR in the last 72 hours. ABG    Component Value Date/Time   PHART 7.461* 08/27/2015 0605   HCO3 27.0* 08/27/2015 0605   TCO2 28.1 08/27/2015 0605   ACIDBASEDEF 1.0 08/25/2015 1503   O2SAT 98.8  08/27/2015 0605   CBG (last 3)   Recent Labs  08/31/15 1646 08/31/15 2345 09/01/15 0554  GLUCAP 111* 127* 124*    Assessment/Plan: S/P Procedure(s) (LRB): EXPLORATORY LAPAROTOMY (N/A) ASCENDING AND TRANSVERSE COLECTOMY (N/A) Mobilize Diuresis Plan for transfer to step-down: see transfer orders Continue TPN   LOS: 13 days    Tharon Aquas Trigt III 09/01/2015

## 2015-09-01 NOTE — Progress Notes (Addendum)
Nutrition Follow Up  DOCUMENTATION CODES:   Obesity unspecified  INTERVENTION:    TPN per pharmacy  NUTRITION DIAGNOSIS:   Inadequate oral intake related to altered GI function as evidenced by NPO status, ongoing  GOAL:   Patient will meet greater than or equal to 90% of their needs, met  MONITOR:   Diet advancement, Labs, Weight trends, Skin, I & O's, TPN prescription   ASSESSMENT:   70 yo M POD 5 s/p 3 vessel CABG for CAD who over the past two days has noticed increasing abdominal distention. He states that he has not had passage of flatus in two days. Yesterday, imaging of the abdomen demonstrated an enlarged colon with distention in the cecum to 15cm. GI was consulted and attempt at placement of a rectal tube was done. Minimal stool was evacuated. Today on imaging, persistent dilation was noted to 15cm and Surgery was consulted over concern for possible volvulus.  Patient s/p procedures 5/10: EXPLORATORY LAPAROTOMY  RIGHT HEMI-COLECTOMY WITH ANASTAMOSIS  Pt continues with NPO status >> feeling nauseous.  Findings suggest SBO per DG Abd Portable 5/17. NGT in place for decompression. On IV Reglan.  Patient is receiving TPN with Clinimix E 5/15 @ 100 ml/hr and lipids @ 10 ml/hr. Provides 2184 kcal and 120 grams protein per day. Meets 100% minimum estimated energy needs and 100% minimum estimated protein needs.  Diet Order:  TPN (CLINIMIX-E) Adult TPN (CLINIMIX-E) Adult  Skin:  Reviewed, no issues  Last BM:  5/3  Height:   Ht Readings from Last 1 Encounters:  08/19/15 5' 6"  (1.676 m)    Weight:   Wt Readings from Last 1 Encounters:  09/01/15 218 lb 7.6 oz (99.1 kg)    Ideal Body Weight:  64.5 kg  BMI:  Body mass index is 35.28 kg/(m^2).  Estimated Nutritional Needs:   Kcal:  2100-2300  Protein:  115-125 gm  Fluid:  per MD  EDUCATION NEEDS:   No education needs identified at this time  Arthur Holms, RD, LDN Pager #:  [phone] After-Hours Pager #: [phone]

## 2015-09-01 NOTE — Significant Event (Signed)
Patient ambulated half-way to 2W20-walked approximately 256feet, then taken via wheelchair. VS stable prior and during the transfer. Patient settled in chair. Family called by patient regarding the transfer. Patient has his cellphone and charger at bedside of new room. Report given to receiving RN. Staff in room prior to RN leaving.

## 2015-09-01 NOTE — Progress Notes (Signed)
Physical Therapy Treatment Patient Details Name: Victor Diaz MRN: [id number] DOB: 04-Nov-1945 Today's Date: 09/01/2015    History of Present Illness pt presents with CABG x4 on 5/6; Rt hemicolectomy 5/10.  pt with hx of heavy Etoh use, L THA, Gout, HTN, and A-Flutter.      PT Comments    Pt admitted with above diagnosis. Pt currently with functional limitations due to balance and endurance deficits. Pt able to ambulate however limited by drop in BP today.  Will continue as pt able.  Progressing.  Feel pt will still benefit from Rehab.   Pt will benefit from skilled PT to increase their independence and safety with mobility to allow discharge to the venue listed below.    Follow Up Recommendations  CIR     Equipment Recommendations  None recommended by PT    Recommendations for Other Services       Precautions / Restrictions Precautions Precautions: Fall;Sternal Restrictions Weight Bearing Restrictions: Yes (sternal precautions)    Mobility  Bed Mobility               General bed mobility comments: in chair on arrival  Transfers Overall transfer level: Needs assistance Equipment used: Pushed w/c Transfers: Sit to/from Stand Sit to Stand: Min guard         General transfer comment: Held pillow and was able to power up with LEs only.  Did rock for momentum  Ambulation/Gait Ambulation/Gait assistance: Min guard Ambulation Distance (Feet): 125 Feet Assistive device:  (pushing wheelchair) Gait Pattern/deviations: Step-through pattern;Decreased stride length;Trunk flexed   Gait velocity interpretation: Below normal speed for age/gender General Gait Details: pt needs cues for pacing and slowing breathing.  Pt reported fatigue and not feeling well therefore only went around short circle.  However once BP monitored, pt did have drop in BP.  Made nurse aware. BP recovered once pt sat.     Stairs            Wheelchair Mobility    Modified Rankin (Stroke  Patients Only)       Balance Overall balance assessment: Needs assistance Sitting-balance support: No upper extremity supported;Feet supported Sitting balance-Leahy Scale: Fair     Standing balance support: Bilateral upper extremity supported;During functional activity Standing balance-Leahy Scale: Poor Standing balance comment: Requires min assist to min guard assist for balance as he is unsteady at times.              High level balance activites: Direction changes;Turns;Sudden stops High Level Balance Comments: Needs min guard assist with above.     Cognition Arousal/Alertness: Awake/alert Behavior During Therapy: WFL for tasks assessed/performed Overall Cognitive Status: Within Functional Limits for tasks assessed                      Exercises General Exercises - Lower Extremity Ankle Circles/Pumps: AROM;Both;10 reps;Seated Long Arc Quad: AROM;Both;10 reps;Seated    General Comments        Pertinent Vitals/Pain Pain Assessment: Faces Faces Pain Scale: Hurts little more Pain Location: abdomen Pain Descriptors / Indicators: Operative site guarding Pain Intervention(s): Monitored during session;Limited activity within patient's tolerance;Premedicated before session;Repositioned  BP initially was 128/69 with HR 100.  Dropped at end of walk to 77/60 with HR 116 bpm.  BP upon departure 108/69 with HR 95bpm.      Home Living                      Prior Function  PT Goals (current goals can now be found in the care plan section) Progress towards PT goals: Progressing toward goals    Frequency  Min 3X/week    PT Plan Current plan remains appropriate    Co-evaluation             End of Session Equipment Utilized During Treatment: Gait belt Activity Tolerance: Patient limited by fatigue Patient left: in chair;with call bell/phone within reach     Time: 1157-1220 PT Time Calculation (min) (ACUTE ONLY): 23 min  Charges:   $Gait Training: 8-22 mins $Therapeutic Exercise: 8-22 mins                    G Codes:      WhiteArrie Aran F Sep 24, 2015, 1:58 PM [employer] [facility] [phone] (pager)

## 2015-09-01 NOTE — Progress Notes (Signed)
PARENTERAL NUTRITION CONSULT NOTE - INITIAL  Pharmacy Consult for TPN Indication: ileus  Allergies  Allergen Reactions  . Atorvastatin Anaphylaxis    LIPITOR -  facial swelling    Patient Measurements: Height: 5\' 6"  (167.6 cm) Weight: 218 lb 7.6 oz (99.1 kg) IBW/kg (Calculated) : 63.8 Adjusted Body Weight: 76 kg Usual Weight: 94.4 kg  Vital Signs: Temp: 97.9 F (36.6 C) (05/17 0400) Temp Source: Oral (05/17 0400) BP: 148/63 mmHg (05/17 0630) Intake/Output from previous day: 05/16 0701 - 05/17 0700 In: 3357.2 [I.V.:707.2; NG/GT:210; TPN:2440] Out: 3775 [Urine:3050; Emesis/NG output:725] Intake/Output from this shift:    Labs:  Recent Labs  08/30/15 0412 08/31/15 0427 09/01/15 0416  WBC 13.7* 12.5* 11.7*  HGB 9.4* 8.7* 9.0*  HCT 29.2* 26.0* 28.1*  PLT 131* 124* 155     Recent Labs  08/30/15 0412  NA 133*  K 4.2  CL 100*  CO2 26  GLUCOSE 123*  BUN 22*  CREATININE 0.95  CALCIUM 8.1*  MG 2.2  PHOS 4.0  PROT 5.1*  ALBUMIN 2.3*  AST 27  ALT 13*  ALKPHOS 34*  BILITOT 0.8  PREALBUMIN 11.2*  TRIG 77   Estimated Creatinine Clearance: 78.6 mL/min (by C-G formula based on Cr of 0.95).    Recent Labs  08/31/15 1646 08/31/15 2345 09/01/15 0554  GLUCAP 111* 127* 124*    Medical History: Past Medical History  Diagnosis Date  . Allergy   . Gout   . Hyperlipidemia   . Alcohol abuse   . Hemothorax on right   . Lisfranc's dislocation 10/20/2011  . Right fibular fracture 10/20/2011  . Essential hypertension   . PSVT (paroxysmal supraventricular tachycardia) ([place])   . Paroxysmal atrial flutter ([facility])     Poor anticoagulation candidate with active alcohol abuse and syncope  . Subclavian artery stenosis, left     Status post stent placement by Dr. Trula Slade 10/2014    Medications:  Prescriptions prior to admission  Medication Sig Dispense Refill Last Dose  . aspirin 325 MG tablet Take 325 mg by mouth daily.    08/19/2015 at 0530  . cetirizine (ZYRTEC)  10 MG tablet TAKE ONE TABLET BY MOUTH ONCE DAILY AS NEEDED. (Patient taking differently: Take 10 mg by mouth daily as needed for allergies. TAKE ONE TABLET BY MOUTH ONCE DAILY AS NEEDED.) 60 tablet 0 08/18/2015 at Unknown time  . metoprolol succinate (TOPROL-XL) 25 MG 24 hr tablet **MUST SCHEDULE APPT FOR FURTHER REFILLS.** (Patient taking differently: Take 25 mg by mouth daily. **MUST SCHEDULE APPT FOR FURTHER REFILLS.**) 180 tablet 0 08/18/2015 at 1600  . nitroGLYCERIN (NITROSTAT) 0.4 MG SL tablet Place 1 tablet (0.4 mg total) under the tongue every 5 (five) minutes as needed. 25 tablet 3 08/19/2015 at 0400  . omeprazole (PRILOSEC) 20 MG capsule TAKE 1 CAPSULE BY MOUTH DAILY AS NEEDED FOR HEART BURN. 90 capsule 1 08/18/2015 at 2000  . simvastatin (ZOCOR) 40 MG tablet TAKE (1) TABLET BY MOUTH AT BEDTIME. 90 tablet 1 08/18/2015 at 2000  . Multiple Vitamin (MULTIVITAMIN WITH MINERALS) TABS tablet Take 1 tablet by mouth daily. 30 tablet 0 Taking    Insulin Requirements in the past 24 hours:  40 units of Levemir (20 units bid) 6 units of SSI  Assessment: Pt is s/p CABG on 5/5, pt begun complaining of abdominal pain and distention; KUB showed ileus with cecum measuring 15 cm. Surgery was consulted for a possible volvulus. Has a history of ileus that resolved after a couple days  s/p prior surgery.  GI: ileus dilated cecum to 15 cm. 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 Had R hemicolectomy. Albumin low at 2.3. Prealbumin up to 11.2. Plan to await return of bowel function. Felt bad on 5/14. NG tube had to be replaced 5/14 d/t an ileus. NG tube had 772ml of ouput yesterday. Abd X-ray on 5/17 still show SBO. LBM 5/10. Passed flatus x 2 yesterday but no BM. On PPI IV Endo: CBGs controlled (110-120s) on SSI + Levemir 20 units BID Lytes: From 5/15: K at 4.2 (goal > 4) on KCL 29mEq daily. Mg 2.2 (goal > 2) Phos 4.0. CoCa 9.2 Renal: SCr stable, CrCl ~58ml/min. UOP stable at 1.69ml/kg/hr. D5NS @ KVO Pulm: Extubated on  5/11. Now on RA Cards: 4 days out from CABG. BP elevated and HR ok (Afib) Amio gtt at 30mg /hr. Neo gtt off. Continues on lasix Hepatobil: LFTs/Tbili wnl, TG wnl Neuro: hx EtOH abuse, taking beer BID while admitted to prevent withdrawal. On Fent patch for pain control ID: Afebrile, WBC 11.7  Best Practices: Lovenox  TPN Access: PICC placed 5/8 TPN start date: 08/25/15  Nutritional Goals: per RD recs on 5/11 Kcal: 2100-2300 Protein: 115-125 g  Current Nutrition:  NPO Clinimix E 5/15 at 117ml/hr IV lipid emulsions at 22ml/hr  Plan:  Continue Clinimix E 5/15 at 182ml/hr Continue 20% lipid emulsion at 47ml/hr D5NS at 10 ml/hr This provides 120 g of protein and 2184 kCals per day meeting 100% of protein and kcal needs Add MVI and TE in TPN Add thiamine and folic acid to TPN Continue resistant SSI q6h Continue Levemir 20 units BID per MD Monitor TPN labs F/U return of bowel function, advancement of diet  Elenor Quinones, PharmD, [facility] Clinical Pharmacist Pager [phone] 09/01/2015 7:26 AM

## 2015-09-01 NOTE — Progress Notes (Signed)
Pt transferred to 2w20 from [place]. Pts NG hooked up to intermittent wall suction. Tan/Yellow fluid noted to be draining. VSS. Pt oriented to room. Call bell and phone within reach. Will continue to monitor.

## 2015-09-01 NOTE — Progress Notes (Signed)
Patient ID: CUTTER CRUSAN, male   DOB: 05-29-1945, 70 y.o.   MRN: [id number] EVENING ROUNDS NOTE :     [address]       [place] [address]             [phone]                 7 Days Post-Op Procedure(s) (LRB): EXPLORATORY LAPAROTOMY (N/A) ASCENDING AND TRANSVERSE COLECTOMY (N/A)  Total Length of Stay:  LOS: 13 days  BP 135/63 mmHg  Pulse 85  Temp(Src) 98.1 F (36.7 C) (Oral)  Resp 31  Ht 5\' 6"  (1.676 m)  Wt 99.1 kg (218 lb 7.6 oz)  BMI 35.28 kg/m2  SpO2 96%  .Intake/Output      05/16 0701 - 05/17 0700 05/17 0701 - 05/18 0700   I.V. (mL/kg) 733.9 (7.4) 240.3 (2.4)   NG/GT 210 120   IV Piggyback     TPN 2550 930   Total Intake(mL/kg) 3493.9 (35.3) 1290.3 (13)   Urine (mL/kg/hr) 3050 (1.3) 1350 (1.4)   Emesis/NG output 725 (0.3) 0 (0)   Total Output 3775 1350   Net -281.1 -59.7          . amiodarone 30 mg/hr (09/01/15 0600)  . dextrose 5 % and 0.9% NaCl 10 mL/hr at 08/31/15 2010  . Marland KitchenTPN (CLINIMIX-E) Adult 100 mL/hr at 08/31/15 1722   And  . fat emulsion 240 mL (08/31/15 1723)  . Marland KitchenTPN (CLINIMIX-E) Adult     And  . fat emulsion    . lactated ringers 10 mL/hr at 08/25/15 1000  . phenylephrine (NEO-SYNEPHRINE) Adult infusion Stopped (08/25/15 1700)     Lab Results  Component Value Date   WBC 11.7* 09/01/2015   HGB 9.0* 09/01/2015   HCT 28.1* 09/01/2015   PLT 155 09/01/2015   GLUCOSE 123* 08/30/2015   CHOL 160 11/19/2013   TRIG 77 08/30/2015   HDL 36.50* 11/19/2013   LDLDIRECT 85.3 12/06/2012   LDLCALC 88 11/19/2013   ALT 13* 08/30/2015   AST 27 08/30/2015   NA 133* 08/30/2015   K 4.2 08/30/2015   CL 100* 08/30/2015   CREATININE 0.95 08/30/2015   BUN 22* 08/30/2015   CO2 26 08/30/2015   TSH 3.418 10/14/2014   PSA 0.47 11/03/2011   INR 1.29 08/25/2015   HGBA1C 5.6 08/19/2015   Ng still in place ,decreased output , flatus but no bm yet  Grace Isaac MD  Beeper [phone] Office [phone] 09/01/2015 4:41 PM

## 2015-09-01 NOTE — Progress Notes (Signed)
7 Days Post-Op  Subjective: POD 7 s/p exlap and R hemicolectomy Passed flatus x 2 this morning.  No bowel movement. OOB and in chair at time of exam.  NAE overnight.  Objective: Vital signs in last 24 hours: Temp:  [97.8 F (36.6 C)-98.4 F (36.9 C)] 98.1 F (36.7 C) (05/17 0746) Resp:  [13-30] 26 (05/17 0630) BP: (105-174)/(52-106) 148/63 mmHg (05/17 0630) SpO2:  [96 %-100 %] 99 % (05/17 0630) Weight:  [99.1 kg (218 lb 7.6 oz)] 99.1 kg (218 lb 7.6 oz) (05/17 0600) Last BM Date: 08/18/15  Intake/Output from previous day: 05/16 0701 - 05/17 0700 In: 3357.2 [I.V.:707.2; NG/GT:210; TPN:2440] Out: 3775 [Urine:3050; Emesis/NG output:725] Intake/Output this shift: Total I/O In: -  Out: 200 [Urine:200]  General appearance: alert, cooperative, no distress and moderately obese GI: Soft, nontender.  Distended.  Tympanitic.  Dressing changed.  Wound looks good without evidence of infection.  Lab Results:   Recent Labs  08/31/15 0427 09/01/15 0416  WBC 12.5* 11.7*  HGB 8.7* 9.0*  HCT 26.0* 28.1*  PLT 124* 155   BMET  Recent Labs  08/30/15 0412  NA 133*  K 4.2  CL 100*  CO2 26  GLUCOSE 123*  BUN 22*  CREATININE 0.95  CALCIUM 8.1*   PT/INR No results for input(s): LABPROT, INR in the last 72 hours. ABG No results for input(s): PHART, HCO3 in the last 72 hours.  Invalid input(s): PCO2, PO2  Studies/Results: Dg Abd Portable 1v  08/31/2015  CLINICAL DATA:  Follow-up ileus; partial colectomy on Aug 25, 2015 EXAM: PORTABLE ABDOMEN - 1 VIEW COMPARISON:  Portable supine abdominal film of Aug 30, 2015 FINDINGS: There remain loops of mildly distended gas-filled small bowel stacked in the upper and mid and lower abdomen. There is some gas within the transverse colon and rectum. No extraluminal gas collections are observed. There are degenerative changes of the lumbar spine. 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 An esophagogastric tube tip lies in the region of the gastric  cardia at the superior margin of the study. IMPRESSION: Persistent small-bowel gas pattern compatible with ileus or partial distal obstruction. There has not been significant change since yesterday's study. Electronically Signed   By: David  Martinique M.D.   On: 08/31/2015 07:41    Anti-infectives: Anti-infectives    Start     Dose/Rate Route Frequency Ordered Stop   08/26/15 1000  cefoTEtan (CEFOTAN) 1 g in dextrose 5 % 50 mL IVPB     1 g 100 mL/hr over 30 Minutes Intravenous  Once 08/26/15 0853 08/26/15 1057   08/25/15 1115  cefoTEtan in Dextrose 5% (CEFOTAN) IVPB 2 g     2 g Intravenous  Once 08/25/15 1111 08/25/15 1150   08/25/15 1113  cefoTEtan in Dextrose 5% (CEFOTAN) 2-2.08 GM-% IVPB    Comments:  [name]   : cabinet override      08/25/15 1113 08/25/15 2329   08/21/15 0400  cefUROXime (ZINACEF) 1.5 g in dextrose 5 % 50 mL IVPB     1.5 g 100 mL/hr over 30 Minutes Intravenous Every 12 hours 08/20/15 1742 08/22/15 1630   08/20/15 2330  vancomycin (VANCOCIN) IVPB 1000 mg/200 mL premix     1,000 mg 200 mL/hr over 60 Minutes Intravenous Every 12 hours 08/20/15 1827 08/21/15 2330   08/20/15 2315  vancomycin (VANCOCIN) IVPB 1000 mg/200 mL premix  Status:  Discontinued     1,000 mg 200 mL/hr over 60 Minutes Intravenous  Once 08/20/15 1742 08/20/15  1827   08/20/15 0400  vancomycin (VANCOCIN) 1,500 mg in sodium chloride 0.9 % 250 mL IVPB  Status:  Discontinued     1,500 mg 125 mL/hr over 120 Minutes Intravenous To Surgery 08/19/15 1605 08/20/15 1523   08/20/15 0400  cefUROXime (ZINACEF) 1.5 g in dextrose 5 % 50 mL IVPB  Status:  Discontinued     1.5 g 100 mL/hr over 30 Minutes Intravenous To Surgery 08/19/15 1605 08/20/15 1523   08/20/15 0400  cefUROXime (ZINACEF) 750 mg in dextrose 5 % 50 mL IVPB  Status:  Discontinued     750 mg 100 mL/hr over 30 Minutes Intravenous To Surgery 08/19/15 1605 08/20/15 1523      Assessment/Plan: s/p Procedure(s): EXPLORATORY LAPAROTOMY  (N/A) ASCENDING AND TRANSVERSE COLECTOMY (N/A) 70 yo M POD 7 s/p exlap and R hemicolectomy for cecal volvulus  -Having some bowel function with passage of flatus.  Ileus appears to be resolving.  Would continue with NG and NPO until bowel movement. -Continue with TNA for PCM. -Encourage ambulation and OOB -OK from our standpoint for transfer to stepdown.  LOS: 13 days    Reino Kent 09/01/2015

## 2015-09-02 ENCOUNTER — Inpatient Hospital Stay (HOSPITAL_COMMUNITY): Payer: Medicare Other

## 2015-09-02 LAB — CBC
HCT: 26.7 % — ABNORMAL LOW (ref 39.0–52.0)
Hemoglobin: 8.7 g/dL — ABNORMAL LOW (ref 13.0–17.0)
MCH: 31 pg (ref 26.0–34.0)
MCHC: 32.6 g/dL (ref 30.0–36.0)
MCV: 95 fL (ref 78.0–100.0)
Platelets: 153 10*3/uL (ref 150–400)
RBC: 2.81 MIL/uL — ABNORMAL LOW (ref 4.22–5.81)
RDW: 15.2 % (ref 11.5–15.5)
WBC: 10.6 10*3/uL — ABNORMAL HIGH (ref 4.0–10.5)

## 2015-09-02 LAB — COMPREHENSIVE METABOLIC PANEL
ALK PHOS: 48 U/L (ref 38–126)
ALT: 33 U/L (ref 17–63)
ANION GAP: 8 (ref 5–15)
AST: 43 U/L — ABNORMAL HIGH (ref 15–41)
Albumin: 2.4 g/dL — ABNORMAL LOW (ref 3.5–5.0)
BILIRUBIN TOTAL: 1.1 mg/dL (ref 0.3–1.2)
BUN: 22 mg/dL — ABNORMAL HIGH (ref 6–20)
CALCIUM: 8.1 mg/dL — AB (ref 8.9–10.3)
CO2: 25 mmol/L (ref 22–32)
Chloride: 98 mmol/L — ABNORMAL LOW (ref 101–111)
Creatinine, Ser: 0.82 mg/dL (ref 0.61–1.24)
Glucose, Bld: 125 mg/dL — ABNORMAL HIGH (ref 65–99)
Potassium: 4 mmol/L (ref 3.5–5.1)
SODIUM: 131 mmol/L — AB (ref 135–145)
TOTAL PROTEIN: 5.7 g/dL — AB (ref 6.5–8.1)

## 2015-09-02 LAB — GLUCOSE, CAPILLARY
GLUCOSE-CAPILLARY: 111 mg/dL — AB (ref 65–99)
GLUCOSE-CAPILLARY: 118 mg/dL — AB (ref 65–99)
GLUCOSE-CAPILLARY: 128 mg/dL — AB (ref 65–99)
GLUCOSE-CAPILLARY: 142 mg/dL — AB (ref 65–99)
Glucose-Capillary: 116 mg/dL — ABNORMAL HIGH (ref 65–99)

## 2015-09-02 LAB — MAGNESIUM: MAGNESIUM: 2.1 mg/dL (ref 1.7–2.4)

## 2015-09-02 LAB — PHOSPHORUS: PHOSPHORUS: 3.5 mg/dL (ref 2.5–4.6)

## 2015-09-02 MED ORDER — TRACE MINERALS CR-CU-MN-SE-ZN 10-1000-500-60 MCG/ML IV SOLN
INTRAVENOUS | Status: AC
  Administered 2015-09-02: 17:00:00 via INTRAVENOUS
  Filled 2015-09-02: qty 2400

## 2015-09-02 MED ORDER — FAT EMULSION 20 % IV EMUL
240.0000 mL | INTRAVENOUS | Status: AC
  Administered 2015-09-02: 240 mL via INTRAVENOUS
  Filled 2015-09-02: qty 250

## 2015-09-02 MED ORDER — POTASSIUM CHLORIDE CRYS ER 20 MEQ PO TBCR: 20.0000 meq | EXTENDED_RELEASE_TABLET | Freq: Once | ORAL | Status: DC

## 2015-09-02 NOTE — Progress Notes (Addendum)
PARENTERAL NUTRITION CONSULT NOTE - FOLLOW UP  Pharmacy Consult for TPN Indication: ileus  Allergies  Allergen Reactions  . Atorvastatin Anaphylaxis    LIPITOR -  facial swelling    Patient Measurements: Height: 5\' 6"  (167.6 cm) Weight: 224 lb 13.9 oz (102 kg) IBW/kg (Calculated) : 63.8 Adjusted Body Weight:  Usual Weight:   Vital Signs: Temp: 98.4 F (36.9 C) (05/18 0324) Temp Source: Oral (05/18 0324) BP: 156/68 mmHg (05/18 0324) Pulse Rate: 111 (05/18 0324) Intake/Output from previous day: 05/17 0701 - 05/18 0700 In: 1350.3 [I.V.:240.3; NG/GT:120; TPN:990] Out: 1710 [Urine:1650; Emesis/NG output:60] Intake/Output from this shift:    Labs:  Recent Labs  08/31/15 0427 09/01/15 0416 09/02/15 0415  WBC 12.5* 11.7* 10.6*  HGB 8.7* 9.0* 8.7*  HCT 26.0* 28.1* 26.7*  PLT 124* 155 153     Recent Labs  09/02/15 0415  NA 131*  K 4.0  CL 98*  CO2 25  GLUCOSE 125*  BUN 22*  CREATININE 0.82  CALCIUM 8.1*  MG 2.1  PHOS 3.5  PROT 5.7*  ALBUMIN 2.4*  AST 43*  ALT 33  ALKPHOS 48  BILITOT 1.1   Estimated Creatinine Clearance: 92.4 mL/min (by C-G formula based on Cr of 0.82).    Recent Labs  09/01/15 1810 09/02/15 0005 09/02/15 0540  GLUCAP 122* 142* 128*    Medications:  Scheduled:  . antiseptic oral rinse  7 mL Mouth Rinse q12n4p  . bisacodyl  10 mg Oral Daily   Or  . bisacodyl  10 mg Rectal Daily  . bisacodyl  10 mg Rectal Once  . budesonide (PULMICORT) nebulizer solution  0.5 mg Nebulization BID  . chlorhexidine  15 mL Mouth Rinse BID  . docusate  200 mg Oral Daily  . enoxaparin (LOVENOX) injection  40 mg Subcutaneous Q24H  . fentaNYL  75 mcg Transdermal Q72H  . furosemide  40 mg Intravenous Daily  . insulin aspart  0-20 Units Subcutaneous Q6H  . insulin detemir  20 Units Subcutaneous BID  . metoCLOPramide (REGLAN) injection  10 mg Intravenous Q6H  . pantoprazole (PROTONIX) IV  40 mg Intravenous Q24H  . potassium chloride  20 mEq Oral  Daily  . sodium chloride flush  10-40 mL Intracatheter Q12H    Insulin Requirements in the past 24 hours:  40 units of Levemir (20 units bid) 9 units of SSI  Assessment: Pt is s/p CABG on 5/5, pt begun complaining of abdominal pain and distention; KUB showed ileus with cecum measuring 15 cm. Surgery was consulted for a possible volvulus. Has a history of ileus that resolved after a couple days s/p prior surgery.  GI: ileus dilated cecum to 15 cm. 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 Albumin low at 2.3. Prealbumin up to 11.2. NG tube (5/14 >> ) had 36ml of ouput yesterday, significantly decreased. Abd X-ray on 5/17 still show SBO. LBM 5/3, (-)flatus.  Awaiting return of bowel function. On PPI IV, Reglan IV Endo: CBGs controlled (122-142) on SSI + Levemir 20 units BID Lytes:  K at 4 (goal > 4) on KCL 25mEq daily. Mg 2.1 (goal > 2) Phos 3.5. CoCa 9.4 Renal: SCr stable, CrCl ~63ml/min. UOP stable at 0.71ml/kg/hr. D5NS @ KVO Pulm: Extubated on 5/11.  RA.  Pulmicort Cards: 5 days out from CABG. BP elevated and HR ok (Afib) Amio gtt at 30mg /hr. Off pressors.  Continues on lasix 40mg  IV qday Hepatobil: LFTs/Tbili wnl, TG wnl Neuro: hx EtOH abuse, taking  beer BID while admitted to prevent withdrawal. On Fent patch for pain control ID: Afebrile, WBC 10.6  Best Practices: Lovenox  TPN Access: PICC placed 5/8 TPN start date: 08/25/15  Nutritional Goals: per RD recs on 5/11 Kcal: 2100-2300 Protein: 115-125 g  Current Nutrition:  NPO Clinimix E 5/15 at 137ml/hr IV lipid emulsions at 46ml/hr  Plan:  Continue Clinimix E 5/15 at 139ml/hr Continue 20% lipid emulsion at 50ml/hr D5NS at 10 ml/hr This provides 120 g of protein and 2184 kCals per day meeting 100% of protein and kcal needs K 54mEq po x 1 for total of 87mEq po today, to meet goal K > 4 in setting of ileus Add MVI and TE in TPN Add thiamine and folic acid to TPN Continue resistant SSI q6h Continue Levemir  20 units BID per MD Monitor TPN labs F/U return of bowel function, advancement of diet  Gracy Bruins, PharmD Clinical Pharmacist [facility]

## 2015-09-02 NOTE — Progress Notes (Signed)
[address]       [place] [address]             [phone]      8 Days Post-Op Procedure(s) (LRB): EXPLORATORY LAPAROTOMY (N/A) ASCENDING AND TRANSVERSE COLECTOMY (N/A) Subjective: Feels fair, says he has had some flatus  Objective: Vital signs in last 24 hours: Temp:  [97.7 F (36.5 C)-98.4 F (36.9 C)] 98.4 F (36.9 C) (05/18 0324) Pulse Rate:  [96-111] 111 (05/18 0324) Cardiac Rhythm:  [-] Normal sinus rhythm (05/18 0150) Resp:  [20-34] 20 (05/18 0324) BP: (128-159)/(57-81) 156/68 mmHg (05/18 0324) SpO2:  [95 %-100 %] 99 % (05/18 0324) Weight:  [224 lb 13.9 oz (102 kg)] 224 lb 13.9 oz (102 kg) (05/18 0324)  Hemodynamic parameters for last 24 hours:    Intake/Output from previous day: 05/17 0701 - 05/18 0700 In: 1350.3 [I.V.:240.3; NG/GT:120; TPN:990] Out: 1710 [Urine:1650; Emesis/NG output:60] Intake/Output this shift:    General appearance: alert, cooperative, fatigued and no distress Heart: regular rate and rhythm Lungs: mildly dim in the bases Abdomen: + distension, mild TTP, scant BS, tympanic Extremities: minor edema Wound: incis healing well(sternotomy/EVH) , abd incis dressed with some sero-sang drainage  Lab Results:  Recent Labs  09/01/15 0416 09/02/15 0415  WBC 11.7* 10.6*  HGB 9.0* 8.7*  HCT 28.1* 26.7*  PLT 155 153   BMET:  Recent Labs  09/02/15 0415  NA 131*  K 4.0  CL 98*  CO2 25  GLUCOSE 125*  BUN 22*  CREATININE 0.82  CALCIUM 8.1*    PT/INR: No results for input(s): LABPROT, INR in the last 72 hours. ABG    Component Value Date/Time   PHART 7.461* 08/27/2015 0605   HCO3 27.0* 08/27/2015 0605   TCO2 28.1 08/27/2015 0605   ACIDBASEDEF 1.0 08/25/2015 1503   O2SAT 98.8 08/27/2015 0605   CBG (last 3)   Recent Labs  09/01/15 1810 09/02/15 0005 09/02/15 0540  GLUCAP 122* 142* 128*    Meds Scheduled Meds: . antiseptic oral rinse  7 mL Mouth Rinse q12n4p  . bisacodyl  10 mg Oral Daily   Or    . bisacodyl  10 mg Rectal Daily  . bisacodyl  10 mg Rectal Once  . budesonide (PULMICORT) nebulizer solution  0.5 mg Nebulization BID  . chlorhexidine  15 mL Mouth Rinse BID  . docusate  200 mg Oral Daily  . enoxaparin (LOVENOX) injection  40 mg Subcutaneous Q24H  . fentaNYL  75 mcg Transdermal Q72H  . furosemide  40 mg Intravenous Daily  . insulin aspart  0-20 Units Subcutaneous Q6H  . insulin detemir  20 Units Subcutaneous BID  . metoCLOPramide (REGLAN) injection  10 mg Intravenous Q6H  . pantoprazole (PROTONIX) IV  40 mg Intravenous Q24H  . potassium chloride  20 mEq Oral Daily  . sodium chloride flush  10-40 mL Intracatheter Q12H   Continuous Infusions: . amiodarone 30 mg/hr (09/02/15 0651)  . dextrose 5 % and 0.9% NaCl 10 mL/hr at 08/31/15 2010  . Marland KitchenTPN (CLINIMIX-E) Adult 100 mL/hr at 09/01/15 1722   And  . fat emulsion 240 mL (09/01/15 1722)  . lactated ringers 10 mL/hr at 08/25/15 1000  . phenylephrine (NEO-SYNEPHRINE) Adult infusion Stopped (08/25/15 1700)   PRN Meds:.fentaNYL, haloperidol lactate, ipratropium-albuterol, LORazepam, metoprolol, ondansetron (ZOFRAN) IV, sodium chloride flush  Xrays Dg Abd Portable 1v  09/01/2015  CLINICAL DATA:  Ileus. EXAM: PORTABLE ABDOMEN - 1 VIEW COMPARISON:  08/31/2015. FINDINGS: Slight  worsening of small bowel distention noted. Small-bowel diameter measures up to 5.3 cm. There is a paucity of intra colonic gas. Findings suggest small bowel obstruction. No free air identified. Surgical staples over the abdomen. Left hip replacement. IMPRESSION: Progressive small bowel dilatation with a paucity of intra colonic gas. Findings suggest small bowel obstruction. Electronically Signed   By: Marcello Moores  Register   On: 09/01/2015 08:08    Assessment/Plan: S/P Procedure(s) (LRB): EXPLORATORY LAPAROTOMY (N/A) ASCENDING AND TRANSVERSE COLECTOMY (N/A)  1 GI issues managed by gen surgery, ileus persists, on TPN /NGT- protein malnutrition 2 on amio gtt  for afib- LFT's ok, monitor 3 no leukocytosis 4 anemia is fairly stable- monitor 5 renal fxn normal- cont gentle diuresis  LOS: 14 days    Javeria Briski E 09/02/2015

## 2015-09-02 NOTE — Progress Notes (Signed)
CARDIAC REHAB PHASE I   PRE:  Rate/Rhythm: 91SR  BP:  Supine: 144/64  Sitting:   Standing:    SaO2: 98%RA  MODE:  Ambulation: 150 ft   POST:  Rate/Rhythm: 113  BP:  Supine:   Sitting: 134/60  Standing:    SaO2: 97%RA hall, 95%RA room [id number] Pt walked 150 ft on RA with gait belt use, rolling walker and asst x 1 and one assist to follow with wheelchair. Pt rocked to stand with assistance. Pt had to sit once to rest. Did not c/o dizziness, was slightly SOB.  To recliner after walk with call bell. NGT intact. Tired by end of walk.   Graylon Good, RN BSN  09/02/2015 11:21 AM

## 2015-09-02 NOTE — Progress Notes (Signed)
8 Days Post-Op  Subjective: Transferred from ICU to floor yesterday. Denies any passage of flatus or BM yesterday.   NG with deceased output over the past 24 hours. Xray yesterday with dilated small bowel and some colonic air.  Objective: Vital signs in last 24 hours: Temp:  [97.7 F (36.5 C)-98.4 F (36.9 C)] 98.4 F (36.9 C) (05/18 0324) Pulse Rate:  [96-111] 111 (05/18 0324) Resp:  [20-34] 20 (05/18 0324) BP: (128-159)/(57-81) 156/68 mmHg (05/18 0324) SpO2:  [95 %-100 %] 99 % (05/18 0324) Weight:  [102 kg (224 lb 13.9 oz)] 102 kg (224 lb 13.9 oz) (05/18 0324) Last BM Date: 08/18/15  Intake/Output from previous day: 05/17 0701 - 05/18 0700 In: 1350.3 [I.V.:240.3; NG/GT:120; TPN:990] Out: 1710 [Urine:1650; Emesis/NG output:60] Intake/Output this shift:    General appearance: alert, cooperative and no distress Resp: Bilater chest wall expansion with nonlabored respirations GI: Soft, nontender.  Distended.  Tympanitic.  NG with ~50cc in canister of bilious fluid.  Incision with dressing in place.  clean dry and intact.  Lab Results:   Recent Labs  09/01/15 0416 09/02/15 0415  WBC 11.7* 10.6*  HGB 9.0* 8.7*  HCT 28.1* 26.7*  PLT 155 153   BMET  Recent Labs  09/02/15 0415  NA 131*  K 4.0  CL 98*  CO2 25  GLUCOSE 125*  BUN 22*  CREATININE 0.82  CALCIUM 8.1*   PT/INR No results for input(s): LABPROT, INR in the last 72 hours. ABG No results for input(s): PHART, HCO3 in the last 72 hours.  Invalid input(s): PCO2, PO2  Studies/Results: Dg Abd Portable 1v  09/01/2015  CLINICAL DATA:  Ileus. EXAM: PORTABLE ABDOMEN - 1 VIEW COMPARISON:  08/31/2015. FINDINGS: Slight worsening of small bowel distention noted. Small-bowel diameter measures up to 5.3 cm. There is a paucity of intra colonic gas. Findings suggest small bowel obstruction. No free air identified. Surgical staples over the abdomen. Left hip replacement. IMPRESSION: Progressive small bowel dilatation with  a paucity of intra colonic gas. Findings suggest small bowel obstruction. Electronically Signed   By: Marcello Moores  Register   On: 09/01/2015 08:08    Anti-infectives: Anti-infectives    Start     Dose/Rate Route Frequency Ordered Stop   08/26/15 1000  cefoTEtan (CEFOTAN) 1 g in dextrose 5 % 50 mL IVPB     1 g 100 mL/hr over 30 Minutes Intravenous  Once 08/26/15 0853 08/26/15 1057   08/25/15 1115  cefoTEtan in Dextrose 5% (CEFOTAN) IVPB 2 g     2 g Intravenous  Once 08/25/15 1111 08/25/15 1150   08/25/15 1113  cefoTEtan in Dextrose 5% (CEFOTAN) 2-2.08 GM-% IVPB    Comments:  [name]   : cabinet override      08/25/15 1113 08/25/15 2329   08/21/15 0400  cefUROXime (ZINACEF) 1.5 g in dextrose 5 % 50 mL IVPB     1.5 g 100 mL/hr over 30 Minutes Intravenous Every 12 hours 08/20/15 1742 08/22/15 1630   08/20/15 2330  vancomycin (VANCOCIN) IVPB 1000 mg/200 mL premix     1,000 mg 200 mL/hr over 60 Minutes Intravenous Every 12 hours 08/20/15 1827 08/21/15 2330   08/20/15 2315  vancomycin (VANCOCIN) IVPB 1000 mg/200 mL premix  Status:  Discontinued     1,000 mg 200 mL/hr over 60 Minutes Intravenous  Once 08/20/15 1742 08/20/15 1827   08/20/15 0400  vancomycin (VANCOCIN) 1,500 mg in sodium chloride 0.9 % 250 mL IVPB  Status:  Discontinued  1,500 mg 125 mL/hr over 120 Minutes Intravenous To Surgery 08/19/15 1605 08/20/15 1523   08/20/15 0400  cefUROXime (ZINACEF) 1.5 g in dextrose 5 % 50 mL IVPB  Status:  Discontinued     1.5 g 100 mL/hr over 30 Minutes Intravenous To Surgery 08/19/15 1605 08/20/15 1523   08/20/15 0400  cefUROXime (ZINACEF) 750 mg in dextrose 5 % 50 mL IVPB  Status:  Discontinued     750 mg 100 mL/hr over 30 Minutes Intravenous To Surgery 08/19/15 1605 08/20/15 1523      Assessment/Plan: s/p Procedure(s): EXPLORATORY LAPAROTOMY (N/A) ASCENDING AND TRANSVERSE COLECTOMY (N/A) POD 8 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  X ray this morning shows evidence of continued ileus with air in dilated loops of SB and air in colon.  NG with decreased output.  Would continue to monitor output and labs.  Continue with ng to wall suction while we await return of bowel function.  Continue TNA for PCM. -Encourage ambulation.  LOS: 14 days    Victor Diaz 09/02/2015

## 2015-09-03 LAB — CBC
HCT: 28 % — ABNORMAL LOW (ref 39.0–52.0)
Hemoglobin: 8.9 g/dL — ABNORMAL LOW (ref 13.0–17.0)
MCH: 30.2 pg (ref 26.0–34.0)
MCHC: 31.8 g/dL (ref 30.0–36.0)
MCV: 94.9 fL (ref 78.0–100.0)
Platelets: 187 10*3/uL (ref 150–400)
RBC: 2.95 MIL/uL — ABNORMAL LOW (ref 4.22–5.81)
RDW: 15.2 % (ref 11.5–15.5)
WBC: 8.5 10*3/uL (ref 4.0–10.5)

## 2015-09-03 LAB — GLUCOSE, CAPILLARY
GLUCOSE-CAPILLARY: 117 mg/dL — AB (ref 65–99)
Glucose-Capillary: 119 mg/dL — ABNORMAL HIGH (ref 65–99)
Glucose-Capillary: 112 mg/dL — ABNORMAL HIGH (ref 65–99)
Glucose-Capillary: 125 mg/dL — ABNORMAL HIGH (ref 65–99)

## 2015-09-03 MED ORDER — FAT EMULSION 20 % IV EMUL
240.0000 mL | INTRAVENOUS | Status: AC
  Administered 2015-09-03: 240 mL via INTRAVENOUS
  Filled 2015-09-03: qty 250

## 2015-09-03 MED ORDER — TRACE MINERALS CR-CU-MN-SE-ZN 10-1000-500-60 MCG/ML IV SOLN
INTRAVENOUS | Status: AC
  Administered 2015-09-03: 18:00:00 via INTRAVENOUS
  Filled 2015-09-03: qty 2400

## 2015-09-03 MED ORDER — INSULIN ASPART 100 UNIT/ML ~~LOC~~ SOLN: 0.0000 [IU] | Freq: Two times a day (BID) | SUBCUTANEOUS | Status: DC

## 2015-09-03 NOTE — Progress Notes (Signed)
Rehab admissions - Patient doing well with mobility ambulating 180 feet min guard assist RW.  Once patient is medically ready for rehab, may not need inpatient rehab stay.  Will continue to follow progress.  Call me for questions.  RC:[phone]

## 2015-09-03 NOTE — Progress Notes (Signed)
Physical Therapy Treatment Patient Details Name: Victor Diaz MRN: [id number] DOB: 03-Sep-1945 Today's Date: 09/03/2015    History of Present Illness pt presents with CABG x4 on 5/6; Rt hemicolectomy 5/10.  pt with hx of heavy Etoh use, L THA, Gout, HTN, and A-Flutter.      PT Comments    Pt admitted with above diagnosis. Pt currently with functional limitations due to balance and endurance deficits. Pt was able to ambulate with RW with 1 seated rest break due to fatigue.  Pt needs cues for postural control for upright stance and to sequence steps and RW.  Progressing well.  Pt will benefit from skilled PT to increase their independence and safety with mobility to allow discharge to the venue listed below.    Follow Up Recommendations  CIR     Equipment Recommendations  None recommended by PT    Recommendations for Other Services       Precautions / Restrictions Precautions Precautions: Fall;Sternal Restrictions Weight Bearing Restrictions: Yes (sternal precautions)    Mobility  Bed Mobility Overal bed mobility: Needs Assistance Bed Mobility: Rolling;Sidelying to Sit Rolling: Min assist Sidelying to sit: Mod assist;+2 for physical assistance;HOB elevated       General bed mobility comments: Pt needed cues for log roll for sternal precautions but pt still had difficulty to complete movement.  Needed assist for LEs.  Transfers Overall transfer level: Needs assistance Equipment used: Rolling walker (2 wheeled) Transfers: Sit to/from Stand Sit to Stand: Min guard         General transfer comment: Held pillow and was able to power up with LEs only.  Did rock for momentum  Ambulation/Gait Ambulation/Gait assistance: Min guard Ambulation Distance (Feet): 180 Feet (120 feet, sitting rest break and then 60 feet) Assistive device: Rolling walker (2 wheeled) Gait Pattern/deviations: Step-through pattern;Decreased stride length;Trunk flexed   Gait velocity  interpretation: Below normal speed for age/gender General Gait Details: pt needs cues for pacing and slowing breathing. Cues also needed to stay close to RW and stand up tall.  Pt anxious about movement.  Followed pt withchair with pt having one seated rest break.    Stairs            Wheelchair Mobility    Modified Rankin (Stroke Patients Only)       Balance Overall balance assessment: Needs assistance Sitting-balance support: No upper extremity supported;Feet supported Sitting balance-Leahy Scale: Fair     Standing balance support: Bilateral upper extremity supported;During functional activity Standing balance-Leahy Scale: Poor Standing balance comment: requires min guard assist for balance with challenges with RW.              High level balance activites: Direction changes;Turns;Sudden stops High Level Balance Comments: Needs min guard assist with above.     Cognition Arousal/Alertness: Awake/alert Behavior During Therapy: WFL for tasks assessed/performed Overall Cognitive Status: Within Functional Limits for tasks assessed                      Exercises General Exercises - Lower Extremity Ankle Circles/Pumps: AROM;Both;10 reps;Seated Long Arc Quad: AROM;Both;10 reps;Seated    General Comments        Pertinent Vitals/Pain Pain Assessment: Faces Faces Pain Scale: Hurts little more Pain Location: abdomen Pain Descriptors / Indicators: Operative site guarding Pain Intervention(s): Limited activity within patient's tolerance;Monitored during session;Repositioned  VSS    Home Living  Prior Function            PT Goals (current goals can now be found in the care plan section) Progress towards PT goals: Progressing toward goals    Frequency  Min 3X/week    PT Plan Current plan remains appropriate    Co-evaluation             End of Session Equipment Utilized During Treatment: Gait belt Activity  Tolerance: Patient limited by fatigue Patient left: in chair;with call bell/phone within reach;with chair alarm set     Time: [id number] PT Time Calculation (min) (ACUTE ONLY): 35 min  Charges:  $Gait Training: 8-22 mins $Therapeutic Exercise: 8-22 mins                    G CodesIrwin Brakeman F 02-Oct-2015, 12:46 PM [facility] [facility] [phone] (pager)

## 2015-09-03 NOTE — Progress Notes (Addendum)
PARENTERAL NUTRITION CONSULT NOTE - FOLLOW UP  Pharmacy Consult for TPN Indication: ileus  Allergies  Allergen Reactions  . Atorvastatin Anaphylaxis    LIPITOR -  facial swelling    Patient Measurements: Height: 5\' 6"  (167.6 cm) Weight: 218 lb 8 oz (99.111 kg) IBW/kg (Calculated) : 63.8 Adjusted Body Weight:  Usual Weight:   Vital Signs: Temp: 98 F (36.7 C) (05/19 0458) Temp Source: Oral (05/19 0458) BP: 159/73 mmHg (05/19 0458) Pulse Rate: 102 (05/19 0458) Intake/Output from previous day: 05/18 0701 - 05/19 0700 In: -  Out: 1390 [Urine:1200; Emesis/NG output:190] Intake/Output from this shift: Total I/O In: -  Out: 150 [Emesis/NG output:150]  Labs:  Recent Labs  09/01/15 0416 09/02/15 0415 09/03/15 0455  WBC 11.7* 10.6* 8.5  HGB 9.0* 8.7* 8.9*  HCT 28.1* 26.7* 28.0*  PLT 155 153 187     Recent Labs  09/02/15 0415  NA 131*  K 4.0  CL 98*  CO2 25  GLUCOSE 125*  BUN 22*  CREATININE 0.82  CALCIUM 8.1*  MG 2.1  PHOS 3.5  PROT 5.7*  ALBUMIN 2.4*  AST 43*  ALT 33  ALKPHOS 48  BILITOT 1.1   Estimated Creatinine Clearance: 91 mL/min (by C-G formula based on Cr of 0.82).    Recent Labs  09/02/15 1620 09/02/15 2247 09/03/15 0447  GLUCAP 111* 116* 119*    Medications:  Scheduled:  . antiseptic oral rinse  7 mL Mouth Rinse q12n4p  . bisacodyl  10 mg Oral Daily   Or  . bisacodyl  10 mg Rectal Daily  . bisacodyl  10 mg Rectal Once  . budesonide (PULMICORT) nebulizer solution  0.5 mg Nebulization BID  . chlorhexidine  15 mL Mouth Rinse BID  . docusate  200 mg Oral Daily  . enoxaparin (LOVENOX) injection  40 mg Subcutaneous Q24H  . fentaNYL  75 mcg Transdermal Q72H  . furosemide  40 mg Intravenous Daily  . insulin aspart  0-20 Units Subcutaneous Q6H  . insulin detemir  20 Units Subcutaneous BID  . metoCLOPramide (REGLAN) injection  10 mg Intravenous Q6H  . pantoprazole (PROTONIX) IV  40 mg Intravenous Q24H  . potassium chloride  20 mEq  Oral Daily  . potassium chloride  20 mEq Oral Once  . 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 Surgery was consulted for a possible volvulus. Has a history of ileus that resolved after a couple days s/p prior surgery.  GI: ileus dilated cecum to 15 cm. 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 Albumin low at 2.3. Prealbumin up to 11.2. NG tube (5/14 >> ) had 144ml of ouput yesterday. Abd X-ray on 5/18- persistent ileus. LBM 5/3, (+)flatus.  Abd distended, tympanic. Awaiting return of bowel function. On PPI IV, Reglan IV q6, Docusate 200mg  per tube qday Endo:  No hx DM.  CBGs controlled (111-119) on SSI + Levemir 20 units BID with PM dose held 5/18.  CBGs remain controlled this AM. Lytes: 5/18- K at 4 (goal > 4) on KCL 90mEq daily. Mg 2.1 (goal > 2) Phos 3.5. CoCa  9.4.  Pt refused K supp on 5/18.  No labs today. Renal: SCr stable, CrCl ~85ml/min. UOP stable at 0.15ml/kg/hr. D5NS @ KVO Pulm: Extubated on 5/11. RA. Pulmicort Cards: S/P CABG 08/20/15. BP elevated and HR ok (Afib) Amio gtt at 30mg /hr. Off pressors. Continues on lasix 40mg  IV qday Hepatobil: LFTs/Tbili wnl, TG wnl Neuro: hx EtOH abuse, taking beer BID while admitted to prevent withdrawal. On Fent patch for pain control ID: Afebrile, WBC wnl, no antibiotics  Best Practices: Lovenox  TPN Access: PICC placed 5/8 TPN start date: 08/25/15  Nutritional Goals: per RD recs on 5/11 Kcal: 2100-2300 Protein: 115-125 g  Current Nutrition:  NPO Clinimix E 5/15 at 110ml/hr +  lipid emulsions at 15ml/hr, providing 120 g of protein and 2184 kCals per day and  meeting 100% of nutritional goals  Plan:  Continue Clinimix E 5/15 at 150ml/hr and 20% lipid emulsion at 40ml/hr Add MVI and TE in TPN Add thiamine and folic acid to TPN Change resistant SSI to q12.  Consider d/c as cbgs well controlled. Continue Levemir 20 units BID per MD BMet, Mg in AM TPN labs qMon/Thurs F/U return of bowel function, advancement of diet   Gracy Bruins, PharmD [facility]

## 2015-09-03 NOTE — Progress Notes (Signed)
[facility] Progress Note  9 Days Post-Op  Subjective: Pt doing okay, feels distended, hasn't asked for pain meds.  No N/V, passing some flatus, no BM.  Just got a suppository.    Objective: Vital signs in last 24 hours: Temp:  [97.6 F (36.4 C)-98 F (36.7 C)] 98 F (36.7 C) (05/19 0458) Pulse Rate:  [89-102] 94 (05/19 0855) Resp:  [16-20] 20 (05/19 0458) BP: (120-159)/(61-97) 150/66 mmHg (05/19 0855) SpO2:  [96 %-98 %] 98 % (05/19 0834) Weight:  [218 lb 8 oz (99.111 kg)] 218 lb 8 oz (99.111 kg) (05/19 0458) Last BM Date:  (prior to surgery)  Intake/Output from previous day: 05/18 0701 - 05/19 0700 In: -  Out: 1390 [Urine:1200; Emesis/NG output:190] Intake/Output this shift: Total I/O In: -  Out: 150 [Emesis/NG output:150]  PE: Gen:  Alert, NAD, pleasant Abd: Soft, greatly distended, NT, +BS, no HSM, incisions with loose staples, clear yellow drainage from wound, in the midline wound about the umbilicus is erythematous.  The entire abdomen it tight which makes it hard to determine if there is fluctuance/induration.  Lab Results:   Recent Labs  09/02/15 0415 09/03/15 0455  WBC 10.6* 8.5  HGB 8.7* 8.9*  HCT 26.7* 28.0*  PLT 153 187   BMET  Recent Labs  09/02/15 0415  NA 131*  K 4.0  CL 98*  CO2 25  GLUCOSE 125*  BUN 22*  CREATININE 0.82  CALCIUM 8.1*   PT/INR No results for input(s): LABPROT, INR in the last 72 hours. CMP     Component Value Date/Time   NA 131* 09/02/2015 0415   NA 132* 01/27/2012 1332   K 4.0 09/02/2015 0415   K 4.5 01/27/2012 1332   CL 98* 09/02/2015 0415   CL 98 01/27/2012 1332   CO2 25 09/02/2015 0415   CO2 24 01/27/2012 1332   GLUCOSE 125* 09/02/2015 0415   GLUCOSE 113* 01/27/2012 1332   BUN 22* 09/02/2015 0415   BUN 12 01/27/2012 1332   CREATININE 0.82 09/02/2015 0415   CREATININE 1.11 01/27/2012 1332   CALCIUM 8.1* 09/02/2015 0415   CALCIUM 9.5 01/27/2012 1332   PROT 5.7* 09/02/2015 0415   ALBUMIN 2.4*  09/02/2015 0415   AST 43* 09/02/2015 0415   ALT 33 09/02/2015 0415   ALKPHOS 48 09/02/2015 0415   BILITOT 1.1 09/02/2015 0415   GFRNONAA >60 09/02/2015 0415   GFRNONAA >60 01/27/2012 1332   GFRAA >60 09/02/2015 0415   GFRAA >60 01/27/2012 1332   Lipase  No results found for: LIPASE     Studies/Results: Dg Abd Portable 1v  09/02/2015  CLINICAL DATA:  Ileus, abdominal distension EXAM: PORTABLE ABDOMEN - 1 VIEW COMPARISON:  09/01/2015 FINDINGS: Persistent gaseous distended small bowel loops suspicious for ileus or partial bowel obstruction. Moderate gas noted within transverse colon. Midline abdominal skin staples. NG tube with tip within stomach. IMPRESSION: Persistent gaseous distended small bowel loops suspicious for ileus or partial bowel obstruction. Moderate gas noted within transverse colon. Electronically Signed   By: Lahoma Crocker M.D.   On: 09/02/2015 08:03    Anti-infectives: Anti-infectives    Start     Dose/Rate Route Frequency Ordered Stop   08/26/15 1000  cefoTEtan (CEFOTAN) 1 g in dextrose 5 % 50 mL IVPB     1 g 100 mL/hr over 30 Minutes Intravenous  Once 08/26/15 0853 08/26/15 1057   08/25/15 1115  cefoTEtan in Dextrose 5% (CEFOTAN) IVPB 2 g     2 g  Intravenous  Once 08/25/15 1111 08/25/15 1150   08/25/15 1113  cefoTEtan in Dextrose 5% (CEFOTAN) 2-2.08 GM-% IVPB    Comments:  [name]   : cabinet override      08/25/15 1113 08/25/15 2329   08/21/15 0400  cefUROXime (ZINACEF) 1.5 g in dextrose 5 % 50 mL IVPB     1.5 g 100 mL/hr over 30 Minutes Intravenous Every 12 hours 08/20/15 1742 08/22/15 1630   08/20/15 2330  vancomycin (VANCOCIN) IVPB 1000 mg/200 mL premix     1,000 mg 200 mL/hr over 60 Minutes Intravenous Every 12 hours 08/20/15 1827 08/21/15 2330   08/20/15 2315  vancomycin (VANCOCIN) IVPB 1000 mg/200 mL premix  Status:  Discontinued     1,000 mg 200 mL/hr over 60 Minutes Intravenous  Once 08/20/15 1742 08/20/15 1827   08/20/15 0400  vancomycin  (VANCOCIN) 1,500 mg in sodium chloride 0.9 % 250 mL IVPB  Status:  Discontinued     1,500 mg 125 mL/hr over 120 Minutes Intravenous To Surgery 08/19/15 1605 08/20/15 1523   08/20/15 0400  cefUROXime (ZINACEF) 1.5 g in dextrose 5 % 50 mL IVPB  Status:  Discontinued     1.5 g 100 mL/hr over 30 Minutes Intravenous To Surgery 08/19/15 1605 08/20/15 1523   08/20/15 0400  cefUROXime (ZINACEF) 750 mg in dextrose 5 % 50 mL IVPB  Status:  Discontinued     750 mg 100 mL/hr over 30 Minutes Intravenous To Surgery 08/19/15 1605 08/20/15 1523       Assessment/Plan S/p CABG Cecal volvulus POD #9 s/p exlap and R hemicolectomy with post operative ileus  -WBC normal today.  Xray yesterday showed continued ileus. -NG with decreased output, had BM, so I considered clamping the tube, but he is still greatly distended. Would continue to monitor output and labs. Continue with NG to wall suction while we await return of bowel function.  -Continue TNA for PCM. -Encourage ambulation. -Dulcolax suppository  Abdominal wall cellulitis -Staples may need to come out, will discuss with Dr. Grandville Silos -Consider starting antibiotics, but WBC normal, afebrile    LOS: 15 days    Nat Christen 09/03/2015, 12:42 PM Pager: [phone]  (7am - 4:30pm M-F; 7am - 11:30am Sa/Su)

## 2015-09-03 NOTE — Progress Notes (Signed)
EPWs removed per order and unit protocol.  All tips intact, Pt tolerated very well.   Nothing remarkable at sites.  Pt understands bedrest for one hr, with frequent vital checks.  VSS, see flowsheet.  CCMD aware, will monitor closely.

## 2015-09-03 NOTE — Progress Notes (Addendum)
      [address]       [place] [address]             [phone]      9 Days Post-Op Procedure(s) (LRB): EXPLORATORY LAPAROTOMY (N/A) ASCENDING AND TRANSVERSE COLECTOMY (N/A)   Subjective:  Victor Diaz states he is doing okay.  He denies nausea, vomiting, and abdominal pain.  He is passing flatus at times now, but has not yet moved his bowels.  Objective: Vital signs in last 24 hours: Temp:  [97.6 F (36.4 C)-98 F (36.7 C)] 98 F (36.7 C) (05/19 0458) Pulse Rate:  [89-102] 102 (05/19 0458) Cardiac Rhythm:  [-] Normal sinus rhythm (05/18 2013) Resp:  [16-20] 20 (05/19 0458) BP: (120-159)/(61-97) 159/73 mmHg (05/19 0458) SpO2:  [94 %-98 %] 96 % (05/19 0458) Weight:  [218 lb 8 oz (99.111 kg)] 218 lb 8 oz (99.111 kg) (05/19 0458)  Intake/Output from previous day: 05/18 0701 - 05/19 0700 In: -  Out: 1390 [Urine:1200; Emesis/NG output:190] Intake/Output this shift: Total I/O In: -  Out: 150 [Emesis/NG output:150]  General appearance: alert, cooperative and no distress Heart: regular rate and rhythm Lungs: clear to auscultation bilaterally Abdomen: distended, + hypoactive BS Extremities: edema trace Wound: Sternotomy is C/D/I, EVH site clean and dry, abdominal wound with bloody serous drainage, staples remain in place no obvious signs of infection  Lab Results:  Recent Labs  09/02/15 0415 09/03/15 0455  WBC 10.6* 8.5  HGB 8.7* 8.9*  HCT 26.7* 28.0*  PLT 153 187   BMET:  Recent Labs  09/02/15 0415  NA 131*  K 4.0  CL 98*  CO2 25  GLUCOSE 125*  BUN 22*  CREATININE 0.82  CALCIUM 8.1*    PT/INR: No results for input(s): LABPROT, INR in the last 72 hours. ABG    Component Value Date/Time   PHART 7.461* 08/27/2015 0605   HCO3 27.0* 08/27/2015 0605   TCO2 28.1 08/27/2015 0605   ACIDBASEDEF 1.0 08/25/2015 1503   O2SAT 98.8 08/27/2015 0605   CBG (last 3)   Recent Labs  09/02/15 1620 09/02/15 2247 09/03/15 0447  GLUCAP 111* 116*  119*    Assessment/Plan: S/P Procedure(s) (LRB): EXPLORATORY LAPAROTOMY (N/A) ASCENDING AND TRANSVERSE COLECTOMY (N/A)  1. CV- Previous A. Fib, currently maintaining NSR- remains hypertensive, can add prn IV hydralazine as needed, continue IV Lopressor, Amiodarone until patient able to take pills 2. Pulm- no acute issues, on RA, continue IS 3. Renal- creatinine has been stable 4. GI- S/P Ex Lap, NG tube remains in place 190 cc output yesterday, Ileus improving starting to pass gas/+ BS, continue tube feeds... Care per surgery 5. Dispo- patient stable, will d/c EPW, continue bowel care per surgery   LOS: 15 days    Victor Diaz, Victor Diaz 09/03/2015  Cardiac status, surgical incisions, remained stable Persistent ileus after urgent right colectomy for cecal volvulus History of previous atrial fibrillation and alcohol abuse Not Coumadin candidate patient examined and medical record reviewed,agree with above note. Tharon Aquas Trigt III 09/03/2015

## 2015-09-03 NOTE — Progress Notes (Signed)
CARDIAC REHAB PHASE I   PRE:  Rate/Rhythm: 97 SR  BP:  Supine:   Sitting: 140/67  Standing:    SaO2: 94%RA  MODE:  Ambulation: 150 ft   POST:  Rate/Rhythm: 112 ST  BP:  Supine:   Sitting: 142/60  Standing:    SaO2: 100%RA 1455-1524 Pt motivated to walk. Incontinent of stool upon standing. Cleaned pt and walked 150 ft on RA with rollator, gait belt and asst x 2. Pt did not need to sit to rest. Requested to go to bed. NGT intact and back to suction after walk. Keep as asst x 2. Seemed a little stronger today than yesterday.   Graylon Good, RN BSN  09/03/2015 3:21 PM

## 2015-09-03 NOTE — Care Management Important Message (Signed)
Important Message  Patient Details  Name: QUANDRE WELSHANS MRN: [id number] Date of Birth: [birth year]   Medicare Important Message Given:  Yes    Dawayne Patricia, RN 09/03/2015, 11:27 AM

## 2015-09-04 LAB — GLUCOSE, CAPILLARY
GLUCOSE-CAPILLARY: 116 mg/dL — AB (ref 65–99)
GLUCOSE-CAPILLARY: 120 mg/dL — AB (ref 65–99)
Glucose-Capillary: 112 mg/dL — ABNORMAL HIGH (ref 65–99)
Glucose-Capillary: 122 mg/dL — ABNORMAL HIGH (ref 65–99)
Glucose-Capillary: 126 mg/dL — ABNORMAL HIGH (ref 65–99)

## 2015-09-04 LAB — BASIC METABOLIC PANEL
Anion gap: 9 (ref 5–15)
BUN: 22 mg/dL — ABNORMAL HIGH (ref 6–20)
CO2: 24 mmol/L (ref 22–32)
Calcium: 8 mg/dL — ABNORMAL LOW (ref 8.9–10.3)
Chloride: 98 mmol/L — ABNORMAL LOW (ref 101–111)
Creatinine, Ser: 0.88 mg/dL (ref 0.61–1.24)
GFR calc Af Amer: >60 mL/min (ref >60)
GFR calc non Af Amer: >60 mL/min (ref >60)
Glucose, Bld: 119 mg/dL — ABNORMAL HIGH (ref 65–99)
Potassium: 4.1 mmol/L (ref 3.5–5.1)
Sodium: 131 mmol/L — ABNORMAL LOW (ref 135–145)

## 2015-09-04 LAB — CBC
HCT: 25.8 % — ABNORMAL LOW (ref 39.0–52.0)
Hemoglobin: 8.4 g/dL — ABNORMAL LOW (ref 13.0–17.0)
MCH: 30.8 pg (ref 26.0–34.0)
MCHC: 32.6 g/dL (ref 30.0–36.0)
MCV: 94.5 fL (ref 78.0–100.0)
Platelets: 199 10*3/uL (ref 150–400)
RBC: 2.73 MIL/uL — ABNORMAL LOW (ref 4.22–5.81)
RDW: 15.2 % (ref 11.5–15.5)
WBC: 7.6 10*3/uL (ref 4.0–10.5)

## 2015-09-04 LAB — MAGNESIUM: Magnesium: 2.3 mg/dL (ref 1.7–2.4)

## 2015-09-04 MED ORDER — TRACE MINERALS CR-CU-MN-SE-ZN 10-1000-500-60 MCG/ML IV SOLN
INTRAVENOUS | Status: AC
  Administered 2015-09-04: 17:00:00 via INTRAVENOUS
  Filled 2015-09-04: qty 2400

## 2015-09-04 MED ORDER — FAT EMULSION 20 % IV EMUL
240.0000 mL | INTRAVENOUS | Status: AC
  Administered 2015-09-04: 240 mL via INTRAVENOUS
  Filled 2015-09-04: qty 250

## 2015-09-04 NOTE — Progress Notes (Signed)
PARENTERAL NUTRITION CONSULT NOTE - FOLLOW UP  Pharmacy Consult for TPN Indication: ileus  Allergies  Allergen Reactions  . Atorvastatin Anaphylaxis    LIPITOR -  facial swelling    Patient Measurements: Height: 5\' 6"  (167.6 cm) Weight: 217 lb 1.6 oz (98.476 kg) IBW/kg (Calculated) : 63.8 Adjusted Body Weight:  Usual Weight:   Vital Signs: Temp: 98.1 F (36.7 C) (05/20 0550) Temp Source: Oral (05/20 0550) BP: 145/60 mmHg (05/20 0550) Pulse Rate: 94 (05/20 0550) Intake/Output from previous day: 05/19 0701 - 05/20 0700 In: 0  Out: 2260 [Urine:1950; Emesis/NG output:310] Intake/Output from this shift: Total I/O In: -  Out: 450 [Urine:450]  Labs:  Recent Labs  09/02/15 0415 09/03/15 0455 09/04/15 0425  WBC 10.6* 8.5 7.6  HGB 8.7* 8.9* 8.4*  HCT 26.7* 28.0* 25.8*  PLT 153 187 199     Recent Labs  09/02/15 0415 09/04/15 0425  NA 131* 131*  K 4.0 4.1  CL 98* 98*  CO2 25 24  GLUCOSE 125* 119*  BUN 22* 22*  CREATININE 0.82 0.88  CALCIUM 8.1* 8.0*  MG 2.1 2.3  PHOS 3.5  --   PROT 5.7*  --   ALBUMIN 2.4*  --   AST 43*  --   ALT 33  --   ALKPHOS 48  --   BILITOT 1.1  --    Estimated Creatinine Clearance: 84.6 mL/min (by C-G formula based on Cr of 0.88).    Recent Labs  09/03/15 2108 09/04/15 0009 09/04/15 0601  GLUCAP 117* 120* 126*    Medications:  Scheduled:  . antiseptic oral rinse  7 mL Mouth Rinse q12n4p  . bisacodyl  10 mg Oral Daily   Or  . bisacodyl  10 mg Rectal Daily  . bisacodyl  10 mg Rectal Once  . budesonide (PULMICORT) nebulizer solution  0.5 mg Nebulization BID  . chlorhexidine  15 mL Mouth Rinse BID  . docusate  200 mg Oral Daily  . enoxaparin (LOVENOX) injection  40 mg Subcutaneous Q24H  . fentaNYL  75 mcg Transdermal Q72H  . furosemide  40 mg Intravenous Daily  . insulin aspart  0-20 Units Subcutaneous Q12H  . insulin detemir  20 Units Subcutaneous BID  . metoCLOPramide (REGLAN) injection  10 mg Intravenous Q6H  .  pantoprazole (PROTONIX) IV  40 mg Intravenous Q24H  . potassium chloride  20 mEq Oral Daily  . potassium chloride  20 mEq Oral Once  . 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 Surgery was consulted for a possible volvulus. Has a history of ileus that resolved after a couple days s/p prior surgery.  GI: ileus dilated cecum to 15 cm. 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 Albumin low at 2.3. Prealbumin up to 11.2. NG tube (5/14 >> ) 357mL. Abd X-ray on 5/18- persistent ileus.  BM x 2 but abd still distended- to remove NG once less distended. Awaiting return of bowel function. On PPI IV, Reglan IV q6 thru tomorrow, Docusate 200mg  per tube qday Endo: No hx DM. CBGs controlled (112-126) on SSI + Levemir 20 units  BID.  Lytes: K at 4.1 (goal > 4) on KCL 47mEq daily. Mg 2.3 (goal > 2). Renal: SCr stable, CrCl ~54ml/min. UOP stable at 0.31ml/kg/hr. D5NS @ KVO Pulm: Extubated on 5/11. RA. Pulmicort Cards: S/P CABG 08/20/15. BP elevated and HR ok (Afib) Amio gtt at 30mg /hr. Off pressors. Continues on lasix 40mg  IV qday Hepatobil: LFTs/Tbili wnl, TG wnl Neuro: hx EtOH abuse, taking beer BID while admitted to prevent withdrawal. On Fent patch for pain control ID: Afebrile, WBC wnl, no antibiotics  Best Practices: Lovenox  TPN Access: PICC placed 5/8 TPN start date: 08/25/15  Nutritional Goals: per RD recs on 5/11 Kcal: 2100-2300 Protein: 115-125 g  Current Nutrition:  NPO Clinimix E 5/15 at 182ml/hr + lipid emulsions at 11ml/hr, providing 120 g of protein and 2184 kCals per day and meeting 100% of nutritional  goals  Plan:  Continue Clinimix E 5/15 at 138ml/hr and 20% lipid emulsion at 48ml/hr Add MVI and TE in TPN Add thiamine and folic acid to TPN D/C SSI BMet in AM Continue Levemir 20 units BID per MD TPN labs qMon/Thurs F/U return of bowel function, advancement of diet   Gracy Bruins, PharmD Clinical Pharmacist [facility]

## 2015-09-04 NOTE — Progress Notes (Addendum)
[address]       [employer] [address]             [phone]      10 Days Post-Op Procedure(s) (LRB): EXPLORATORY LAPAROTOMY (N/A) ASCENDING AND TRANSVERSE COLECTOMY (N/A) Subjective: Feeling better, + BM,+flatus. Intermit afib- current sinus  Objective: Vital signs in last 24 hours: Temp:  [98.1 F (36.7 C)-98.2 F (36.8 C)] 98.1 F (36.7 C) (05/20 0550) Pulse Rate:  [93-95] 94 (05/20 0550) Cardiac Rhythm:  [-] Normal sinus rhythm (05/19 1900) Resp:  [15-20] 20 (05/20 0550) BP: (130-150)/(60-69) 145/60 mmHg (05/20 0550) SpO2:  [95 %-100 %] 95 % (05/20 0747) Weight:  [217 lb 1.6 oz (98.476 kg)] 217 lb 1.6 oz (98.476 kg) (05/20 0550)  Hemodynamic parameters for last 24 hours:    Intake/Output from previous day: 05/19 0701 - 05/20 0700 In: 0  Out: 2260 [Urine:1950; Emesis/NG output:310] Intake/Output this shift: Total I/O In: -  Out: 275 [Urine:275]  General appearance: alert, cooperative and no distress Heart: regular rate and rhythm and soft syst murmur Lungs: min dim in bases Abdomen: + distension, hyperactive BS,min tenderness Extremities: min edema Wound: sternal/evh sites healing well, some seropurulent drainage from abd incis  Lab Results:  Recent Labs  09/03/15 0455 09/04/15 0425  WBC 8.5 7.6  HGB 8.9* 8.4*  HCT 28.0* 25.8*  PLT 187 199   BMET:  Recent Labs  09/02/15 0415 09/04/15 0425  NA 131* 131*  K 4.0 4.1  CL 98* 98*  CO2 25 24  GLUCOSE 125* 119*  BUN 22* 22*  CREATININE 0.82 0.88  CALCIUM 8.1* 8.0*    PT/INR: No results for input(s): LABPROT, INR in the last 72 hours. ABG    Component Value Date/Time   PHART 7.461* 08/27/2015 0605   HCO3 27.0* 08/27/2015 0605   TCO2 28.1 08/27/2015 0605   ACIDBASEDEF 1.0 08/25/2015 1503   O2SAT 98.8 08/27/2015 0605   CBG (last 3)   Recent Labs  09/03/15 2108 09/04/15 0009 09/04/15 0601  GLUCAP 117* 120* 126*    Meds Scheduled Meds: . antiseptic oral rinse   7 mL Mouth Rinse q12n4p  . bisacodyl  10 mg Oral Daily   Or  . bisacodyl  10 mg Rectal Daily  . bisacodyl  10 mg Rectal Once  . budesonide (PULMICORT) nebulizer solution  0.5 mg Nebulization BID  . chlorhexidine  15 mL Mouth Rinse BID  . docusate  200 mg Oral Daily  . enoxaparin (LOVENOX) injection  40 mg Subcutaneous Q24H  . fentaNYL  75 mcg Transdermal Q72H  . furosemide  40 mg Intravenous Daily  . insulin aspart  0-20 Units Subcutaneous Q12H  . insulin detemir  20 Units Subcutaneous BID  . metoCLOPramide (REGLAN) injection  10 mg Intravenous Q6H  . pantoprazole (PROTONIX) IV  40 mg Intravenous Q24H  . potassium chloride  20 mEq Oral Daily  . potassium chloride  20 mEq Oral Once  . sodium chloride flush  10-40 mL Intracatheter Q12H   Continuous Infusions: . amiodarone 30 mg/hr (09/04/15 0550)  . dextrose 5 % and 0.9% NaCl 10 mL/hr at 08/31/15 2010  . Marland KitchenTPN (CLINIMIX-E) Adult 100 mL/hr at 09/03/15 1810   And  . fat emulsion 240 mL (09/03/15 1810)  . lactated ringers 10 mL/hr at 08/25/15 1000  . phenylephrine (NEO-SYNEPHRINE) Adult infusion Stopped (08/25/15 1700)   PRN Meds:.fentaNYL, haloperidol lactate, ipratropium-albuterol, LORazepam, metoprolol, ondansetron (ZOFRAN) IV, sodium chloride flush  Xrays No  results found.  Assessment/Plan: S/P Procedure(s) (LRB): EXPLORATORY LAPAROTOMY (N/A) ASCENDING AND TRANSVERSE COLECTOMY (N/A)  1 steady clinical progress 2 no leukocytosis, H/H decreased a little from prev. - no transfusion required 3 cont amio gtt for afin 4 TPN/NGT as per gen surgery 5 abdominal wound per gen surgery- may have some fatty necrosis vs superficial infection 6 bp reasonably controlled- will need oral rx when taking po 7 sugars adeq controlled   LOS: 16 days    GOLD,WAYNE E 09/04/2015   Chart reviewed, patient examined, agree with above. Bowels functioning. NG clamped per GS today.

## 2015-09-04 NOTE — Progress Notes (Signed)
Progress Note: General Surgery Service   Subjective: Bowel movements yesterday, no nausea  Objective: Vital signs in last 24 hours: Temp:  [98.1 F (36.7 C)-98.2 F (36.8 C)] 98.1 F (36.7 C) (05/20 0550) Pulse Rate:  [93-95] 94 (05/20 0550) Resp:  [15-20] 20 (05/20 0550) BP: (130-145)/(60-69) 145/60 mmHg (05/20 0550) SpO2:  [95 %-100 %] 95 % (05/20 0747) Weight:  [98.476 kg (217 lb 1.6 oz)] 98.476 kg (217 lb 1.6 oz) (05/20 0550) Last BM Date: 09/03/15  Intake/Output from previous day: 05/19 0701 - 05/20 0700 In: 0  Out: 2260 [Urine:1950; Emesis/NG output:310] Intake/Output this shift: Total I/O In: -  Out: 450 [Urine:450]  Lungs: CTAB  Cardiovascular: RRR  Abd: soft, mod distension, minimal ng drainage  Extremities: no edema  Neuro: AOx4  Lab Results: CBC   Recent Labs  09/03/15 0455 09/04/15 0425  WBC 8.5 7.6  HGB 8.9* 8.4*  HCT 28.0* 25.8*  PLT 187 199   BMET  Recent Labs  09/02/15 0415 09/04/15 0425  NA 131* 131*  K 4.0 4.1  CL 98* 98*  CO2 25 24  GLUCOSE 125* 119*  BUN 22* 22*  CREATININE 0.82 0.88  CALCIUM 8.1* 8.0*   PT/INR No results for input(s): LABPROT, INR in the last 72 hours. ABG No results for input(s): PHART, HCO3 in the last 72 hours.  Invalid input(s): PCO2, PO2  Studies/Results:  Anti-infectives: Anti-infectives    Start     Dose/Rate Route Frequency Ordered Stop   08/26/15 1000  cefoTEtan (CEFOTAN) 1 g in dextrose 5 % 50 mL IVPB     1 g 100 mL/hr over 30 Minutes Intravenous  Once 08/26/15 0853 08/26/15 1057   08/25/15 1115  cefoTEtan in Dextrose 5% (CEFOTAN) IVPB 2 g     2 g Intravenous  Once 08/25/15 1111 08/25/15 1150   08/25/15 1113  cefoTEtan in Dextrose 5% (CEFOTAN) 2-2.08 GM-% IVPB    Comments:  [name]   : cabinet override      08/25/15 1113 08/25/15 2329   08/21/15 0400  cefUROXime (ZINACEF) 1.5 g in dextrose 5 % 50 mL IVPB     1.5 g 100 mL/hr over 30 Minutes Intravenous Every 12 hours 08/20/15  1742 08/22/15 1630   08/20/15 2330  vancomycin (VANCOCIN) IVPB 1000 mg/200 mL premix     1,000 mg 200 mL/hr over 60 Minutes Intravenous Every 12 hours 08/20/15 1827 08/21/15 2330   08/20/15 2315  vancomycin (VANCOCIN) IVPB 1000 mg/200 mL premix  Status:  Discontinued     1,000 mg 200 mL/hr over 60 Minutes Intravenous  Once 08/20/15 1742 08/20/15 1827   08/20/15 0400  vancomycin (VANCOCIN) 1,500 mg in sodium chloride 0.9 % 250 mL IVPB  Status:  Discontinued     1,500 mg 125 mL/hr over 120 Minutes Intravenous To Surgery 08/19/15 1605 08/20/15 1523   08/20/15 0400  cefUROXime (ZINACEF) 1.5 g in dextrose 5 % 50 mL IVPB  Status:  Discontinued     1.5 g 100 mL/hr over 30 Minutes Intravenous To Surgery 08/19/15 1605 08/20/15 1523   08/20/15 0400  cefUROXime (ZINACEF) 750 mg in dextrose 5 % 50 mL IVPB  Status:  Discontinued     750 mg 100 mL/hr over 30 Minutes Intravenous To Surgery 08/19/15 1605 08/20/15 1523      Medications: Scheduled Meds: . antiseptic oral rinse  7 mL Mouth Rinse q12n4p  . bisacodyl  10 mg Oral Daily   Or  . bisacodyl  10 mg  Rectal Daily  . bisacodyl  10 mg Rectal Once  . budesonide (PULMICORT) nebulizer solution  0.5 mg Nebulization BID  . chlorhexidine  15 mL Mouth Rinse BID  . docusate  200 mg Oral Daily  . enoxaparin (LOVENOX) injection  40 mg Subcutaneous Q24H  . fentaNYL  75 mcg Transdermal Q72H  . furosemide  40 mg Intravenous Daily  . insulin detemir  20 Units Subcutaneous BID  . metoCLOPramide (REGLAN) injection  10 mg Intravenous Q6H  . pantoprazole (PROTONIX) IV  40 mg Intravenous Q24H  . potassium chloride  20 mEq Oral Daily  . potassium chloride  20 mEq Oral Once  . sodium chloride flush  10-40 mL Intracatheter Q12H   Continuous Infusions: . amiodarone 30 mg/hr (09/04/15 0550)  . dextrose 5 % and 0.9% NaCl 10 mL/hr at 08/31/15 2010  . Marland KitchenTPN (CLINIMIX-E) Adult 100 mL/hr at 09/03/15 1810   And  . fat emulsion 240 mL (09/03/15 1810)  . Marland KitchenTPN  (CLINIMIX-E) Adult     And  . fat emulsion    . lactated ringers 10 mL/hr at 08/25/15 1000  . phenylephrine (NEO-SYNEPHRINE) Adult infusion Stopped (08/25/15 1700)   PRN Meds:.fentaNYL, haloperidol lactate, ipratropium-albuterol, LORazepam, metoprolol, ondansetron (ZOFRAN) IV, sodium chloride flush  Assessment/Plan: Patient Active Problem List   Diagnosis Date Noted  . Acute blood loss anemia   . Acute systolic congestive heart failure ([place])   . Coronary artery disease involving native coronary artery of native heart without angina pectoris   . Essential hypertension   . Obesity   . Paroxysmal atrial fibrillation ([facility])   . Subclavian artery stenosis ([place])   . Tachycardia   . Tachypnea   . 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 Unstable angina ([place]) 08/19/2015  . Abnormal myocardial perfusion study   . Hyperlipidemia 01/30/2015  . Faintness   . 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 Thrombocytopenia ([place]) 10/16/2014  . Atrial flutter ([place]) 10/16/2014  . Elevated LFTs 10/16/2014  . Subclavian steal syndrome 10/16/2014  . Subclavian artery stenosis, left 10/16/2014  . Diastolic dysfunction [id number]  . Carotid stenosis 10/16/2014  . Bruit of left carotid artery   . Dehydration   . Syncope 10/14/2014  . ETOH abuse 10/14/2014  . Hyponatremia 10/14/2014  . Metabolic acidosis [id number]  . Chest pain 10/14/2014  . Laceration of head 10/14/2014  . Malnutrition ([place]) 10/14/2014  . Forehead laceration   . Adjustment disorder with mixed anxiety and depressed mood 03/17/2013  . Aortic stenosis 08/03/2011  . Cerebrovascular disease 08/03/2011  . ASCVD (arteriosclerotic cardiovascular disease) 08/03/2011  . Bladder neck obstruction 08/18/2010  . PLEURAL EFFUSION, RIGHT 04/07/2010  . GERD 05/14/2009  . HLD (hyperlipidemia) 12/11/2006  . GOUT 12/11/2006  . DEPENDENCE, ALCOHOL NEC/NOS, UNSPECIFIED 12/11/2006  . ALLERGIC RHINITIS 12/11/2006  . LIVER FUNCTION TESTS, ABNORMAL  12/11/2006   s/p Procedure(s): EXPLORATORY LAPAROTOMY ASCENDING AND TRANSVERSE COLECTOMY 08/25/2015 -clamp ngt -continue TPN   LOS: 16 days   Mickeal Skinner, MD Pg# [phone] [facility]

## 2015-09-04 NOTE — Progress Notes (Signed)
CARDIAC REHAB PHASE I   PRE:  Rate/Rhythm: 93 SR  BP:  Sitting: 131/70      SaO2: 94 RA  MODE:  Ambulation: 250 ft   POST:  Rate/Rhythm: 113 ST  BP:  Sitting: 128/64     SaO2: 99 RA 1330-1400 Patient ambulated in hallway x 2 assist with RW. Steady shuffling gait noted. Patient denies complaints. NG clamped per surgeons order. Post ambulation patient back to chair with call bell, phone, and bedside table in reach.  Stevon Gough English PayneRN, BSN 09/04/2015 2:53 PM

## 2015-09-05 LAB — GLUCOSE, CAPILLARY
GLUCOSE-CAPILLARY: 125 mg/dL — AB (ref 65–99)
GLUCOSE-CAPILLARY: 119 mg/dL — AB (ref 65–99)
GLUCOSE-CAPILLARY: 124 mg/dL — AB (ref 65–99)

## 2015-09-05 LAB — BASIC METABOLIC PANEL
Anion gap: 8 (ref 5–15)
BUN: 22 mg/dL — ABNORMAL HIGH (ref 6–20)
CO2: 23 mmol/L (ref 22–32)
Calcium: 7.8 mg/dL — ABNORMAL LOW (ref 8.9–10.3)
Chloride: 99 mmol/L — ABNORMAL LOW (ref 101–111)
Creatinine, Ser: 0.82 mg/dL (ref 0.61–1.24)
GFR calc Af Amer: >60 mL/min (ref >60)
GFR calc non Af Amer: >60 mL/min (ref >60)
Glucose, Bld: 111 mg/dL — ABNORMAL HIGH (ref 65–99)
Potassium: 4 mmol/L (ref 3.5–5.1)
Sodium: 130 mmol/L — ABNORMAL LOW (ref 135–145)

## 2015-09-05 MED ORDER — TRACE MINERALS CR-CU-MN-SE-ZN 10-1000-500-60 MCG/ML IV SOLN
INTRAVENOUS | Status: DC
  Administered 2015-09-05: 17:00:00 via INTRAVENOUS
  Filled 2015-09-05: qty 2400

## 2015-09-05 MED ORDER — FAT EMULSION 20 % IV EMUL
240.0000 mL | INTRAVENOUS | Status: DC
  Administered 2015-09-05: 240 mL via INTRAVENOUS
  Filled 2015-09-05: qty 250

## 2015-09-05 NOTE — Progress Notes (Signed)
11 Days Post-Op  Subjective: Just had a bm this am. Feels ok  Objective: Vital signs in last 24 hours: Temp:  [97.7 F (36.5 C)-98.4 F (36.9 C)] 98.1 F (36.7 C) (05/21 0523) Pulse Rate:  [86-102] 89 (05/21 0917) Resp:  [18-20] 18 (05/21 0917) BP: (120-139)/(64-89) 139/65 mmHg (05/21 0523) SpO2:  [95 %-99 %] 97 % (05/21 0917) Weight:  [98.7 kg (217 lb 9.5 oz)] 98.7 kg (217 lb 9.5 oz) (05/21 0523) Last BM Date: 09/03/15  Intake/Output from previous day: 05/20 0701 - 05/21 0700 In: 66.8 [I.V.:66.8] Out: 2625 [Urine:2625] Intake/Output this shift: Total I/O In: 0  Out: 701 [Urine:700; Stool:1]  Resp: clear to auscultation bilaterally Cardio: regular rate and rhythm GI: soft, minimal tenderness. good bs  Lab Results:   Recent Labs  09/03/15 0455 09/04/15 0425  WBC 8.5 7.6  HGB 8.9* 8.4*  HCT 28.0* 25.8*  PLT 187 199   BMET  Recent Labs  09/04/15 0425 09/05/15 0510  NA 131* 130*  K 4.1 4.0  CL 98* 99*  CO2 24 23  GLUCOSE 119* 111*  BUN 22* 22*  CREATININE 0.88 0.82  CALCIUM 8.0* 7.8*   PT/INR No results for input(s): LABPROT, INR in the last 72 hours. ABG No results for input(s): PHART, HCO3 in the last 72 hours.  Invalid input(s): PCO2, PO2  Studies/Results: No results found.  Anti-infectives: Anti-infectives    Start     Dose/Rate Route Frequency Ordered Stop   08/26/15 1000  cefoTEtan (CEFOTAN) 1 g in dextrose 5 % 50 mL IVPB     1 g 100 mL/hr over 30 Minutes Intravenous  Once 08/26/15 0853 08/26/15 1057   08/25/15 1115  cefoTEtan in Dextrose 5% (CEFOTAN) IVPB 2 g     2 g Intravenous  Once 08/25/15 1111 08/25/15 1150   08/25/15 1113  cefoTEtan in Dextrose 5% (CEFOTAN) 2-2.08 GM-% IVPB    Comments:  [name]   : cabinet override      08/25/15 1113 08/25/15 2329   08/21/15 0400  cefUROXime (ZINACEF) 1.5 g in dextrose 5 % 50 mL IVPB     1.5 g 100 mL/hr over 30 Minutes Intravenous Every 12 hours 08/20/15 1742 08/22/15 1630   08/20/15  2330  vancomycin (VANCOCIN) IVPB 1000 mg/200 mL premix     1,000 mg 200 mL/hr over 60 Minutes Intravenous Every 12 hours 08/20/15 1827 08/21/15 2330   08/20/15 2315  vancomycin (VANCOCIN) IVPB 1000 mg/200 mL premix  Status:  Discontinued     1,000 mg 200 mL/hr over 60 Minutes Intravenous  Once 08/20/15 1742 08/20/15 1827   08/20/15 0400  vancomycin (VANCOCIN) 1,500 mg in sodium chloride 0.9 % 250 mL IVPB  Status:  Discontinued     1,500 mg 125 mL/hr over 120 Minutes Intravenous To Surgery 08/19/15 1605 08/20/15 1523   08/20/15 0400  cefUROXime (ZINACEF) 1.5 g in dextrose 5 % 50 mL IVPB  Status:  Discontinued     1.5 g 100 mL/hr over 30 Minutes Intravenous To Surgery 08/19/15 1605 08/20/15 1523   08/20/15 0400  cefUROXime (ZINACEF) 750 mg in dextrose 5 % 50 mL IVPB  Status:  Discontinued     750 mg 100 mL/hr over 30 Minutes Intravenous To Surgery 08/19/15 1605 08/20/15 1523      Assessment/Plan: s/p Procedure(s): EXPLORATORY LAPAROTOMY (N/A) ASCENDING AND TRANSVERSE COLECTOMY (N/A) Advance diet. Start clears D/c ng ambulate  LOS: 17 days    TOTH III,Victor Diaz S 09/05/2015

## 2015-09-05 NOTE — Progress Notes (Signed)
PARENTERAL NUTRITION CONSULT NOTE - FOLLOW UP  Pharmacy Consult for TPN Indication: Ileus  Allergies  Allergen Reactions  . Atorvastatin Anaphylaxis    LIPITOR -  facial swelling    Patient Measurements: Height: 5\' 6"  (167.6 cm) Weight: 217 lb 9.5 oz (98.7 kg) IBW/kg (Calculated) : 63.8 Adjusted Body Weight:  Usual Weight:   Vital Signs: Temp: 98.1 F (36.7 C) (05/21 0523) Temp Source: Oral (05/21 0523) BP: 139/65 mmHg (05/21 0523) Pulse Rate: 89 (05/21 0917) Intake/Output from previous day: 05/20 0701 - 05/21 0700 In: 66.8 [I.V.:66.8] Out: 2625 [Urine:2625] Intake/Output from this shift:    Labs:  Recent Labs  09/03/15 0455 09/04/15 0425  WBC 8.5 7.6  HGB 8.9* 8.4*  HCT 28.0* 25.8*  PLT 187 199     Recent Labs  09/04/15 0425 09/05/15 0510  NA 131* 130*  K 4.1 4.0  CL 98* 99*  CO2 24 23  GLUCOSE 119* 111*  BUN 22* 22*  CREATININE 0.88 0.82  CALCIUM 8.0* 7.8*  MG 2.3  --    Estimated Creatinine Clearance: 90.9 mL/min (by C-G formula based on Cr of 0.82).    Recent Labs  09/04/15 1806 09/04/15 2351 09/05/15 0553  GLUCAP 112* 122* 125*    Medications:  Scheduled:  . antiseptic oral rinse  7 mL Mouth Rinse q12n4p  . bisacodyl  10 mg Oral Daily   Or  . bisacodyl  10 mg Rectal Daily  . bisacodyl  10 mg Rectal Once  . budesonide (PULMICORT) nebulizer solution  0.5 mg Nebulization BID  . chlorhexidine  15 mL Mouth Rinse BID  . docusate  200 mg Oral Daily  . enoxaparin (LOVENOX) injection  40 mg Subcutaneous Q24H  . fentaNYL  75 mcg Transdermal Q72H  . furosemide  40 mg Intravenous Daily  . insulin detemir  20 Units Subcutaneous BID  . metoCLOPramide (REGLAN) injection  10 mg Intravenous Q6H  . pantoprazole (PROTONIX) IV  40 mg Intravenous Q24H  . potassium chloride  20 mEq Oral Daily  . potassium chloride  20 mEq Oral Once  . 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 Surgery was consulted for a possible volvulus. Has a history of ileus that resolved after a couple days s/p prior surgery.  GI: ileus dilated cecum to 15 cm. 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 Albumin low at 2.3. Prealbumin up to 11.2. Abd X-ray on 5/18- persistent ileus. NG clamped 5/20.  BM x 2 on 5/19, mod abd distention.  Awaiting return of bowel function. On PPI IV, Reglan IV q6 thru tomorrow, Docusate 200mg  per tube qday Endo: No hx DM. CBGs controlled (112-125)  Levemir 20 units BID.  Lytes: K at 4 (goal > 4) on KCL 73mEq daily. Mg 2.3 (goal > 2). Renal: SCr stable, CrCl ~19ml/min. I/O (-)2.5L.  UOP 1.49ml/kg/hr. D5NS @ KVO Pulm: Extubated on 5/11. RA. Pulmicort Cards: S/P CABG 08/20/15. VSS (Afib) Amio gtt at 30mg /hr. Off pressors. Continues on lasix 40mg  IV qday Hepatobil: LFTs/Tbili wnl, TG wnl Neuro: hx EtOH abuse, taking beer BID while admitted to prevent withdrawal. On Fent patch for pain control ID:  Afebrile, WBC wnl, no antibiotics  Best Practices: Lovenox  TPN Access: PICC placed 5/8 TPN start date: 08/25/15  Nutritional Goals: per RD recs on 5/11 Kcal: 2100-2300 Protein: 115-125 g  Current Nutrition:  NPO Clinimix E 5/15 at 149ml/hr + lipid emulsions at 40ml/hr, providing 120 g of protein and 2184 kCals per day and meeting 100% of nutritional goals  Plan:  Continue Clinimix E 5/15 at 141ml/hr and 20% lipid emulsion at 52ml/hr Add MVI and TE in TPN Add thiamine and folic acid to TPN Continue Levemir 20 units BID per MD TPN labs qMon/Thurs F/U return of bowel function, advancement of diet   Gracy Bruins, PharmD Clinical Pharmacist [facility]

## 2015-09-05 NOTE — Progress Notes (Signed)
Utilization review completed.  

## 2015-09-05 NOTE — Progress Notes (Addendum)
[address]       [place] [address]             [phone]      11 Days Post-Op Procedure(s) (LRB): EXPLORATORY LAPAROTOMY (N/A) ASCENDING AND TRANSVERSE COLECTOMY (N/A) Subjective: Says he conts to feel a bit better each day + Flatus, no abdominal pain  Objective: Vital signs in last 24 hours: Temp:  [97.7 F (36.5 C)-98.4 F (36.9 C)] 98.1 F (36.7 C) (05/21 0523) Pulse Rate:  [86-102] 86 (05/21 0523) Cardiac Rhythm:  [-] Normal sinus rhythm (05/21 0700) Resp:  [18-20] 18 (05/21 0523) BP: (120-139)/(64-89) 139/65 mmHg (05/21 0523) SpO2:  [95 %-99 %] 98 % (05/20 2004) Weight:  [217 lb 9.5 oz (98.7 kg)] 217 lb 9.5 oz (98.7 kg) (05/21 0523)  Hemodynamic parameters for last 24 hours:    Intake/Output from previous day: 05/20 0701 - 05/21 0700 In: 66.8 [I.V.:66.8] Out: 2625 [Urine:2625] Intake/Output this shift:    General appearance: alert, cooperative and no distress Heart: regular rate and rhythm and 2/6 systolic murmur Lungs: dim in the bases Abdomen: mod distension, + BS, min incis tenderness Extremities: min edema Wound: Chest EVH incis stable- abdominal incis without significant erethema, + sero sang drainage  Lab Results:  Recent Labs  09/03/15 0455 09/04/15 0425  WBC 8.5 7.6  HGB 8.9* 8.4*  HCT 28.0* 25.8*  PLT 187 199   BMET:  Recent Labs  09/04/15 0425 09/05/15 0510  NA 131* 130*  K 4.1 4.0  CL 98* 99*  CO2 24 23  GLUCOSE 119* 111*  BUN 22* 22*  CREATININE 0.88 0.82  CALCIUM 8.0* 7.8*    PT/INR: No results for input(s): LABPROT, INR in the last 72 hours. ABG    Component Value Date/Time   PHART 7.461* 08/27/2015 0605   HCO3 27.0* 08/27/2015 0605   TCO2 28.1 08/27/2015 0605   ACIDBASEDEF 1.0 08/25/2015 1503   O2SAT 98.8 08/27/2015 0605   CBG (last 3)   Recent Labs  09/04/15 1806 09/04/15 2351 09/05/15 0553  GLUCAP 112* 122* 125*    Meds Scheduled Meds: . antiseptic oral rinse  7 mL Mouth Rinse  q12n4p  . bisacodyl  10 mg Oral Daily   Or  . bisacodyl  10 mg Rectal Daily  . bisacodyl  10 mg Rectal Once  . budesonide (PULMICORT) nebulizer solution  0.5 mg Nebulization BID  . chlorhexidine  15 mL Mouth Rinse BID  . docusate  200 mg Oral Daily  . enoxaparin (LOVENOX) injection  40 mg Subcutaneous Q24H  . fentaNYL  75 mcg Transdermal Q72H  . furosemide  40 mg Intravenous Daily  . insulin detemir  20 Units Subcutaneous BID  . metoCLOPramide (REGLAN) injection  10 mg Intravenous Q6H  . pantoprazole (PROTONIX) IV  40 mg Intravenous Q24H  . potassium chloride  20 mEq Oral Daily  . potassium chloride  20 mEq Oral Once  . sodium chloride flush  10-40 mL Intracatheter Q12H   Continuous Infusions: . amiodarone 30 mg/hr (09/05/15 0550)  . dextrose 5 % and 0.9% NaCl 10 mL/hr at 08/31/15 2010  . Marland KitchenTPN (CLINIMIX-Diaz) Adult 100 mL/hr at 09/04/15 1727   And  . fat emulsion 240 mL (09/04/15 1727)  . lactated ringers 10 mL/hr at 08/25/15 1000  . phenylephrine (NEO-SYNEPHRINE) Adult infusion Stopped (08/25/15 1700)   PRN Meds:.fentaNYL, haloperidol lactate, ipratropium-albuterol, LORazepam, metoprolol, ondansetron (ZOFRAN) IV, sodium chloride flush  Xrays No results found.  Assessment/Plan: S/P  Procedure(s) (LRB): EXPLORATORY LAPAROTOMY (N/A) ASCENDING AND TRANSVERSE COLECTOMY (N/A)  1 conts with slow clinical progress 2 conts with intermit afib- on amio gtt 3 mild hyponatremia- on TPN for nutrition, conts NGT- advance per general surgery 4 will need oral BP agent when taking po 5 sugars well controlled  LOS: 17 days    Victor Diaz,Victor Diaz 09/05/2015   Chart reviewed, patient examined, agree with above. Had BM today. NG is out. Clear liquids started.

## 2015-09-06 LAB — DIFFERENTIAL
BASOS PCT: 0 %
Basophils Absolute: 0 10*3/uL (ref 0.0–0.1)
EOS ABS: 0.1 10*3/uL (ref 0.0–0.7)
EOS PCT: 3 %
Lymphocytes Relative: 14 %
Lymphs Abs: 0.7 10*3/uL (ref 0.7–4.0)
MONOS PCT: 17 %
Monocytes Absolute: 0.9 10*3/uL (ref 0.1–1.0)
NEUTROS ABS: 3.7 10*3/uL (ref 1.7–7.7)
Neutrophils Relative %: 66 %

## 2015-09-06 LAB — COMPREHENSIVE METABOLIC PANEL
ALK PHOS: 56 U/L (ref 38–126)
ALT: 23 U/L (ref 17–63)
AST: 40 U/L (ref 15–41)
Albumin: 2.2 g/dL — ABNORMAL LOW (ref 3.5–5.0)
Anion gap: 10 (ref 5–15)
BUN: 22 mg/dL — ABNORMAL HIGH (ref 6–20)
CALCIUM: 7.9 mg/dL — AB (ref 8.9–10.3)
CO2: 21 mmol/L — ABNORMAL LOW (ref 22–32)
CREATININE: 0.88 mg/dL (ref 0.61–1.24)
Chloride: 97 mmol/L — ABNORMAL LOW (ref 101–111)
Glucose, Bld: 114 mg/dL — ABNORMAL HIGH (ref 65–99)
Potassium: 4.2 mmol/L (ref 3.5–5.1)
Sodium: 128 mmol/L — ABNORMAL LOW (ref 135–145)
TOTAL PROTEIN: 5.8 g/dL — AB (ref 6.5–8.1)
Total Bilirubin: 0.6 mg/dL (ref 0.3–1.2)

## 2015-09-06 LAB — GLUCOSE, CAPILLARY
GLUCOSE-CAPILLARY: 124 mg/dL — AB (ref 65–99)
Glucose-Capillary: 121 mg/dL — ABNORMAL HIGH (ref 65–99)
Glucose-Capillary: 131 mg/dL — ABNORMAL HIGH (ref 65–99)
Glucose-Capillary: 115 mg/dL — ABNORMAL HIGH (ref 65–99)

## 2015-09-06 LAB — TRIGLYCERIDES: TRIGLYCERIDES: 64 mg/dL (ref <150)

## 2015-09-06 LAB — PREALBUMIN: PREALBUMIN: 8 mg/dL — AB (ref 18–38)

## 2015-09-06 LAB — MAGNESIUM: MAGNESIUM: 2.2 mg/dL (ref 1.7–2.4)

## 2015-09-06 LAB — PHOSPHORUS: Phosphorus: 3.9 mg/dL (ref 2.5–4.6)

## 2015-09-06 MED ORDER — TRACE MINERALS CR-CU-MN-SE-ZN 10-1000-500-60 MCG/ML IV SOLN
INTRAVENOUS | Status: DC
  Administered 2015-09-06: 17:00:00 via INTRAVENOUS
  Filled 2015-09-06: qty 2400

## 2015-09-06 MED ORDER — ACETAMINOPHEN 325 MG PO TABS
650.0000 mg | ORAL_TABLET | Freq: Four times a day (QID) | ORAL | Status: DC | PRN
  Administered 2015-09-06: 650 mg via ORAL
  Filled 2015-09-06: qty 2

## 2015-09-06 MED ORDER — AMIODARONE HCL 200 MG PO TABS
200.0000 mg | ORAL_TABLET | Freq: Two times a day (BID) | ORAL | Status: DC
  Administered 2015-09-06 – 2015-09-09 (×7): 200 mg via ORAL
  Filled 2015-09-06 (×7): qty 1

## 2015-09-06 MED ORDER — DOCUSATE SODIUM 100 MG PO CAPS
200.0000 mg | ORAL_CAPSULE | Freq: Every day | ORAL | Status: DC
  Administered 2015-09-07 – 2015-09-08 (×2): 200 mg via ORAL
  Filled 2015-09-06 (×2): qty 2

## 2015-09-06 MED ORDER — FAT EMULSION 20 % IV EMUL
240.0000 mL | INTRAVENOUS | Status: DC
  Administered 2015-09-06: 240 mL via INTRAVENOUS
  Filled 2015-09-06: qty 250

## 2015-09-06 MED ORDER — FUROSEMIDE 10 MG/ML IJ SOLN: 20.0000 mg | Freq: Every day | INTRAMUSCULAR | Status: DC

## 2015-09-06 NOTE — Progress Notes (Signed)
Order received to remove chest tube sutures.  Sutures removed and intact.  Pt tol well.

## 2015-09-06 NOTE — Progress Notes (Signed)
CARDIAC REHAB PHASE I   PRE:  Rate/Rhythm: 90 SR  BP:  Supine:   Sitting: 136/67  Standing:    SaO2: 96%RA  MODE:  Ambulation: 350 ft   POST:  Rate/Rhythm: 106  BP:  Supine:   Sitting: 142/70  Standing:    SaO2: 100%RA 1355-1435 Pt walked 350 ft on RA with rollator, gait belt use and asst x 1 with steady gait. Sat once to rest. Tolerated increase in distance well. Back to recliner after walk.   Graylon Good, RN BSN  09/06/2015 2:32 PM

## 2015-09-06 NOTE — Progress Notes (Signed)
Physical Therapy Treatment Patient Details Name: Victor Diaz MRN: [id number] DOB: 1946-03-16 Today's Date: 09/06/2015    History of Present Illness pt presents with CABG x4 on 5/6; Rt hemicolectomy 5/10.  pt with hx of heavy Etoh use, L THA, Gout, HTN, and A-Flutter.      PT Comments    Pt is demonstrating a good effort with O2 sats maintained on room air with walk, pulses in 75 B/min range pregait and 96 post gait.  Has a lot of ground to cover and would benefit from inpt therapy to increase standing stability and independence prior to going home.  Follow Up Recommendations  CIR     Equipment Recommendations  None recommended by PT    Recommendations for Other Services Rehab consult     Precautions / Restrictions Precautions Precautions: Fall;Sternal Precaution Comments: Chest tubes, Gordy Councilman Restrictions Weight Bearing Restrictions: Yes (sternal precautions) Other Position/Activity Restrictions: Pt is not using pillow to move until PT encouraged him    Mobility  Bed Mobility               General bed mobility comments: up when PT arrived  Transfers Overall transfer level: Needs assistance Equipment used: 4-wheeled walker Transfers: Sit to/from [facility] Sit to Stand: Min guard Stand pivot transfers: Min guard       General transfer comment: Held pillow and was able to power up with LEs only.  Did rock for momentum  Ambulation/Gait Ambulation/Gait assistance: Min guard;Min assist (had one moment of unsteadiness) Ambulation Distance (Feet): 180 Feet Assistive device: 4-wheeled walker Gait Pattern/deviations: Step-through pattern;Wide base of support;Drifts right/left Gait velocity: reduced Gait velocity interpretation: Below normal speed for age/gender General Gait Details: Pt needed postural prompts, safety instructions and reminders about brakes and transitions.  SOB and needed reminders for pace, standing rests   Stairs             Wheelchair Mobility    Modified Rankin (Stroke Patients Only)       Balance Overall balance assessment: Needs assistance Sitting-balance support: Feet supported Sitting balance-Leahy Scale: Good   Postural control: Posterior lean Standing balance support: Bilateral upper extremity supported Standing balance-Leahy Scale: Fair Standing balance comment: fair- dynamic balance                    Cognition Arousal/Alertness: Awake/alert Behavior During Therapy: WFL for tasks assessed/performed Overall Cognitive Status: Within Functional Limits for tasks assessed                      Exercises General Exercises - Lower Extremity Ankle Circles/Pumps: AROM;Both;10 reps Long Arc Quad: AROM;Both;10 reps Heel Slides: AROM;Both;10 reps Hip ABduction/ADduction: AROM;Both;10 reps Toe Raises: AROM;Both;10 reps    General Comments General comments (skin integrity, edema, etc.): Pt is up to walk with  4 wheeled walker with reminders for safety and brakes.  Pt states he has been using with staff which must be nursing and cardiac rehab.  He is unsafe on Rollator unless assistance is provided.      Pertinent Vitals/Pain Pain Assessment: Faces Faces Pain Scale: Hurts a little bit Pain Location: chest Pain Descriptors / Indicators: Operative site guarding Pain Intervention(s): Monitored during session;Premedicated before session;Repositioned;Other (comment) (reinstructed on sternal precautions)    Home Living                      Prior Function            PT  Goals (current goals can now be found in the care plan section) Progress towards PT goals: Progressing toward goals    Frequency  Min 3X/week    PT Plan Current plan remains appropriate    Co-evaluation             End of Session Equipment Utilized During Treatment: Gait belt Activity Tolerance: Patient limited by fatigue Patient left: in chair;with call bell/phone within reach;with  nursing/sitter in room     Time: 1040-1109 PT Time Calculation (min) (ACUTE ONLY): 29 min  Charges:  $Gait Training: 8-22 mins $Therapeutic Exercise: 8-22 mins                    G Codes:      Ramond Dial 09-22-2015, 12:14 PM    Mee Hives, PT MS Acute Rehab Dept. Number: [facility]

## 2015-09-06 NOTE — Progress Notes (Addendum)
      [address]       [place] [address]             [phone]      12 Days Post-Op Procedure(s) (LRB): EXPLORATORY LAPAROTOMY (N/A) ASCENDING AND TRANSVERSE COLECTOMY (N/A)   Subjective:  Victor Diaz has no new complaints.  He is tolerating clears, denies nausea, vomiting, abdominal pain... NG tube is out.  Has moved bowels  Objective: Vital signs in last 24 hours: Temp:  [97.5 F (36.4 C)-98.3 F (36.8 C)] 98.3 F (36.8 C) (05/22 0516) Pulse Rate:  [85-89] 87 (05/22 0516) Cardiac Rhythm:  [-] Normal sinus rhythm (05/21 1900) Resp:  [18-24] 18 (05/22 0516) BP: (116-131)/(56-58) 122/57 mmHg (05/22 0516) SpO2:  [96 %-99 %] 97 % (05/22 0516) Weight:  [219 lb 12.8 oz (99.7 kg)] 219 lb 12.8 oz (99.7 kg) (05/22 0516)  Intake/Output from previous day: 05/21 0701 - 05/22 0700 In: 720 [P.O.:720] Out: 2851 [Urine:2850; Stool:1]  General appearance: alert, cooperative and no distress Heart: regular rate and rhythm Lungs: clear to auscultation bilaterally Abdomen: soft, mild disention, non-tender, + BS Extremities: edema trace, ecchymosis on RLE from [facility] Wound: clean and dry  Lab Results:  Recent Labs  09/04/15 0425  WBC 7.6  HGB 8.4*  HCT 25.8*  PLT 199   BMET:  Recent Labs  09/05/15 0510 09/06/15 0520  NA 130* 128*  K 4.0 4.2  CL 99* 97*  CO2 23 21*  GLUCOSE 111* 114*  BUN 22* 22*  CREATININE 0.82 0.88  CALCIUM 7.8* 7.9*    PT/INR: No results for input(s): LABPROT, INR in the last 72 hours. ABG    Component Value Date/Time   PHART 7.461* 08/27/2015 0605   HCO3 27.0* 08/27/2015 0605   TCO2 28.1 08/27/2015 0605   ACIDBASEDEF 1.0 08/25/2015 1503   O2SAT 98.8 08/27/2015 0605   CBG (last 3)   Recent Labs  09/05/15 1842 09/06/15 0007 09/06/15 0609  GLUCAP 124* 124* 121*    Assessment/Plan: S/P Procedure(s) (LRB): EXPLORATORY LAPAROTOMY (N/A) ASCENDING AND TRANSVERSE COLECTOMY (N/A)  1. CV- remains hemodynamically stable  on Amiodarone, Lopressor IV 2. Pulm- off oxygen, no acute issues 3. Renal- creatinine WNL, remains hypervolemic 4. GI- S/P Ex Lap, tolerating clears, NG tube is out... Advance diet per surgery 5. Dispo- patient stable, continue current care   LOS: 18 days    BARRETT, ERIN 09/06/2015  Change iv amiodarone to 200 po bid Will need CIR or SNF at DC patient examined and medical record reviewed,agree with above note. Tharon Aquas Trigt III 09/06/2015

## 2015-09-06 NOTE — Progress Notes (Signed)
12 Days Post-Op  Subjective: Clear liquids started yesterday.  Seems to be tolerating them ok No nausea but no further BM or flatus  Objective: Vital signs in last 24 hours: Temp:  [97.5 F (36.4 C)-98.3 F (36.8 C)] 98.3 F (36.8 C) (05/22 0516) Pulse Rate:  [85-89] 87 (05/22 0516) Resp:  [18-24] 18 (05/22 0516) BP: (116-131)/(56-58) 122/57 mmHg (05/22 0516) SpO2:  [96 %-99 %] 97 % (05/22 0516) Weight:  [99.7 kg (219 lb 12.8 oz)] 99.7 kg (219 lb 12.8 oz) (05/22 0516) Last BM Date: 09/05/15  Intake/Output from previous day: 05/21 0701 - 05/22 0700 In: 720 [P.O.:720] Out: 2851 [Urine:2850; Stool:1] Intake/Output this shift: Total I/O In: -  Out: 150 [Urine:150]  Abdomen obese, soft.  Wound stable, staples intact Lab Results:   Recent Labs  09/04/15 0425  WBC 7.6  HGB 8.4*  HCT 25.8*  PLT 199   BMET  Recent Labs  09/05/15 0510 09/06/15 0520  NA 130* 128*  K 4.0 4.2  CL 99* 97*  CO2 23 21*  GLUCOSE 111* 114*  BUN 22* 22*  CREATININE 0.82 0.88  CALCIUM 7.8* 7.9*   PT/INR No results for input(s): LABPROT, INR in the last 72 hours. ABG No results for input(s): PHART, HCO3 in the last 72 hours.  Invalid input(s): PCO2, PO2  Studies/Results: No results found.  Anti-infectives: Anti-infectives    Start     Dose/Rate Route Frequency Ordered Stop   08/26/15 1000  cefoTEtan (CEFOTAN) 1 g in dextrose 5 % 50 mL IVPB     1 g 100 mL/hr over 30 Minutes Intravenous  Once 08/26/15 0853 08/26/15 1057   08/25/15 1115  cefoTEtan in Dextrose 5% (CEFOTAN) IVPB 2 g     2 g Intravenous  Once 08/25/15 1111 08/25/15 1150   08/25/15 1113  cefoTEtan in Dextrose 5% (CEFOTAN) 2-2.08 GM-% IVPB    Comments:  Victor Diaz   : cabinet override      08/25/15 1113 08/25/15 2329   08/21/15 0400  cefUROXime (ZINACEF) 1.5 g in dextrose 5 % 50 mL IVPB     1.5 g 100 mL/hr over 30 Minutes Intravenous Every 12 hours 08/20/15 1742 08/22/15 1630   08/20/15 2330  vancomycin (VANCOCIN)  IVPB 1000 mg/200 mL premix     1,000 mg 200 mL/hr over 60 Minutes Intravenous Every 12 hours 08/20/15 1827 08/21/15 2330   08/20/15 2315  vancomycin (VANCOCIN) IVPB 1000 mg/200 mL premix  Status:  Discontinued     1,000 mg 200 mL/hr over 60 Minutes Intravenous  Once 08/20/15 1742 08/20/15 1827   08/20/15 0400  vancomycin (VANCOCIN) 1,500 mg in sodium chloride 0.9 % 250 mL IVPB  Status:  Discontinued     1,500 mg 125 mL/hr over 120 Minutes Intravenous To Surgery 08/19/15 1605 08/20/15 1523   08/20/15 0400  cefUROXime (ZINACEF) 1.5 g in dextrose 5 % 50 mL IVPB  Status:  Discontinued     1.5 g 100 mL/hr over 30 Minutes Intravenous To Surgery 08/19/15 1605 08/20/15 1523   08/20/15 0400  cefUROXime (ZINACEF) 750 mg in dextrose 5 % 50 mL IVPB  Status:  Discontinued     750 mg 100 mL/hr over 30 Minutes Intravenous To Surgery 08/19/15 1605 08/20/15 1523      Assessment/Plan: s/p Procedure(s): EXPLORATORY LAPAROTOMY (N/A) ASCENDING AND TRANSVERSE COLECTOMY (N/A)  Keep on clear liquid diet today  LOS: 18 days    Victor Diaz A 09/06/2015

## 2015-09-06 NOTE — Consult Note (Signed)
   [facility] Inpatient Consult   09/06/2015  DEMETRIAS TWOREK 09/15/1945 [id number]   Patient screened for [place] Management services. Went to bedside to offer and explain [facility] Care Management program with patient. Mr. Kimber states his plan is to go to rehab post hospital discharge. He requests that writer come back at a later time to discuss [place] Management should his disposition plan become home. Made inpatient RNCM aware. Will continue to follow along and engage when appropriate.  Marthenia Rolling, MSN-Ed, RN,BSN [facility] Liaison [phone]

## 2015-09-06 NOTE — Care Management Important Message (Signed)
Important Message  Patient Details  Name: Victor Diaz MRN: [id number] Date of Birth: 13-Jan-1946   Medicare Important Message Given:  Yes    Devonta Blanford Abena 09/06/2015, 1:32 PM

## 2015-09-06 NOTE — Progress Notes (Signed)
PARENTERAL NUTRITION CONSULT NOTE - FOLLOW UP  Pharmacy Consult for TPN Indication: Ileus  Allergies  Allergen Reactions  . Atorvastatin Anaphylaxis    LIPITOR -  facial swelling    Patient Measurements: Height: 5\' 6"  (167.6 cm) Weight: 219 lb 12.8 oz (99.7 kg) IBW/kg (Calculated) : 63.8  Intake/Output from previous day: 05/21 0701 - 05/22 0700 In: 720 [P.O.:720] Out: 2851 [Urine:2850; Stool:1] Intake/Output from this shift: Total I/O In: -  Out: 150 [Urine:150]  Labs:  Recent Labs  09/04/15 0425  WBC 7.6  HGB 8.4*  HCT 25.8*  PLT 199     Recent Labs  09/04/15 0425 09/05/15 0510 09/06/15 0520  NA 131* 130* 128*  K 4.1 4.0 4.2  CL 98* 99* 97*  CO2 24 23 21*  GLUCOSE 119* 111* 114*  BUN 22* 22* 22*  CREATININE 0.88 0.82 0.88  CALCIUM 8.0* 7.8* 7.9*  MG 2.3  --  2.2  PHOS  --   --  3.9  PROT  --   --  5.8*  ALBUMIN  --   --  2.2*  AST  --   --  40  ALT  --   --  23  ALKPHOS  --   --  56  BILITOT  --   --  0.6  PREALBUMIN  --   --  8.0*  TRIG  --   --  64   Estimated Creatinine Clearance: 85.2 mL/min (by C-G formula based on Cr of 0.88).    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 Now s/p ex-lap with R-hemicolectomy. Has a history of ileus that resolved after a couple days s/p prior surgery.  GI: ileus dilated cecum to 15 cm. 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 Abd X-ray on 5/18- persistent ileus. NG clamped 5/20. On PPI IV, Reglan IV q6h, Docusate 200mg  per tube qday, bisacodyl tab or supp 10mg  qday. Last BM 5/21. Diet advanced to clears yesterday by surgery.  Prealbumin remains low at 8, trigs in range at 64.  Endo: No hx DM. CBGs controlled (111-125)  Levemir 20 units BID.   Lytes: K 4.2 (goal > 4) on KCL 44mEq daily. Mg 2.2 (goal > 2), phos in range at 3.9, CorCa ~9.3, Na remains low at 128  Renal: SCr stable - 0.88, CrCl ~80-56ml/min. UOP 1.36ml/kg/hr  Pulm: Extubated on 5/11> RA. Pulmicort nebs  Cards: S/P CABG 08/20/15. VSS (Afib) Amio gtt at 30mg /hr, Lasix 40mg  IV qday. PRN Lopressor  Hepatobil: LFTs/Tbili wnl, TG wnl  Neuro: hx EtOH abuse. On Fent patch for pain control  ID: Afebrile, WBC wnl, no antibiotics  Best Practices: Lovenox  TPN Access: PICC placed 5/8 TPN start date: 08/25/15  Nutritional Goals: per RD recs on 5/17 Kcal: 2100-2300 Protein: 115-125 g  Current Nutrition:  -Clears -Clinimix E 5/15 at 125ml/hr + lipid emulsions at 67ml/hr> provides 120 g of protein and 2184 kCals per day which meets 100% of nutritional goals   Plan:  -Continue Clinimix E 5/15 at 179ml/hr and 20% lipid emulsion at 25ml/hr -MVI and full trace elements in TPN -thiamine 100mg  and  folic acid 1mg  added to TPN -Continue Levemir 20 units BID per MD -TPN labs qMon/Thurs, BMET with mag and phos in the morning -F/U continued advancement of diet, surgical plans  [place] Droll D. Keyra Virella, PharmD, BCPS Clinical Pharmacist Pager: [phone] 09/06/2015 7:49 AM

## 2015-09-07 DIAGNOSIS — L8990 Pressure ulcer of unspecified site, unspecified stage: Secondary | ICD-10-CM | POA: Insufficient documentation

## 2015-09-07 HISTORY — DX: Pressure ulcer of unspecified site, unspecified stage: L89.90

## 2015-09-07 LAB — BASIC METABOLIC PANEL
Anion gap: 9 (ref 5–15)
BUN: 19 mg/dL (ref 6–20)
CO2: 24 mmol/L (ref 22–32)
Calcium: 8.2 mg/dL — ABNORMAL LOW (ref 8.9–10.3)
Chloride: 98 mmol/L — ABNORMAL LOW (ref 101–111)
Creatinine, Ser: 0.86 mg/dL (ref 0.61–1.24)
GFR calc Af Amer: >60 mL/min (ref >60)
GFR calc non Af Amer: >60 mL/min (ref >60)
Glucose, Bld: 115 mg/dL — ABNORMAL HIGH (ref 65–99)
Potassium: 4.3 mmol/L (ref 3.5–5.1)
Sodium: 131 mmol/L — ABNORMAL LOW (ref 135–145)

## 2015-09-07 LAB — MAGNESIUM: Magnesium: 2.2 mg/dL (ref 1.7–2.4)

## 2015-09-07 LAB — GLUCOSE, CAPILLARY
GLUCOSE-CAPILLARY: 105 mg/dL — AB (ref 65–99)
GLUCOSE-CAPILLARY: 114 mg/dL — AB (ref 65–99)
GLUCOSE-CAPILLARY: 119 mg/dL — AB (ref 65–99)
Glucose-Capillary: 177 mg/dL — ABNORMAL HIGH (ref 65–99)

## 2015-09-07 LAB — PHOSPHORUS: Phosphorus: 3.5 mg/dL (ref 2.5–4.6)

## 2015-09-07 MED ORDER — FAT EMULSION 20 % IV EMUL: 240.0000 mL | INTRAVENOUS | Status: DC

## 2015-09-07 MED ORDER — TRACE MINERALS CR-CU-MN-SE-ZN 10-1000-500-60 MCG/ML IV SOLN: INTRAVENOUS | Status: DC

## 2015-09-07 MED ORDER — TRAMADOL HCL 50 MG PO TABS
50.0000 mg | ORAL_TABLET | Freq: Four times a day (QID) | ORAL | Status: DC | PRN
  Administered 2015-09-07: 50 mg via ORAL
  Filled 2015-09-07: qty 1

## 2015-09-07 MED ORDER — LORAZEPAM 0.5 MG PO TABS
0.5000 mg | ORAL_TABLET | Freq: Every day | ORAL | Status: DC
  Administered 2015-09-07 – 2015-09-08 (×2): 0.5 mg via ORAL
  Filled 2015-09-07 (×2): qty 1

## 2015-09-07 MED ORDER — FUROSEMIDE 10 MG/ML IJ SOLN
40.0000 mg | Freq: Every day | INTRAMUSCULAR | Status: DC
  Administered 2015-09-07 – 2015-09-08 (×2): 40 mg via INTRAVENOUS
  Filled 2015-09-07 (×2): qty 4

## 2015-09-07 MED ORDER — PANTOPRAZOLE SODIUM 40 MG PO TBEC
40.0000 mg | DELAYED_RELEASE_TABLET | Freq: Every day | ORAL | Status: DC
  Administered 2015-09-07 – 2015-09-08 (×2): 40 mg via ORAL
  Filled 2015-09-07 (×2): qty 1

## 2015-09-07 MED ORDER — FAT EMULSION 20 % IV EMUL
240.0000 mL | INTRAVENOUS | Status: DC
  Filled 2015-09-07: qty 250

## 2015-09-07 MED ORDER — TRACE MINERALS CR-CU-MN-SE-ZN 10-1000-500-60 MCG/ML IV SOLN
INTRAVENOUS | Status: DC
  Filled 2015-09-07: qty 2400

## 2015-09-07 NOTE — Progress Notes (Signed)
Patient lying in bed, no pain but is requesting something to help him sleep.  Advised I would call the physician on call. No other needs at this time, call light within reach.

## 2015-09-07 NOTE — Discharge Summary (Signed)
Physician Discharge Summary  Patient ID: Victor Diaz MRN: [id number] DOB/AGE: 1945/12/24 70 y.o.  Admit date: 08/19/2015 Discharge date: 09/09/2015  Admission Diagnoses:  Patient Active Problem List   Diagnosis Date Noted  . Unstable angina ([place]) 08/19/2015  . Abnormal myocardial perfusion study   . Hyperlipidemia 01/30/2015  . Faintness   . 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 Thrombocytopenia (Victor Diaz) 10/16/2014  . Atrial flutter ([place]) 10/16/2014  . Elevated LFTs 10/16/2014  . Subclavian steal syndrome 10/16/2014  . Subclavian artery stenosis, left 10/16/2014  . Diastolic dysfunction [id number]  . Carotid stenosis 10/16/2014  . Bruit of left carotid artery   . Dehydration   . Syncope 10/14/2014  . ETOH abuse 10/14/2014  . Hyponatremia 10/14/2014  . Metabolic acidosis [id number]  . Chest pain 10/14/2014  . Laceration of head 10/14/2014  . Malnutrition ([place]) 10/14/2014  . Forehead laceration   . Adjustment disorder with mixed anxiety and depressed mood 03/17/2013  . Aortic stenosis 08/03/2011  . Cerebrovascular disease 08/03/2011  . ASCVD (arteriosclerotic cardiovascular disease) 08/03/2011  . Bladder neck obstruction 08/18/2010  . PLEURAL EFFUSION, RIGHT 04/07/2010  . GERD 05/14/2009  . HLD (hyperlipidemia) 12/11/2006  . GOUT 12/11/2006  . DEPENDENCE, ALCOHOL NEC/NOS, UNSPECIFIED 12/11/2006  . ALLERGIC RHINITIS 12/11/2006  . LIVER FUNCTION TESTS, ABNORMAL 12/11/2006   Discharge Diagnoses:   Patient Active Problem List   Diagnosis Date Noted  . Pressure ulcer 09/07/2015  . Acute blood loss anemia   . Acute systolic congestive heart failure ([place])   . Coronary artery disease involving native coronary artery of native heart without angina pectoris   . Essential hypertension   . Obesity   . Paroxysmal atrial fibrillation ([facility])   . Subclavian artery stenosis ([place])   . Tachycardia   . Tachypnea   . 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 Unstable angina ([place])  08/19/2015  . Abnormal myocardial perfusion study   . Hyperlipidemia 01/30/2015  . Faintness   . 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 Thrombocytopenia ([place]) 10/16/2014  . Atrial flutter ([place]) 10/16/2014  . Elevated LFTs 10/16/2014  . Subclavian steal syndrome 10/16/2014  . Subclavian artery stenosis, left 10/16/2014  . Diastolic dysfunction [id number]  . Carotid stenosis 10/16/2014  . Bruit of left carotid artery   . Dehydration   . Syncope 10/14/2014  . ETOH abuse 10/14/2014  . Hyponatremia 10/14/2014  . Metabolic acidosis [id number]  . Chest pain 10/14/2014  . Laceration of head 10/14/2014  . Malnutrition ([place]) 10/14/2014  . Forehead laceration   . Adjustment disorder with mixed anxiety and depressed mood 03/17/2013  . Aortic stenosis 08/03/2011  . Cerebrovascular disease 08/03/2011  . ASCVD (arteriosclerotic cardiovascular disease) 08/03/2011  . Bladder neck obstruction 08/18/2010  . PLEURAL EFFUSION, RIGHT 04/07/2010  . GERD 05/14/2009  . HLD (hyperlipidemia) 12/11/2006  . GOUT 12/11/2006  . DEPENDENCE, ALCOHOL NEC/NOS, UNSPECIFIED 12/11/2006  . ALLERGIC RHINITIS 12/11/2006  . LIVER FUNCTION TESTS, ABNORMAL 12/11/2006   Discharged Condition: good  History of Present Illness:  Victor Diaz is a 70 yo white male with history of nicotine and alcohol abuse, Intermittent Atrial Flutter and Subclavian stenosis S/P left subclavian artery stent last year.  Despite the stent placement the patient continued to have episodes of dizziness and syncope.  He presented with complaints of syncope, exertional chest discomfort, and decreasing exercise tolerance. He had a stress test which was positive for ischemia.  Due to these results he underwent cardiac catheterization by Dr. Irish Lack on 08/19/2015 and was  found to have severe multivessel CAD.  It was felt coronary artery bypass grafting would be indicated and TCTS consult was placed.   Hospital Course:   He was admitted to  the hospital post catheterization.  He was evaluated by Dr. Prescott Gum who was in agreement he would require coronary bypass grafting procedure.  The risks and benefits of the procedure were explained to the patient and he was agreeable to proceed.  He was taken to the operating room on 08/20/2015.  He underwent CABG x 4 utilizing LIMA to LAD, SVG to OM, SVG to RCA, and SVG to Diagonal.  He also underwent endoscopic harvest of greater saphenous vein from his right leg.  He tolerated the procedure without difficulty and was taken to the SICU in stable condition.  During his stay in the SICU the patient was weaned and extubated.  He was treated with blood products for post operative coagulopathy.  He was treated with detox protocol for known alcohol abuse.  His chest tubes and arterial lines were removed without difficulty.  He developed post operative Atrial Fibrillation and was treated with IV Amiodarone, which ultimately was discontinued due to nausea.  The patient developed bowel distention despite moving his bowels.  He was found to have colonic ileus and GI consult was obtained. They recommended placement of an NG tube and aggressive potassium supplementation.  Follow up abdominal film continued to show worsening distention measuring 15 cm.  A rectal tube was placed with minimal return of stool.  General surgery consult was obtained for possibly volvulus.  They recommended Exploratory Laparotomy be performed due to impending risk of perforation.  The procedure was discuss with the patient and he was agreeable to proceed.  He was taken to the operating room on 08/25/2015.  He underwent Exploratory Laparotomy with right hemicolectomy with anastamosis.  He tolerated the procedure without difficulty and was taken back to the SICU.  He was weaned off the ventilator the day after surgery.  He was started on TPN.  He was treated with IV antibiotics for necrosis of colonic wall found during surgery.  He was treated with IV  diuretics for hypervolemia.  The patient's NG tube was removed and he was started on ice chips and sips of water, however his bowel function did not return and he was developing mild bowel distention.  He developed worsening nausea and vomiting.  ABD xray showed presence of ileus.  He required replacement of his NT tube on POD #4.  This resulted in removal of 700 cc of bilious fluid and the patient felt better.  He was felt medically stable for transfer to the stepdown unit on POD #7.  He continued to make slow progress.  His bowel function slowly recovered.  He was passing gas and moved his bowels.  His NT tube was removed on POD #11.  He was started on clear liquids at that time.  These were tolerated without difficulty, nausea, or vomiting.  He was advanced to full liquid diet on POD #13.  He has been tolerating a heart healthy diet and has had multiple bowel movements. He has been on Reglan since the first week of May and this will be stopped. He was restarted on his Toprol XL 25 mg daily. He needed further BP control so he was put on Lisinopril. He is felt surgically stable for transfer to CIR today.  Consults: GI and general surgery  Significant Diagnostic Studies: angiography:    Ost Cx to Prox Cx lesion, 100% stenosed. Left to left collaterals fill this vessel.  Prox LAD to Mid LAD lesion, 70% stenosed.  Mid LAD lesion, 75% stenosed.  Ost 2nd Diag to 2nd Diag lesion, 75% stenosed.  Ost RCA to Prox RCA lesion, 90% stenosed.  Mid RCA lesion, 50% stenosed.  Normal LVEDP.  Patent left subclavian stent and LIMA.  Treatments: surgery:   . Coronary artery bypass grafting x4 (left internal mammary artery to  LAD, saphenous vein graft to diagonal, saphenous vein graft to  circumflex marginal, saphenous vein graft to posterior descending). 2. Placement of left atrial clip - 40 mm AtriCure device. 3. Endoscopic harvest of right leg greater saphenous  vein.  EXPLORATORY LAPAROTOMY (N/A) RIGHT HEMI-COLECTOMY WITH ANASTAMOSIS(N/A)  Disposition: 01-Home or Self Care   Discharge Medications:  The patient has been discharged on:   1.Beta Blocker:  Yes [ x  ]                              No   [   ]                              If No, reason:  2.Ace Inhibitor/ARB: Yes [ x  ]                                     No  [   ]                                     If No, reason:  3.Statin:   Yes [   ]                  No  [  x ]                  If No, reason: allergy to Lipitor, no Zocor as on Amiodarone. Will restart after discharge from CIR  4.Ecasa:  Yes  [  x ]                  No   [   ]                  If No, reason:       Discharge Instructions    Amb Referral to Cardiac Rehabilitation    Complete by:  As directed   Diagnosis:  CABG  CABG X ___:  4            Medication List    STOP taking these medications        nitroGLYCERIN 0.4 MG SL tablet  Commonly known as:  NITROSTAT     omeprazole 20 MG capsule  Commonly known as:  PRILOSEC     simvastatin 40 MG tablet  Commonly known as:  ZOCOR      TAKE these medications        acetaminophen 325 MG tablet  Commonly known as:  TYLENOL  Take 2 tablets (650 mg total) by mouth every 6 (six) hours as needed for mild pain.     amiodarone 200 MG tablet  Commonly  known as:  PACERONE  Take 1 tablet (200 mg total) by mouth daily.  Start taking on:  09/10/2015     aspirin 325 MG tablet  Take 1 tablet (325 mg total) by mouth daily.     budesonide 0.5 MG/2ML nebulizer solution  Commonly known as:  PULMICORT  Take 2 mLs (0.5 mg total) by nebulization 2 (two) times daily.     cetirizine 10 MG tablet  Commonly known as:  ZYRTEC  TAKE ONE TABLET BY MOUTH ONCE DAILY AS NEEDED.     chlorhexidine 0.12 % solution  Commonly known as:  PERIDEX  15 mLs by Mouth Rinse route 2 (two) times daily.     enoxaparin 40 MG/0.4ML injection  Commonly known as:  LOVENOX  Inject 0.4  mLs (40 mg total) into the skin daily.     furosemide 40 MG tablet  Commonly known as:  LASIX  Take 1 tablet (40 mg total) by mouth 2 (two) times daily.     ipratropium-albuterol 0.5-2.5 (3) MG/3ML Soln  Commonly known as:  DUONEB  Take 3 mLs by nebulization every 6 (six) hours as needed.     lisinopril 2.5 MG tablet  Commonly known as:  PRINIVIL,ZESTRIL  Take 1 tablet (2.5 mg total) by mouth daily.     LORazepam 0.5 MG tablet  Commonly known as:  ATIVAN  Take 1 tablet (0.5 mg total) by mouth at bedtime.     metoprolol succinate 25 MG 24 hr tablet  Commonly known as:  TOPROL-XL  **MUST SCHEDULE APPT FOR FURTHER REFILLS.**     multivitamin with minerals Tabs tablet  Take 1 tablet by mouth daily.     ondansetron 4 MG/2ML Soln injection  Commonly known as:  ZOFRAN  Inject 2 mLs (4 mg total) into the vein every 6 (six) hours as needed for nausea or vomiting.     pantoprazole 40 MG tablet  Commonly known as:  PROTONIX  Take 1 tablet (40 mg total) by mouth daily.     potassium chloride SA 20 MEQ tablet  Commonly known as:  K-DUR,KLOR-CON  Take 1 tablet (20 mEq total) by mouth 2 (two) times daily.     traMADol 50 MG tablet  Commonly known as:  ULTRAM  Take 1 tablet (50 mg total) by mouth every 6 (six) hours as needed for moderate pain.       Follow-up Information    Follow up with Ivin Poot III, MD On 10/13/2015.   Specialty:  Cardiothoracic Surgery   Why:  Appointment is at 10:30   Contact information:   [address] [place] [address] [phone]       Follow up with [facility] On 10/13/2015.   Why:  Please get CXR at 10:00   Contact information:   [place]       Follow up with Reyes Ivan, MD On 09/24/2015.   Specialty:  General Surgery   Why:  arrive by 11:10AM fora 11:40AM post op check   Contact information:   [place] [address] [phone]       Signed: Nani Skillern  PA-C 09/09/2015, 11:53 AM

## 2015-09-07 NOTE — Progress Notes (Signed)
Patient sitting up in bed, no needs at this time. Call light within reach.  

## 2015-09-07 NOTE — Progress Notes (Signed)
Spoke with Dr Roxy Manns. Pt unable to be given any type of sedative. Also advised that he had nothing for pain ordered at this time. Gave verbal order with read back for Tylenol. Will advise patient.

## 2015-09-07 NOTE — Progress Notes (Signed)
PARENTERAL NUTRITION CONSULT NOTE - FOLLOW UP  Pharmacy Consult for TPN Indication: Ileus  Allergies  Allergen Reactions  . Atorvastatin Anaphylaxis    LIPITOR -  facial swelling    Patient Measurements: Height: 5\' 6"  (167.6 cm) Weight: 222 lb 14.2 oz (101.1 kg) IBW/kg (Calculated) : 63.8  Intake/Output from previous day: 05/22 0701 - 05/23 0700 In: 1320 [P.O.:1320] Out: 3615 [Urine:3615] Intake/Output from this shift:    Labs: No results for input(s): WBC, HGB, HCT, PLT, APTT, INR in the last 72 hours.   Recent Labs  09/05/15 0510 09/06/15 0520  NA 130* 128*  K 4.0 4.2  CL 99* 97*  CO2 23 21*  GLUCOSE 111* 114*  BUN 22* 22*  CREATININE 0.82 0.88  CALCIUM 7.8* 7.9*  MG  --  2.2  PHOS  --  3.9  PROT  --  5.8*  ALBUMIN  --  2.2*  AST  --  40  ALT  --  23  ALKPHOS  --  56  BILITOT  --  0.6  PREALBUMIN  --  8.0*  TRIG  --  64   Estimated Creatinine Clearance: 85.7 mL/min (by C-G formula based on Cr of 0.88).    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 Now s/p ex-lap with R-hemicolectomy. Has a history of ileus that resolved after a couple days s/p prior surgery.  GI: ileus dilated cecum to 15 cm. 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 Abd X-ray on 5/18- persistent ileus. NG clamped 5/20. On PPI IV, Reglan IV q6h, Docusate 200mg  per tube qday, bisacodyl tab or supp 10mg  qday. Last BM 5/22. Did not endorse any issues tolerating clear liquid diet to me yesterday. Prealbumin remains low at 8, trigs in range at 64.  Endo: No hx DM. CBGs controlled (105-131)  Levemir 20 units BID.    Lytes: K 4.3 (goal > 4) on KCL 7mEq daily. Mag 2.2(goal > 2), phos in range at 3.5, CorCa ~9.5, Na remains low at 131  Renal: SCr stable - 0.86, CrCl ~80-83ml/min. UOP 1.59ml/kg/hr  Pulm: Extubated on 5/11> RA. Pulmicort nebs  Cards: S/P CABG 08/20/15. VSS (Afib) Amio gtt at 30mg /hr, Lasix 40mg  IV qday. PRN Lopressor  Hepatobil: LFTs/Tbili wnl, TG wnl  Neuro: hx EtOH abuse. On Fent patch for pain control, trying to avoid PO opioids per CVTS  ID: Afebrile, WBC wnl, no antibiotics  Best Practices: Lovenox  TPN Access: PICC placed 5/8 TPN start date: 08/25/15  Nutritional Goals: per RD recs on 5/17 Kcal: 2100-2300  Protein: 115-125 g  Current Nutrition:  -Clears -Clinimix E 5/15 at 180ml/hr + lipid emulsions at 99ml/hr> provides 120 g of protein and 2184 kCals per day which meets 100% of nutritional goals   Plan:  -Wean TPN- decrease Clinimix E 5/15 to 18mL/hr and continue 20% lipid emulsion at 71ml/hr until both bags expire at 1759 this evening. Plan discussed with RN Sonia Baller -TPN labs discontinued -pharmacy to sign off as TPN weaned to off per discussion with Dr. Ninfa Linden- please reconsult if needed.  Jaonna Word D. Toula Miyasaki, PharmD, BCPS Clinical Pharmacist Pager: [phone] 09/07/2015  8:08 AM

## 2015-09-07 NOTE — Progress Notes (Signed)
13 Days Post-Op  Subjective: Tolerating po Had another BM  Objective: Vital signs in last 24 hours: Temp:  [98 F (36.7 C)-98.4 F (36.9 C)] 98 F (36.7 C) (05/23 0421) Pulse Rate:  [86-94] 89 (05/23 0421) Resp:  [16-18] 18 (05/23 0421) BP: (126-158)/(54-69) 136/54 mmHg (05/23 0421) SpO2:  [96 %-100 %] 96 % (05/23 0421) Weight:  [101.1 kg (222 lb 14.2 oz)] 101.1 kg (222 lb 14.2 oz) (05/23 0418) Last BM Date: 09/06/15  Intake/Output from previous day: 05/22 0701 - 05/23 0700 In: 1320 [P.O.:1320] Out: 3615 [Urine:3615] Intake/Output this shift: Total I/O In: 240 [P.O.:240] Out: 400 [Urine:400]  Abdomen soft, wound ok with one area of mild erythema.  Staples intact  Lab Results:  No results for input(s): WBC, HGB, HCT, PLT in the last 72 hours. BMET  Recent Labs  09/05/15 0510 09/06/15 0520  NA 130* 128*  K 4.0 4.2  CL 99* 97*  CO2 23 21*  GLUCOSE 111* 114*  BUN 22* 22*  CREATININE 0.82 0.88  CALCIUM 7.8* 7.9*   PT/INR No results for input(s): LABPROT, INR in the last 72 hours. ABG No results for input(s): PHART, HCO3 in the last 72 hours.  Invalid input(s): PCO2, PO2  Studies/Results: No results found.  Anti-infectives: Anti-infectives    Start     Dose/Rate Route Frequency Ordered Stop   08/26/15 1000  cefoTEtan (CEFOTAN) 1 g in dextrose 5 % 50 mL IVPB     1 g 100 mL/hr over 30 Minutes Intravenous  Once 08/26/15 0853 08/26/15 1057   08/25/15 1115  cefoTEtan in Dextrose 5% (CEFOTAN) IVPB 2 g     2 g Intravenous  Once 08/25/15 1111 08/25/15 1150   08/25/15 1113  cefoTEtan in Dextrose 5% (CEFOTAN) 2-2.08 GM-% IVPB    Comments:  [name]   : cabinet override      08/25/15 1113 08/25/15 2329   08/21/15 0400  cefUROXime (ZINACEF) 1.5 g in dextrose 5 % 50 mL IVPB     1.5 g 100 mL/hr over 30 Minutes Intravenous Every 12 hours 08/20/15 1742 08/22/15 1630   08/20/15 2330  vancomycin (VANCOCIN) IVPB 1000 mg/200 mL premix     1,000 mg 200 mL/hr over  60 Minutes Intravenous Every 12 hours 08/20/15 1827 08/21/15 2330   08/20/15 2315  vancomycin (VANCOCIN) IVPB 1000 mg/200 mL premix  Status:  Discontinued     1,000 mg 200 mL/hr over 60 Minutes Intravenous  Once 08/20/15 1742 08/20/15 1827   08/20/15 0400  vancomycin (VANCOCIN) 1,500 mg in sodium chloride 0.9 % 250 mL IVPB  Status:  Discontinued     1,500 mg 125 mL/hr over 120 Minutes Intravenous To Surgery 08/19/15 1605 08/20/15 1523   08/20/15 0400  cefUROXime (ZINACEF) 1.5 g in dextrose 5 % 50 mL IVPB  Status:  Discontinued     1.5 g 100 mL/hr over 30 Minutes Intravenous To Surgery 08/19/15 1605 08/20/15 1523   08/20/15 0400  cefUROXime (ZINACEF) 750 mg in dextrose 5 % 50 mL IVPB  Status:  Discontinued     750 mg 100 mL/hr over 30 Minutes Intravenous To Surgery 08/19/15 1605 08/20/15 1523      Assessment/Plan: s/p Procedure(s): EXPLORATORY LAPAROTOMY (N/A) ASCENDING AND TRANSVERSE COLECTOMY (N/A)  Advance diet  Ween TNA off  LOS: 19 days    Krysten Veronica A 09/07/2015

## 2015-09-07 NOTE — Progress Notes (Addendum)
Nutrition Follow Up  DOCUMENTATION CODES:   Obesity unspecified  INTERVENTION:    Continue Heart Healthy/Carbohydrate Modified diet  NUTRITION DIAGNOSIS:   Inadequate oral intake related to altered GI function as evidenced by NPO status, resolved  GOAL:   Patient will meet greater than or equal to 90% of their needs, met  MONITOR:   PO intake, Labs, Weight trends, Skin, I & O's,   ASSESSMENT:   70 yo M POD 5 s/p 3 vessel CABG for CAD who over the past two days has noticed increasing abdominal distention. He states that he has not had passage of flatus in two days. Yesterday, imaging of the abdomen demonstrated an enlarged colon with distention in the cecum to 15cm. GI was consulted and attempt at placement of a rectal tube was done. Minimal stool was evacuated. Today on imaging, persistent dilation was noted to 15cm and Surgery was consulted over concern for possible volvulus.  Patient s/p procedures 5/10: EXPLORATORY LAPAROTOMY  RIGHT HEMI-COLECTOMY WITH ANASTAMOSIS  NGT discontinued >> advanced to solids today. PO intake 75% per flowsheet records >> having BM's. TPN to discontinue tonight. CWOCN note reviewed >> MASD to gluteal fold.  Diet Order:  .TPN (CLINIMIX-E) Adult Diet heart healthy/carb modified Room service appropriate?: Yes; Fluid consistency:: Thin  Skin:  MASD to gluteal fold  Last BM:  5/23  Height:   Ht Readings from Last 1 Encounters:  08/19/15 _0  (1.676 m)    Weight:   Wt Readings from Last 1 Encounters:  09/07/15 222 lb 14.2 oz (101.1 kg)    Ideal Body Weight:  64.5 kg  BMI:  Body mass index is 35.99 kg/(m^2).  Estimated Nutritional Needs:   Kcal:  2100-2300  Protein:  115-125 gm  Fluid:  per MD  EDUCATION NEEDS:   No education needs identified at this time  Arthur Holms, RD, LDN Pager #: [phone] After-Hours Pager #: [phone]

## 2015-09-07 NOTE — Consult Note (Signed)
WOC wound consult note Reason for Consult: ? DTI Wound type: MASD (moisture associated skin damage) Patient report frequent BM and friction with rubbing with wash cloths is when area started to bother him Pressure Ulcer POA: No Measurement: two small areas in the gluteal fold, red, blanches  Wound bed: intact skin Drainage (amount, consistency, odor) none Periwound: intact  Dressing procedure/placement/frequency: Add barrier cream for protection from stool.  Discussed POC with patient and bedside nurse.  Re consult if needed, will not follow at this time. Thanks  Rafe Mackowski [place], [place] [phone])

## 2015-09-07 NOTE — Progress Notes (Signed)
Rehab admissions - Patient is doing well with therapies.  Ambulated 350 feet with cardiac rehab.  Once medically ready, may not need an inpatient rehab stay.  Call me for questions.  [phone]

## 2015-09-07 NOTE — Progress Notes (Signed)
CARDIAC REHAB PHASE I   PRE:  Rate/Rhythm: 94 SR  BP:  Supine:   Sitting: 144/67  Standing:    SaO2: 99%RA  MODE:  Ambulation: 250 ft   POST:  Rate/Rhythm: 112 ST  BP:  Supine:   Sitting: 156/70  Standing:    SaO2: 97%RA 1050-1117 Second walk today. Pt walked 250 ft on RA with rollator and minimal asst. Tolerated well. To recliner after walk. Very motivated.   Graylon Good, RN BSN  09/07/2015 11:14 AM

## 2015-09-07 NOTE — Progress Notes (Addendum)
      [address]       [place] [address]             [phone]        13 Days Post-Op Procedure(s) (LRB): EXPLORATORY LAPAROTOMY (N/A) ASCENDING AND TRANSVERSE COLECTOMY (N/A)  Subjective: Patient with trouble sleeping. He denies nausea or vomiting. He is passing flatus.  Objective: Vital signs in last 24 hours: Temp:  [98 F (36.7 C)-98.4 F (36.9 C)] 98 F (36.7 C) (05/23 0421) Pulse Rate:  [86-94] 89 (05/23 0421) Cardiac Rhythm:  [-] Normal sinus rhythm (05/22 1907) Resp:  [16-18] 18 (05/23 0421) BP: (126-158)/(54-69) 136/54 mmHg (05/23 0421) SpO2:  [96 %-100 %] 96 % (05/23 0421) Weight:  [222 lb 14.2 oz (101.1 kg)] 222 lb 14.2 oz (101.1 kg) (05/23 0418)  Pre op weight  90 kg Current Weight  09/07/15 222 lb 14.2 oz (101.1 kg)       Intake/Output from previous day: 05/22 0701 - 05/23 0700 In: 1320 [P.O.:1320] Out: 3615 [Urine:3615]   Physical Exam:  Cardiovascular: RRR Pulmonary: Clear to auscultation bilaterally; no rales, wheezes, or rhonchi. Abdomen: Soft, non tender, bowel sounds present. Dressing is dry and intact. Extremities: Bilateral lower extremity edema. Ecchymosis right thigh and LE Wounds: Clean and dry.  No erythema or signs of infection.  Lab Results: CBC:No results for input(s): WBC, HGB, HCT, PLT in the last 72 hours. BMET:  Recent Labs  09/05/15 0510 09/06/15 0520  NA 130* 128*  K 4.0 4.2  CL 99* 97*  CO2 23 21*  GLUCOSE 111* 114*  BUN 22* 22*  CREATININE 0.82 0.88  CALCIUM 7.8* 7.9*    PT/INR:  Lab Results  Component Value Date   INR 1.29 08/25/2015   INR 1.67* 08/20/2015   INR 1.09 08/19/2015   ABG:  INR: Will add last result for INR, ABG once components are confirmed Will add last 4 CBG results once components are confirmed  Assessment/Plan:  1. CV - SR in the 90's. On Amiodarone 200 mg bid. 2.  Pulmonary - On room air. Encourage incentive spirometer. 3. Volume Overload - On Lasix 20 mg IV.  Will give 40 mg IV as at least 10 pounds above pre op weight. 4.  GI-Tolerating clear liquids. Diet per general surgery. 5. CBGs 115/119/105. Pre op HGA1C 5.6. On TPN so will stop accu checks, Insulin once tolerating a diet. 6. Regarding pain control, has Tylenol only. Will discuss with Dr. Prescott Gum as want to avoid narcotics as much as possible with recent ex lap. 7. Will need CIR or SNF when ready for discharge  ZIMMERMAN,DONIELLE MPA-C 09/07/2015,7:37 AM  Resume ulram for pain Encourage ambulation Keep on stepdown unit  patient examined and medical record reviewed,agree with above note. Tharon Aquas Trigt III 09/07/2015

## 2015-09-08 LAB — GLUCOSE, CAPILLARY
GLUCOSE-CAPILLARY: 87 mg/dL (ref 65–99)
Glucose-Capillary: 103 mg/dL — ABNORMAL HIGH (ref 65–99)

## 2015-09-08 MED ORDER — ASPIRIN EC 325 MG PO TBEC
325.0000 mg | DELAYED_RELEASE_TABLET | Freq: Every day | ORAL | Status: DC
  Administered 2015-09-08 – 2015-09-09 (×2): 325 mg via ORAL
  Filled 2015-09-08 (×2): qty 1

## 2015-09-08 MED ORDER — METOPROLOL TARTRATE 12.5 MG HALF TABLET
12.5000 mg | ORAL_TABLET | Freq: Two times a day (BID) | ORAL | Status: DC
  Administered 2015-09-08 (×2): 12.5 mg via ORAL
  Filled 2015-09-08 (×2): qty 1

## 2015-09-08 MED ORDER — TRAMADOL HCL 50 MG PO TABS
50.0000 mg | ORAL_TABLET | Freq: Four times a day (QID) | ORAL | Status: DC | PRN
  Administered 2015-09-08 – 2015-09-09 (×2): 50 mg via ORAL
  Filled 2015-09-08 (×2): qty 1

## 2015-09-08 NOTE — Progress Notes (Signed)
Pt assisted to ambulate 300 ft using rolator.  Verbal cues for sternal precautions.  Wants to put pressure on left hand when getting up from chair.  HR to low 100s during walk.  Steady gait, no rests.  To recliner with call bell in reach.  Will con't plan of care.

## 2015-09-08 NOTE — Progress Notes (Signed)
Patient ID: Victor Diaz, male   DOB: [birth year], 70 y.o.   MRN: [id number]     [address] [place], [place] [address]    Phone: [phone] FAX: [phone]     Subjective: No n/v.  Tolerating POs.  Had 3 BMs. Afebrile. Pain controlled.   Objective:  Vital signs:  Filed Vitals:   09/07/15 1956 09/07/15 2200 09/08/15 0436 09/08/15 0827  BP:  139/61 123/56   Pulse:  97 87   Temp:  99.3 F (37.4 C) 98.7 F (37.1 C)   TempSrc:  Oral Oral   Resp:  18 16   Height:      Weight:   97.705 kg (215 lb 6.4 oz)   SpO2: 98% 97% 96% 97%    Last BM Date: 09/08/15  Intake/Output   Yesterday:  05/23 0701 - 05/24 0700 In: 720 [P.O.:720] Out: 3350 [Urine:3350] This shift:  Total I/O In: 240 [P.O.:240] Out: 450 [Urine:450]   Physical Exam: General: Pt awake/alert/oriented x4 in no acute distress  Abdomen: Soft.  Nondistended.  Midline wound with staples widely placed, no erythema.  No evidence of peritonitis.  No incarcerated hernias.    Problem List:   Active Problems:   Abnormal myocardial perfusion study   Unstable angina (HCC)   S/P CABG x 4   Acute blood loss anemia   Acute systolic congestive heart failure (HCC)   Coronary artery disease involving native coronary artery of native heart without angina pectoris   Essential hypertension   Obesity   Paroxysmal atrial fibrillation (HCC)   Subclavian artery stenosis (HCC)   Tachycardia   Tachypnea   Pressure ulcer    Results:   Labs: Results for orders placed or performed during the hospital encounter of 08/19/15 (from the past 48 hour(s))  Glucose, capillary     Status: Abnormal   Collection Time: 09/06/15 12:28 PM  Result Value Ref Range   Glucose-Capillary 131 (H) 65 - 99 mg/dL  Glucose, capillary     Status: Abnormal   Collection Time: 09/06/15  5:50 PM  Result Value Ref Range   Glucose-Capillary 115 (H) 65 - 99 mg/dL   Comment 1  Notify RN    Comment 2 Document in Chart   Glucose, capillary     Status: Abnormal   Collection Time: 09/07/15 12:04 AM  Result Value Ref Range   Glucose-Capillary 119 (H) 65 - 99 mg/dL   Comment 1 Notify RN    Comment 2 Document in Chart   Glucose, capillary     Status: Abnormal   Collection Time: 09/07/15  5:57 AM  Result Value Ref Range   Glucose-Capillary 105 (H) 65 - 99 mg/dL   Comment 1 Notify RN    Comment 2 Document in Chart   Basic metabolic panel     Status: Abnormal   Collection Time: 09/07/15  8:25 AM  Result Value Ref Range   Sodium 131 (L) 135 - 145 mmol/L   Potassium 4.3 3.5 - 5.1 mmol/L   Chloride 98 (L) 101 - 111 mmol/L   CO2 24 22 - 32 mmol/L   Glucose, Bld 115 (H) 65 - 99 mg/dL   BUN 19 6 - 20 mg/dL   Creatinine, Ser 0.86 0.61 - 1.24 mg/dL   Calcium 8.2 (L) 8.9 - 10.3 mg/dL   GFR calc non Af Amer >60 >60 mL/min   GFR calc Af Amer >60 >60 mL/min  Comment: (NOTE) The eGFR has been calculated using the CKD EPI equation. This calculation has not been validated in all clinical situations. eGFR's persistently <60 mL/min signify possible Chronic Kidney Disease.    Anion gap 9 5 - 15  Magnesium     Status: None   Collection Time: 09/07/15  8:25 AM  Result Value Ref Range   Magnesium 2.2 1.7 - 2.4 mg/dL  Phosphorus     Status: None   Collection Time: 09/07/15  8:25 AM  Result Value Ref Range   Phosphorus 3.5 2.5 - 4.6 mg/dL  Glucose, capillary     Status: Abnormal   Collection Time: 09/07/15  1:03 PM  Result Value Ref Range   Glucose-Capillary 177 (H) 65 - 99 mg/dL   Comment 1 Notify RN    Comment 2 Document in Chart   Glucose, capillary     Status: Abnormal   Collection Time: 09/07/15  5:56 PM  Result Value Ref Range   Glucose-Capillary 114 (H) 65 - 99 mg/dL  Glucose, capillary     Status: None   Collection Time: 09/08/15  1:41 AM  Result Value Ref Range   Glucose-Capillary 87 65 - 99 mg/dL  Glucose, capillary     Status: Abnormal   Collection  Time: 09/08/15  6:14 AM  Result Value Ref Range   Glucose-Capillary 103 (H) 65 - 99 mg/dL    Imaging / Studies: No results found.  Medications / Allergies:  Scheduled Meds: . amiodarone  200 mg Oral BID  . antiseptic oral rinse  7 mL Mouth Rinse q12n4p  . bisacodyl  10 mg Oral Daily   Or  . bisacodyl  10 mg Rectal Daily  . budesonide (PULMICORT) nebulizer solution  0.5 mg Nebulization BID  . chlorhexidine  15 mL Mouth Rinse BID  . docusate sodium  200 mg Oral Daily  . enoxaparin (LOVENOX) injection  40 mg Subcutaneous Q24H  . furosemide  40 mg Intravenous Daily  . LORazepam  0.5 mg Oral QHS  . metoCLOPramide (REGLAN) injection  10 mg Intravenous Q6H  . pantoprazole  40 mg Oral Daily  . potassium chloride  20 mEq Oral Daily  . sodium chloride flush  10-40 mL Intracatheter Q12H   Continuous Infusions:  PRN Meds:.acetaminophen, ipratropium-albuterol, metoprolol, ondansetron (ZOFRAN) IV, sodium chloride flush, traMADol  Antibiotics: Anti-infectives    Start     Dose/Rate Route Frequency Ordered Stop   08/26/15 1000  cefoTEtan (CEFOTAN) 1 g in dextrose 5 % 50 mL IVPB     1 g 100 mL/hr over 30 Minutes Intravenous  Once 08/26/15 0853 08/26/15 1057   08/25/15 1115  cefoTEtan in Dextrose 5% (CEFOTAN) IVPB 2 g     2 g Intravenous  Once 08/25/15 1111 08/25/15 1150   08/25/15 1113  cefoTEtan in Dextrose 5% (CEFOTAN) 2-2.08 GM-% IVPB    Comments:  [name]   : cabinet override      08/25/15 1113 08/25/15 2329   08/21/15 0400  cefUROXime (ZINACEF) 1.5 g in dextrose 5 % 50 mL IVPB     1.5 g 100 mL/hr over 30 Minutes Intravenous Every 12 hours 08/20/15 1742 08/22/15 1630   08/20/15 2330  vancomycin (VANCOCIN) IVPB 1000 mg/200 mL premix     1,000 mg 200 mL/hr over 60 Minutes Intravenous Every 12 hours 08/20/15 1827 08/21/15 2330   08/20/15 2315  vancomycin (VANCOCIN) IVPB 1000 mg/200 mL premix  Status:  Discontinued     1,000 mg 200 mL/hr over 60  Minutes Intravenous  Once  08/20/15 1742 08/20/15 1827   08/20/15 0400  vancomycin (VANCOCIN) 1,500 mg in sodium chloride 0.9 % 250 mL IVPB  Status:  Discontinued     1,500 mg 125 mL/hr over 120 Minutes Intravenous To Surgery 08/19/15 1605 08/20/15 1523   08/20/15 0400  cefUROXime (ZINACEF) 1.5 g in dextrose 5 % 50 mL IVPB  Status:  Discontinued     1.5 g 100 mL/hr over 30 Minutes Intravenous To Surgery 08/19/15 1605 08/20/15 1523   08/20/15 0400  cefUROXime (ZINACEF) 750 mg in dextrose 5 % 50 mL IVPB  Status:  Discontinued     750 mg 100 mL/hr over 30 Minutes Intravenous To Surgery 08/19/15 1605 08/20/15 1523        Assessment/Plan 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 08/25/15 -DC staples, continue mobilization, heart healthy diet, encourage PO pain meds PRN. -stable for DC from a surgical standpoint, CIR v home  Erby Pian, [facility] Surgery Pager [phone]) For consults and floor pages call [phone]([id number]:30P)  09/08/2015 10:52 AM

## 2015-09-08 NOTE — Care Management Note (Addendum)
Case Management Note Marvetta Gibbons RN, BSN Unit 2W-Case Manager [phone]  Patient Details  Name: Victor Diaz MRN: [id number] Date of Birth: 16-Apr-1946  Subjective/Objective:    Pt admitted s/p CABG with post op ileus then s/p exp lap with colectomy- prolonged ileus-                Action/Plan: PTA pt lived at home with family- anticipate SNF vs home with [facility] pt progressing well with mobility and PT. CSW following for possible placement in STSNF, CIR consulted however pt may progress past needing CIR. CM to follow for possible home with [facility].   Expected Discharge Date:                  Expected Discharge Plan:  Skilled Nursing Facility  In-House Referral:  Clinical Social Work  Discharge planning Services  CM Consult  Post Acute Care Choice:    Choice offered to:     DME Arranged:    DME Agency:     HH Arranged:    [place] Agency:     Status of Service:  In process, will continue to follow  Medicare Important Message Given:  Yes Date Medicare IM Given:    Medicare IM give by:    Date Additional Medicare IM Given:    Additional Medicare Important Message give by:     If discussed at [place] of Stay Meetings, dates discussed:    Additional Comments:  Dawayne Patricia, RN 09/08/2015, 2:40 PM

## 2015-09-08 NOTE — Progress Notes (Addendum)
      [address]       [place] [address]             [phone]        14 Days Post-Op Procedure(s) (LRB): EXPLORATORY LAPAROTOMY (N/A) ASCENDING AND TRANSVERSE COLECTOMY (N/A)  Subjective: Patient tolerating diet and had a bowel movement yesterday and this morning.  Objective: Vital signs in last 24 hours: Temp:  [98.7 F (37.1 C)-99.3 F (37.4 C)] 98.7 F (37.1 C) (05/24 0436) Pulse Rate:  [87-97] 87 (05/24 0436) Cardiac Rhythm:  [-] Normal sinus rhythm (05/23 1935) Resp:  [16-18] 16 (05/24 0436) BP: (123-139)/(56-64) 123/56 mmHg (05/24 0436) SpO2:  [96 %-99 %] 96 % (05/24 0436) Weight:  [215 lb 6.4 oz (97.705 kg)] 215 lb 6.4 oz (97.705 kg) (05/24 0436)  Pre op weight  90 kg Current Weight  09/08/15 215 lb 6.4 oz (97.705 kg)       Intake/Output from previous day: 05/23 0701 - 05/24 0700 In: 720 [P.O.:720] Out: 3350 [Urine:3350]   Physical Exam:  Cardiovascular: RRR Pulmonary: Clear to auscultation bilaterally; no rales, wheezes, or rhonchi. Abdomen: Soft, non tender, bowel sounds present. Dressing is dry and intact. Extremities: Bilateral lower extremity edema. Ecchymosis right thigh and LE Wounds: Sternal wound is clean and dry.  No erythema or signs of infection. Abdominal wound with slight erythema proximally. Staples intact.  Lab Results: CBC:No results for input(s): WBC, HGB, HCT, PLT in the last 72 hours. BMET:   Recent Labs  09/06/15 0520 09/07/15 0825  NA 128* 131*  K 4.2 4.3  CL 97* 98*  CO2 21* 24  GLUCOSE 114* 115*  BUN 22* 19  CREATININE 0.88 0.86  CALCIUM 7.9* 8.2*    PT/INR:  Lab Results  Component Value Date   INR 1.29 08/25/2015   INR 1.67* 08/20/2015   INR 1.09 08/19/2015   ABG:  INR: Will add last result for INR, ABG once components are confirmed Will add last 4 CBG results once components are confirmed  Assessment/Plan:  1. CV - SR in the 90's. On Amiodarone 200 mg bid. 2.  Pulmonary - On room  air. Encourage incentive spirometer. 3. Volume Overload - On Lasix 20 mg IV. Will give 40 mg IV again as had good diuresis yesterday with this. 4.  GI-Tolerating heart healthy/carb modified diet. 5. CBGs 114/87/103. Pre op HGA1C 5.6. 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 He is NOT on TPN so will stop accu checks, Insulin as  tolerating a diet. 6. Hyponatremia-sodium up to 131. Likely related to diuresis. 7. Will discuss with Dr. Prescott Gum restarting ecasa, statin, and low dose BB as tolerating diet. 8. Will need CIR or SNF when ready for discharge and will discuss with Dr. Prescott Gum timing of discharge  ZIMMERMAN,DONIELLE MPA-C 09/08/2015,7:36 AM  Resume po meds- asa, metoprolol, hold crestor patient examined and medical record reviewed,agree with above note. Tharon Aquas Trigt III 09/08/2015

## 2015-09-08 NOTE — Care Management Important Message (Signed)
Important Message  Patient Details  Name: Victor Diaz MRN: [id number] Date of Birth: 07-Dec-1945   Medicare Important Message Given:  Yes    [name] 09/08/2015, 11:16 AM

## 2015-09-08 NOTE — Progress Notes (Signed)
CARDIAC REHAB PHASE I   PRE:  Rate/Rhythm: 88 SR  BP:  Sitting: 132/65        SaO2: 98 RA  MODE:  Ambulation: 250 ft   POST:  Rate/Rhythm: 103 ST  BP:  Sitting: 160/71         SaO2: 100 RA  Pt up in recliner, states he is tired from not sleeping last night. Pt agreeable to walk. Pt required cues for sternal precautions with standing. Pt ambulated 250 ft on RA, IV, rollator, assist x1, steady gait, tolerated well. Pt c/o feeling "a little short of breath," denies any other complaints, declined rest stop. Pt unsure as to discharge disposition, states he would like to have his son present for discharge education if possible. Encouraged ambulation x2 more today, IS. Pt to recliner after walk, feet elevated, call bell within reach, will follow.    [id number] Lenna Sciara, RN, BSN 09/08/2015 10:25 AM

## 2015-09-08 NOTE — Progress Notes (Signed)
Utilization review completed.  

## 2015-09-09 ENCOUNTER — Inpatient Hospital Stay (HOSPITAL_COMMUNITY)
Admission: RE | Admit: 2015-09-09 | Discharge: 2015-09-15 | DRG: 945 | Disposition: A | Discharge status: Home Health | Payer: Medicare Other | Source: Intra-hospital | Attending: Physical Medicine & Rehabilitation | Admitting: Physical Medicine & Rehabilitation

## 2015-09-09 DIAGNOSIS — Z87891 Personal history of nicotine dependence: Secondary | ICD-10-CM

## 2015-09-09 DIAGNOSIS — F1010 Alcohol abuse, uncomplicated: Secondary | ICD-10-CM | POA: Diagnosis present

## 2015-09-09 DIAGNOSIS — Z951 Presence of aortocoronary bypass graft: Secondary | ICD-10-CM | POA: Diagnosis not present

## 2015-09-09 DIAGNOSIS — E669 Obesity, unspecified: Secondary | ICD-10-CM | POA: Diagnosis present

## 2015-09-09 DIAGNOSIS — E785 Hyperlipidemia, unspecified: Secondary | ICD-10-CM | POA: Diagnosis present

## 2015-09-09 DIAGNOSIS — Z7982 Long term (current) use of aspirin: Secondary | ICD-10-CM

## 2015-09-09 DIAGNOSIS — Z95820 Peripheral vascular angioplasty status with implants and grafts: Secondary | ICD-10-CM

## 2015-09-09 DIAGNOSIS — D62 Acute posthemorrhagic anemia: Secondary | ICD-10-CM | POA: Diagnosis present

## 2015-09-09 DIAGNOSIS — I2510 Atherosclerotic heart disease of native coronary artery without angina pectoris: Secondary | ICD-10-CM | POA: Diagnosis present

## 2015-09-09 DIAGNOSIS — R29898 Other symptoms and signs involving the musculoskeletal system: Secondary | ICD-10-CM

## 2015-09-09 DIAGNOSIS — R531 Weakness: Principal | ICD-10-CM | POA: Diagnosis present

## 2015-09-09 DIAGNOSIS — I480 Paroxysmal atrial fibrillation: Secondary | ICD-10-CM | POA: Diagnosis present

## 2015-09-09 DIAGNOSIS — N179 Acute kidney failure, unspecified: Secondary | ICD-10-CM | POA: Diagnosis not present

## 2015-09-09 DIAGNOSIS — Z96642 Presence of left artificial hip joint: Secondary | ICD-10-CM | POA: Diagnosis present

## 2015-09-09 DIAGNOSIS — E871 Hypo-osmolality and hyponatremia: Secondary | ICD-10-CM | POA: Diagnosis present

## 2015-09-09 DIAGNOSIS — I10 Essential (primary) hypertension: Secondary | ICD-10-CM | POA: Diagnosis present

## 2015-09-09 DIAGNOSIS — Z6831 Body mass index (BMI) 31.0-31.9, adult: Secondary | ICD-10-CM

## 2015-09-09 DIAGNOSIS — Z888 Allergy status to other drugs, medicaments and biological substances status: Secondary | ICD-10-CM | POA: Diagnosis not present

## 2015-09-09 DIAGNOSIS — Z79899 Other long term (current) drug therapy: Secondary | ICD-10-CM | POA: Diagnosis not present

## 2015-09-09 DIAGNOSIS — R5381 Other malaise: Secondary | ICD-10-CM | POA: Diagnosis not present

## 2015-09-09 LAB — CBC
HEMATOCRIT: 29.2 % — AB (ref 39.0–52.0)
HEMOGLOBIN: 9.3 g/dL — AB (ref 13.0–17.0)
MCH: 30 pg (ref 26.0–34.0)
MCHC: 31.8 g/dL (ref 30.0–36.0)
MCV: 94.2 fL (ref 78.0–100.0)
Platelets: 254 10*3/uL (ref 150–400)
RBC: 3.1 MIL/uL — AB (ref 4.22–5.81)
RDW: 15.3 % (ref 11.5–15.5)
WBC: 5.3 10*3/uL (ref 4.0–10.5)

## 2015-09-09 LAB — CREATININE, SERUM: Creatinine, Ser: 1.12 mg/dL (ref 0.61–1.24)

## 2015-09-09 MED ORDER — ASPIRIN 325 MG PO TABS: 325.0000 mg | ORAL_TABLET | Freq: Every day | ORAL | Status: AC

## 2015-09-09 MED ORDER — ENOXAPARIN SODIUM 40 MG/0.4ML ~~LOC~~ SOLN: 40.0000 mg | SUBCUTANEOUS | Status: DC

## 2015-09-09 MED ORDER — BISACODYL 10 MG RE SUPP
10.0000 mg | Freq: Every day | RECTAL | Status: DC
Start: 2015-09-10 — End: 2015-09-15
  Filled 2015-09-09: qty 1

## 2015-09-09 MED ORDER — POTASSIUM CHLORIDE CRYS ER 20 MEQ PO TBCR: 20.0000 meq | EXTENDED_RELEASE_TABLET | Freq: Two times a day (BID) | ORAL | Status: DC

## 2015-09-09 MED ORDER — METOPROLOL SUCCINATE ER 25 MG PO TB24
25.0000 mg | ORAL_TABLET | Freq: Every day | ORAL | Status: DC
  Administered 2015-09-09: 25 mg via ORAL
  Filled 2015-09-09: qty 1

## 2015-09-09 MED ORDER — ONDANSETRON HCL 4 MG/2ML IJ SOLN: 4.0000 mg | Freq: Four times a day (QID) | INTRAMUSCULAR | Status: DC | PRN

## 2015-09-09 MED ORDER — LORAZEPAM 0.5 MG PO TABS
0.5000 mg | ORAL_TABLET | Freq: Every day | ORAL | Status: DC
  Administered 2015-09-09 – 2015-09-14 (×6): 0.5 mg via ORAL
  Filled 2015-09-09 (×6): qty 1

## 2015-09-09 MED ORDER — PANTOPRAZOLE SODIUM 40 MG PO TBEC: 40.0000 mg | DELAYED_RELEASE_TABLET | Freq: Every day | ORAL | Status: DC

## 2015-09-09 MED ORDER — FUROSEMIDE 40 MG PO TABS
40.0000 mg | ORAL_TABLET | Freq: Two times a day (BID) | ORAL | Status: DC
  Administered 2015-09-09 – 2015-09-14 (×10): 40 mg via ORAL
  Filled 2015-09-09 (×10): qty 1

## 2015-09-09 MED ORDER — PANTOPRAZOLE SODIUM 40 MG PO TBEC
40.0000 mg | DELAYED_RELEASE_TABLET | Freq: Every day | ORAL | Status: DC
  Administered 2015-09-09 – 2015-09-14 (×6): 40 mg via ORAL
  Filled 2015-09-09 (×6): qty 1

## 2015-09-09 MED ORDER — IPRATROPIUM-ALBUTEROL 0.5-2.5 (3) MG/3ML IN SOLN: 3.0000 mL | Freq: Four times a day (QID) | RESPIRATORY_TRACT | Status: DC | PRN

## 2015-09-09 MED ORDER — ASPIRIN EC 325 MG PO TBEC
325.0000 mg | DELAYED_RELEASE_TABLET | Freq: Every day | ORAL | Status: DC
  Administered 2015-09-10 – 2015-09-15 (×6): 325 mg via ORAL
  Filled 2015-09-09 (×6): qty 1

## 2015-09-09 MED ORDER — AMIODARONE HCL 200 MG PO TABS
200.0000 mg | ORAL_TABLET | Freq: Every day | ORAL | Status: DC
Start: 2015-09-10 — End: 2015-09-15

## 2015-09-09 MED ORDER — LORAZEPAM 0.5 MG PO TABS: 0.5000 mg | ORAL_TABLET | Freq: Every day | ORAL | Status: DC

## 2015-09-09 MED ORDER — METOPROLOL SUCCINATE ER 25 MG PO TB24
25.0000 mg | ORAL_TABLET | Freq: Every day | ORAL | Status: DC
  Administered 2015-09-10 – 2015-09-15 (×6): 25 mg via ORAL
  Filled 2015-09-09 (×6): qty 1

## 2015-09-09 MED ORDER — SORBITOL 70 % SOLN: 30.0000 mL | Freq: Every day | Status: DC | PRN

## 2015-09-09 MED ORDER — POTASSIUM CHLORIDE CRYS ER 20 MEQ PO TBCR
20.0000 meq | EXTENDED_RELEASE_TABLET | Freq: Two times a day (BID) | ORAL | Status: DC
  Administered 2015-09-09 – 2015-09-15 (×12): 20 meq via ORAL
  Filled 2015-09-09 (×12): qty 1

## 2015-09-09 MED ORDER — METOCLOPRAMIDE HCL 10 MG PO TABS
10.0000 mg | ORAL_TABLET | Freq: Four times a day (QID) | ORAL | Status: DC
  Administered 2015-09-09 – 2015-09-15 (×22): 10 mg via ORAL
  Filled 2015-09-09 (×22): qty 1

## 2015-09-09 MED ORDER — DOCUSATE SODIUM 100 MG PO CAPS
200.0000 mg | ORAL_CAPSULE | Freq: Every day | ORAL | Status: DC
Start: 2015-09-10 — End: 2015-09-15
  Administered 2015-09-10 – 2015-09-12 (×3): 200 mg via ORAL
  Filled 2015-09-09 (×5): qty 2

## 2015-09-09 MED ORDER — POTASSIUM CHLORIDE CRYS ER 20 MEQ PO TBCR
20.0000 meq | EXTENDED_RELEASE_TABLET | Freq: Two times a day (BID) | ORAL | Status: DC
  Administered 2015-09-09: 20 meq via ORAL
  Filled 2015-09-09: qty 1

## 2015-09-09 MED ORDER — ACETAMINOPHEN 325 MG PO TABS
650.0000 mg | ORAL_TABLET | Freq: Four times a day (QID) | ORAL | Status: DC | PRN
  Administered 2015-09-12: 650 mg via ORAL
  Filled 2015-09-09: qty 2

## 2015-09-09 MED ORDER — ONDANSETRON HCL 4 MG PO TABS: 4.0000 mg | ORAL_TABLET | Freq: Four times a day (QID) | ORAL | Status: DC | PRN

## 2015-09-09 MED ORDER — CHLORHEXIDINE GLUCONATE 0.12 % MT SOLN: 15.0000 mL | Freq: Two times a day (BID) | OROMUCOSAL | Status: DC

## 2015-09-09 MED ORDER — TRAMADOL HCL 50 MG PO TABS: 50.0000 mg | ORAL_TABLET | Freq: Four times a day (QID) | ORAL | Status: DC | PRN

## 2015-09-09 MED ORDER — LISINOPRIL 2.5 MG PO TABS: 2.5000 mg | ORAL_TABLET | Freq: Every day | ORAL | Status: DC

## 2015-09-09 MED ORDER — BUDESONIDE 0.5 MG/2ML IN SUSP
0.5000 mg | Freq: Two times a day (BID) | RESPIRATORY_TRACT | Status: DC
  Administered 2015-09-09 – 2015-09-15 (×11): 0.5 mg via RESPIRATORY_TRACT
  Filled 2015-09-09 (×13): qty 2

## 2015-09-09 MED ORDER — ENOXAPARIN SODIUM 40 MG/0.4ML ~~LOC~~ SOLN
40.0000 mg | SUBCUTANEOUS | Status: DC
  Administered 2015-09-10 – 2015-09-15 (×6): 40 mg via SUBCUTANEOUS
  Filled 2015-09-09 (×6): qty 0.4

## 2015-09-09 MED ORDER — FUROSEMIDE 40 MG PO TABS: 40.0000 mg | ORAL_TABLET | Freq: Two times a day (BID) | ORAL | Status: DC

## 2015-09-09 MED ORDER — FUROSEMIDE 40 MG PO TABS
40.0000 mg | ORAL_TABLET | Freq: Two times a day (BID) | ORAL | Status: DC
  Administered 2015-09-09: 40 mg via ORAL
  Filled 2015-09-09: qty 1

## 2015-09-09 MED ORDER — ACETAMINOPHEN 325 MG PO TABS: 650.0000 mg | ORAL_TABLET | Freq: Four times a day (QID) | ORAL | Status: DC | PRN

## 2015-09-09 MED ORDER — LISINOPRIL 2.5 MG PO TABS
2.5000 mg | ORAL_TABLET | Freq: Every day | ORAL | Status: DC
  Administered 2015-09-10 – 2015-09-15 (×6): 2.5 mg via ORAL
  Filled 2015-09-09 (×6): qty 1

## 2015-09-09 MED ORDER — TRAMADOL HCL 50 MG PO TABS
50.0000 mg | ORAL_TABLET | Freq: Four times a day (QID) | ORAL | Status: DC | PRN
  Administered 2015-09-10 – 2015-09-13 (×8): 50 mg via ORAL
  Filled 2015-09-09 (×9): qty 1

## 2015-09-09 MED ORDER — BISACODYL 5 MG PO TBEC
10.0000 mg | DELAYED_RELEASE_TABLET | Freq: Every day | ORAL | Status: DC
  Administered 2015-09-10 – 2015-09-11 (×2): 10 mg via ORAL
  Filled 2015-09-09 (×2): qty 2

## 2015-09-09 MED ORDER — AMIODARONE HCL 200 MG PO TABS
200.0000 mg | ORAL_TABLET | Freq: Two times a day (BID) | ORAL | Status: DC
  Administered 2015-09-09 – 2015-09-15 (×12): 200 mg via ORAL
  Filled 2015-09-09 (×12): qty 1

## 2015-09-09 MED ORDER — LISINOPRIL 2.5 MG PO TABS
2.5000 mg | ORAL_TABLET | Freq: Every day | ORAL | Status: DC
  Administered 2015-09-09: 2.5 mg via ORAL
  Filled 2015-09-09: qty 1

## 2015-09-09 MED ORDER — BUDESONIDE 0.5 MG/2ML IN SUSP: 0.5000 mg | Freq: Two times a day (BID) | RESPIRATORY_TRACT | Status: DC

## 2015-09-09 NOTE — Progress Notes (Signed)
Retta Diones, RN Rehab Admission Coordinator Signed Physical Medicine and Rehabilitation PMR Pre-admission 09/09/2015 12:00 PM  Related encounter: Admission (Current) from 08/19/2015 in [facility]   PMR Admission Coordinator Pre-Admission Assessment  Patient: Victor Diaz is an 70 y.o., male MRN: [id number] DOB: 14-Aug-1945 Height: 5\' 6"  (167.6 cm) Weight: 99.7 kg (219 lb 12.8 oz)  Insurance Information HMO: No PPO: PCP: IPA: 80/20: OTHER:  PRIMARY: Medicare A/B Policy#: [id number] Subscriber: Victor Diaz CM Name: Phone#: Fax#:  Pre-Cert#: Employer: Retired Benefits: Phone #: Name: Checked in [place]. Date: 11/15/08 Deduct: $1316 Out of Pocket Max: none Life Max: unlimited CIR: 100% SNF: 100 days Outpatient: 80% Co-Pay: 20% Home Health: 100% Co-Pay: none DME: 80% Co-Pay: 20% Providers: patient's choice  SECONDARY: BCBS supplement Policy#: [id number] Subscriber: Victor Diaz CM Name: Phone#: Fax#:  Pre-Cert#: Employer: Retired Benefits: Phone #: [phone] Name:  Eff. Date: 04/17/10 Deduct: Out of Pocket Max: Life Max:  CIR: SNF:  Outpatient: Co-Pay:  Home Health: Co-Pay:  DME: Co-Pay:   Emergency Contact Information Contact Information    Name Relation Home Work Mobile   Klimaszewski,Chris Son [phone]  [phone]     Current Medical History  Patient Admitting Diagnosis: Debility after CABG and Hemicolectomy  History of Present Illness: A 70 y.o. right handed male with history of obesity, hypertension, hyperlipidemia, PAF and poor anticoagulation  candidate due to alcohol abuse and history of syncope, subclavian artery stenosis status post stenting. Patient lives with son and grandchildren. Independent prior to admission. Limited assistance of family. One level home with basement. Presented 08/19/2015 with episodes of dizziness and presyncope, chest discomfort and increasing dyspnea with exertion. Recent cardiac stress test positive for ischemia and subsequent cardiac catheterization demonstrated severe multivessel coronary artery disease. Echocardiogram with ejection fraction of 45% diffuse hypokinesis. Underwent CABG 4 08/20/2015 per [name] trigt. Hospital course acute blood loss anemia 8.0-8.3 and monitored. Currently maintained on aspirin 81 mg daily after CABG. Subcutaneous Lovenox for DVT prophylaxis. Developed atrial fibrillation 08/22/2015 maintained on intravenous amiodarone and transitioned to Lopressor secondary to nausea from amiodarone. 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 A nasogastric tube initially placed. Underwent exploratory laparotomy right hemicolectomy with anastomosis 08/25/2015 per [name]. Nasogastric tube is been removed and diet advanced to regular consistency. Physical therapy evaluation completed 08/22/2015 with strict sternal precautions and recommendations of physical medicine rehabilitation consult. Patient to be admitted for a comprehensive inpatient rehabilitation program.   Note: Patient self reports that he has an alcohol problem and that he needs to quit drinking.   Past Medical History  Past Medical History  Diagnosis Date  . Allergy   . Gout   . Hyperlipidemia   . Alcohol abuse   . Hemothorax on right   . Lisfranc's dislocation 10/20/2011  . Right fibular fracture 10/20/2011  . Essential hypertension   . PSVT (paroxysmal supraventricular tachycardia) ([place])   . Paroxysmal atrial flutter (HCC)     Poor anticoagulation  candidate with active alcohol abuse and syncope  . Subclavian artery stenosis, left     Status post stent placement by Dr. Trula Slade 10/2014    Family History  family history includes Arthritis in his mother; Cerebral aneurysm in his mother; Tuberculosis in his father.  Prior Rehab/Hospitalizations: Had [facility] in 2009 after hip surgery  Has the patient had major surgery during 100 days prior to admission? No. Patient does report that he  had a stent placed last 08/16  Current Medications   Current facility-administered medications:  . acetaminophen (TYLENOL) tablet 650 mg, 650 mg, Oral, Q6H PRN, Rexene Alberts, MD, 650 mg at 09/06/15 2329 . amiodarone (PACERONE) tablet 200 mg, 200 mg, Oral, BID, Ivin Poot, MD, 200 mg at 09/09/15 1023 . antiseptic oral rinse (CPC / CETYLPYRIDINIUM CHLORIDE 0.05%) solution 7 mL, 7 mL, Mouth Rinse, q12n4p, Ivin Poot, MD, 7 mL at 09/07/15 1600 . aspirin EC tablet 325 mg, 325 mg, Oral, Daily, Donielle M Zimmerman, PA-C, 325 mg at 09/09/15 1023 . bisacodyl (DULCOLAX) EC tablet 10 mg, 10 mg, Oral, Daily, 10 mg at 09/06/15 1037 **OR** bisacodyl (DULCOLAX) suppository 10 mg, 10 mg, Rectal, Daily, Wayne E Gold, PA-C, 10 mg at 09/03/15 1219 . budesonide (PULMICORT) nebulizer solution 0.5 mg, 0.5 mg, Nebulization, BID, Ivin Poot, MD, 0.5 mg at 09/09/15 0947 . chlorhexidine (PERIDEX) 0.12 % solution 15 mL, 15 mL, Mouth Rinse, BID, Ivin Poot, MD, 15 mL at 09/09/15 1023 . docusate sodium (COLACE) capsule 200 mg, 200 mg, Oral, Daily, Jake Church Masters, RPH, 200 mg at 09/08/15 1000 . enoxaparin (LOVENOX) injection 40 mg, 40 mg, Subcutaneous, Q24H, Ivin Poot, MD, 40 mg at 09/09/15 0839 . furosemide (LASIX) tablet 40 mg, 40 mg, Oral, BID, Donielle Liston Alba, PA-C, 40 mg at 09/09/15 0839 . ipratropium-albuterol (DUONEB) 0.5-2.5 (3) MG/3ML nebulizer solution 3 mL, 3 mL, Nebulization, Q6H PRN, Ivin Poot, MD, 3 mL at 09/06/15  2011 . lisinopril (PRINIVIL,ZESTRIL) tablet 2.5 mg, 2.5 mg, Oral, Daily, Donielle M Zimmerman, PA-C, 2.5 mg at 09/09/15 1023 . LORazepam (ATIVAN) tablet 0.5 mg, 0.5 mg, Oral, QHS, Ivin Poot, MD, 0.5 mg at 09/08/15 2121 . metoCLOPramide (REGLAN) injection 10 mg, 10 mg, Intravenous, Q6H, Ivin Poot, MD, 10 mg at 09/09/15 0608 . metoprolol (LOPRESSOR) injection 2.5-5 mg, 2.5-5 mg, Intravenous, Q2H PRN, Wayne E Gold, PA-C, 5 mg at 09/01/15 0602 . metoprolol succinate (TOPROL-XL) 24 hr tablet 25 mg, 25 mg, Oral, Daily, Donielle M Zimmerman, PA-C, 25 mg at 09/09/15 1023 . ondansetron (ZOFRAN) injection 4 mg, 4 mg, Intravenous, Q6H PRN, Wayne E Gold, PA-C, 4 mg at 08/29/15 1420 . pantoprazole (PROTONIX) EC tablet 40 mg, 40 mg, Oral, Daily, Jake Church Masters, RPH, 40 mg at 09/08/15 1700 . potassium chloride SA (K-DUR,KLOR-CON) CR tablet 20 mEq, 20 mEq, Oral, BID, Donielle M Zimmerman, PA-C, 20 mEq at 09/09/15 1023 . sodium chloride flush (NS) 0.9 % injection 10-40 mL, 10-40 mL, Intracatheter, Q12H, Ivin Poot, MD, 10 mL at 09/03/15 1000 . sodium chloride flush (NS) 0.9 % injection 10-40 mL, 10-40 mL, Intracatheter, PRN, Ivin Poot, MD, 10 mL at 09/09/15 0820 . traMADol (ULTRAM) tablet 50 mg, 50 mg, Oral, Q6H PRN, Ivin Poot, MD, 50 mg at 09/08/15 2320  Patients Current Diet: Diet heart healthy/carb modified Room service appropriate?: Yes; Fluid consistency:: Thin  Precautions / Restrictions Precautions Precautions: Fall, Sternal Precaution Comments: Chest tubes, Gordy Councilman Restrictions Weight Bearing Restrictions: Yes (sternal precautions) Other Position/Activity Restrictions: Pt is not using pillow to move until PT encouraged him   Has the patient had 2 or more falls or a fall with injury in the past year?Yes. Reports at least 5 falls with injury. He had to go to the hospital and have stitches in his head.  Prior Activity Level Household: Was mostly homebound  prior to admission going out for MD appointments only.  Home Assistive Devices / [place]  Assistive Devices/Equipment: Eyeglasses Home Equipment: Walker - 2 wheels, Bedside commode, Cane - single point  Prior Device Use: Indicate devices/aids used by the patient prior to current illness, exacerbation or injury? None  Prior Functional Level Prior Function Level of Independence: Independent  Self Care: Did the patient need help bathing, dressing, using the toilet or eating? Independent  Indoor Mobility: Did the patient need assistance with walking from room to room (with or without device)? Independent  Stairs: Did the patient need assistance with internal or external stairs (with or without device)? Independent  Functional Cognition: Did the patient need help planning regular tasks such as shopping or remembering to take medications? Independent  Current Functional Level Cognition  Overall Cognitive Status: Within Functional Limits for tasks assessed Orientation Level: Oriented X4   Extremity Assessment (includes Sensation/Coordination)  Upper Extremity Assessment: Defer to OT evaluation  Lower Extremity Assessment: Generalized weakness    ADLs  Anticipate ADL deficits and need for OT interventions    Mobility  Overal bed mobility: Needs Assistance Bed Mobility: Rolling, Sidelying to Sit Rolling: Min assist Sidelying to sit: Mod assist, +2 for physical assistance, HOB elevated Sit to sidelying: Mod assist General bed mobility comments: up when PT arrived    Transfers  Overall transfer level: Needs assistance Equipment used: 4-wheeled walker Transfers: Sit to/from Stand, Stand Pivot Transfers Sit to Stand: Min guard Stand pivot transfers: Min guard General transfer comment: Held pillow and was able to power up with LEs only. Did rock for momentum    Ambulation / Gait / Stairs / Emergency planning/management officer  Ambulation/Gait Ambulation/Gait assistance:  Min guard, Min assist (had one moment of unsteadiness) Ambulation Distance (Feet): 180 Feet Assistive device: 4-wheeled walker Gait Pattern/deviations: Step-through pattern, Wide base of support, Drifts right/left General Gait Details: Pt needed postural prompts, safety instructions and reminders about brakes and transitions. SOB and needed reminders for pace, standing rests Gait velocity: reduced Gait velocity interpretation: Below normal speed for age/gender    Posture / Balance Balance Overall balance assessment: Needs assistance Sitting-balance support: Feet supported Sitting balance-Leahy Scale: Good Postural control: Posterior lean Standing balance support: Bilateral upper extremity supported Standing balance-Leahy Scale: Fair Standing balance comment: fair- dynamic balance High level balance activites: Direction changes, Turns, Sudden stops High Level Balance Comments: Needs min guard assist with above.     Special needs/care consideration BiPAP/CPAP No CPM No Continuous Drip IV No Dialysis No  Life Vest No Oxygen No Special Bed No Trach Size No Wound Vac (area) No  Skin Has sternal incision and mid abdominal incision and leg incisions  Bowel mgmt: Last BM 09/09/15 Bladder mgmt: Voiding in urinal WDL Diabetic mgmt No    Previous Home Environment Living Arrangements: Children Available Help at Discharge: Family, Available 24 hours/day Type of Home: House Home Layout: Laundry or work area in basement, Able to live on main level with bedroom/bathroom Home Access: Stairs to enter Entrance Stairs-Rails: None Technical brewer of Steps: 2 Home Care Services: No  Discharge Living Setting Plans for Discharge Living Setting: Patient's home, House, Lives with (comment) (Son, granddaughter and her 2 kids live with patient.) Type of Home at Discharge: House Discharge Home Layout: Two level, Laundry or work area in basement,  Able to live on main level with bedroom/bathroom Alternate Level Stairs-Number of Steps: Flight Discharge Home Access: Stairs to enter [employer] of Steps: 1-2 steps at back entry Does the patient have any problems obtaining your medications?: No Note: Patient says that he could go home  with youngest son and his girlfriend to his apartment but he does not know how many steps would be at apartment.  Social/Family/Support Systems Patient Roles: Parent, Other (Comment) (Has 2 sons, granddaughter and 2 greatgrandkids.) Contact Information: Amoni Tureaud - son [phone] Anticipated Caregiver: self, sons, granddaughter, sons girlfriend Ability/Limitations of Caregiver: One son may go to the beach for 2 months. Youngest son and his girlfriend may assist. Granddaughter works. Caregiver Availability: Intermittent Discharge Plan Discussed with Primary Caregiver: Yes Is Caregiver In Agreement with Plan?: Yes Does Caregiver/Family have Issues with Lodging/Transportation while Pt is in Rehab?: No  Goals/Additional Needs Patient/Family Goal for Rehab: PT/OT mod I goals Expected length of stay: 7-10 days Cultural Considerations: None Dietary Needs: Heart healthy, carb modified, thin liquids Equipment Needs: TBD Pt/Family Agrees to Admission and willing to participate: Yes Program Orientation Provided & Reviewed with Pt/Caregiver Including Roles & Responsibilities: Yes  Decrease burden of Care through IP rehab admission: N/A  Possible need for SNF placement upon discharge: Not planned  Patient Condition: This patient's medical and functional status has changed since the consult dated: 08/24/15 in which the Rehabilitation Physician determined and documented that the patient's condition is appropriate for intensive rehabilitative care in an inpatient rehabilitation facility. See "History of Present Illness" (above) for medical update. Functional changes are: Currently requiring min to  minguard assist to ambulate 180 feet. Patient's medical and functional status update has been discussed with the Rehabilitation physician and patient remains appropriate for inpatient rehabilitation. Will admit to inpatient rehab today.  Preadmission Screen Completed By: Retta Diones, 09/09/2015 12:17 PM ______________________________________________________________________  Discussed status with Dr. Letta Pate on 09/09/15 at 1214 and received telephone approval for admission today.  Admission Coordinator: Retta Diones, time1214/Date05/25/17          Cosigned by: Charlett Blake, MD at 09/09/2015 12:26 PM  Revision History     Date/Time User Provider Type Action   09/09/2015 12:26 PM Charlett Blake, MD Physician Cosign   09/09/2015 12:17 PM Retta Diones, RN Rehab Admission Coordinator Sign

## 2015-09-09 NOTE — Progress Notes (Signed)
Patient ID: Victor Diaz, male   DOB: 04-02-1946, 70 y.o.   MRN: [id number]     [facility]      [address] [place], [place] [id number]    Phone: [phone] FAX: [phone]     Subjective: Jodell Cipro not removed as ordered. Tolerating POs, pain controlled. Having BMs.  No n/v.  Objective:  Vital signs:  Filed Vitals:   09/08/15 2015 09/08/15 2120 09/09/15 0405 09/09/15 0412  BP: 138/62 123/74 149/73   Pulse: 85 89 88   Temp: 98.2 F (36.8 C)  98 F (36.7 C)   TempSrc: Oral  Oral   Resp: 18  17   Height:      Weight:    99.7 kg (219 lb 12.8 oz)  SpO2: 97%  95%     Last BM Date: 09/09/15  Intake/Output   Yesterday:  05/24 0701 - 05/25 0700 In: [id number] [P.O.:822] Out: [id number] [Urine:4625] This shift:  Total I/O In: 240 [P.O.:240] Out: 650 [Urine:650]   Physical Exam: General: Pt awake/alert/oriented x4 in no acute distress  Abdomen: Soft. Nondistended. Midline wound with staples widely placed, no erythema. No evidence of peritonitis. No incarcerated hernias.   Problem List:   Active Problems:   Abnormal myocardial perfusion study   Unstable angina (HCC)   S/P CABG x 4   Acute blood loss anemia   Acute systolic congestive heart failure (HCC)   Coronary artery disease involving native coronary artery of native heart without angina pectoris   Essential hypertension   Obesity   Paroxysmal atrial fibrillation (HCC)   Subclavian artery stenosis (HCC)   Tachycardia   Tachypnea   Pressure ulcer    Results:   Labs: Results for orders placed or performed during the hospital encounter of 08/19/15 (from the past 48 hour(s))  Glucose, capillary     Status: Abnormal   Collection Time: 09/07/15  1:03 PM  Result Value Ref Range   Glucose-Capillary 177 (H) 65 - 99 mg/dL   Comment 1 Notify RN    Comment 2 Document in Chart   Glucose, capillary     Status: Abnormal   Collection Time: 09/07/15  5:56 PM  Result  Value Ref Range   Glucose-Capillary 114 (H) 65 - 99 mg/dL  Glucose, capillary     Status: None   Collection Time: 09/08/15  1:41 AM  Result Value Ref Range   Glucose-Capillary 87 65 - 99 mg/dL  Glucose, capillary     Status: Abnormal   Collection Time: 09/08/15  6:14 AM  Result Value Ref Range   Glucose-Capillary 103 (H) 65 - 99 mg/dL    Imaging / Studies: No results found.  Medications / Allergies:  Scheduled Meds: . amiodarone  200 mg Oral BID  . antiseptic oral rinse  7 mL Mouth Rinse q12n4p  . aspirin EC  325 mg Oral Daily  . bisacodyl  10 mg Oral Daily   Or  . bisacodyl  10 mg Rectal Daily  . budesonide (PULMICORT) nebulizer solution  0.5 mg Nebulization BID  . chlorhexidine  15 mL Mouth Rinse BID  . docusate sodium  200 mg Oral Daily  . enoxaparin (LOVENOX) injection  40 mg Subcutaneous Q24H  . furosemide  40 mg Oral BID  . lisinopril  2.5 mg Oral Daily  . LORazepam  0.5 mg Oral QHS  . metoCLOPramide (REGLAN) injection  10 mg Intravenous Q6H  . metoprolol succinate  25 mg Oral Daily  .  pantoprazole  40 mg Oral Daily  . potassium chloride  20 mEq Oral BID  . sodium chloride flush  10-40 mL Intracatheter Q12H   Continuous Infusions:  PRN Meds:.acetaminophen, ipratropium-albuterol, metoprolol, ondansetron (ZOFRAN) IV, sodium chloride flush, traMADol  Antibiotics: Anti-infectives    Start     Dose/Rate Route Frequency Ordered Stop   08/26/15 1000  cefoTEtan (CEFOTAN) 1 g in dextrose 5 % 50 mL IVPB     1 g 100 mL/hr over 30 Minutes Intravenous  Once 08/26/15 0853 08/26/15 1057   08/25/15 1115  cefoTEtan in Dextrose 5% (CEFOTAN) IVPB 2 g     2 g Intravenous  Once 08/25/15 1111 08/25/15 1150   08/25/15 1113  cefoTEtan in Dextrose 5% (CEFOTAN) 2-2.08 GM-% IVPB    Comments:  [name]   : cabinet override      08/25/15 1113 08/25/15 2329   08/21/15 0400  cefUROXime (ZINACEF) 1.5 g in dextrose 5 % 50 mL IVPB     1.5 g 100 mL/hr over 30 Minutes Intravenous Every  12 hours 08/20/15 1742 08/22/15 1630   08/20/15 2330  vancomycin (VANCOCIN) IVPB 1000 mg/200 mL premix     1,000 mg 200 mL/hr over 60 Minutes Intravenous Every 12 hours 08/20/15 1827 08/21/15 2330   08/20/15 2315  vancomycin (VANCOCIN) IVPB 1000 mg/200 mL premix  Status:  Discontinued     1,000 mg 200 mL/hr over 60 Minutes Intravenous  Once 08/20/15 1742 08/20/15 1827   08/20/15 0400  vancomycin (VANCOCIN) 1,500 mg in sodium chloride 0.9 % 250 mL IVPB  Status:  Discontinued     1,500 mg 125 mL/hr over 120 Minutes Intravenous To Surgery 08/19/15 1605 08/20/15 1523   08/20/15 0400  cefUROXime (ZINACEF) 1.5 g in dextrose 5 % 50 mL IVPB  Status:  Discontinued     1.5 g 100 mL/hr over 30 Minutes Intravenous To Surgery 08/19/15 1605 08/20/15 1523   08/20/15 0400  cefUROXime (ZINACEF) 750 mg in dextrose 5 % 50 mL IVPB  Status:  Discontinued     750 mg 100 mL/hr over 30 Minutes Intravenous To Surgery 08/19/15 1605 08/20/15 1523        Assessment/Plan 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 08/25/15 -DC staples, again, continue mobilization, heart healthy diet, encourage PO pain meds PRN. -stable for DC from a surgical standpoint, CIR v home.  Please call for further assistance.   Erby Pian, [facility] Surgery Pager [phone]) For consults and floor pages call [phone](7A-4:30P)  09/09/2015 9:12 AM

## 2015-09-09 NOTE — Care Management Note (Signed)
Case Management Note Marvetta Gibbons RN, BSN Unit 2W-Case Manager [phone]  Patient Details  Name: Victor Diaz MRN: [id number] Date of Birth: 16-Mar-1946  Subjective/Objective:    Pt admitted s/p CABG with post op ileus then s/p exp lap with colectomy- prolonged ileus-                Action/Plan: PTA pt lived at home with family- anticipate SNF vs home with [facility] pt progressing well with mobility and PT. CSW following for possible placement in STSNF, CIR consulted however pt may progress past needing CIR. CM to follow for possible home with [facility].   Expected Discharge Date:                  Expected Discharge Plan:  Skilled Nursing Facility  In-House Referral:  Clinical Social Work  Discharge planning Services  CM Consult  Post Acute Care Choice:    Choice offered to:     DME Arranged:    DME Agency:     HH Arranged:    [place] Agency:     Status of Service:  Completed, signed off  Medicare Important Message Given:  Yes Date Medicare IM Given:    Medicare IM give by:    Date Additional Medicare IM Given:    Additional Medicare Important Message give by:     If discussed at [place] of Stay Meetings, dates discussed:    Additional Comments:  09/09/15- Marvetta Gibbons RN, BSN- spoke with Genie from [facility] -they do have a bed for pt today and can offer to take pt today if medically stable- MD ok with tx to CIR- plan to d/c later  Today to CIR  Dawayne Patricia, RN 09/09/2015, 9:07 PM

## 2015-09-09 NOTE — Progress Notes (Signed)
Ed completed with pt. Voiced understanding and requests his referral be sent to [facility]. Set up d/c video. Pt plans to not drink any more or not as much. [place], ACSM 11:23 AM 09/09/2015

## 2015-09-09 NOTE — Clinical Social Work Note (Signed)
Patient will admit to inpatient rehab. CSW signing off.   [employer], LCSW [phone]

## 2015-09-09 NOTE — Progress Notes (Signed)
Rehab admissions - I met with patient this am.  He would like to come to inpatient rehab for about a week before discharge home.  Bed available and will admit to acute inpatient rehab today.  He has been cleared by surgery and cardiovascular teams.  Call me for questions.  #[phone]

## 2015-09-09 NOTE — Progress Notes (Signed)
Removed 9 surgical staples from abdominal incision per order. Swabbed  incision with betadine.  Applied 1/2 steri strips with benzoin to incision area. Patient tolerated procedure well.  Patient aware to call RN if drainage. Payton Emerald, RN

## 2015-09-09 NOTE — Progress Notes (Signed)
Received pt. As a transfer from [address].Pt. Has been oriented to the unit routine,safety plan was explained,fall prevention plan was explained and signed by pt. And RN.Welcome,e video was presented to the pt. On the initial assessment,two unstagable spots were found on the rt. And lt. Buttocks,2 small,all foams were applied.Keep monitoring pt. Closely and assessing his needs.

## 2015-09-09 NOTE — PMR Pre-admission (Signed)
PMR Admission Coordinator Pre-Admission Assessment  Patient: Victor Diaz is an 70 y.o., male MRN: [id number] DOB: [birth year] Height: 5\' 6"  (167.6 cm) Weight: 99.7 kg (219 lb 12.8 oz)              Insurance Information HMO: No   PPO:       PCP:       IPA:       80/20:       OTHER:   PRIMARY: Medicare A/B      Policy#: [id number]      Subscriber: Kandice Hams CM Name:        Phone#:       Fax#:   Pre-Cert#:        Employer:  Retired Benefits:  Phone #:       Name: Checked in [place]. Date: 11/15/08     Deduct: $1316      Out of Pocket Max: none      Life Max: unlimited CIR: 100%      SNF: 100 days Outpatient: 80%     Co-Pay: 20% Home Health: 100%      Co-Pay: none DME: 80%     Co-Pay: 20% Providers: patient's choice  SECONDARY: BCBS supplement      Policy#: [id number]      Subscriber: Kandice Hams CM Name:        Phone#:       Fax#:   Pre-Cert#:        Employer: Retired Benefits:  Phone #:  [phone]     Name:   Eff. Date: 04/17/10     Deduct:        Out of Pocket Max:        Life Max:   CIR:        SNF:   Outpatient:       Co-Pay:   Home Health:        Co-Pay:   DME:       Co-Pay:    Emergency Contact Information Contact Information    Name Relation Home Work Mobile   Landa,Chris Son [phone]  [phone]     Current Medical History  Patient Admitting Diagnosis:  Debility after CABG and Hemicolectomy  History of Present Illness: A 70 y.o. right handed male with history of obesity, hypertension, hyperlipidemia, PAF and poor anticoagulation candidate due to alcohol abuse and history of syncope, subclavian artery stenosis status post stenting. Patient lives with son and grandchildren. Independent prior to admission. Limited assistance of family. One level home with basement. Presented 08/19/2015 with episodes of dizziness and presyncope, chest discomfort and increasing dyspnea with exertion. Recent cardiac stress test positive for ischemia and subsequent  cardiac catheterization demonstrated severe multivessel coronary artery disease. Echocardiogram with ejection fraction of 45% diffuse hypokinesis. Underwent CABG 4 08/20/2015 per [name] trigt. Hospital course acute blood loss anemia 8.0-8.3 and monitored. Currently maintained on aspirin 81 mg daily after CABG. Subcutaneous Lovenox for DVT prophylaxis. Developed atrial fibrillation 08/22/2015 maintained on intravenous amiodarone and transitioned to Lopressor secondary to nausea from amiodarone. 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 A nasogastric tube initially placed. Underwent exploratory laparotomy right hemicolectomy with anastomosis 08/25/2015 per [name]. Nasogastric tube is been removed and diet advanced to regular consistency. Physical therapy evaluation completed 08/22/2015 with strict sternal precautions and recommendations of physical medicine rehabilitation consult. Patient to be admitted for a comprehensive inpatient rehabilitation program.   Note:  Patient self reports that he has  an alcohol problem and that he needs to quit drinking.    Past Medical History  Past Medical History  Diagnosis Date  . Allergy   . Gout   . Hyperlipidemia   . Alcohol abuse   . Hemothorax on right   . Lisfranc's dislocation 10/20/2011  . Right fibular fracture 10/20/2011  . Essential hypertension   . PSVT (paroxysmal supraventricular tachycardia) ([facility])   . Paroxysmal atrial flutter ([facility])     Poor anticoagulation candidate with active alcohol abuse and syncope  . Subclavian artery stenosis, left     Status post stent placement by Dr. Trula Slade 10/2014    Family History  family history includes Arthritis in his mother; Cerebral aneurysm in his mother; Tuberculosis in his father.  Prior Rehab/Hospitalizations: Had [facility] in 2009 after hip surgery  Has the patient had major surgery during 100 days prior to admission? No.  Patient does report that he had a stent  placed last 08/16  Current Medications   Current facility-administered medications:  .  acetaminophen (TYLENOL) tablet 650 mg, 650 mg, Oral, Q6H PRN, Rexene Alberts, MD, 650 mg at 09/06/15 2329 .  amiodarone (PACERONE) tablet 200 mg, 200 mg, Oral, BID, Ivin Poot, MD, 200 mg at 09/09/15 1023 .  antiseptic oral rinse (CPC / CETYLPYRIDINIUM CHLORIDE 0.05%) solution 7 mL, 7 mL, Mouth Rinse, q12n4p, Ivin Poot, MD, 7 mL at 09/07/15 1600 .  aspirin EC tablet 325 mg, 325 mg, Oral, Daily, Donielle M Zimmerman, PA-C, 325 mg at 09/09/15 1023 .  bisacodyl (DULCOLAX) EC tablet 10 mg, 10 mg, Oral, Daily, 10 mg at 09/06/15 1037 **OR** bisacodyl (DULCOLAX) suppository 10 mg, 10 mg, Rectal, Daily, Wayne E Gold, PA-C, 10 mg at 09/03/15 1219 .  budesonide (PULMICORT) nebulizer solution 0.5 mg, 0.5 mg, Nebulization, BID, Ivin Poot, MD, 0.5 mg at 09/09/15 0947 .  chlorhexidine (PERIDEX) 0.12 % solution 15 mL, 15 mL, Mouth Rinse, BID, Ivin Poot, MD, 15 mL at 09/09/15 1023 .  docusate sodium (COLACE) capsule 200 mg, 200 mg, Oral, Daily, Jake Church Masters, RPH, 200 mg at 09/08/15 1000 .  enoxaparin (LOVENOX) injection 40 mg, 40 mg, Subcutaneous, Q24H, Ivin Poot, MD, 40 mg at 09/09/15 0839 .  furosemide (LASIX) tablet 40 mg, 40 mg, Oral, BID, Donielle Liston Alba, PA-C, 40 mg at 09/09/15 0839 .  ipratropium-albuterol (DUONEB) 0.5-2.5 (3) MG/3ML nebulizer solution 3 mL, 3 mL, Nebulization, Q6H PRN, Ivin Poot, MD, 3 mL at 09/06/15 2011 .  lisinopril (PRINIVIL,ZESTRIL) tablet 2.5 mg, 2.5 mg, Oral, Daily, Donielle M Zimmerman, PA-C, 2.5 mg at 09/09/15 1023 .  LORazepam (ATIVAN) tablet 0.5 mg, 0.5 mg, Oral, QHS, Ivin Poot, MD, 0.5 mg at 09/08/15 2121 .  metoCLOPramide (REGLAN) injection 10 mg, 10 mg, Intravenous, Q6H, Ivin Poot, MD, 10 mg at 09/09/15 0608 .  metoprolol (LOPRESSOR) injection 2.5-5 mg, 2.5-5 mg, Intravenous, Q2H PRN, Wayne E Gold, PA-C, 5 mg at 09/01/15 0602 .   metoprolol succinate (TOPROL-XL) 24 hr tablet 25 mg, 25 mg, Oral, Daily, Donielle M Zimmerman, PA-C, 25 mg at 09/09/15 1023 .  ondansetron (ZOFRAN) injection 4 mg, 4 mg, Intravenous, Q6H PRN, Wayne E Gold, PA-C, 4 mg at 08/29/15 1420 .  pantoprazole (PROTONIX) EC tablet 40 mg, 40 mg, Oral, Daily, Jake Church Masters, RPH, 40 mg at 09/08/15 1700 .  potassium chloride SA (K-DUR,KLOR-CON) CR tablet 20 mEq, 20 mEq, Oral, BID, Donielle M Zimmerman, PA-C, 20 mEq at 09/09/15 1023 .  sodium chloride flush (NS) 0.9 % injection 10-40 mL, 10-40 mL, Intracatheter, Q12H, Ivin Poot, MD, 10 mL at 09/03/15 1000 .  sodium chloride flush (NS) 0.9 % injection 10-40 mL, 10-40 mL, Intracatheter, PRN, Ivin Poot, MD, 10 mL at 09/09/15 0820 .  traMADol (ULTRAM) tablet 50 mg, 50 mg, Oral, Q6H PRN, Ivin Poot, MD, 50 mg at 09/08/15 2320  Patients Current Diet: Diet heart healthy/carb modified Room service appropriate?: Yes; Fluid consistency:: Thin  Precautions / Restrictions Precautions Precautions: Fall, Sternal Precaution Comments: Chest tubes, Gordy Councilman Restrictions Weight Bearing Restrictions: Yes (sternal precautions) Other Position/Activity Restrictions: Pt is not using pillow to move until PT encouraged him   Has the patient had 2 or more falls or a fall with injury in the past year?Yes.  Reports at least 5 falls with injury.  He had to go to the hospital and have stitches in his head.  Prior Activity Level Household: Was mostly homebound prior to admission going out for MD appointments only.  Home Assistive Devices / Equipment Home Assistive Devices/Equipment: Eyeglasses Home Equipment: Environmental consultant - 2 wheels, Bedside commode, Cane - single point  Prior Device Use: Indicate devices/aids used by the patient prior to current illness, exacerbation or injury? None  Prior Functional Level Prior Function Level of Independence: Independent  Self Care: Did the patient need help bathing, dressing,  using the toilet or eating?  Independent  Indoor Mobility: Did the patient need assistance with walking from room to room (with or without device)? Independent  Stairs: Did the patient need assistance with internal or external stairs (with or without device)? Independent  Functional Cognition: Did the patient need help planning regular tasks such as shopping or remembering to take medications? Independent  Current Functional Level Cognition  Overall Cognitive Status: Within Functional Limits for tasks assessed Orientation Level: Oriented X4    Extremity Assessment (includes Sensation/Coordination)  Upper Extremity Assessment: Defer to OT evaluation  Lower Extremity Assessment: Generalized weakness    ADLs  Anticipate ADL deficits and need for OT interventions    Mobility  Overal bed mobility: Needs Assistance Bed Mobility: Rolling, Sidelying to Sit Rolling: Min assist Sidelying to sit: Mod assist, +2 for physical assistance, HOB elevated Sit to sidelying: Mod assist General bed mobility comments: up when PT arrived    Transfers  Overall transfer level: Needs assistance Equipment used: 4-wheeled walker Transfers: Sit to/from Stand, Stand Pivot Transfers Sit to Stand: Min guard Stand pivot transfers: Min guard General transfer comment: Held pillow and was able to power up with LEs only.  Did rock for momentum    Ambulation / Gait / Stairs / Emergency planning/management officer  Ambulation/Gait Ambulation/Gait assistance: Min guard, Min assist (had one moment of unsteadiness) Ambulation Distance (Feet): 180 Feet Assistive device: 4-wheeled walker Gait Pattern/deviations: Step-through pattern, Wide base of support, Drifts right/left General Gait Details: Pt needed postural prompts, safety instructions and reminders about brakes and transitions.  SOB and needed reminders for pace, standing rests Gait velocity: reduced Gait velocity interpretation: Below normal speed for age/gender     Posture / Balance Balance Overall balance assessment: Needs assistance Sitting-balance support: Feet supported Sitting balance-Leahy Scale: Good Postural control: Posterior lean Standing balance support: Bilateral upper extremity supported Standing balance-Leahy Scale: Fair Standing balance comment: fair- dynamic balance High level balance activites: Direction changes, Turns, Sudden stops High Level Balance Comments: Needs min guard assist with above.     Special needs/care consideration BiPAP/CPAP No CPM No Continuous Drip IV No Dialysis  No      Life Vest No Oxygen No Special Bed No Trach Size No Wound Vac (area) No     Skin Has sternal incision and mid abdominal incision and leg incisions                            Bowel mgmt: Last BM 09/09/15 Bladder mgmt: Voiding in urinal WDL Diabetic mgmt No    Previous Home Environment Living Arrangements: Children Available Help at Discharge: Family, Available 24 hours/day Type of Home: House Home Layout: Laundry or work area in basement, Able to live on main level with bedroom/bathroom Home Access: Stairs to enter Entrance Stairs-Rails: None Technical brewer of Steps: 2 [place]: No  Discharge Living Setting Plans for Discharge Living Setting: Patient's home, House, Lives with (comment) (Son, granddaughter and her 2 kids live with patient.) Type of Home at Discharge: House Discharge Home Layout: Two level, Laundry or work area in basement, Able to live on main level with bedroom/bathroom Alternate Level Stairs-Number of Steps: Flight Discharge Home Access: Stairs to enter [employer] of Steps: 1-2 steps at back entry Does the patient have any problems obtaining your medications?: No Note:  Patient says that he could go home with youngest son and his girlfriend to his apartment but he does not know how many steps would be at apartment.  Social/Family/Support Systems Patient Roles: Parent, Other  (Comment) (Has 2 sons, granddaughter and 2 greatgrandkids.) Contact Information: Jeovani Aebersold - son [phone] Anticipated Caregiver: self, sons, granddaughter, sons girlfriend Ability/Limitations of Caregiver: One son may go to the beach for 2 months.  Youngest son and his girlfriend may assist.  Granddaughter works. Caregiver Availability: Intermittent Discharge Plan Discussed with Primary Caregiver: Yes Is Caregiver In Agreement with Plan?: Yes Does Caregiver/Family have Issues with Lodging/Transportation while Pt is in Rehab?: No  Goals/Additional Needs Patient/Family Goal for Rehab: PT/OT mod I goals Expected length of stay: 7-10 days Cultural Considerations: None Dietary Needs: Heart healthy, carb modified, thin liquids Equipment Needs: TBD Pt/Family Agrees to Admission and willing to participate: Yes Program Orientation Provided & Reviewed with Pt/Caregiver Including Roles  & Responsibilities: Yes  Decrease burden of Care through IP rehab admission: N/A  Possible need for SNF placement upon discharge: Not planned  Patient Condition: This patient's medical and functional status has changed since the consult dated: 08/24/15 in which the Rehabilitation Physician determined and documented that the patient's condition is appropriate for intensive rehabilitative care in an inpatient rehabilitation facility. See "History of Present Illness" (above) for medical update. Functional changes are: Currently requiring min to minguard assist to ambulate 180 feet. Patient's medical and functional status update has been discussed with the Rehabilitation physician and patient remains appropriate for inpatient rehabilitation. Will admit to inpatient rehab today.  Preadmission Screen Completed By:  Retta Diones, 09/09/2015 12:17 PM ______________________________________________________________________   Discussed status with Dr. Letta Pate on 09/09/15 at 1214 and received telephone approval for  admission today.  Admission Coordinator:  Retta Diones, time1214/Date05/25/17

## 2015-09-09 NOTE — Progress Notes (Signed)
      [address]       [place] [address]             [phone]        15 Days Post-Op Procedure(s) (LRB): EXPLORATORY LAPAROTOMY (N/A) ASCENDING AND TRANSVERSE COLECTOMY (N/A)  Subjective: Patient tolerating diet and had a bowel movement yesterday and this morning.  Objective: Vital signs in last 24 hours: Temp:  [98 F (36.7 C)-98.4 F (36.9 C)] 98 F (36.7 C) (05/25 0405) Pulse Rate:  [85-89] 88 (05/25 0405) Cardiac Rhythm:  [-] Normal sinus rhythm (05/24 1900) Resp:  [17-18] 17 (05/25 0405) BP: (123-152)/(62-76) 149/73 mmHg (05/25 0405) SpO2:  [95 %-100 %] 95 % (05/25 0405) Weight:  [219 lb 12.8 oz (99.7 kg)] 219 lb 12.8 oz (99.7 kg) (05/25 0412)  Pre op weight  90 kg Current Weight  09/09/15 219 lb 12.8 oz (99.7 kg)       Intake/Output from previous day: 05/24 0701 - 05/25 0700 In: 822 [P.O.:822] Out: 4625 [Urine:4625]   Physical Exam:  Cardiovascular: RRR Pulmonary: Clear to auscultation bilaterally; no rales, wheezes, or rhonchi. Abdomen: Soft, non tender, bowel sounds present. Dressing is dry and intact. Extremities: Bilateral lower extremity edema. Ecchymosis right thigh and LE Wounds: Sternal wound is clean and dry.  No erythema or signs of infection. Abdominal wound with slight erythema proximally. Staples intact.  Lab Results: CBC:No results for input(s): WBC, HGB, HCT, PLT in the last 72 hours. BMET:   Recent Labs  09/07/15 0825  NA 131*  K 4.3  CL 98*  CO2 24  GLUCOSE 115*  BUN 19  CREATININE 0.86  CALCIUM 8.2*    PT/INR:  Lab Results  Component Value Date   INR 1.29 08/25/2015   INR 1.67* 08/20/2015   INR 1.09 08/19/2015   ABG:  INR: Will add last result for INR, ABG once components are confirmed Will add last 4 CBG results once components are confirmed  Assessment/Plan:  1. CV - SR in the 90's. On Amiodarone 200 mg bid and Lopressor 12.5 mg bid. Will change Lopressor to Toprol XL 25 mg daily as taken  pre op. Will also start low dose ACE for better BP control. 2.  Pulmonary - On room air. Encourage incentive spirometer. 3. Volume Overload - Will give 40 mg PO two times today. 4.  GI-Tolerating heart healthy/carb modified diet. 5. Will not restart Zocor as is on Amiodarone and has allergy to Lipitor 6. Will need CIR or SNF when ready for discharge and will discuss with Dr. Prescott Gum timing of discharge. He is cleared from general surgery standpoint for discharge.  Rakhi Romagnoli MPA-C 09/09/2015,7:27 AM

## 2015-09-09 NOTE — Discharge Instructions (Signed)
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 These instructions provide you with information on caring for yourself after your procedure. Your health care provider may also give you more specific instructions. Your treatment has been planned according to current medical practices, but problems sometimes occur. Call your health care provider if you have any problems or questions after your procedure. WHAT TO EXPECT AFTER THE PROCEDURE Recovery from surgery will be different for everyone. Some people feel well after 3 or 4 weeks, while for others it takes longer. After your procedure, it is typical to have the following:  Nausea and a lack of appetite.   Constipation.  Weakness and fatigue.   Depression or irritability.   Pain or discomfort at your incision site. HOME CARE INSTRUCTIONS  Take medicines only as directed by your health care provider. Do not stop taking medicines or start any new medicines without first checking with your health care provider.  Take your pulse as directed by your health care provider.  Perform deep breathing as directed by your health care provider. If you were given a device called an incentive spirometer, use it to practice deep breathing several times a day. Support your chest with a pillow or your arms when you take deep breaths or cough.  Keep incision areas clean, dry, and protected. Remove or change any bandages (dressings) only as directed by your health care provider. You may have skin adhesive strips over the incision areas. Do not take the strips off. They will fall off on their own.  Check incision areas daily for any swelling, redness, or drainage.  If incisions were made in your legs, do the following:  Avoid crossing your legs.   Avoid sitting for long periods of time. Change positions every 30 minutes.   Elevate your legs when you are sitting.  Wear compression stockings as directed by your  health care provider. These stockings help keep blood clots from forming in your legs.  Take showers once your health care provider approves. Until then, only take sponge baths. Pat incisions dry. Do not rub incisions with a washcloth or towel. Do not take baths, swim, or use a hot tub until your health care provider approves.  Eat foods that are high in fiber, such as raw fruits and vegetables, whole grains, beans, and nuts. Meats should be lean cut. Avoid canned, processed, and fried foods.  Drink enough fluid to keep your urine clear or pale yellow.  Weigh yourself every day. This helps identify if you are retaining fluid that may make your heart and lungs work harder.  Rest and limit activity as directed by your health care provider. You may be instructed to:  Stop any activity at once if you have chest pain, shortness of breath, irregular heartbeats, or dizziness. Get help right away if you have any of these symptoms.  Move around frequently for short periods or take short walks as directed by your health care provider. Increase your activities gradually. You may need physical therapy or cardiac rehabilitation to help strengthen your muscles and build your endurance.  Avoid lifting, pushing, or pulling anything heavier than 10 lb (4.5 kg) for at least 6 weeks after surgery.  Do not drive until your health care provider approves.  Ask your health care provider when you may return to work.  Ask your health care provider when you may resume sexual activity.  Keep all follow-up visits as directed by your health care  provider. This is important. SEEK MEDICAL CARE IF:  You have swelling, redness, increasing pain, or drainage at the site of an incision.  You have a fever.  You have swelling in your ankles or legs.  You have pain in your legs.   You gain 2 or more pounds (0.9 kg) a day.  You are nauseous or vomit.  You have diarrhea. SEEK IMMEDIATE MEDICAL CARE IF:  You have  chest pain that goes to your jaw or arms.  You have shortness of breath.   You have a fast or irregular heartbeat.   You notice a "clicking" in your breastbone (sternum) when you move.   You have numbness or weakness in your arms or legs.  You feel dizzy or light-headed.  MAKE SURE YOU:  Understand these instructions.  Will watch your condition.  Will get help right away if you are not doing well or get worse.   This information is not intended to replace advice given to you by your health care provider. Make sure you discuss any questions you have with your health care provider.   Document Released: 10/21/2004 Document Revised: 04/24/2014 Document Reviewed: 09/10/2012 Elsevier Interactive Patient Education 2016 [place]: POST OP INSTRUCTIONS  1. DIET: Follow a light bland diet the first 24 hours after arrival home, such as soup, liquids, crackers, etc.  Be sure to include lots of fluids daily.  Avoid fast food or heavy meals as your are more likely to get nauseated.  Eat a low fat the next few days after surgery.   2. Take your usually prescribed home medications unless otherwise directed. 3. PAIN CONTROL: a. Pain is best controlled by a usual combination of three different methods TOGETHER: i. Ice/Heat ii. Over the counter pain medication iii. Prescription pain medication b. Most patients will experience some swelling and bruising around the incisions.  Ice packs or heating pads (30-60 minutes up to 6 times a day) will help. Use ice for the first few days to help decrease swelling and bruising, then switch to heat to help relax tight/sore spots and speed recovery.  Some people prefer to use ice alone, heat alone, alternating between ice & heat.  Experiment to what works for you.  Swelling and bruising can take several weeks to resolve.   c. It is helpful to take an over-the-counter pain medication regularly for the first few weeks.  Choose one of the  following that works best for you: i. Naproxen (Aleve, etc)  Two 220mg  tabs twice a day ii. Ibuprofen (Advil, etc) Three 200mg  tabs four times a day (every meal & bedtime) iii. Acetaminophen (Tylenol, etc) 500-650mg  four times a day (every meal & bedtime) d. A  prescription for pain medication (such as oxycodone, hydrocodone, etc) should be given to you upon discharge.  Take your pain medication as prescribed.  i. If you are having problems/concerns with the prescription medicine (does not control pain, nausea, vomiting, rash, itching, etc), please call us [phone] to see if we need to switch you to a different pain medicine that will work better for you and/or control your side effect better. ii. If you need a refill on your pain medication, please contact your pharmacy.  They will contact our office to request authorization. Prescriptions will not be filled after 5 pm or on week-ends. 4. Avoid getting constipated.  Between the surgery and the pain medications, it is common to experience some constipation.  Increasing fluid intake and taking a  fiber supplement (such as Metamucil, Citrucel, FiberCon, MiraLax, etc) 1-2 times a day regularly will usually help prevent this problem from occurring.  A mild laxative (prune juice, Milk of Magnesia, MiraLax, etc) should be taken according to package directions if there are no bowel movements after 48 hours.   5. Watch out for diarrhea.  If you have many loose bowel movements, simplify your diet to bland foods & liquids for a few days.  Stop any stool softeners and decrease your fiber supplement.  Switching to mild anti-diarrheal medications (Kayopectate, Pepto Bismol) can help.  If this worsens or does not improve, please call us. 6. Wash / shower every day.  You may shower over the incision / wound.  Avoid baths until the skin is fully healed.  Continue to shower over incision(s) after the dressing is off. 7. Remove your waterproof bandages 5 days after  surgery.  You may leave the incision open to air.  You may replace a dressing/Band-Aid to cover the incision for comfort if you wish. 8. ACTIVITIES as tolerated:   a. You may resume regular (light) daily activities beginning the next day--such as daily self-care, walking, climbing stairs--gradually increasing activities as tolerated.  If you can walk 30 minutes without difficulty, it is safe to try more intense activity such as jogging, treadmill, bicycling, low-impact aerobics, swimming, etc. b. Save the most intensive and strenuous activity for last such as sit-ups, heavy lifting, contact sports, etc  Refrain from any heavy lifting or straining until you are off narcotics for pain control.   c. DO NOT PUSH THROUGH PAIN.  Let pain be your guide: If it hurts to do something, don't do it.  Pain is your body warning you to avoid that activity for another week until the pain goes down. d. You may drive when you are no longer taking prescription pain medication, you can comfortably wear a seatbelt, and you can safely maneuver your car and apply brakes. e. Dennis Bast may have sexual intercourse when it is comfortable.  9. FOLLOW UP in our office a. Please call [facility] at [phone] to set up an appointment to see your surgeon in the office for a follow-up appointment approximately 1-2 weeks after your surgery. b. Make sure that you call for this appointment the day you arrive home to insure a convenient appointment time. 10. IF YOU HAVE DISABILITY OR FAMILY LEAVE FORMS, BRING THEM TO THE OFFICE FOR PROCESSING.  DO NOT GIVE THEM TO YOUR DOCTOR.   WHEN TO CALL us [phone]: 1. Poor pain control 2. Reactions / problems with new medications (rash/itching, nausea, etc)  3. Fever over 101.5 F (38.5 C) 4. Inability to urinate 5. Nausea and/or vomiting 6. Worsening swelling or bruising 7. Continued bleeding from incision. 8. Increased pain, redness, or drainage from the incision  The clinic staff is  available to answer your questions during regular business hours (8:30am-5pm).  Please dont hesitate to call and ask to speak to one of our nurses for clinical concerns.   A surgeon from [facility] is always on call at the hospitals   If you have a medical emergency, go to the nearest emergency room or call 911.    [facility], [place], [place], [place], [place]  [address] ? MAIN: [phone] ? TOLL FREE: [phone] ? FAX [phone] [email or web address]

## 2015-09-09 NOTE — Progress Notes (Signed)
Victor Lorie Phenix, MD Physician Signed Physical Medicine and Rehabilitation Consult Note 08/24/2015 6:03 AM  Related encounter: Admission (Current) from 08/19/2015 in [place] Collapse All        Physical Medicine and Rehabilitation Consult Reason for Consult: Deconditioning after CABG Referring Physician: Dr. Nils Pyle   HPI: Victor Diaz is a 70 y.o. right handed male with history of obesity, hypertension, hyperlipidemia, PAF and poor anticoagulation candidate due to alcohol abuse and history of syncope, subclavian artery stenosis status post stenting. Patient lives with son and grandchildren. Independent prior to admission. Limited assistance of family. One level home with basement. Presented 08/19/2015 with episodes of dizziness and presyncope, chest discomfort and increasing dyspnea with exertion. Recent cardiac stress test positive for ischemia and subsequent cardiac catheterization demonstrated severe multivessel coronary artery disease. Echocardiogram with ejection fraction of 45% diffuse hypokinesis. Underwent CABG 4 08/20/2015 per [name] trigt. Hospital course acute blood loss anemia 8.0-8.3 and monitored. Currently maintained on aspirin 81 mg daily after CABG. Subcutaneous Lovenox for DVT prophylaxis. Developed atrial fibrillation 08/22/2015 maintained on intravenous amiodarone and transitioned to Lopressor secondary to nausea from amiodarone. Physical therapy evaluation completed 08/22/2015 with strict sternal precautions and recommendations of physical medicine rehabilitation consult.   Review of Systems  Constitutional: Negative for fever and chills.  HENT: Negative for hearing loss.  Eyes: Negative for blurred vision and double vision.  Respiratory:   Shortness of breath with exertion  Cardiovascular: Positive for chest pain and leg swelling.  Gastrointestinal: Negative for vomiting.  Genitourinary: Positive for  urgency. Negative for dysuria and hematuria.  Skin: Negative for rash.  Neurological: Positive for weakness. Negative for seizures and headaches.   Syncopal episodes  All other systems reviewed and are negative.  Past Medical History  Diagnosis Date  . Allergy   . Gout   . Hyperlipidemia   . Alcohol abuse   . Hemothorax on right   . Lisfranc's dislocation 10/20/2011  . Right fibular fracture 10/20/2011  . Essential hypertension   . PSVT (paroxysmal supraventricular tachycardia) ([facility])   . Paroxysmal atrial flutter ([facility])     Poor anticoagulation candidate with active alcohol abuse and syncope  . Subclavian artery stenosis, left     Status post stent placement by Dr. Trula Slade 10/2014   Past Surgical History  Procedure Laterality Date  . Appendectomy    . Hernia repair    . Tonsilectomy, adenoidectomy, bilateral myringotomy and tubes    . Revision total hip arthroplasty  08/28/2008  . Partial hip arthroplasty Left 2010  . Peripheral vascular catheterization N/A 11/11/2014    Procedure: Aortic Arch Angiography/Left Subclavion Stent; Surgeon: Serafina Mitchell, MD; Location: [facility]; Service: Cardiovascular; Laterality: N/A;  . Cardiac catheterization N/A 08/19/2015    Procedure: Left Heart Cath and Cors/Grafts Angiography; Surgeon: Jettie Booze, MD; Location: [facility]; Service: Cardiovascular; Laterality: N/A;  . Coronary artery bypass graft N/A 08/20/2015    Procedure: CORONARY ARTERY BYPASS GRAFTING (CABG) TIMES FOUR UTILIZING THE LEFT INTERNAL MAMMARY ARTERY AND ENDOSCOPICALLY HARVESTED RIGHT SAPHENEOUS VEINS. CLIPPING OF ATRIAL APPENDAGE; Surgeon: Ivin Poot, MD; Location: [facility]; Service: Open Heart Surgery; Laterality: N/A;  . Tee without cardioversion N/A 08/20/2015    Procedure: TRANSESOPHAGEAL ECHOCARDIOGRAM (TEE); Surgeon: Ivin Poot, MD;  Location: [facility]; Service: Open Heart Surgery; Laterality: N/A;   Family History  Problem Relation Age of Onset  . Arthritis Mother   .  Cerebral aneurysm Mother   . Tuberculosis Father    Social History:  reports that he quit smoking about 46 years ago. His smoking use included Cigarettes. He has a 10 pack-year smoking history. He has never used smokeless tobacco. He reports that he drinks about 37.8 oz of alcohol per week. He reports that he does not use illicit drugs. Allergies:  Allergies  Allergen Reactions  . Atorvastatin Anaphylaxis    LIPITOR - facial swelling   Medications Prior to Admission  Medication Sig Dispense Refill  . aspirin 325 MG tablet Take 325 mg by mouth daily.     . cetirizine (ZYRTEC) 10 MG tablet TAKE ONE TABLET BY MOUTH ONCE DAILY AS NEEDED. (Patient taking differently: Take 10 mg by mouth daily as needed for allergies. TAKE ONE TABLET BY MOUTH ONCE DAILY AS NEEDED.) 60 tablet 0  . metoprolol succinate (TOPROL-XL) 25 MG 24 hr tablet **MUST SCHEDULE APPT FOR FURTHER REFILLS.** (Patient taking differently: Take 25 mg by mouth daily. **MUST SCHEDULE APPT FOR FURTHER REFILLS.**) 180 tablet 0  . nitroGLYCERIN (NITROSTAT) 0.4 MG SL tablet Place 1 tablet (0.4 mg total) under the tongue every 5 (five) minutes as needed. 25 tablet 3  . omeprazole (PRILOSEC) 20 MG capsule TAKE 1 CAPSULE BY MOUTH DAILY AS NEEDED FOR HEART BURN. 90 capsule 1  . simvastatin (ZOCOR) 40 MG tablet TAKE (1) TABLET BY MOUTH AT BEDTIME. 90 tablet 1  . Multiple Vitamin (MULTIVITAMIN WITH MINERALS) TABS tablet Take 1 tablet by mouth daily. 30 tablet 0    Home: Home Living Family/patient expects to be discharged to:: Private residence Living Arrangements: Children Available Help at Discharge: Family, Available 24 hours/day Type of Home: House Home Access: Stairs to enter [employer] of Steps: 2 Entrance Stairs-Rails:  None Home Layout: Laundry or work area in basement, Able to live on main level with bedroom/bathroom Home Equipment: Environmental consultant - 2 wheels, Bedside commode, Cane - single point  Functional History: Prior Function Level of Independence: Independent Functional Status:  Mobility: Bed Mobility General bed mobility comments: pt sitting in recliner. Transfers Overall transfer level: Needs assistance Equipment used: Rolling walker (2 wheeled) Transfers: Sit to/from Stand Sit to Stand: Min assist, +2 physical assistance General transfer comment: cues for UEs to knees and using momentum to come to standing.  Ambulation/Gait Ambulation/Gait assistance: Min assist, +2 safety/equipment Ambulation Distance (Feet): 60 Feet Assistive device: Rolling walker (2 wheeled) Gait Pattern/deviations: Step-through pattern, Decreased stride length General Gait Details: pt tremulous throughout mobility and pt indicates he did this with OOB to chair this am. pt needs cues for slow pursed lip breathing and encouragement. 2nd person present to A with management of equipment and chair follow.     ADL:    Cognition: Cognition Overall Cognitive Status: Within Functional Limits for tasks assessed Orientation Level: Oriented X4 Cognition Arousal/Alertness: Awake/alert Behavior During Therapy: WFL for tasks assessed/performed Overall Cognitive Status: Within Functional Limits for tasks assessed  Blood pressure 139/64, pulse 100, temperature 97.4 F (36.3 C), temperature source Oral, resp. rate 15, height 5\' 6"  (1.676 m), weight 101.3 kg (223 lb 5.2 oz), SpO2 95 %. Physical Exam  Vitals reviewed. Constitutional: He is oriented to person, place, and time. He appears well-developed and well-nourished.  HENT:  Head: Normocephalic and atraumatic.  Eyes: Conjunctivae and EOM are normal.  Neck: Normal range of motion. Neck supple. No thyromegaly present.  Cardiovascular:  Irregularly irregular  Respiratory:  Effort normal and breath sounds normal. No respiratory distress.  GI: Soft.  Bowel sounds are normal. He exhibits no distension.  Obese  Musculoskeletal: He exhibits edema. He exhibits no tenderness.  Neurological: He is alert and oriented to person, place, and time.  Follows simple commands Sensation intact light touch DTRs symmetric Motor: Bilateral upper extremities >/4/5 (not fully tested) Bilateral lower extremities: 5/5 proximal to distal  Skin: Skin is warm and dry.  Midline chest incision clean and dry  Psychiatric: He has a normal mood and affect. His behavior is normal.    Lab Results Last 24 Hours    Results for orders placed or performed during the hospital encounter of 08/19/15 (from the past 24 hour(s))  Glucose, capillary Status: Abnormal   Collection Time: 08/23/15 8:28 AM  Result Value Ref Range   Glucose-Capillary 149 (H) 65 - 99 mg/dL   Comment 1 Notify RN   Glucose, capillary Status: Abnormal   Collection Time: 08/23/15 12:24 PM  Result Value Ref Range   Glucose-Capillary 109 (H) 65 - 99 mg/dL   Comment 1 Notify RN   I-STAT, chem 8 Status: Abnormal   Collection Time: 08/23/15 3:53 PM  Result Value Ref Range   Sodium 124 (L) 135 - 145 mmol/L   Potassium 4.5 3.5 - 5.1 mmol/L   Chloride 88 (L) 101 - 111 mmol/L   BUN 11 6 - 20 mg/dL   Creatinine, Ser 0.90 0.61 - 1.24 mg/dL   Glucose, Bld 107 (H) 65 - 99 mg/dL   Calcium, Ion 1.11 (L) 1.13 - 1.30 mmol/L   TCO2 23 0 - 100 mmol/L   Hemoglobin 9.2 (L) 13.0 - 17.0 g/dL   HCT 27.0 (L) 39.0 - 52.0 %  Glucose, capillary Status: None   Collection Time: 08/23/15 5:24 PM  Result Value Ref Range   Glucose-Capillary 98 65 - 99 mg/dL  Glucose, capillary Status: Abnormal   Collection Time: 08/23/15 7:46 PM  Result Value Ref Range   Glucose-Capillary 145 (H) 65 - 99 mg/dL   Comment 1 Notify RN     Comment 2 Document in Chart   Glucose, capillary Status: Abnormal   Collection Time: 08/23/15 9:39 PM  Result Value Ref Range   Glucose-Capillary 124 (H) 65 - 99 mg/dL   Comment 1 Notify RN    Comment 2 Document in Chart   Comprehensive metabolic panel Status: Abnormal   Collection Time: 08/24/15 4:00 AM  Result Value Ref Range   Sodium 124 (L) 135 - 145 mmol/L   Potassium 4.4 3.5 - 5.1 mmol/L   Chloride 90 (L) 101 - 111 mmol/L   CO2 24 22 - 32 mmol/L   Glucose, Bld 99 65 - 99 mg/dL   BUN 11 6 - 20 mg/dL   Creatinine, Ser 1.01 0.61 - 1.24 mg/dL   Calcium 7.9 (L) 8.9 - 10.3 mg/dL   Total Protein 5.8 (L) 6.5 - 8.1 g/dL   Albumin 2.8 (L) 3.5 - 5.0 g/dL   AST 32 15 - 41 U/L   ALT 19 17 - 63 U/L   Alkaline Phosphatase 32 (L) 38 - 126 U/L   Total Bilirubin 0.9 0.3 - 1.2 mg/dL   GFR calc non Af Amer >60 >60 mL/min   GFR calc Af Amer >60 >60 mL/min   Anion gap 10 5 - 15  CBC Status: Abnormal   Collection Time: 08/24/15 4:00 AM  Result Value Ref Range   WBC 10.0 4.0 - 10.5 K/uL   RBC 2.51 (L) 4.22 - 5.81 MIL/uL   Hemoglobin 8.3 (L) 13.0 -  17.0 g/dL   HCT 24.3 (L) 39.0 - 52.0 %   MCV 96.8 78.0 - 100.0 fL   MCH 33.1 26.0 - 34.0 pg   MCHC 34.2 30.0 - 36.0 g/dL   RDW 14.3 11.5 - 15.5 %   Platelets 121 (L) 150 - 400 K/uL      Imaging Results (Last 48 hours)    Dg Chest Port 1 View  08/23/2015 CLINICAL DATA: Status post CABG on 20 Aug 2015, left atrial appendage clipping EXAM: PORTABLE CHEST 1 VIEW COMPARISON: Portable chest x-ray of Aug 22, 2015 FINDINGS: The cardiac silhouette remains enlarged. The pulmonary interstitial markings have become less conspicuous. The pulmonary vascularity is not engorged. There is subsegmental atelectasis at the left lung base which has improved. There is a trace of pleural fluid blunting the costophrenic  angles. There is no pneumothorax. The right internal jugular Cordis sheath tip projects over the proximal SVC. The mediastinal drain and left chest tube have been removed. The observed sternal wires are intact. The left atrial appendage clip is in stable position. IMPRESSION: Ongoing improvement in the appearance of the chest with decreased pulmonary interstitial edema. Persistent trace bilateral pleural effusions and mild left lower lobe atelectasis. Electronically Signed By: David Martinique M.D. On: 08/23/2015 07:44     Assessment/Plan: Diagnosis: Deconditioning after CABG Labs and images independently reviewed. Records reviewed and summated above.  1. Does the need for close, 24 hr/day medical supervision in concert with the patient's rehab needs make it unreasonable for this patient to be served in a less intensive setting? Yes 14. Co-Morbidities requiring supervision/potential complications: Obesity ( Body mass index is 36.03 kg/(m^2)., diet and exercise education, encourage weight loss to increase endurance and promote overall health), hyperlipidemia (cont meds), PAF ( poor anticoagulation candidate due to alcohol abuse and history of syncope), subclavian artery stenosis (status post stenting), systolic CHF (Monitor in accordance with increased physical activity and avoid UE resistance excercises), ABLA (transfuse if necessary to ensure appropriate perfusion for increased activity tolerance), Tachycardia (monitor in accordance with pain and increasing activity), tachypnea (monitor RR and O2 Sats with increased physical exertion), HTN (monitor and provide prns in accordance with increased physical exertion and pain), hyponatremia (continue to monitor, treat if necessary), Thrombocytopenia (< 60,000/mm3 no resistive exercise) 2. Due to bladder management, safety, skin/wound care, disease management, pain management and patient education, does the patient require 24 hr/day rehab nursing?  Yes 3. Does the patient require coordinated care of a physician, rehab nurse, PT (1-2 hrs/day, 5 days/week) and OT (1-2 hrs/day, 5 days/week) to address physical and functional deficits in the context of the above medical diagnosis(es)? Yes Addressing deficits in the following areas: balance, endurance, locomotion, strength, transferring, bathing, dressing, toileting and psychosocial support 4. Can the patient actively participate in an intensive therapy program of at least 3 hrs of therapy per day at least 5 days per week? Yes 5. The potential for patient to make measurable gains while on inpatient rehab is excellent 6. Anticipated functional outcomes upon discharge from inpatient rehab are modified independent with PT, modified independent with OT, n/a with SLP. 7. Estimated rehab length of stay to reach the above functional goals is: 7-10 days. 8. Does the patient have adequate social supports and living environment to accommodate these discharge functional goals? Yes 9. Anticipated D/C setting: Home 10. Anticipated post D/C treatments: HH therapy and Home excercise program 11. Overall Rehab/Functional Prognosis: good  RECOMMENDATIONS: This patient's condition is appropriate for continued rehabilitative care in the following  setting: CIR once medically appropriate. Patient has agreed to participate in recommended program. Yes Note that insurance prior authorization may be required for reimbursement for recommended care.  Comment: Rehab Admissions Coordinator to follow up.  Delice Lesch, MD 08/24/2015       Revision History     Date/Time User Provider Type Action   08/24/2015 10:35 AM Victor Lorie Phenix, MD Physician Sign   08/24/2015 6:29 AM Cathlyn Parsons, PA-C Physician Assistant [place]   View Details Report       Routing History     Date/Time From To Method   08/24/2015 10:35 AM Victor Lorie Phenix, MD Dorothyann Peng, NP In [facility]

## 2015-09-09 NOTE — H&P (Signed)
Physical Medicine and Rehabilitation Admission H&P   Chief complaint: Weakness   HPI: Victor SCARCELLA is a 70 y.o. right handed male with history of obesity, hypertension, hyperlipidemia, PAF and poor anticoagulation candidate due to alcohol abuse and history of syncope, subclavian artery stenosis status post stenting. Patient lives with son and grandchildren. Independent prior to admission. Limited assistance of family. One level home with basement. Presented 08/19/2015 with episodes of dizziness and presyncope, chest discomfort and increasing dyspnea with exertion. Recent cardiac stress test positive for ischemia and subsequent cardiac catheterization demonstrated severe multivessel coronary artery disease. Echocardiogram with ejection fraction of 45% diffuse hypokinesis. Underwent CABG 4 08/20/2015 per [name] trigt. Hospital course acute blood loss anemia 8.0-8.3 and monitored. Currently maintained on aspirin 81 mg daily after CABG. Subcutaneous Lovenox for DVT prophylaxis. Developed atrial fibrillation 08/22/2015 maintained on intravenous amiodarone and transitioned to Lopressor secondary to nausea from amiodarone. 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 A nasogastric tube initially placed. Underwent exploratory laparotomy right hemicolectomy with anastomosis 08/25/2015 per [name]. Nasogastric tube is been removed and diet advanced to regular consistency. Physical therapy evaluation completed 08/22/2015 with strict sternal precautions and recommendations of physical medicine rehabilitation consult. Patient was admitted for a comprehensive rehabilitation program  Patient states he drank a 12 pack per day of beer He plans to quit  ROS Constitutional: Negative for fever and chills.  HENT: Negative for hearing loss.  Eyes: Negative for blurred vision and double vision.  Respiratory:   Shortness of breath with exertion  Cardiovascular:  Positive for chest pain and leg swelling.  Gastrointestinal: Negative for vomiting.  Genitourinary: Positive for urgency. Negative for dysuria and hematuria.  Skin: Negative for rash.  Neurological: Positive for weakness. Negative for seizures and headaches.   Syncopal episodes  All other systems reviewed and are negative   Past Medical History  Diagnosis Date  . Allergy   . Gout   . Hyperlipidemia   . Alcohol abuse   . Hemothorax on right   . Lisfranc's dislocation 10/20/2011  . Right fibular fracture 10/20/2011  . Essential hypertension   . PSVT (paroxysmal supraventricular tachycardia) ([place])   . Paroxysmal atrial flutter ([facility])     Poor anticoagulation candidate with active alcohol abuse and syncope  . Subclavian artery stenosis, left     Status post stent placement by Dr. Trula Slade 10/2014   Past Surgical History  Procedure Laterality Date  . Appendectomy    . Hernia repair    . Tonsilectomy, adenoidectomy, bilateral myringotomy and tubes    . Revision total hip arthroplasty  08/28/2008  . Partial hip arthroplasty Left 2010  . Peripheral vascular catheterization N/A 11/11/2014    Procedure: Aortic Arch Angiography/Left Subclavion Stent; Surgeon: Serafina Mitchell, MD; Location: [place] [facility]; Service: Cardiovascular; Laterality: N/A;  . Cardiac catheterization N/A 08/19/2015    Procedure: Left Heart Cath and Cors/Grafts Angiography; Surgeon: Jettie Booze, MD; Location: [place] [facility]; Service: Cardiovascular; Laterality: N/A;  . Coronary artery bypass graft N/A 08/20/2015    Procedure: CORONARY ARTERY BYPASS GRAFTING (CABG) TIMES FOUR UTILIZING THE LEFT INTERNAL MAMMARY ARTERY AND ENDOSCOPICALLY HARVESTED RIGHT SAPHENEOUS VEINS. CLIPPING OF ATRIAL APPENDAGE; Surgeon: Ivin Poot, MD; Location: [place]; Service: Open Heart Surgery; Laterality: N/A;  . Tee without  cardioversion N/A 08/20/2015    Procedure: TRANSESOPHAGEAL ECHOCARDIOGRAM (TEE); Surgeon: Ivin Poot, MD; Location: [place]; Service: Open Heart Surgery; Laterality: N/A;  . Laparotomy N/A 08/25/2015  Procedure: EXPLORATORY LAPAROTOMY; Surgeon: Ralene Ok, MD; Location: [place]; Service: General; Laterality: N/A;  . Partial colectomy N/A 08/25/2015    Procedure: ASCENDING AND TRANSVERSE COLECTOMY; Surgeon: Ralene Ok, MD; Location: [place]; Service: General; Laterality: N/A;   Family History  Problem Relation Age of Onset  . Arthritis Mother   . Cerebral aneurysm Mother   . Tuberculosis Father    Social History:  reports that he quit smoking about 46 years ago. His smoking use included Cigarettes. He has a 10 pack-year smoking history. He has never used smokeless tobacco. He reports that he drinks about 37.8 oz of alcohol per week. He reports that he does not use illicit drugs. Allergies:  Allergies  Allergen Reactions  . Atorvastatin Anaphylaxis    LIPITOR - facial swelling   Medications Prior to Admission  Medication Sig Dispense Refill  . aspirin 325 MG tablet Take 325 mg by mouth daily.     . cetirizine (ZYRTEC) 10 MG tablet TAKE ONE TABLET BY MOUTH ONCE DAILY AS NEEDED. (Patient taking differently: Take 10 mg by mouth daily as needed for allergies. TAKE ONE TABLET BY MOUTH ONCE DAILY AS NEEDED.) 60 tablet 0  . metoprolol succinate (TOPROL-XL) 25 MG 24 hr tablet **MUST SCHEDULE APPT FOR FURTHER REFILLS.** (Patient taking differently: Take 25 mg by mouth daily. **MUST SCHEDULE APPT FOR FURTHER REFILLS.**) 180 tablet 0  . nitroGLYCERIN (NITROSTAT) 0.4 MG SL tablet Place 1 tablet (0.4 mg total) under the tongue every 5 (five) minutes as needed. 25 tablet 3  . omeprazole (PRILOSEC) 20 MG capsule TAKE 1 CAPSULE BY MOUTH DAILY AS NEEDED FOR HEART BURN. 90 capsule 1  . simvastatin (ZOCOR) 40 MG  tablet TAKE (1) TABLET BY MOUTH AT BEDTIME. 90 tablet 1  . Multiple Vitamin (MULTIVITAMIN WITH MINERALS) TABS tablet Take 1 tablet by mouth daily. 30 tablet 0    Home: Home Living Family/patient expects to be discharged to:: Private residence Living Arrangements: Children Available Help at Discharge: Family, Available 24 hours/day Type of Home: House Home Access: Stairs to enter Technical brewer of Steps: 2 Entrance Stairs-Rails: None Home Layout: Laundry or work area in basement, Able to live on main level with bedroom/bathroom Home Equipment: Environmental consultant - 2 wheels, Bedside commode, Cane - single point  Functional History: Prior Function Level of Independence: Independent  Functional Status:  Mobility: Bed Mobility Overal bed mobility: Needs Assistance Bed Mobility: Rolling, Sidelying to Sit Rolling: Min assist Sidelying to sit: Mod assist, +2 for physical assistance, HOB elevated Sit to sidelying: Mod assist General bed mobility comments: up when PT arrived Transfers Overall transfer level: Needs assistance Equipment used: 4-wheeled walker Transfers: Sit to/from Stand, Stand Pivot Transfers Sit to Stand: Min guard Stand pivot transfers: Min guard General transfer comment: Held pillow and was able to power up with LEs only. Did rock for momentum Ambulation/Gait Ambulation/Gait assistance: Min guard, Min assist (had one moment of unsteadiness) Ambulation Distance (Feet): 180 Feet Assistive device: 4-wheeled walker Gait Pattern/deviations: Step-through pattern, Wide base of support, Drifts right/left General Gait Details: Pt needed postural prompts, safety instructions and reminders about brakes and transitions. SOB and needed reminders for pace, standing rests Gait velocity: reduced Gait velocity interpretation: Below normal speed for age/gender    ADL:    Cognition: Cognition Overall Cognitive Status: Within Functional Limits for tasks  assessed Orientation Level: Oriented X4 Cognition Arousal/Alertness: Awake/alert Behavior During Therapy: WFL for tasks assessed/performed Overall Cognitive Status: Within Functional Limits for tasks assessed  Physical Exam: Blood pressure  149/73, pulse 88, temperature 98 F (36.7 C), temperature source Oral, resp. rate 17, height 5\' 6"  (1.676 m), weight 99.7 kg (219 lb 12.8 oz), SpO2 97 %.   Constitutional: He is oriented to person, place, and time. He appears well-developed and well-nourished.  HENT:  Head: Normocephalic and atraumatic.  Eyes: Conjunctivae and EOM are normal.  Neck: Normal range of motion. Neck supple. No thyromegaly present.  Cardiovascular:  Irregularly irregular  Respiratory: Effort normal and breath sounds normal. No respiratory distress.  GI: Soft. Bowel sounds are normal. He exhibits no distension.  Obese  Musculoskeletal: He exhibits edema. He exhibits no tenderness.  Neurological: He is alert and oriented to person, place, and time.  Follows simple commands Sensation intact light touch In both lower limbs DTRs symmetric Motor: 4/5 bilateral deltoids, biceps, triceps, grip Bilateral lower extremities: 5 minus/5 in the hip flexors and knee extensors ankle dorsiflexors and plantar flexors  No evidence of spasticity in the lower limbs or upper limbs. Skin: Skin is warm and dry. Abdominal incision has Steri-Strips, dried blood midline Midline chest incision clean and dry  Psychiatric: He has a normal mood and affect. His behavior is normal   Lab Results Last 48 Hours    Results for orders placed or performed during the hospital encounter of 08/19/15 (from the past 48 hour(s))  Glucose, capillary Status: Abnormal   Collection Time: 09/07/15 1:03 PM  Result Value Ref Range   Glucose-Capillary 177 (H) 65 - 99 mg/dL   Comment 1 Notify RN    Comment 2 Document in Chart   Glucose, capillary Status: Abnormal    Collection Time: 09/07/15 5:56 PM  Result Value Ref Range   Glucose-Capillary 114 (H) 65 - 99 mg/dL  Glucose, capillary Status: None   Collection Time: 09/08/15 1:41 AM  Result Value Ref Range   Glucose-Capillary 87 65 - 99 mg/dL  Glucose, capillary Status: Abnormal   Collection Time: 09/08/15 6:14 AM  Result Value Ref Range   Glucose-Capillary 103 (H) 65 - 99 mg/dL      Imaging Results (Last 48 hours)    No results found.       Medical Problem List and Plan: 1. Debilitation secondary to CABG 08/20/2015 as well as postoperative ileus with cecal volvulus status post right hemicolectomy 08/25/2015. Patient with sternal precautions after CABG 2. DVT Prophylaxis/Anticoagulation: Subcutaneous Lovenox. Monitor platelet counts and any signs of bleeding 3. Pain Management: All TRAM as needed 4. Mood: Ativan 0.5 mg daily at bedtime 5. Neuropsych: This patient is capable of making decisions on his own behalf. 6. Skin/Wound Care: Routine skin checks 7. Fluids/Electrolytes/Nutrition: Routine I&O's with follow-up chemistries 8. Acute blood loss anemia. Follow-up CBC 9. Hypertension/atrial fibrillation. Amiodarone 200 mg twice a day, lisinopril 2.5 mg daily, Lasix 40 mg twice a day, Toprol 15 mg daily. Cardiac rate controlled. Monitor with increased mobility 10. History of syncope with subclavian artery stenosis status post stenting. Check orthostatic blood pressures 11. History of alcohol abuse. Counseling 12. Hyperlipidemia. No Zocor while on amiodarone. Patient has allergy to Lipitor   Post Admission Physician Evaluation: 1. Functional deficits secondary to Deconditioning. 2. Patient is admitted to receive collaborative, interdisciplinary care between the physiatrist, rehab nursing staff, and therapy team. 3. Patient's level of medical complexity and substantial therapy needs in context of that medical necessity cannot be provided at a lesser  intensity of care such as a SNF. 4. Patient has experienced substantial functional loss from his/her baseline which was documented above under the "  Functional History" and "Functional Status" headings. Judging by the patient's diagnosis, physical exam, and functional history, the patient has potential for functional progress which will result in measurable gains while on inpatient rehab. These gains will be of substantial and practical use upon discharge in facilitating mobility and self-care at the household level. 5. Physiatrist will provide 24 hour management of medical needs as well as oversight of the therapy plan/treatment and provide guidance as appropriate regarding the interaction of the two. 6. 24 hour rehab nursing will assist with bladder management, bowel management, safety, skin/wound care, disease management, medication administration, pain management and patient education and help integrate therapy concepts, techniques,education, etc. 7. PT will assess and treat for/with: pre gait, gait training, endurance , safety, equipment, neuromuscular re education. Goals are: Modified independent. 8. OT will assess and treat for/with: ADLs, Cognitive perceptual skills, Neuromuscular re education, safety, endurance, equipment. Goals are: Modified independent. Therapy may proceed with showering this patient. 9. SLP will assess and treat for/with: NA. Goals are: NA. 10. Case Management and Social Worker will assess and treat for psychological issues and discharge planning. 11. Team conference will be held weekly to assess progress toward goals and to determine barriers to discharge. 12. Patient will receive at least 3 hours of therapy per day at least 5 days per week. 13. ELOS: 5-7 days  14. Prognosis: good     Charlett Blake M.D. [facility] FAAPM&R (Sports Med, Neuromuscular Med) Diplomate Am Board of Electrodiagnostic Med  09/09/2015

## 2015-09-10 ENCOUNTER — Inpatient Hospital Stay (HOSPITAL_COMMUNITY): Payer: Medicare Other | Admitting: Occupational Therapy

## 2015-09-10 ENCOUNTER — Inpatient Hospital Stay (HOSPITAL_COMMUNITY): Payer: Medicare Other | Admitting: Physical Therapy

## 2015-09-10 DIAGNOSIS — R5381 Other malaise: Secondary | ICD-10-CM

## 2015-09-10 DIAGNOSIS — D62 Acute posthemorrhagic anemia: Secondary | ICD-10-CM

## 2015-09-10 DIAGNOSIS — E871 Hypo-osmolality and hyponatremia: Secondary | ICD-10-CM

## 2015-09-10 LAB — CBC WITH DIFFERENTIAL/PLATELET
BASOS ABS: 0 10*3/uL (ref 0.0–0.1)
BASOS PCT: 1 %
Eosinophils Absolute: 0.1 10*3/uL (ref 0.0–0.7)
Eosinophils Relative: 2 %
HEMATOCRIT: 29.2 % — AB (ref 39.0–52.0)
HEMOGLOBIN: 9.4 g/dL — AB (ref 13.0–17.0)
Lymphocytes Relative: 29 %
Lymphs Abs: 1.6 10*3/uL (ref 0.7–4.0)
MCH: 29.7 pg (ref 26.0–34.0)
MCHC: 32.2 g/dL (ref 30.0–36.0)
MCV: 92.4 fL (ref 78.0–100.0)
MONOS PCT: 10 %
Monocytes Absolute: 0.6 10*3/uL (ref 0.1–1.0)
NEUTROS ABS: 3.1 10*3/uL (ref 1.7–7.7)
NEUTROS PCT: 58 %
Platelets: 264 10*3/uL (ref 150–400)
RBC: 3.16 MIL/uL — AB (ref 4.22–5.81)
RDW: 15.2 % (ref 11.5–15.5)
WBC: 5.4 10*3/uL (ref 4.0–10.5)

## 2015-09-10 LAB — COMPREHENSIVE METABOLIC PANEL
ALT: 34 U/L (ref 17–63)
ANION GAP: 8 (ref 5–15)
AST: 57 U/L — ABNORMAL HIGH (ref 15–41)
Albumin: 2.8 g/dL — ABNORMAL LOW (ref 3.5–5.0)
Alkaline Phosphatase: 77 U/L (ref 38–126)
BUN: 15 mg/dL (ref 6–20)
CALCIUM: 8.4 mg/dL — AB (ref 8.9–10.3)
CHLORIDE: 97 mmol/L — AB (ref 101–111)
CO2: 24 mmol/L (ref 22–32)
CREATININE: 1.1 mg/dL (ref 0.61–1.24)
Glucose, Bld: 108 mg/dL — ABNORMAL HIGH (ref 65–99)
POTASSIUM: 4.3 mmol/L (ref 3.5–5.1)
SODIUM: 129 mmol/L — AB (ref 135–145)
TOTAL PROTEIN: 7 g/dL (ref 6.5–8.1)
Total Bilirubin: 0.6 mg/dL (ref 0.3–1.2)

## 2015-09-10 MED ORDER — SODIUM CHLORIDE 0.9% FLUSH: 10.0000 mL | INTRAVENOUS | Status: DC | PRN

## 2015-09-10 NOTE — IPOC Note (Signed)
Overall Plan of Care [facility]) Patient Details Name: Victor Diaz MRN: [id number] DOB: 1946/02/04  Admitting Diagnosis: [facility] Problems: Active Problems:   Debilitated     Functional Problem List: Nursing Bladder, Edema, Endurance, Motor, Safety, Skin Integrity  PT Balance, Edema, Pain, Perception, Safety  OT Balance, Edema, Endurance, Motor, Pain, Skin Integrity, Safety  SLP    TR         Basic ADL's: OT Grooming, Bathing, Dressing, Toileting     Advanced  ADL's: OT Simple Meal Preparation     Transfers: PT Bed to Chair, Car, Furniture, Floor  OT Toilet, Tub/Shower     Locomotion: PT Ambulation, Stairs     Additional Impairments: OT None  SLP        TR      Anticipated Outcomes Item Anticipated Outcome  Self Feeding n/a  Swallowing      Basic self-care  mod I   Toileting  mod I    Bathroom Transfers mod I   Bowel/Bladder  Continent to bowel and bladder with m,in. assist.  Transfers  Mod I with LRAD  Locomotion  Mod I with LRAD  Communication     Cognition     Pain  Less than 20m,on 1 to 10 scale.  Safety/Judgment  Free from, falls during his stay in rehab.   Therapy Plan: PT Intensity: Minimum of 1-2 x/day ,45 to 90 minutes PT Frequency: 5 out of 7 days PT Duration Estimated Length of Stay: 5-7 days  OT Intensity: Minimum of 1-2 x/day, 45 to 90 minutes OT Frequency: 5 out of 7 days OT Duration/Estimated Length of Stay: ~ 5 days         Team Interventions: Nursing Interventions Patient/Family Education, Bladder Management, Disease Management/Prevention, Skin Care/Wound Management, Discharge Planning  PT interventions Ambulation/gait training, Balance/vestibular training, Cognitive remediation/compensation, Discharge planning, Disease management/prevention, DME/adaptive equipment instruction, Community reintegration, Functional mobility training, Neuromuscular re-education, Pain management, Patient/family education, Psychosocial  support, Skin care/wound management, Stair training, Therapeutic Activities, Therapeutic Exercise, UE/LE Strength taining/ROM, UE/LE Coordination activities, Wheelchair propulsion/positioning  OT Interventions Training and development officer, Community reintegration, Discharge planning, Disease mangement/prevention, Pain management, UE/LE Coordination activities, Skin care/wound managment, Functional mobility training, Self Care/advanced ADL retraining, UE/LE Strength taining/ROM, Therapeutic Exercise, Functional electrical stimulation, Psychosocial support, DME/adaptive equipment instruction, Therapeutic Activities, Patient/family education  SLP Interventions    TR Interventions    SW/CM Interventions Discharge Planning, Psychosocial Support, Patient/Family Education    Team Discharge Planning: Destination: PT-Home ,OT- Home , SLP-  Projected Follow-up: PT-Home health PT, Outpatient PT, OT-  None, SLP-  Projected Equipment Needs: PT-Other (comment) (Rollator), OT- To be determined, SLP-  Equipment Details: PT- , OT-  Patient/family involved in discharge planning: PT- Patient,  OT-Patient, SLP-   MD ELOS: 5-7 days.  Medical Rehab Prognosis:  Excellent Assessment:  70 y.o. right handed male with history of obesity, hypertension, hyperlipidemia, PAF and poor anticoagulation candidate due to alcohol abuse and history of syncope, subclavian artery stenosis status post stenting. Independent prior to admission. Limited assistance of family. One level home with basement. Presented 08/19/2015 with episodes of dizziness and presyncope, chest discomfort and increasing dyspnea with exertion. Recent cardiac stress test positive for ischemia and subsequent cardiac catheterization demonstrated severe multivessel coronary artery disease. Echocardiogram with ejection fraction of 45% diffuse hypokinesis. Underwent CABG 4 08/20/2015 per [name] trigt. Hospital course acute blood loss anemia 8.0-8.3 and monitored.  Maintained on aspirin 81 mg daily after CABG. Subcutaneous Lovenox for DVT prophylaxis. Developed atrial fibrillation  08/22/2015 maintained on intravenous amiodarone and transitioned to Lopressor secondary to nausea from amiodarone. 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 A nasogastric tube initially placed. Underwent exploratory laparotomy right hemicolectomy with anastomosis 08/25/2015 per [name]. Nasogastric tube is been removed and diet advanced to regular consistency. Physical therapy evaluation completed 08/22/2015 with strict sternal precautions and limitations in generalized weakness and deconditioning.  Will set goals for Mod I with therapies.  See Team Conference Notes for weekly updates to the plan of care

## 2015-09-10 NOTE — Progress Notes (Signed)
Patient information reviewed and entered into eRehab system by Krishana Lutze, RN, CRRN, PPS Coordinator.  Information including medical coding and functional independence measure will be reviewed and updated through discharge.     Per nursing patient was given "Data Collection Information Summary for Patients in Inpatient Rehabilitation Facilities with attached "Privacy Act Statement-Health Care Records" upon admission.  

## 2015-09-10 NOTE — Interval H&P Note (Signed)
Victor Diaz was admitted yesterday to Inpatient Rehabilitation with the diagnosis of CABG 08/20/2015 as well as postoperative ileus with cecal volvulus status post right hemicolectomy 08/25/2015.  The patient's history has been reviewed, patient examined, and there is no change in status.  Patient continues to be appropriate for intensive inpatient rehabilitation.  I have reviewed the patient's chart and labs.  Questions were answered to the patient's satisfaction. The PAPE has been reviewed and assessment remains appropriate.  [name] 09/10/2015, 8:28 AM

## 2015-09-10 NOTE — Progress Notes (Signed)
[place] PHYSICAL MEDICINE & REHABILITATION     PROGRESS NOTE  Subjective/Complaints:  Pt sitting up in his ready to begin his day of therapies.  He states he slept "really good". He has questions about his labs and his discharge medications.  ROS: Denies CP, SOB, nausea, vomiting, diarrhea.  Objective: Vital Signs: Blood pressure 137/58, pulse 82, temperature 97.5 F (36.4 C), temperature source Oral, resp. rate 18, height 5\' 6"  (1.676 m), weight 89.903 kg (198 lb 3.2 oz), SpO2 98 %. No results found.  Recent Labs  09/09/15 1708 09/10/15 0645  WBC 5.3 5.4  HGB 9.3* 9.4*  HCT 29.2* 29.2*  PLT 254 264    Recent Labs  09/09/15 1708 09/10/15 0645  NA  --  129*  K  --  4.3  CL  --  97*  GLUCOSE  --  108*  BUN  --  15  CREATININE 1.12 1.10  CALCIUM  --  8.4*   CBG (last 3)   Recent Labs  09/07/15 1756 09/08/15 0141 09/08/15 0614  GLUCAP 114* 87 103*    Wt Readings from Last 3 Encounters:  09/10/15 89.903 kg (198 lb 3.2 oz)  09/09/15 99.7 kg (219 lb 12.8 oz)  08/13/15 92.987 kg (205 lb)    Physical Exam:  BP 137/58 mmHg  Pulse 82  Temp(Src) 97.5 F (36.4 C) (Oral)  Resp 18  Ht 5\' 6"  (1.676 m)  Wt 89.903 kg (198 lb 3.2 oz)  BMI 32.01 kg/m2  SpO2 98% Constitutional: He appears well-developed and well-nourished. NAD. HENT: Normocephalic and atraumatic.  Eyes: Conjunctivae and EOM are normal.  Neck: Normal range of motion. Neck supple. No thyromegaly present.  Cardiovascular: Irregularly irregular  Respiratory: Effort normal and breath sounds normal. No respiratory distress.  GI: Soft. Bowel sounds are normal. He exhibits no distension. Obese  Musculoskeletal: He exhibits edema. He exhibits no tenderness. Neurological: He is alert and oriented.  Follows simple commands Motor: 4-4+/5 bilateral deltoids, biceps, triceps, grip Bilateral lower extremities: 4/5 in the hip flexors and knee extensors, 4+ ankle dorsiflexors and plantar flexors Skin:  Skin is warm and dry. Incision c/d/i.  Midline chest incision clean and dry  Psychiatric: He has a normal mood and affect. His behavior is normal   Assessment/Plan: 1. Functional deficits secondary to deconditioning which require 3+ hours per day of interdisciplinary therapy in a comprehensive inpatient rehab setting. Physiatrist is providing close team supervision and 24 hour management of active medical problems listed below. Physiatrist and rehab team continue to assess barriers to discharge/monitor patient progress toward functional and medical goals.  Function:  Bathing Bathing position      Bathing parts      Bathing assist        Upper Body Dressing/Undressing Upper body dressing                    Upper body assist        Lower Body Dressing/Undressing Lower body dressing                                  Lower body assist        Toileting Toileting          Toileting assist     Transfers Chair/bed Clinical biochemist  Cognition Comprehension Comprehension assist level: Follows complex conversation/direction with no assist, Follows basic conversation/direction with no assist  Expression Expression assist level: Expresses basic needs/ideas: With no assist  Social Interaction Social Interaction assist level: Interacts appropriately with others with medication or extra time (anti-anxiety, antidepressant). (ativan)  Problem Solving Problem solving assist level: Solves basic problems with no assist  Memory Memory assist level: Complete Independence: No helper    Medical Problem List and Plan: 1. Debilitation secondary to CABG 08/20/2015 as well as postoperative ileus with cecal volvulus status post right hemicolectomy 08/25/2015.  Patient with sternal precautions after CABG  Begin CIR 2. DVT Prophylaxis/Anticoagulation: Subcutaneous Lovenox. Monitor platelet counts and any  signs of bleeding 3. Pain Management: Ultram as needed 4. Mood: Ativan 0.5 mg daily at bedtime 5. Neuropsych: This patient is capable of making decisions on his own behalf. 6. Skin/Wound Care: Routine skin checks 7. Fluids/Electrolytes/Nutrition: Routine I&O's 8. Acute blood loss anemia.   Hb 9.4 on 5/26, stable   Will cont to monitor  Labs ordered for Monday 9. Hypertension/atrial fibrillation. Amiodarone 200 mg twice a day, lisinopril 2.5 mg daily, Lasix 40 mg twice a day, Toprol 15 mg daily. Cardiac rate controlled. Monitor with increased mobility 10. History of syncope with subclavian artery stenosis status post stenting. Check orthostatic blood pressures 11. History of alcohol abuse. Counseling 12. Hyperlipidemia. No Zocor while on amiodarone. Patient has allergy to Lipitor 13. Hyponatremia:  Na 129 on 5/26, appears to be stable  Will cont to monitor  Labs ordered for Monday  LOS (Days) 1 A FACE TO FACE EVALUATION WAS PERFORMED  Victor Diaz Lorie Phenix 09/10/2015 8:29 AM

## 2015-09-10 NOTE — Progress Notes (Signed)
Occupational Therapy Session Note  Patient Details  Name: Victor Diaz MRN: [id number] Date of Birth: April 29, 1945  Today's Date: 09/10/2015 OT Individual Time: 1400-1458 OT Individual Time Calculation (min): 58 min   Skilled Therapeutic Interventions/Progress Updates:    Pt completed dressing to begin session as pt's son and girlfriend brought in clothing and shoes.  Pt was able to donn his underpants and pants in sitting with min steady assist for sit to stand to pull them over his hips.  He crossed his LEs to donn socks, even though he initially stated he couldn't do it.  He used one handed technique on the left but was able to use both hands on the right.  Pt reported having a shoe horn at home to help him donn slip on shoes as none of his tied.  However, his son bought him a pair of lace up sneakers to use in therapy.  Provided pt with education on use of reach and shoe funnel use while tying shoes first.  After instruction he was able to donn with supervision.  Completed toileting with min assist during session.  Overall min instructional cueing to remember to lock Rollator brakes before attempting to sit down.  Pt worked on sit to stand transitions during session as well without use of UEs on therapy mat.  Still with decreased forward trunk flexion with transition to standing from sitting and sitting from standing, requiring min assist at times secondary to posterior lean.  Pt left in bedside chair at end of session with call button in place and urinal.    Therapy Documentation Precautions:  Precautions Precautions: Fall, Sternal Restrictions Weight Bearing Restrictions: No  Pain: Pain Assessment Pain Assessment: No/denies pain Pain Score: 3  Pain Location: Abdomen Pain Orientation: Mid Pain Descriptors / Indicators: Aching Pain Frequency: Intermittent Pain Onset: Gradual Patients Stated Pain Goal: 2 Pain Intervention(s): Medication (See eMAR);Repositioned Multiple Pain Sites:  No ADL: See Function Navigator for Current Functional Status.   Therapy/Group: Individual Therapy  Keyle Doby OTR/L 09/10/2015, 4:00 PM

## 2015-09-10 NOTE — H&P (View-Only) (Signed)
Physical Medicine and Rehabilitation Admission H&P   Chief complaint: Weakness   HPI: Victor Diaz is a 70 y.o. right handed male with history of obesity, hypertension, hyperlipidemia, PAF and poor anticoagulation candidate due to alcohol abuse and history of syncope, subclavian artery stenosis status post stenting. Patient lives with son and grandchildren. Independent prior to admission. Limited assistance of family. One level home with basement. Presented 08/19/2015 with episodes of dizziness and presyncope, chest discomfort and increasing dyspnea with exertion. Recent cardiac stress test positive for ischemia and subsequent cardiac catheterization demonstrated severe multivessel coronary artery disease. Echocardiogram with ejection fraction of 45% diffuse hypokinesis. Underwent CABG 4 08/20/2015 per [name] trigt. Hospital course acute blood loss anemia 8.0-8.3 and monitored. Currently maintained on aspirin 81 mg daily after CABG. Subcutaneous Lovenox for DVT prophylaxis. Developed atrial fibrillation 08/22/2015 maintained on intravenous amiodarone and transitioned to Lopressor secondary to nausea from amiodarone. 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 A nasogastric tube initially placed. Underwent exploratory laparotomy right hemicolectomy with anastomosis 08/25/2015 per [name]. Nasogastric tube is been removed and diet advanced to regular consistency. Physical therapy evaluation completed 08/22/2015 with strict sternal precautions and recommendations of physical medicine rehabilitation consult. Patient was admitted for a comprehensive rehabilitation program  Patient states he drank a 12 pack per day of beer He plans to quit  ROS Constitutional: Negative for fever and chills.  HENT: Negative for hearing loss.  Eyes: Negative for blurred vision and double vision.  Respiratory:   Shortness of breath with exertion  Cardiovascular:  Positive for chest pain and leg swelling.  Gastrointestinal: Negative for vomiting.  Genitourinary: Positive for urgency. Negative for dysuria and hematuria.  Skin: Negative for rash.  Neurological: Positive for weakness. Negative for seizures and headaches.   Syncopal episodes  All other systems reviewed and are negative   Past Medical History  Diagnosis Date  . Allergy   . Gout   . Hyperlipidemia   . Alcohol abuse   . Hemothorax on right   . Lisfranc's dislocation 10/20/2011  . Right fibular fracture 10/20/2011  . Essential hypertension   . PSVT (paroxysmal supraventricular tachycardia) ([facility])   . Paroxysmal atrial flutter ([facility])     Poor anticoagulation candidate with active alcohol abuse and syncope  . Subclavian artery stenosis, left     Status post stent placement by Dr. Trula Slade 10/2014   Past Surgical History  Procedure Laterality Date  . Appendectomy    . Hernia repair    . Tonsilectomy, adenoidectomy, bilateral myringotomy and tubes    . Revision total hip arthroplasty  08/28/2008  . Partial hip arthroplasty Left 2010  . Peripheral vascular catheterization N/A 11/11/2014    Procedure: Aortic Arch Angiography/Left Subclavion Stent; Surgeon: Serafina Mitchell, MD; Location: [facility]; Service: Cardiovascular; Laterality: N/A;  . Cardiac catheterization N/A 08/19/2015    Procedure: Left Heart Cath and Cors/Grafts Angiography; Surgeon: Jettie Booze, MD; Location: [facility]; Service: Cardiovascular; Laterality: N/A;  . Coronary artery bypass graft N/A 08/20/2015    Procedure: CORONARY ARTERY BYPASS GRAFTING (CABG) TIMES FOUR UTILIZING THE LEFT INTERNAL MAMMARY ARTERY AND ENDOSCOPICALLY HARVESTED RIGHT SAPHENEOUS VEINS. CLIPPING OF ATRIAL APPENDAGE; Surgeon: Ivin Poot, MD; Location: [facility]; Service: Open Heart Surgery; Laterality: N/A;  . Tee without  cardioversion N/A 08/20/2015    Procedure: TRANSESOPHAGEAL ECHOCARDIOGRAM (TEE); Surgeon: Ivin Poot, MD; Location: [facility]; Service: Open Heart Surgery; Laterality: N/A;  . Laparotomy N/A 08/25/2015  Procedure: EXPLORATORY LAPAROTOMY; Surgeon: Ralene Ok, MD; Location: [facility]; Service: General; Laterality: N/A;  . Partial colectomy N/A 08/25/2015    Procedure: ASCENDING AND TRANSVERSE COLECTOMY; Surgeon: Ralene Ok, MD; Location: [facility]; Service: General; Laterality: N/A;   Family History  Problem Relation Age of Onset  . Arthritis Mother   . Cerebral aneurysm Mother   . Tuberculosis Father    Social History:  reports that he quit smoking about 46 years ago. His smoking use included Cigarettes. He has a 10 pack-year smoking history. He has never used smokeless tobacco. He reports that he drinks about 37.8 oz of alcohol per week. He reports that he does not use illicit drugs. Allergies:  Allergies  Allergen Reactions  . Atorvastatin Anaphylaxis    LIPITOR - facial swelling   Medications Prior to Admission  Medication Sig Dispense Refill  . aspirin 325 MG tablet Take 325 mg by mouth daily.     . cetirizine (ZYRTEC) 10 MG tablet TAKE ONE TABLET BY MOUTH ONCE DAILY AS NEEDED. (Patient taking differently: Take 10 mg by mouth daily as needed for allergies. TAKE ONE TABLET BY MOUTH ONCE DAILY AS NEEDED.) 60 tablet 0  . metoprolol succinate (TOPROL-XL) 25 MG 24 hr tablet **MUST SCHEDULE APPT FOR FURTHER REFILLS.** (Patient taking differently: Take 25 mg by mouth daily. **MUST SCHEDULE APPT FOR FURTHER REFILLS.**) 180 tablet 0  . nitroGLYCERIN (NITROSTAT) 0.4 MG SL tablet Place 1 tablet (0.4 mg total) under the tongue every 5 (five) minutes as needed. 25 tablet 3  . omeprazole (PRILOSEC) 20 MG capsule TAKE 1 CAPSULE BY MOUTH DAILY AS NEEDED FOR HEART BURN. 90 capsule 1  . simvastatin (ZOCOR) 40 MG  tablet TAKE (1) TABLET BY MOUTH AT BEDTIME. 90 tablet 1  . Multiple Vitamin (MULTIVITAMIN WITH MINERALS) TABS tablet Take 1 tablet by mouth daily. 30 tablet 0    Home: Home Living Family/patient expects to be discharged to:: Private residence Living Arrangements: Children Available Help at Discharge: Family, Available 24 hours/day Type of Home: House Home Access: Stairs to enter Technical brewer of Steps: 2 Entrance Stairs-Rails: None Home Layout: Laundry or work area in basement, Able to live on main level with bedroom/bathroom Home Equipment: Environmental consultant - 2 wheels, Bedside commode, Cane - single point  Functional History: Prior Function Level of Independence: Independent  Functional Status:  Mobility: Bed Mobility Overal bed mobility: Needs Assistance Bed Mobility: Rolling, Sidelying to Sit Rolling: Min assist Sidelying to sit: Mod assist, +2 for physical assistance, HOB elevated Sit to sidelying: Mod assist General bed mobility comments: up when PT arrived Transfers Overall transfer level: Needs assistance Equipment used: 4-wheeled walker Transfers: Sit to/from Stand, Stand Pivot Transfers Sit to Stand: Min guard Stand pivot transfers: Min guard General transfer comment: Held pillow and was able to power up with LEs only. Did rock for momentum Ambulation/Gait Ambulation/Gait assistance: Min guard, Min assist (had one moment of unsteadiness) Ambulation Distance (Feet): 180 Feet Assistive device: 4-wheeled walker Gait Pattern/deviations: Step-through pattern, Wide base of support, Drifts right/left General Gait Details: Pt needed postural prompts, safety instructions and reminders about brakes and transitions. SOB and needed reminders for pace, standing rests Gait velocity: reduced Gait velocity interpretation: Below normal speed for age/gender    ADL:    Cognition: Cognition Overall Cognitive Status: Within Functional Limits for tasks  assessed Orientation Level: Oriented X4 Cognition Arousal/Alertness: Awake/alert Behavior During Therapy: WFL for tasks assessed/performed Overall Cognitive Status: Within Functional Limits for tasks assessed  Physical Exam: Blood pressure  149/73, pulse 88, temperature 98 F (36.7 C), temperature source Oral, resp. rate 17, height 5\' 6"  (1.676 m), weight 99.7 kg (219 lb 12.8 oz), SpO2 97 %.   Constitutional: He is oriented to person, place, and time. He appears well-developed and well-nourished.  HENT:  Head: Normocephalic and atraumatic.  Eyes: Conjunctivae and EOM are normal.  Neck: Normal range of motion. Neck supple. No thyromegaly present.  Cardiovascular:  Irregularly irregular  Respiratory: Effort normal and breath sounds normal. No respiratory distress.  GI: Soft. Bowel sounds are normal. He exhibits no distension.  Obese  Musculoskeletal: He exhibits edema. He exhibits no tenderness.  Neurological: He is alert and oriented to person, place, and time.  Follows simple commands Sensation intact light touch In both lower limbs DTRs symmetric Motor: 4/5 bilateral deltoids, biceps, triceps, grip Bilateral lower extremities: 5 minus/5 in the hip flexors and knee extensors ankle dorsiflexors and plantar flexors  No evidence of spasticity in the lower limbs or upper limbs. Skin: Skin is warm and dry. Abdominal incision has Steri-Strips, dried blood midline Midline chest incision clean and dry  Psychiatric: He has a normal mood and affect. His behavior is normal   Lab Results Last 48 Hours    Results for orders placed or performed during the hospital encounter of 08/19/15 (from the past 48 hour(s))  Glucose, capillary Status: Abnormal   Collection Time: 09/07/15 1:03 PM  Result Value Ref Range   Glucose-Capillary 177 (H) 65 - 99 mg/dL   Comment 1 Notify RN    Comment 2 Document in Chart   Glucose, capillary Status: Abnormal    Collection Time: 09/07/15 5:56 PM  Result Value Ref Range   Glucose-Capillary 114 (H) 65 - 99 mg/dL  Glucose, capillary Status: None   Collection Time: 09/08/15 1:41 AM  Result Value Ref Range   Glucose-Capillary 87 65 - 99 mg/dL  Glucose, capillary Status: Abnormal   Collection Time: 09/08/15 6:14 AM  Result Value Ref Range   Glucose-Capillary 103 (H) 65 - 99 mg/dL      Imaging Results (Last 48 hours)    No results found.       Medical Problem List and Plan: 1. Debilitation secondary to CABG 08/20/2015 as well as postoperative ileus with cecal volvulus status post right hemicolectomy 08/25/2015. Patient with sternal precautions after CABG 2. DVT Prophylaxis/Anticoagulation: Subcutaneous Lovenox. Monitor platelet counts and any signs of bleeding 3. Pain Management: All TRAM as needed 4. Mood: Ativan 0.5 mg daily at bedtime 5. Neuropsych: This patient is capable of making decisions on his own behalf. 6. Skin/Wound Care: Routine skin checks 7. Fluids/Electrolytes/Nutrition: Routine I&O's with follow-up chemistries 8. Acute blood loss anemia. Follow-up CBC 9. Hypertension/atrial fibrillation. Amiodarone 200 mg twice a day, lisinopril 2.5 mg daily, Lasix 40 mg twice a day, Toprol 15 mg daily. Cardiac rate controlled. Monitor with increased mobility 10. History of syncope with subclavian artery stenosis status post stenting. Check orthostatic blood pressures 11. History of alcohol abuse. Counseling 12. Hyperlipidemia. No Zocor while on amiodarone. Patient has allergy to Lipitor   Post Admission Physician Evaluation: 1. Functional deficits secondary to Deconditioning. 2. Patient is admitted to receive collaborative, interdisciplinary care between the physiatrist, rehab nursing staff, and therapy team. 3. Patient's level of medical complexity and substantial therapy needs in context of that medical necessity cannot be provided at a lesser  intensity of care such as a SNF. 4. Patient has experienced substantial functional loss from his/her baseline which was documented above under the "  Functional History" and "Functional Status" headings. Judging by the patient's diagnosis, physical exam, and functional history, the patient has potential for functional progress which will result in measurable gains while on inpatient rehab. These gains will be of substantial and practical use upon discharge in facilitating mobility and self-care at the household level. 5. Physiatrist will provide 24 hour management of medical needs as well as oversight of the therapy plan/treatment and provide guidance as appropriate regarding the interaction of the two. 6. 24 hour rehab nursing will assist with bladder management, bowel management, safety, skin/wound care, disease management, medication administration, pain management and patient education and help integrate therapy concepts, techniques,education, etc. 7. PT will assess and treat for/with: pre gait, gait training, endurance , safety, equipment, neuromuscular re education. Goals are: Modified independent. 8. OT will assess and treat for/with: ADLs, Cognitive perceptual skills, Neuromuscular re education, safety, endurance, equipment. Goals are: Modified independent. Therapy may proceed with showering this patient. 9. SLP will assess and treat for/with: NA. Goals are: NA. 10. Case Management and Social Worker will assess and treat for psychological issues and discharge planning. 11. Team conference will be held weekly to assess progress toward goals and to determine barriers to discharge. 12. Patient will receive at least 3 hours of therapy per day at least 5 days per week. 13. ELOS: 5-7 days  14. Prognosis: good     Charlett Blake M.D. [facility] FAAPM&R (Sports Med, Neuromuscular Med) Diplomate Am Board of Electrodiagnostic Med  09/09/2015

## 2015-09-10 NOTE — Evaluation (Signed)
Occupational Therapy Assessment and Plan  Patient Details  Name: Victor Diaz MRN: [id number] Date of Birth: 01-30-1946  OT Diagnosis: muscle weakness (generalized) Rehab Potential: Rehab Potential (ACUTE ONLY): Good ELOS: ~ 5 days   Today's Date: 09/10/2015 OT Individual Time:  -        Problem List:  Patient Active Problem List   Diagnosis Date Noted  . Debilitated 09/09/2015  . Pressure ulcer 09/07/2015  . Acute blood loss anemia   . Acute systolic congestive heart failure ([place])   . Coronary artery disease involving native coronary artery of native heart without angina pectoris   . Essential hypertension   . Obesity   . Paroxysmal atrial fibrillation ([facility])   . Subclavian artery stenosis ([place])   . Tachycardia   . Tachypnea   . 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 Unstable angina ([place]) 08/19/2015  . Abnormal myocardial perfusion study   . Hyperlipidemia 01/30/2015  . Faintness   . 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 Thrombocytopenia ([place]) 10/16/2014  . Atrial flutter ([place]) 10/16/2014  . Elevated LFTs 10/16/2014  . Subclavian steal syndrome 10/16/2014  . Subclavian artery stenosis, left 10/16/2014  . Diastolic dysfunction 56/21/3086  . Carotid stenosis 10/16/2014  . Bruit of left carotid artery   . Dehydration   . Syncope 10/14/2014  . ETOH abuse 10/14/2014  . Hyponatremia 10/14/2014  . Metabolic acidosis 57/84/6962  . Chest pain 10/14/2014  . Laceration of head 10/14/2014  . Malnutrition ([place]) 10/14/2014  . Forehead laceration   . Adjustment disorder with mixed anxiety and depressed mood 03/17/2013  . Aortic stenosis 08/03/2011  . Cerebrovascular disease 08/03/2011  . ASCVD (arteriosclerotic cardiovascular disease) 08/03/2011  . Bladder neck obstruction 08/18/2010  . PLEURAL EFFUSION, RIGHT 04/07/2010  . GERD 05/14/2009  . HLD (hyperlipidemia) 12/11/2006  . GOUT 12/11/2006  . DEPENDENCE, ALCOHOL NEC/NOS, UNSPECIFIED 12/11/2006  . ALLERGIC RHINITIS  12/11/2006  . LIVER FUNCTION TESTS, ABNORMAL 12/11/2006    Past Medical History:  Past Medical History  Diagnosis Date  . Allergy   . Gout   . Hyperlipidemia   . Alcohol abuse   . Hemothorax on right   . Lisfranc's dislocation 10/20/2011  . Right fibular fracture 10/20/2011  . Essential hypertension   . PSVT (paroxysmal supraventricular tachycardia) ([place])   . Paroxysmal atrial flutter ([facility])     Poor anticoagulation candidate with active alcohol abuse and syncope  . Subclavian artery stenosis, left     Status post stent placement by Dr. Trula Slade 10/2014   Past Surgical History:  Past Surgical History  Procedure Laterality Date  . Appendectomy    . Hernia repair    . Tonsilectomy, adenoidectomy, bilateral myringotomy and tubes    . Revision total hip arthroplasty  08/28/2008  . Partial hip arthroplasty Left 2010  . Peripheral vascular catheterization N/A 11/11/2014    Procedure: Aortic Arch Angiography/Left Subclavion Stent;  Surgeon: Serafina Mitchell, MD;  Location: [facility];  Service: Cardiovascular;  Laterality: N/A;  . Cardiac catheterization N/A 08/19/2015    Procedure: Left Heart Cath and Cors/Grafts Angiography;  Surgeon: Jettie Booze, MD;  Location: [facility];  Service: Cardiovascular;  Laterality: N/A;  . Coronary artery bypass graft N/A 08/20/2015    Procedure: CORONARY ARTERY BYPASS GRAFTING (CABG) TIMES FOUR  UTILIZING THE LEFT INTERNAL MAMMARY ARTERY AND ENDOSCOPICALLY HARVESTED RIGHT SAPHENEOUS VEINS. CLIPPING OF ATRIAL APPENDAGE;  Surgeon: Ivin Poot, MD;  Location: [facility];  Service: Open Heart Surgery;  Laterality: N/A;  . Tee without cardioversion N/A 08/20/2015    Procedure: TRANSESOPHAGEAL ECHOCARDIOGRAM (TEE);  Surgeon: Ivin Poot, MD;  Location: [facility];  Service: Open Heart Surgery;  Laterality: N/A;  . Laparotomy N/A 08/25/2015    Procedure: EXPLORATORY LAPAROTOMY;  Surgeon: Ralene Ok, MD;  Location: [facility];  Service: General;   Laterality: N/A;  . Partial colectomy N/A 08/25/2015    Procedure: ASCENDING AND TRANSVERSE COLECTOMY;  Surgeon: Ralene Ok, MD;  Location: [facility];  Service: General;  Laterality: N/A;    Assessment & Plan Clinical Impression: Patient is a 71 y.o. year old male right handed male with history of obesity, hypertension, hyperlipidemia, PAF and poor anticoagulation candidate due to alcohol abuse and history of syncope, subclavian artery stenosis status post stenting. Patient lives with son and grandchildren. Independent prior to admission. Limited assistance of family. One level home with basement. Presented 08/19/2015 with episodes of dizziness and presyncope, chest discomfort and increasing dyspnea with exertion. Recent cardiac stress test positive for ischemia and subsequent cardiac catheterization demonstrated severe multivessel coronary artery disease. Echocardiogram with ejection fraction of 45% diffuse hypokinesis. Underwent CABG 4 08/20/2015 per [name] trigt. Hospital course acute blood loss anemia 8.0-8.3 and monitored. Currently maintained on aspirin 81 mg daily after CABG. Subcutaneous Lovenox for DVT prophylaxis. Developed atrial fibrillation 08/22/2015 maintained on intravenous amiodarone and transitioned to Lopressor secondary to nausea from amiodarone. 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 A nasogastric tube initially placed. Underwent exploratory laparotomy right hemicolectomy with anastomosis 08/25/2015 per [name]. Nasogastric tube is been removed and diet advanced to regular consistency. Physical therapy evaluation completed 08/22/2015 with strict sternal precautions  Patient transferred to CIR on 09/09/2015 .    Patient currently requires min with basic self-care skills and functional mobility secondary to muscle weakness, decreased cardiorespiratoy endurance and adherence to sternal precautions and decreased sitting balance, decreased  standing balance and difficulty maintaining precautions.  Prior to hospitalization, patient could complete ADL with independent .  Patient will benefit from skilled intervention to decrease level of assist with basic self-care skills and increase independence with basic self-care skills prior to discharge home with care partner.  Anticipate patient will require intermittent supervision and no further OT follow recommended.  OT - End of Session Activity Tolerance: Tolerates 30+ min activity with multiple rests Endurance Deficit: Yes OT Assessment Rehab Potential (ACUTE ONLY): Good OT Patient demonstrates impairments in the following area(s): Balance;Edema;Endurance;Motor;Pain;Skin Integrity;Safety OT Basic ADL's Functional Problem(s): Grooming;Bathing;Dressing;Toileting OT Advanced ADL's Functional Problem(s): Simple Meal Preparation OT Transfers Functional Problem(s): Toilet;Tub/Shower OT Additional Impairment(s): None OT Plan OT Intensity: Minimum of 1-2 x/day, 45 to 90 minutes OT Frequency: 5 out of 7 days OT Duration/Estimated Length of Stay: ~ 5 days OT Treatment/Interventions: Balance/vestibular training;Community reintegration;Discharge planning;Disease mangement/prevention;Pain management;UE/LE Coordination activities;Skin care/wound managment;Functional mobility training;Self Care/advanced ADL retraining;UE/LE Strength taining/ROM;Therapeutic Exercise;Functional electrical stimulation;Psychosocial support;DME/adaptive equipment instruction;Therapeutic Activities;Patient/family education OT Self Feeding Anticipated Outcome(s): n/a OT Basic Self-Care Anticipated Outcome(s): mod I  OT Toileting Anticipated Outcome(s): mod I  OT Bathroom Transfers Anticipated Outcome(s): mod I  OT Recommendation Recommendations for Other Services: Neuropsych consult Patient destination: Home Follow Up Recommendations: None Equipment Recommended: To be determined   Skilled Therapeutic  Intervention 1:1 OT eval initiated with Ot goals, purpose, and role discussed. Self care retraining at shower level with focus on sit to stands without UE support, standing balance, activity tolerance and functional ambulation with a Rollator. Pt required min to mod cues for sternal  precautions with functional tasks. No clothes on eval. Pt perform simulated tub bench transfer (like his set up at home) with supervision with extra time. Pt able to ambulate around unit with min A without AE and supervision with Rollator with increased speed.   OT Evaluation Precautions/Restrictions  Precautions Precautions: Fall;Sternal Restrictions Weight Bearing Restrictions: No General Chart Reviewed: Yes Family/Caregiver Present: No Vital Signs  Pain Pain Assessment Pain Assessment: No/denies pain Home Living/Prior Functioning Home Living Family/patient expects to be discharged to:: Private residence Living Arrangements: Children Available Help at Discharge: Family, Available 24 hours/day Type of Home: House Home Access: Stairs to enter [employer] of Steps: 1 at back of house Entrance Stairs-Rails: None Home Layout: Laundry or work area in basement, Able to live on main level with bedroom/bathroom Bathroom Shower/Tub: Chiropodist: Standard  Lives With: Family ADL   Vision/Perception  Vision- History Baseline Vision/History: Wears glasses Wears Glasses: At all times Patient Visual Report: No change from baseline Vision- Assessment Vision Assessment?: No apparent visual deficits  Cognition Overall Cognitive Status: Within Functional Limits for tasks assessed Arousal/Alertness: Awake/alert Orientation Level: Person;Place;Situation Person: Oriented Place: Oriented Situation: Oriented Year: 2017 Month: May Day of Week: Correct Memory: Appears intact Immediate Memory Recall: Sock;Blue;Bed Memory Recall: Sock;Blue;Bed Attention: Alternating Awareness:  Appears intact Problem Solving: Appears intact Safety/Judgment: Appears intact Sensation Sensation Light Touch: Appears Intact Stereognosis: Appears Intact Hot/Cold: Appears Intact Proprioception: Appears Intact Coordination Gross Motor Movements are Fluid and Coordinated: No (slow and swollen LEs) Fine Motor Movements are Fluid and Coordinated: Yes Motor  Motor Motor - Skilled Clinical Observations: generalized weakness and LEs edema Mobility  Transfers Transfers: Sit to Stand;Stand to Sit Sit to Stand: 4: Min guard Stand to Sit: 4: Min guard  Trunk/Postural Assessment  Cervical Assessment Cervical Assessment: Within Functional Limits Thoracic Assessment Thoracic Assessment:  (flexed forward) Lumbar Assessment Lumbar Assessment: Within Functional Limits Postural Control Postural Control: Within Functional Limits  Balance Balance Balance Assessed: Yes Dynamic Sitting Balance Dynamic Sitting - Balance Support: During functional activity Dynamic Sitting - Level of Assistance: 5: Stand by assistance Static Standing Balance Static Standing - Balance Support: During functional activity;No upper extremity supported Static Standing - Level of Assistance: 4: Min assist Dynamic Standing Balance Dynamic Standing - Balance Support: During functional activity;No upper extremity supported Dynamic Standing - Level of Assistance: 4: Min assist Extremity/Trunk Assessment RUE Assessment RUE Assessment: Within Functional Limits LUE Assessment LUE Assessment: Within Functional Limits   See Function Navigator for Current Functional Status.   Refer to Care Plan for Long Term Goals  Recommendations for other services: Neuropsych  Discharge Criteria: Patient will be discharged from OT if patient refuses treatment 3 consecutive times without medical reason, if treatment goals not met, if there is a change in medical status, if patient makes no progress towards goals or if patient is  discharged from hospital.  The above assessment, treatment plan, treatment alternatives and goals were discussed and mutually agreed upon: by patient  Nicoletta Ba 09/10/2015, 8:54 AM

## 2015-09-10 NOTE — Evaluation (Signed)
Physical Therapy Assessment and Plan  Patient Details  Name: Victor Diaz MRN: [id number] Date of Birth: 07-27-1945  PT Diagnosis: Difficulty walking, Edema, Muscle weakness and Osteoarthritis Rehab Potential: Good ELOS: 5-7 days    Today's Date: 09/10/2015 PT Individual Time:  -  1100-1200 Individual treatment time: 60 min       Problem List:  Patient Active Problem List   Diagnosis Date Noted  . Debilitated 09/09/2015  . Pressure ulcer 09/07/2015  . Acute blood loss anemia   . Acute systolic congestive heart failure ([place])   . Coronary artery disease involving native coronary artery of native heart without angina pectoris   . Essential hypertension   . Obesity   . Paroxysmal atrial fibrillation (HCC)   . Subclavian artery stenosis ([place])   . Tachycardia   . Tachypnea   . 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 Unstable angina ([place]) 08/19/2015  . Abnormal myocardial perfusion study   . Hyperlipidemia 01/30/2015  . Faintness   . 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 Thrombocytopenia ([place]) 10/16/2014  . Atrial flutter ([place]) 10/16/2014  . Elevated LFTs 10/16/2014  . Subclavian steal syndrome 10/16/2014  . Subclavian artery stenosis, left 10/16/2014  . Diastolic dysfunction 83/38/2505  . Carotid stenosis 10/16/2014  . Bruit of left carotid artery   . Dehydration   . Syncope 10/14/2014  . ETOH abuse 10/14/2014  . Hyponatremia 10/14/2014  . Metabolic acidosis 39/76/7341  . Chest pain 10/14/2014  . Laceration of head 10/14/2014  . Malnutrition ([place]) 10/14/2014  . Forehead laceration   . Adjustment disorder with mixed anxiety and depressed mood 03/17/2013  . Aortic stenosis 08/03/2011  . Cerebrovascular disease 08/03/2011  . ASCVD (arteriosclerotic cardiovascular disease) 08/03/2011  . Bladder neck obstruction 08/18/2010  . PLEURAL EFFUSION, RIGHT 04/07/2010  . GERD 05/14/2009  . HLD (hyperlipidemia) 12/11/2006  . GOUT 12/11/2006  . DEPENDENCE, ALCOHOL NEC/NOS,  UNSPECIFIED 12/11/2006  . ALLERGIC RHINITIS 12/11/2006  . LIVER FUNCTION TESTS, ABNORMAL 12/11/2006    Past Medical History:  Past Medical History  Diagnosis Date  . Allergy   . Gout   . Hyperlipidemia   . Alcohol abuse   . Hemothorax on right   . Lisfranc's dislocation 10/20/2011  . Right fibular fracture 10/20/2011  . Essential hypertension   . PSVT (paroxysmal supraventricular tachycardia) ([place])   . Paroxysmal atrial flutter (HCC)     Poor anticoagulation candidate with active alcohol abuse and syncope  . Subclavian artery stenosis, left     Status post stent placement by Dr. Trula Slade 10/2014   Past Surgical History:  Past Surgical History  Procedure Laterality Date  . Appendectomy    . Hernia repair    . Tonsilectomy, adenoidectomy, bilateral myringotomy and tubes    . Revision total hip arthroplasty  08/28/2008  . Partial hip arthroplasty Left 2010  . Peripheral vascular catheterization N/A 11/11/2014    Procedure: Aortic Arch Angiography/Left Subclavion Stent;  Surgeon: Serafina Mitchell, MD;  Location: [facility];  Service: Cardiovascular;  Laterality: N/A;  . Cardiac catheterization N/A 08/19/2015    Procedure: Left Heart Cath and Cors/Grafts Angiography;  Surgeon: Jettie Booze, MD;  Location: [facility];  Service: Cardiovascular;  Laterality: N/A;  . Coronary artery bypass graft N/A 08/20/2015    Procedure: CORONARY ARTERY BYPASS GRAFTING (CABG) TIMES FOUR  UTILIZING THE LEFT INTERNAL MAMMARY ARTERY AND ENDOSCOPICALLY HARVESTED RIGHT SAPHENEOUS VEINS. CLIPPING OF ATRIAL APPENDAGE;  Surgeon: Ivin Poot, MD;  Location: [facility]  OR;  Service: Open Heart Surgery;  Laterality: N/A;  . Tee without cardioversion N/A 08/20/2015    Procedure: TRANSESOPHAGEAL ECHOCARDIOGRAM (TEE);  Surgeon: Ivin Poot, MD;  Location: [facility];  Service: Open Heart Surgery;  Laterality: N/A;  . Laparotomy N/A 08/25/2015    Procedure: EXPLORATORY LAPAROTOMY;  Surgeon: Ralene Ok, MD;   Location: [facility];  Service: General;  Laterality: N/A;  . Partial colectomy N/A 08/25/2015    Procedure: ASCENDING AND TRANSVERSE COLECTOMY;  Surgeon: Ralene Ok, MD;  Location: [facility];  Service: General;  Laterality: N/A;    Assessment & Plan Clinical Impression: Patient is a 70 y.o. right handed male with history of obesity, hypertension, hyperlipidemia, PAF and poor anticoagulation candidate due to alcohol abuse and history of syncope, subclavian artery stenosis status post stenting. Patient lives with son and grandchildren. Independent prior to admission. Limited assistance of family. One level home with basement. Presented 08/19/2015 with episodes of dizziness and presyncope, chest discomfort and increasing dyspnea with exertion. Recent cardiac stress test positive for ischemia and subsequent cardiac catheterization demonstrated severe multivessel coronary artery disease. Echocardiogram with ejection fraction of 45% diffuse hypokinesis. Underwent CABG 4 08/20/2015 per [name] trigt. Hospital course acute blood loss anemia 8.0-8.3 and monitored. Currently maintained on aspirin 81 mg daily after CABG. Subcutaneous Lovenox for DVT prophylaxis. Developed atrial fibrillation 08/22/2015 maintained on intravenous amiodarone and transitioned to Lopressor secondary to nausea from amiodarone. 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 A nasogastric tube initially placed. Underwent exploratory laparotomy right hemicolectomy with anastomosis 08/25/2015 per [name]. Nasogastric tube is been removed and diet advanced to regular consistency. Patient transferred to CIR on 09/09/2015 .   Patient currently requires min with mobility secondary to muscle weakness, decreased cardiorespiratoy endurance and decreased standing balance and difficulty maintaining precautions.  Prior to hospitalization, patient was modified independent  with mobility and lived with Family in a House  home.  Home access is 1 at back of houseStairs to enter.  Patient will benefit from skilled PT intervention to maximize safe functional mobility, minimize fall risk and decrease caregiver burden for planned discharge home with intermittent assist.  Anticipate patient will benefit from follow up [facility] at discharge.  PT - End of Session Activity Tolerance: Tolerates 30+ min activity with multiple rests Endurance Deficit: Yes PT Assessment Rehab Potential (ACUTE/IP ONLY): Good Barriers to Discharge: Decreased caregiver support PT Patient demonstrates impairments in the following area(s): Balance;Edema;Pain;Perception;Safety PT Transfers Functional Problem(s): Bed to Chair;Car;Furniture;Floor PT Locomotion Functional Problem(s): Ambulation;Stairs PT Plan PT Intensity: Minimum of 1-2 x/day ,45 to 90 minutes PT Frequency: 5 out of 7 days PT Duration Estimated Length of Stay: 5-7 days  PT Treatment/Interventions: Ambulation/gait training;Balance/vestibular training;Cognitive remediation/compensation;Discharge planning;Disease management/prevention;DME/adaptive equipment instruction;Community reintegration;Functional mobility training;Neuromuscular re-education;Pain management;Patient/family education;Psychosocial support;Skin care/wound management;Stair training;Therapeutic Activities;Therapeutic Exercise;UE/LE Strength taining/ROM;UE/LE Coordination activities;Wheelchair propulsion/positioning PT Transfers Anticipated Outcome(s): Mod I with LRAD PT Locomotion Anticipated Outcome(s): Mod I with LRAD PT Recommendation Recommendations for Other Services: Neuropsych consult Follow Up Recommendations: Home health PT;Outpatient PT Patient destination: Home Equipment Recommended: Other (comment) (Rollator)  Skilled Therapeutic Intervention PT instructed patient in Evaluation and initiated treatment intervention. See below for results. PT also instructed patient in car transfer with rollator and min A for  increased safety and proper AD management. Gait with rollator for 117f with supervision A from PT with min cues for improved step length and decreased UE support on AD. Gait on uneven surface for 10 ft without AD with min  A from PT.  Patient instructed in picking object up from floor with min A from PT and cues for increased width of BOS and proper breathing to prevent valsalva.   Patient left sitting in recliner at end of PT session with call bell within reach.   PT Evaluation Precautions/Restrictions Precautions Precautions: Fall;Sternal Restrictions Weight Bearing Restrictions: No General   Vital SignsTherapy Vitals Temp: 97.8 F (36.6 C) Temp Source: Oral Pulse Rate: 83 Resp: 18 BP: (!) 123/57 mmHg Patient Position (if appropriate): Sitting Oxygen Therapy SpO2: 99 % O2 Device: Not Delivered Pain   Home Living/Prior Functioning Home Living Available Help at Discharge: Family;Available 24 hours/day Type of Home: House Home Access: Stairs to enter [employer] of Steps: 1 at back of house Entrance Stairs-Rails: None Home Layout: Laundry or work area in basement;Able to live on main level with bedroom/bathroom;One level Bathroom Shower/Tub: Optometrist: Yes  Lives With: Family Prior Function Level of Independence: Independent with basic ADLs;Independent with homemaking with ambulation  Able to Take Stairs?: No Driving: No 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  Vision/Perception     Cognition Overall Cognitive Status: Within Functional Limits for tasks assessed Arousal/Alertness: Awake/alert Attention: Alternating Memory: Appears intact Awareness: Appears intact Problem Solving: Appears intact Safety/Judgment: Appears intact Sensation Sensation Light Touch: Appears Intact Stereognosis: Appears Intact Hot/Cold: Appears Intact Proprioception: Appears  Intact Coordination Gross Motor Movements are Fluid and Coordinated: No (slow and swollen LEs) Fine Motor Movements are Fluid and Coordinated: Yes Motor  Motor Motor: Within Functional Limits Motor - Skilled Clinical Observations: generalized weakness and LEs edema  Mobility Bed Mobility Bed Mobility: Rolling Right;Rolling Left;Supine to Sit;Sit to Supine Rolling Right: 6: Modified independent (Device/Increase time);5: Supervision Rolling Left: 6: Modified independent (Device/Increase time);5: Supervision Supine to Sit: 5: Supervision Supine to Sit Details: Verbal cues for technique;Verbal cues for precautions/safety Sit to Supine: 5: Supervision Sit to Supine - Details: Verbal cues for sequencing;Verbal cues for technique;Verbal cues for precautions/safety Transfers Transfers: Yes Sit to Stand: 4: Min guard Stand to Sit: 4: Min guard Locomotion  Ambulation Ambulation: Yes Ambulation/Gait Assistance: 4: Min guard Assistive device: None Gait Gait: Yes Gait Pattern: Impaired Gait Pattern: Decreased step length - right;Decreased step length - left;Shuffle Stairs / Additional Locomotion Stairs: Yes Stairs Assistance: 4: Min assist Stairs Assistance Details: Verbal cues for precautions/safety Stair Management Technique: Two rails Number of Stairs: 4 Height of Stairs: 6 Curb: 4: Min Administrator Mobility: No  Trunk/Postural Assessment  Cervical Assessment Cervical Assessment: Within Functional Limits Thoracic Assessment Thoracic Assessment:  (flexed forward) Lumbar Assessment Lumbar Assessment: Within Functional Limits Postural Control Postural Control: Within Functional Limits  Balance Balance Balance Assessed: Yes Dynamic Sitting Balance Dynamic Sitting - Balance Support: During functional activity Dynamic Sitting - Level of Assistance: 5: Stand by assistance Static Standing Balance Static Standing - Balance Support: During functional  activity;No upper extremity supported Static Standing - Level of Assistance: 4: Min assist Dynamic Standing Balance Dynamic Standing - Balance Support: During functional activity;No upper extremity supported Dynamic Standing - Level of Assistance: 4: Min assist Extremity Assessment      RLE Assessment RLE Assessment: Within Functional Limits (grossly 4+/5 to 5/5 throughout LE) LLE Assessment LLE Assessment: Within Functional Limits (grossly 4+/5 to 5/5 throughout LE)   See Function Navigator for Current Functional Status.   Refer to Care Plan for Long Term Goals  Recommendations for other services: Neuropsych  Discharge Criteria: Patient will be discharged from  PT if patient refuses treatment 3 consecutive times without medical reason, if treatment goals not met, if there is a change in medical status, if patient makes no progress towards goals or if patient is discharged from hospital.  The above assessment, treatment plan, treatment alternatives and goals were discussed and mutually agreed upon: by patient  Lorie Phenix 09/10/2015, 6:21 PM

## 2015-09-10 NOTE — Progress Notes (Signed)
Social Work  Social Work Assessment and Plan  Patient Details  Name: Victor Diaz MRN: [id number] Date of Birth: 10/16/1945  Today's Date: 09/10/2015  Problem List:  Patient Active Problem List   Diagnosis Date Noted  . Debilitated 09/09/2015  . Pressure ulcer 09/07/2015  . Acute blood loss anemia   . Acute systolic congestive heart failure ([place])   . Coronary artery disease involving native coronary artery of native heart without angina pectoris   . Essential hypertension   . Obesity   . Paroxysmal atrial fibrillation (HCC)   . Subclavian artery stenosis ([place])   . Tachycardia   . Tachypnea   . 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 Unstable angina ([place]) 08/19/2015  . Abnormal myocardial perfusion study   . Hyperlipidemia 01/30/2015  . Faintness   . 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 Thrombocytopenia ([place]) 10/16/2014  . Atrial flutter ([place]) 10/16/2014  . Elevated LFTs 10/16/2014  . Subclavian steal syndrome 10/16/2014  . Subclavian artery stenosis, left 10/16/2014  . Diastolic dysfunction [id number]  . Carotid stenosis 10/16/2014  . Bruit of left carotid artery   . Dehydration   . Syncope 10/14/2014  . ETOH abuse 10/14/2014  . Hyponatremia 10/14/2014  . Metabolic acidosis [id number]  . Chest pain 10/14/2014  . Laceration of head 10/14/2014  . Malnutrition ([place]) 10/14/2014  . Forehead laceration   . Adjustment disorder with mixed anxiety and depressed mood 03/17/2013  . Aortic stenosis 08/03/2011  . Cerebrovascular disease 08/03/2011  . ASCVD (arteriosclerotic cardiovascular disease) 08/03/2011  . Bladder neck obstruction 08/18/2010  . PLEURAL EFFUSION, RIGHT 04/07/2010  . GERD 05/14/2009  . HLD (hyperlipidemia) 12/11/2006  . GOUT 12/11/2006  . DEPENDENCE, ALCOHOL NEC/NOS, UNSPECIFIED 12/11/2006  . ALLERGIC RHINITIS 12/11/2006  . LIVER FUNCTION TESTS, ABNORMAL 12/11/2006   Past Medical History:  Past Medical History  Diagnosis Date  . Allergy   .  Gout   . Hyperlipidemia   . Alcohol abuse   . Hemothorax on right   . Lisfranc's dislocation 10/20/2011  . Right fibular fracture 10/20/2011  . Essential hypertension   . PSVT (paroxysmal supraventricular tachycardia) ([place])   . Paroxysmal atrial flutter (HCC)     Poor anticoagulation candidate with active alcohol abuse and syncope  . Subclavian artery stenosis, left     Status post stent placement by Dr. Trula Slade 10/2014   Past Surgical History:  Past Surgical History  Procedure Laterality Date  . Appendectomy    . Hernia repair    . Tonsilectomy, adenoidectomy, bilateral myringotomy and tubes    . Revision total hip arthroplasty  08/28/2008  . Partial hip arthroplasty Left 2010  . Peripheral vascular catheterization N/A 11/11/2014    Procedure: Aortic Arch Angiography/Left Subclavion Stent;  Surgeon: Serafina Mitchell, MD;  Location: [facility];  Service: Cardiovascular;  Laterality: N/A;  . Cardiac catheterization N/A 08/19/2015    Procedure: Left Heart Cath and Cors/Grafts Angiography;  Surgeon: Jettie Booze, MD;  Location: [facility];  Service: Cardiovascular;  Laterality: N/A;  . Coronary artery bypass graft N/A 08/20/2015    Procedure: CORONARY ARTERY BYPASS GRAFTING (CABG) TIMES FOUR  UTILIZING THE LEFT INTERNAL MAMMARY ARTERY AND ENDOSCOPICALLY HARVESTED RIGHT SAPHENEOUS VEINS. CLIPPING OF ATRIAL APPENDAGE;  Surgeon: Ivin Poot, MD;  Location: [facility];  Service: Open Heart Surgery;  Laterality: N/A;  . Tee without cardioversion N/A 08/20/2015    Procedure: TRANSESOPHAGEAL ECHOCARDIOGRAM (TEE);  Surgeon: Ivin Poot, MD;  Location: [facility];  Service: Open Heart Surgery;  Laterality: N/A;  . Laparotomy N/A 08/25/2015    Procedure: EXPLORATORY LAPAROTOMY;  Surgeon: Ralene Ok, MD;  Location: [facility];  Service: General;  Laterality: N/A;  . Partial colectomy N/A 08/25/2015    Procedure: ASCENDING AND TRANSVERSE COLECTOMY;  Surgeon: Ralene Ok, MD;  Location: [facility];  Service: General;  Laterality: N/A;   Social History:  reports that he quit smoking about 46 years ago. His smoking use included Cigarettes. He has a 10 pack-year smoking history. He has never used smokeless tobacco. He reports that he drinks about 37.8 oz of alcohol per week. He reports that he does not use illicit drugs.  Family / Support Systems Marital Status: Widow/Widower Patient Roles: Parent, Building control surveyor (Cares for great gandchildren while grandaughter works) Children: Chris-son [id number] Other Supports: Another son and grandaughter Anticipated Caregiver: Patient and family can assist with home management Ability/Limitations of Caregiver: Pt needs to be mod/i no one to provide care to him. Caregiver Availability: Intermittent Family Dynamics: Pt is close with his tow son's and granddaughter, all were living toegether until one son moved out. Pt was providing care to his great grandchildren but will not be able to do this at discharge. He is too kind and not able to set limits with his children. He will need to now he has had surgery and needs to recover himself.  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 He tells this worker the therapists feel he is doing well, he is unsure of this. He is aware he will not be able to care for his young great grandchildren when he goes home from here. He is trying to stay upbeat and  take it as it comes. Recent Psychosocial Issues: other health issues was managing them with PCP help. His multiple syncope episodes limted him greatly Pyschiatric History: No history but feel he is at high risk for depression-will have neuro-psych see Monday for coping and substance abuse issues. He doesn't feel depressed but acknowledges low self esteem and wonder if still grieivng his wife's passing.  Substance Abuse History: Quit tobacco but admits to ETOH-beer. Aware needs to quit drinking and is willing too for his health. Aware of the services available to him and will continue to discuss what he feels he will follow up with.  Patient / Family Perceptions, Expectations & Goals Pt/Family understanding of illness & functional limitations: Pt is able to explain his surgery's and treatment plan. He does talk with his MD's and feels his questions and concerns are being addressed. He is glad he is feeling better from his ileus. Premorbid pt/family roles/activities: Father, grandfather, retiree, caregiver, church member, etc Anticipated changes in roles/activities/participation: resume Pt/family expectations/goals: Pt states: " I need to be able to take care of myself, I know I can't take care of my great grandchildren when I go home."  Son states: " I hope he does well but we will help him."  [employer]: None Premorbid Home Care/DME Agencies: Other (Comment) (Had in past) Transportation available at discharge:  family Resource referrals recommended: Neuropsychology, Support group (specify)  Discharge Planning Living Arrangements: Children Support Systems: Children, Other relatives, Friends/neighbors, Church/faith community Type of Residence: Private residence Insurance Resources: [employer], Multimedia programmer (specify) Nurse, mental health) Financial Resources: Radio broadcast assistant Screen Referred: No Living Expenses: Lives with family Money Management: Patient Does the  patient have any problems obtaining your medications?: No Home Management: patient and family members Patient/Family Preliminary Plans: Return home with son and granddaughter, pt needs to be self sufficent and able to take care of himself, he will not be able to care for great grandchildren and have informed son of this. Since pt has difficulty saying no to grandaughter. He has the option to go to his other son's who lives on a second floor apartment. So will see how weell he does here. Social Work Anticipated Follow Up Needs: HH/OP, Support Group  Clinical Impression Pleasant gentleman who is motivated to improve and feel better, prior to admission he was having several syncopal episodes which severely limited his activity. He is hopeful now he will be able to be more active and work on his health issues and drinking issues. He is honest and open regarding his concerns and how he feels. He should be a short length of stay due to how well he is doing. Will have neuro-psych see on Monday and work on discharge plans.  Elease Hashimoto 09/10/2015, 11:38 AM

## 2015-09-10 NOTE — Care Management Note (Signed)
[facility] Individual Statement of Services  Patient Name:  Victor Diaz  Date:  09/10/2015  Welcome to the [facility].  Our goal is to provide you with an individualized program based on your diagnosis and situation, designed to meet your specific needs.  With this comprehensive rehabilitation program, you will be expected to participate in at least 3 hours of rehabilitation therapies Monday-Friday, with modified therapy programming on the weekends.  Your rehabilitation program will include the following services:  Physical Therapy (PT), Occupational Therapy (OT), Speech Therapy (ST), 24 hour per day rehabilitation nursing, Neuropsychology, Case Management (Social Worker), Rehabilitation Medicine, Nutrition Services and Pharmacy Services  Weekly team conferences will be held on Wednesday to discuss your progress.  Your Social Worker will talk with you frequently to get your input and to update you on team discussions.  Team conferences with you and your family in attendance may also be held.  Expected length of stay: 5-7 days  Overall anticipated outcome: mod/i level  Depending on your progress and recovery, your program may change. Your Social Worker will coordinate services and will keep you informed of any changes. Your Social Worker's name and contact numbers are listed  below.  The following services may also be recommended but are not provided by the [facility]:    [facility] [place] will be made to provide these services after discharge if needed.  Arrangements include referral to agencies that provide these services.  Your insurance has been verified to be:  [place] Your primary doctor is:  Film/video editor  Pertinent information will be shared with your doctor and your insurance company.  Social Worker:  Ovidio Kin, [place] or  ([phone]  Information discussed with and copy given to patient by: Elease Hashimoto, 09/10/2015, 10:20 AM

## 2015-09-11 ENCOUNTER — Inpatient Hospital Stay (HOSPITAL_COMMUNITY): Payer: Medicare Other | Admitting: *Deleted

## 2015-09-11 ENCOUNTER — Inpatient Hospital Stay (HOSPITAL_COMMUNITY): Payer: Medicare Other | Admitting: Physical Therapy

## 2015-09-11 ENCOUNTER — Inpatient Hospital Stay (HOSPITAL_COMMUNITY): Payer: Medicare Other | Admitting: Occupational Therapy

## 2015-09-11 NOTE — Anesthesia Postprocedure Evaluation (Signed)
Anesthesia Post Note  Patient: ABDUR NITCHER  Procedure(s) Performed: Procedure(s) (LRB): EXPLORATORY LAPAROTOMY (N/A) ASCENDING AND TRANSVERSE COLECTOMY (N/A)  Patient location during evaluation: SICU Anesthesia Type: General Level of consciousness: sedated and patient remains intubated per anesthesia plan Pain management: pain level controlled Vital Signs Assessment: post-procedure vital signs reviewed and stable Respiratory status: patient on ventilator - see flowsheet for VS and patient remains intubated per anesthesia plan (Pt failed extubation trial, required re-intubation ) Cardiovascular status: blood pressure returned to baseline and stable Anesthetic complications: no Comments: Delayed entry: pt eval in SICU He was extubated POD 1    Last Vitals:  Filed Vitals:   09/09/15 0405 09/09/15 1309  BP: 149/73 105/72  Pulse: 88 86  Temp: 36.7 C 36.8 C  Resp: 17 18    Last Pain:  Filed Vitals:   09/09/15 1310  PainSc: 0-No pain                 Abbie Berling,E. Danielle Mink

## 2015-09-11 NOTE — Progress Notes (Addendum)
[facility]IC[facility] & RE[facility]ILITATION     PROGRESS NOTE  Subjective/Complaints:  Several BMs some loose yesterday , no abd pain  ROS: Denies CP, SOB, nausea, vomiting, diarrhea.  Objective: Vital Signs: Blood pressure 144/65, pulse 81, temperature 97.8 F (36.6 C), temperature source Oral, resp. rate 18, height 5\' 6"  (1.676 m), weight 89.903 kg (198 lb 3.2 oz), SpO2 99 %. No results found.  Recent Labs  09/09/15 1708 09/10/15 0645  WBC 5.3 5.4  HGB 9.3* 9.4*  HCT 29.2* 29.2*  PLT 254 264    Recent Labs  09/09/15 1708 09/10/15 0645  NA  --  129*  K  --  4.3  CL  --  97*  GLUCOSE  --  108*  BUN  --  15  CREATININE 1.12 1.10  CALCIUM  --  8.4*   CBG (last 3)   Recent Labs  09/08/15 0614  GLUCAP 103*    Wt Readings from Last 3 Encounters:  09/10/15 89.903 kg (198 lb 3.2 oz)  09/09/15 99.7 kg (219 lb 12.8 oz)  08/13/15 92.987 kg (205 lb)    Physical Exam:  BP 144/65 mmHg  Pulse 81  Temp(Src) 97.8 F (36.6 C) (Oral)  Resp 18  Ht 5\' 6"  (1.676 m)  Wt 89.903 kg (198 lb 3.2 oz)  BMI 32.01 kg/m2  SpO2 99% Constitutional: He appears well-developed and well-nourished. NAD. HENT: Normocephalic and atraumatic.  Eyes: Conjunctivae and EOM are normal.  Neck: Normal range of motion. Neck supple. No thyromegaly present.  Cardiovascular: Irregularly irregular  Respiratory: Effort normal and breath sounds normal. No respiratory distress.  GI: Soft. Bowel sounds are normal. He exhibits no distension. Obese  Musculoskeletal: He exhibits edema. He exhibits no tenderness. Neurological: He is alert and oriented.  Follows simple commands Motor: 4-4+/5 bilateral deltoids, biceps, triceps, grip Bilateral lower extremities: 4/5 in the hip flexors and knee extensors, 4+ ankle dorsiflexors and plantar flexors Skin: Skin is warm and dry. Incision c/d/i.  Midline chest incision clean and dry  Psychiatric: He has a normal mood and affect. His behavior is  normal   Assessment/Plan: 1. Functional deficits secondary to deconditioning which require 3+ hours per day of interdisciplinary therapy in a comprehensive inpatient rehab setting. Physiatrist is providing close team supervision and 24 hour management of active medical problems listed below. Physiatrist and rehab team continue to assess barriers to discharge/monitor patient progress toward functional and medical goals.  Function:  Bathing Bathing position   Position: Shower  Bathing parts Body parts bathed by patient: Right arm, Left arm, Chest, Abdomen, Front perineal area, Buttocks, Right upper leg, Left upper leg Body parts bathed by helper: Right lower leg, Left lower leg, Back  Bathing assist Assist Level: Touching or steadying assistance(Pt > 75%)      Upper Body Dressing/Undressing Upper body dressing   What is the patient wearing?: Pull over shirt/dress     Pull over shirt/dress - Perfomed by patient: Thread/unthread right sleeve, Thread/unthread left sleeve, Put head through opening, Pull shirt over trunk          Upper body assist Assist Level: Set up      Lower Body Dressing/Undressing Lower body dressing   What is the patient wearing?: Underwear, Pants, Socks, Shoes Underwear - Performed by patient: Thread/unthread right underwear leg, Thread/unthread left underwear leg, Pull underwear up/down   Pants- Performed by patient: Thread/unthread right pants leg, Thread/unthread left pants leg, Pull pants up/down       Socks -  Performed by patient: Don/doff right sock, Don/doff left sock Socks - Performed by helper: Don/doff right sock, Don/doff left sock   Shoes - Performed by helper: Don/doff right shoe, Don/doff left shoe, Fasten right, Fasten left          Lower body assist Assist for lower body dressing: Touching or steadying assistance (Pt > 75%)      Toileting Toileting   Toileting steps completed by patient: Adjust clothing prior to toileting,  Performs perineal hygiene, Adjust clothing after toileting   Toileting Assistive Devices: Grab bar or rail  Toileting assist Assist level: Touching or steadying assistance (Pt.75%)   Transfers Chair/bed transfer   Chair/bed transfer method: Stand pivot Chair/bed transfer assist level: Touching or steadying assistance (Pt > 75%) Chair/bed transfer assistive device: Armrests, Medical sales representative     Max distance: 135ft Assist level: Touching or steadying assistance (Pt > 75%)   Wheelchair Wheelchair activity did not occur: N/A        Cognition Comprehension Comprehension assist level: Follows basic conversation/direction with no assist  Expression Expression assist level: Expresses complex ideas: With extra time/assistive device  Social Interaction Social Interaction assist level: Interacts appropriately with others with medication or extra time (anti-anxiety, antidepressant).  Problem Solving Problem solving assist level: Solves basic problems with no assist  Memory Memory assist level: Recognizes or recalls 90% of the time/requires cueing < 10% of the time    Medical Problem List and Plan: 1. 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  CIR 2. DVT Prophylaxis/Anticoagulation: Subcutaneous Lovenox. Monitor platelet counts and any signs of bleeding 3. Pain Management: Ultram as needed 4. Mood: Ativan 0.5 mg daily at bedtime 5. Neuropsych: This patient is capable of making decisions on his own behalf. 6. Skin/Wound Care: Routine skin checks 7. Fluids/Electrolytes/Nutrition: Routine I&O's, eating 100% meals 8. Acute blood loss anemia.   Hb 9.4 on 5/26, stable   Will cont to monitor  Labs ordered for Monday 9. Hypertension/atrial fibrillation.  Amiodarone 200 mg twice a day, lisinopril 2.5 mg daily, Lasix 40 mg twice a day, Toprol 15 mg daily. Cardiac rate controlled. Monitor with increased mobility Filed Vitals:   09/10/15 1535 09/10/15 1958  BP: 123/57 144/65  Pulse: 83 81  Temp: 97.8 F (36.6 C)   Resp: 18    10. History of syncope with subclavian artery stenosis status post stenting. Check orthostatic blood pressures 11. History of alcohol abuse. Counseling 12. Hyperlipidemia. No Zocor while on amiodarone. Patient has allergy to Lipitor 13. Hyponatremia: asymptomatic  Na 129 on 5/26, appears to be stable  Will cont to monitor  Labs ordered for Monday  LOS (Days) 2 A FACE TO FACE EVALUATION WAS PERFORMED  Charlett Blake 09/11/2015 6:05 AM

## 2015-09-11 NOTE — IPOC Note (Deleted)
Overall Plan of Care [facility]) Patient Details Name: Victor Diaz MRN: [id number] DOB: Feb 26, 1946  Admitting Diagnosis: [facility] Problems: Active Problems:   Debilitated     Functional Problem List: Nursing Bladder, Edema, Endurance, Motor, Safety, Skin Integrity  PT Balance, Edema, Pain, Perception, Safety  OT Balance, Edema, Endurance, Motor, Pain, Skin Integrity, Safety  SLP    TR         Basic ADL's: OT Grooming, Bathing, Dressing, Toileting     Advanced  ADL's: OT Simple Meal Preparation     Transfers: PT Bed to Chair, Car, Furniture, Floor  OT Toilet, Tub/Shower     Locomotion: PT Ambulation, Stairs     Additional Impairments: OT None  SLP        TR      Anticipated Outcomes Item Anticipated Outcome  Self Feeding n/a  Swallowing      Basic self-care  mod I   Toileting  mod I    Bathroom Transfers mod I   Bowel/Bladder  Continent to bowel and bladder with m,in. assist.  Transfers  Mod I with LRAD  Locomotion  Mod I with LRAD  Communication     Cognition     Pain  Less than 81m,on 1 to 10 scale.  Safety/Judgment  Free from, falls during his stay in rehab.   Therapy Plan: PT Intensity: Minimum of 1-2 x/day ,45 to 90 minutes PT Frequency: 5 out of 7 days PT Duration Estimated Length of Stay: 5-7 days  OT Intensity: Minimum of 1-2 x/day, 45 to 90 minutes OT Frequency: 5 out of 7 days OT Duration/Estimated Length of Stay: ~ 5 days         Team Interventions: Nursing Interventions Patient/Family Education, Bladder Management, Disease Management/Prevention, Skin Care/Wound Management, Discharge Planning  PT interventions Ambulation/gait training, Balance/vestibular training, Cognitive remediation/compensation, Discharge planning, Disease management/prevention, DME/adaptive equipment instruction, Community reintegration, Functional mobility training, Neuromuscular re-education, Pain management, Patient/family education, Psychosocial  support, Skin care/wound management, Stair training, Therapeutic Activities, Therapeutic Exercise, UE/LE Strength taining/ROM, UE/LE Coordination activities, Wheelchair propulsion/positioning  OT Interventions Training and development officer, Community reintegration, Discharge planning, Disease mangement/prevention, Pain management, UE/LE Coordination activities, Skin care/wound managment, Functional mobility training, Self Care/advanced ADL retraining, UE/LE Strength taining/ROM, Therapeutic Exercise, Functional electrical stimulation, Psychosocial support, DME/adaptive equipment instruction, Therapeutic Activities, Patient/family education  SLP Interventions    TR Interventions    SW/CM Interventions Discharge Planning, Psychosocial Support, Patient/Family Education    Team Discharge Planning: Destination: PT-Home ,OT- Home , SLP-  Projected Follow-up: PT-Home health PT, Outpatient PT, OT-  None, SLP-  Projected Equipment Needs: PT-Other (comment) (Rollator), OT- To be determined, SLP-  Equipment Details: PT- , OT-  Patient/family involved in discharge planning: PT- Patient,  OT-Patient, SLP-   MD ELOS: 5-7d Medical Rehab Prognosis:  Good Assessment: 70 y.o. right handed male with history of obesity, hypertension, hyperlipidemia, PAF and poor anticoagulation candidate due to alcohol abuse and history of syncope, subclavian artery stenosis status post stenting. Patient lives with son and grandchildren. Independent prior to admission. Limited assistance of family. One level home with basement. Presented 08/19/2015 with episodes of dizziness and presyncope, chest discomfort and increasing dyspnea with exertion. Recent cardiac stress test positive for ischemia and subsequent cardiac catheterization demonstrated severe multivessel coronary artery disease. Echocardiogram with ejection fraction of 45% diffuse hypokinesis. Underwent CABG 4 08/20/2015 per [name] trigt. Hospital course acute blood loss anemia  8.0-8.3 and monitored. Currently maintained on aspirin 81 mg daily after CABG. Subcutaneous Lovenox for DVT  prophylaxis. Developed atrial fibrillation 08/22/2015 maintained on intravenous amiodarone and transitioned to Lopressor secondary to nausea from amiodarone. 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 A nasogastric tube initially placed. Underwent exploratory laparotomy right hemicolectomy with anastomosis 08/25/2015 per [name]. Nasogastric tube is been removed and diet advanced to regular consistency    Now requiring 24/7 Rehab RN,MD, as well as CIR level PT, OT.  Severe deconditioning 2 major surgeries in short time frame  Treatment team will focus on ADLs and mobility with goals set atmod I See Team Conference Notes for weekly updates to the plan of care

## 2015-09-11 NOTE — Progress Notes (Signed)
Physical Therapy Session Note  Patient Details  Name: Victor Diaz MRN: [id number] Date of Birth: 06-06-1945  Today's Date: 09/11/2015 PT Individual Time: 1010-1118 PT Individual Time Calculation (min): 68 min   Short Term Goals: Week 1:  PT Short Term Goal 1 (Week 1): STG= LTG due to ELOS  Skilled Therapeutic Interventions/Progress Updates:    Patient received sitting in [place] and agreeable to PT.   Patient performed gait training in controlled environment for 126ft x 4 and 103ft x 2 with supervision A from PT and min cues for improved step length and decreased support through UE. Gait training also performed on uneven surface for 180ft and 75 ft with supervision A from PT and use of rollator. PT provided min verbal cues for energy conservation and pursed lip breathing to aide in HR control.   PT administered Berg balance test: see below.  Dynamic balance training without UE support with anterior stepping beyond contralateral LE x 8 BLE  And Lateral stepping L and R x 8. Cues for improved weight shift and increased step length as tolerated by PT. PT provided min A throughout balance training for improved safety.   Patient left sitting in recliner at end of session.   Therapy Documentation Precautions:  Precautions Precautions: Fall, Sternal Restrictions Weight Bearing Restrictions: No Balance: Balance Balance Assessed: Yes Standardized Balance Assessment Standardized Balance Assessment: Berg Balance Test Berg Balance Test Sit to Stand: Able to stand without using hands and stabilize independently Standing Unsupported: Able to stand safely 2 minutes Sitting with Back Unsupported but Feet Supported on Floor or Stool: Able to sit safely and securely 2 minutes Stand to Sit: Sits safely with minimal use of hands Transfers: Able to transfer safely, minor use of hands Standing Unsupported with Eyes Closed: Able to stand 10 seconds with supervision Standing Ubsupported with Feet  Together: Able to place feet together independently and stand for 1 minute with supervision From Standing, Reach Forward with Outstretched Arm: Can reach confidently >25 cm (10") From Standing Position, Pick up Object from Floor: Able to pick up shoe, needs supervision From Standing Position, Turn to Look Behind Over each Shoulder: Turn sideways only but maintains balance Turn 360 Degrees: Able to turn 360 degrees safely but slowly Standing Unsupported, Alternately Place Feet on Step/Stool: Able to complete 4 steps without aid or supervision Standing Unsupported, One Foot in Front: Able to take small step independently and hold 30 seconds Standing on One Leg: Tries to lift leg/unable to hold 3 seconds but remains standing independently Total Score: 42   Patient demonstrates increased fall risk as noted by score of  42 /56 on Berg Balance Scale.  (<36= high risk for falls, close to 100%; 37-45 significant >80%; 46-51 moderate >50%; 52-55 lower >25%)    See Function Navigator for Current Functional Status.   Therapy/Group: Individual Therapy  Lorie Phenix 09/11/2015, 12:52 PM

## 2015-09-11 NOTE — Progress Notes (Signed)
Pt. Had 3 loose BM MD notified but he said it may be due to the laxative so he want to continue to monitor

## 2015-09-11 NOTE — Progress Notes (Signed)
Occupational Therapy Session Note  Patient Details  Name: Victor Diaz MRN: [id number] Date of Birth: 1945-06-21  Today's Date: 09/11/2015 OT Individual Time:  - 1100-1200  (60 min)      Short Term Goals: Week 1:  OT Short Term Goal 1 (Week 1): STG=LTG due to short LOS         Skilled Therapeutic Interventions/Progress Updates:    Pt sitting in recliner.  Ambulated with Rolator walker to shower with SBA.  .  Pt opted to put on hospital gown after shower.  Pt  Bathed self sitting and standing with SBA.  Pt performed sit to stand with grab bar and SBA.  Pt needed assist washing feet.  Educated pt on Adaptive equipment use and where to purchase in gift shop.  Pt stated he has use AE before when he had his hip surgery. Pt. Completed dressing using sock aid with min instructional cues.  Pt left in recliner with lunch and all needs in reach.   Therapy Documentation adn Precautions:  Precautions Precautions: Fall, Sternal Restrictions Weight Bearing Restrictions: No    Vital Signs: Therapy Vitals Pulse Rate: 98 BP: (!) 156/67 mmHg Patient Position (if appropriate): Sitting Oxygen Therapy SpO2: 100 % O2 Device: Not Delivered Pain: Pain Assessment Pain Assessment: No/denies pain     See Function Navigator for Current Functional Status.   Therapy/Group: Individual Therapy  Lisa Roca 09/11/2015, 12:00 PM

## 2015-09-11 NOTE — Progress Notes (Signed)
Physical Therapy Session Note  Patient Details  Name: Victor Diaz MRN: [id number] Date of Birth: 1945/12/06  Today's Date: 09/11/2015 PT Individual Time: 0757-0912 PT Individual Time Calculation (min): 75 min     Skilled Therapeutic Interventions/Progress Updates:  Patient in room at the beginning of session, no complains of pain , chest tightness or discomfort, agrees to therapy intervention. Assisted in retrieving clothes and with dressing min A, cues to maintain precautions, When asked to recall sternal precautions able to verbalize 1/3 .  Gait training with RW to the gym, SBA with cues for posture and to reduce weight bearing through UE.  Nu Step 2 x 6 min with resistance set at 3 in order to increase activity tolerance and strength. Education provided through out for energy conservation and s/s of exertion. Patient introduced to [facility] perceived exertion scale , verbalizes difficulty at level of 13(somewhat hard) presenting accuracy of exercise in order to achieve therapeutic effect. Training in navigating stairs, with B rails for balance, cues not to pull up with UE. Patient able to go up and down in reciprocal manner and with rest needed after completing staircase.  Patient requested to return to room to use the toilet, min A with donning and doffing lower body clothes. Rest provided after toileting as patient reports being very tired.  Training in energy conservation , breathing technique and activity tolerance during increased distance gait 3 x 150 feet with seated rest breaks provided as needed due to exertion, no chest pain noted. Patient returned to room, left in recliner with all needs within reach.   Therapy Documentation Precautions:  Precautions Precautions: Fall, Sternal Restrictions Weight Bearing Restrictions: No Vital Signs: Therapy Vitals Temp: 97.8 F (36.6 C) Temp Source: Oral Pulse Rate: 98 Resp: 18 BP: (!) 156/67 mmHg Patient Position (if appropriate):  Sitting Oxygen Therapy SpO2: 100 % O2 Device: Not Delivered Pain: Pain Assessment Pain Assessment: No/denies pain Therapy/Group: Individual Therapy  [name] 09/11/2015, 9:13 AM

## 2015-09-12 ENCOUNTER — Inpatient Hospital Stay (HOSPITAL_COMMUNITY): Payer: Medicare Other | Admitting: Occupational Therapy

## 2015-09-12 ENCOUNTER — Inpatient Hospital Stay (HOSPITAL_COMMUNITY): Payer: Medicare Other | Admitting: Physical Therapy

## 2015-09-12 NOTE — Progress Notes (Signed)
Physical Therapy Session Note  Patient Details  Name: Victor Diaz MRN: [id number] Date of Birth: January 03, 1946  Today's Date: 09/12/2015 PT Individual Time: 1300-1345 PT Individual Time Calculation (min): 45 min   Short Term Goals: Week 1:  PT Short Term Goal 1 (Week 1): STG= LTG due to ELOS  Skilled Therapeutic Interventions/Progress Updates:  Pt was seen bedside in the pm. Pt transferred supine to edge of bed with S. Pt performed all transfers with S, able to maintain sternal precautions throughout treatment. Pt ambulated 150 feet with 4WW and S. Pt ambulated 150 and 200 feet without assistive device and S. Pt ambulated through slalom course 50 feet x 3 with S and verbal cues. Pt rode Nu-step 5 minutes at level 4 x 2. Following treatment pt returned to room and transferred edge of bed to supine with S. Pt left sitting up in bed with call bell within reach.   Therapy Documentation Precautions:  Precautions Precautions: Fall, Sternal Restrictions Weight Bearing Restrictions: No General:   Pain: No c/o pain.   See Function Navigator for Current Functional Status.   Therapy/Group: Individual Therapy  Dub Amis 09/12/2015, 1:32 PM

## 2015-09-12 NOTE — Plan of Care (Signed)
Problem: RH Stairs Goal: LTG Patient will ambulate up and down stairs w/assist (PT) LTG: Patient will ambulate up and down # of stairs with assistance (PT) 3 steps with 1 rail

## 2015-09-12 NOTE — Plan of Care (Signed)
Problem: RH Other (Specify) Goal: RH LTG Other (Specify)1 Pt will negotiate 2 steps without rails with Mod I & LRAD.

## 2015-09-12 NOTE — Progress Notes (Signed)
Physical Therapy Session Note  Patient Details  Name: ZALE BOEKE MRN: [id number] Date of Birth: 1946/03/15  Today's Date: 09/12/2015 PT Individual Time: 1050-1200 PT Individual Time Calculation (min): 70 min   Short Term Goals: Week 1:  PT Short Term Goal 1 (Week 1): STG= LTG due to ELOS  Skilled Therapeutic Interventions/Progress Updates:    Pt received in recliner & agreeable to PT, denying c/o pain. Gait training room>gym x 100 ft with rollator & supervision A.  Pt able to demonstrate car transfer at [place] simulated height & bed mobility in rehab apartment with supervision A. Pt requires cuing throughout session to lock rollator brakes when appropriate. Pt able to verbalize & maintain sternal precautions throughout session. Discussed d/c plans with pt & his inability to care for small children following d/c & pt agreeable. Pt reports he plans to stay with son for ~1 week after d/c from CIR. Pt's son's house has 2-3 steps & 1 rail to enter, while pt's home has 2 steps without rails to enter. Stair training without rails over eight 3-inch high and four 6 inch high steps but pt tends to lean on rails with forearms for support & requires min A for balance. Gait training x 90 ft + 110 ft without AD with min guard fade to close supervision. Pt with decreased step length BLE & shuffled gait, PT provided cuing for increased step length but pt with difficulty demonstrating this. Pt reports he is walking faster than he was PTA. Pt able to complete furniture transfer from couch in rehab apartment with supervision A & maintaining sternal precautions. Provided pt with OTAGO Level A exercises & pt completed the following: seated long arc quads, standing hamstring curls, standing hip abduction, mini squats, tandem stance, and 5x sit-to-stand without BUE support. Pt required rest breaks as needed between exercises 2/2 fatigue. Gait training x 150 ft back to room without AD & supervision, pt voiced concerns  regarding falling off of hospital bed. Pt transferred in/out of hospital bed & demonstrated rolling with PT reinforcing that pt should not use bed rails to maintain precautions. Educated pt to scoot buttocks towards center of bed when he feels as though he is too close to edge & pt able to demonstrate. Pt completed two trials of 5x sit-to-stand without BUE support in 30 seconds & 21 seconds respectively. At end of session pt left in recliner with BLE elevated & all needs within reach.   Therapy Documentation Precautions:  Precautions Precautions: Fall, Sternal Restrictions Weight Bearing Restrictions: No  Pain: Pain Assessment Pain Assessment: No/denies pain   See Function Navigator for Current Functional Status.   Therapy/Group: Individual Therapy  [name] 09/12/2015, 8:05 AM

## 2015-09-12 NOTE — Progress Notes (Signed)
[facility] PHYSICAL MEDICINE & REHABILITATION     PROGRESS NOTE  Subjective/Complaints:  No abd pain, 2 cont BM yesterday  ROS: Denies CP, SOB, nausea, vomiting, diarrhea.  Objective: Vital Signs: Blood pressure 159/64, pulse 93, temperature 97.6 F (36.4 C), temperature source Oral, resp. rate 18, height 5\' 6"  (1.676 m), weight 88.225 kg (194 lb 8 oz), SpO2 97 %. No results found.  Recent Labs  09/09/15 1708 09/10/15 0645  WBC 5.3 5.4  HGB 9.3* 9.4*  HCT 29.2* 29.2*  PLT 254 264    Recent Labs  09/09/15 1708 09/10/15 0645  NA  --  129*  K  --  4.3  CL  --  97*  GLUCOSE  --  108*  BUN  --  15  CREATININE 1.12 1.10  CALCIUM  --  8.4*   CBG (last 3)  No results for input(s): GLUCAP in the last 72 hours.  Wt Readings from Last 3 Encounters:  09/12/15 88.225 kg (194 lb 8 oz)  09/09/15 99.7 kg (219 lb 12.8 oz)  08/13/15 92.987 kg (205 lb)    Physical Exam:  BP 159/64 mmHg  Pulse 93  Temp(Src) 97.6 F (36.4 C) (Oral)  Resp 18  Ht 5\' 6"  (1.676 m)  Wt 88.225 kg (194 lb 8 oz)  BMI 31.41 kg/m2  SpO2 97% Constitutional: He appears well-developed and well-nourished. NAD. HENT: Normocephalic and atraumatic.  Eyes: Conjunctivae and EOM are normal.  Neck: Normal range of motion. Neck supple. No thyromegaly present.  Cardiovascular: Irregularly irregular  Respiratory: Effort normal and breath sounds normal. No respiratory distress.  GI: Soft. Bowel sounds are normal. He exhibits no distension. Obese  Musculoskeletal: He exhibits edema. He exhibits no tenderness. Neurological: He is alert and oriented.  Follows simple commands Motor: 4-4+/5 bilateral deltoids, biceps, triceps, grip Bilateral lower extremities: 4/5 in the hip flexors and knee extensors, 4+ ankle dorsiflexors and plantar flexors Skin: Skin is warm and dry. Incision c/d/i.  Midline chest incision clean and dry  Psychiatric: He has a normal mood and affect. His behavior is  normal   Assessment/Plan: 1. Functional deficits secondary to deconditioning which require 3+ hours per day of interdisciplinary therapy in a comprehensive inpatient rehab setting. Physiatrist is providing close team supervision and 24 hour management of active medical problems listed below. Physiatrist and rehab team continue to assess barriers to discharge/monitor patient progress toward functional and medical goals.  Function:  Bathing Bathing position   Position: Shower  Bathing parts Body parts bathed by patient: Right arm, Left arm, Chest, Abdomen, Front perineal area, Buttocks, Right upper leg, Left upper leg Body parts bathed by helper: Right lower leg, Left lower leg, Back  Bathing assist Assist Level: Supervision or verbal cues      Upper Body Dressing/Undressing Upper body dressing   What is the patient wearing?: Pull over shirt/dress     Pull over shirt/dress - Perfomed by patient: Thread/unthread right sleeve, Thread/unthread left sleeve, Put head through opening, Pull shirt over trunk          Upper body assist Assist Level: Set up   Set up : To obtain clothing/put away  Lower Body Dressing/Undressing Lower body dressing   What is the patient wearing?: [facility], [place], Pants, Shoes, Socks, Non-skid Museum/gallery curator - Performed by patient: Thread/unthread right underwear leg, Thread/unthread left underwear leg, Pull underwear up/down   Pants- Performed by patient: Thread/unthread right pants leg, Thread/unthread left pants leg, Pull pants up/down   Non-skid  slipper socks- Performed by patient: Don/doff right sock, Don/doff left sock   Socks - Performed by patient: Don/doff right sock, Don/doff left sock (used sock aid) Socks - Performed by helper: Don/doff right sock, Don/doff left sock   Shoes - Performed by helper: Don/doff right shoe, Don/doff left shoe, Fasten right, Fasten left          Lower body assist Assist for lower body dressing:  Touching or steadying assistance (Pt > 75%)      Toileting Toileting   Toileting steps completed by patient: Performs perineal hygiene Toileting steps completed by helper: Adjust clothing prior to toileting, Adjust clothing after toileting Toileting Assistive Devices: Grab bar or rail  Toileting assist Assist level: Touching or steadying assistance (Pt.75%)   Transfers Chair/bed transfer   Chair/bed transfer method: Stand pivot Chair/bed transfer assist level: Touching or steadying assistance (Pt > 75%) Chair/bed transfer assistive device: Armrests, Medical sales representative     Max distance: 179ft Assist level: Touching or steadying assistance (Pt > 75%)   Wheelchair Wheelchair activity did not occur: N/A        Cognition Comprehension Comprehension assist level: Follows complex conversation/direction with no assist, Follows basic conversation/direction with no assist  Expression Expression assist level: Expresses basic needs/ideas: With no assist  Social Interaction Social Interaction assist level: Interacts appropriately with others with medication or extra time (anti-anxiety, antidepressant). (ativan)  Problem Solving Problem solving assist level: Solves basic problems with no assist  Memory Memory assist level: Complete Independence: No helper    Medical Problem List and Plan: 1. 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  CIR 2. DVT Prophylaxis/Anticoagulation: Subcutaneous Lovenox. Monitor platelet counts and any signs of bleeding 3. Pain Management: Ultram as needed 4. Mood: Ativan 0.5 mg daily at bedtime 5. Neuropsych: This patient is capable of making decisions on his own behalf. 6. Skin/Wound Care: Routine skin checks 7.  Fluids/Electrolytes/Nutrition: Routine I&O's, eating 100% meals 8. Acute blood loss anemia.   Hb 9.4 on 5/26, stable   Will cont to monitor  Labs ordered for Monday 9. Hypertension/atrial fibrillation. Amiodarone 200 mg twice a day, lisinopril 2.5 mg daily, Lasix 40 mg twice a day, Toprol 15 mg daily. Cardiac rate controlled. Monitor with increased mobility Filed Vitals:   09/11/15 2010 09/12/15 0604  BP: 141/66 159/64  Pulse: 90 93  Temp:  97.6 F (36.4 C)  Resp:  18   10. History of syncope with subclavian artery stenosis status post stenting. Check orthostatic blood pressures 11. History of alcohol abuse. Counseling 12. Hyperlipidemia. No Zocor while on amiodarone. Patient has allergy to Lipitor 13. Hyponatremia: asymptomatic  Na 129 on 5/26, appears to be stable  Will cont to monitor  Labs ordered for Monday  LOS (Days) 3 A FACE TO FACE EVALUATION WAS PERFORMED  Charlett Blake 09/12/2015 9:48 AM

## 2015-09-12 NOTE — Progress Notes (Addendum)
Occupational Therapy Session Note  Patient Details  Name: OTHA KELLNER MRN: [id number] Date of Birth: 1946-03-08  Today's Date: 09/12/2015 OT Individual Time:  -   [id number]  (75 min)      Short Term Goals: Week 1:  OT Short Term Goal 1 (Week 1): STG=LTG due to short LOS :     Skilled Therapeutic Interventions/Progress Updates:    Skilled OT intervention with treatment focus on the following: sit to stand, balance, functional mobility, endurance.  Pt. Ambulated with rolator walker to shower.  Needed cues to gather necessary clothes and supplies .  Pt. Bathed in sitting and standing with SBA using grab bar.  Pt dressed in recliner.  He used shoe funnel and reacher for LB.  He ambulated to pick up towels with reacher and then diid grooming at sink in sitting  Recalled 2/3 sternal precautions.  Needed cues to not use hands with sit to stand. Ambulated with Rolator to gym.   Nustep=  5.5 minutes, 3wkload; HR- 102, O2= 99%; BP=  133/62;  Ambulated to room with SBA.   Left pt in recliner,  call bell,phone within reach.   Therapy Documentation Precautions:  Precautions Precautions: Fall, Sternal Restrictions Weight Bearing Restrictions: No    Vital Signs: Therapy Vitals Temp: 97.6 F (36.4 C) Temp Source: Oral Pulse Rate: 93 Resp: 18 BP: (!) 159/64 mmHg Patient Position (if appropriate): Lying Oxygen Therapy SpO2: 93 % O2 Device: Not Delivered Pain:  none             See Function Navigator for Current Functional Status.   Therapy/Group: Individual Therapy  Lisa Roca 09/12/2015, 8:04 AM

## 2015-09-12 NOTE — Plan of Care (Signed)
Problem: RH Balance Goal: LTG Patient will maintain dynamic standing balance (PT) LTG: Patient will maintain dynamic standing balance with assistance during mobility activities (PT) With LRAD  Problem: RH Bed Mobility Goal: LTG Patient will perform bed mobility with assist (PT) LTG: Patient will perform bed mobility with assistance, with/without cues (PT). Regular bed  Problem: RH Bed to Chair Transfers Goal: LTG Patient will perform bed/chair transfers w/assist (PT) LTG: Patient will perform bed/chair transfers with assistance, with/without cues (PT). With LRAD  Problem: RH Car Transfers Goal: LTG Patient will perform car transfers with assist (PT) LTG: Patient will perform car transfers with assistance (PT). With LRAD  Problem: RH Furniture Transfers Goal: LTG Patient will perform furniture transfers w/assist (OT/PT LTG: Patient will perform furniture transfers with assistance (OT/PT). With LRAD  Problem: RH Ambulation Goal: LTG Patient will ambulate in controlled environment (PT) LTG: Patient will ambulate in a controlled environment, # of feet with assistance (PT). 150 ft with LRAD Goal: LTG Patient will ambulate in home environment (PT) LTG: Patient will ambulate in home environment, # of feet with assistance (PT). 64 ft with LRAD Goal: LTG Patient will ambulate in community environment (PT) LTG: Patient will ambulate in community environment, # of feet with assistance (PT). 150 ft with LRAD  Problem: RH Stairs Goal: LTG Patient will ambulate up and down stairs w/assist (PT) LTG: Patient will ambulate up and down # of stairs with assistance (PT) 3 steps with 1 rail

## 2015-09-13 ENCOUNTER — Encounter (HOSPITAL_COMMUNITY): Payer: Medicare Other

## 2015-09-13 ENCOUNTER — Inpatient Hospital Stay (HOSPITAL_COMMUNITY): Payer: Medicare Other | Admitting: Physical Therapy

## 2015-09-13 ENCOUNTER — Inpatient Hospital Stay (HOSPITAL_COMMUNITY): Payer: Medicare Other | Admitting: Occupational Therapy

## 2015-09-13 LAB — CBC WITH DIFFERENTIAL/PLATELET
BASOS ABS: 0 10*3/uL (ref 0.0–0.1)
BASOS PCT: 0 %
Eosinophils Absolute: 0.1 10*3/uL (ref 0.0–0.7)
Eosinophils Relative: 1 %
HEMATOCRIT: 30.6 % — AB (ref 39.0–52.0)
HEMOGLOBIN: 9.7 g/dL — AB (ref 13.0–17.0)
Lymphocytes Relative: 26 %
Lymphs Abs: 1.5 10*3/uL (ref 0.7–4.0)
MCH: 29.8 pg (ref 26.0–34.0)
MCHC: 31.7 g/dL (ref 30.0–36.0)
MCV: 94.2 fL (ref 78.0–100.0)
Monocytes Absolute: 0.5 10*3/uL (ref 0.1–1.0)
Monocytes Relative: 9 %
NEUTROS ABS: 3.8 10*3/uL (ref 1.7–7.7)
NEUTROS PCT: 64 %
Platelets: 278 10*3/uL (ref 150–400)
RBC: 3.25 MIL/uL — ABNORMAL LOW (ref 4.22–5.81)
RDW: 15.5 % (ref 11.5–15.5)
WBC: 5.9 10*3/uL (ref 4.0–10.5)

## 2015-09-13 LAB — BASIC METABOLIC PANEL
ANION GAP: 8 (ref 5–15)
BUN: 17 mg/dL (ref 6–20)
CALCIUM: 8.7 mg/dL — AB (ref 8.9–10.3)
CO2: 22 mmol/L (ref 22–32)
Chloride: 98 mmol/L — ABNORMAL LOW (ref 101–111)
Creatinine, Ser: 1.43 mg/dL — ABNORMAL HIGH (ref 0.61–1.24)
GFR, EST AFRICAN AMERICAN: 55 mL/min — AB (ref >60)
GFR, EST NON AFRICAN AMERICAN: 48 mL/min — AB (ref >60)
Glucose, Bld: 110 mg/dL — ABNORMAL HIGH (ref 65–99)
Potassium: 4 mmol/L (ref 3.5–5.1)
SODIUM: 128 mmol/L — AB (ref 135–145)

## 2015-09-13 MED ORDER — FLUTICASONE PROPIONATE 50 MCG/ACT NA SUSP
1.0000 | Freq: Every day | NASAL | Status: DC
  Filled 2015-09-13: qty 16

## 2015-09-13 MED ORDER — LORATADINE 10 MG PO TABS
10.0000 mg | ORAL_TABLET | Freq: Every day | ORAL | Status: DC
  Administered 2015-09-13 – 2015-09-15 (×3): 10 mg via ORAL
  Filled 2015-09-13 (×3): qty 1

## 2015-09-13 NOTE — Plan of Care (Signed)
Problem: RH PAIN MANAGEMENT Goal: RH STG PAIN MANAGED AT OR BELOW PT'S PAIN GOAL Less than 46m,on 1 to 10 scale.  3/10

## 2015-09-13 NOTE — Progress Notes (Signed)
PHYSICAL MEDICINE & REHABILITATION     PROGRESS NOTE  Subjective/Complaints:  Patient sitting up in bed this morning. He states that he is feeling restless little anxious. He notes that he was a heavy drinker prior to admission.  ROS: + Restless. Denies CP, SOB, nausea, vomiting, diarrhea.  Objective: Vital Signs: Blood pressure 129/77, pulse 99, temperature 98.1 F (36.7 C), temperature source Oral, resp. rate 18, height 5\' 6"  (1.676 m), weight 87.317 kg (192 lb 8 oz), SpO2 97 %. No results found. No results for input(s): WBC, HGB, HCT, PLT in the last 72 hours. No results for input(s): NA, K, CL, GLUCOSE, BUN, CREATININE, CALCIUM in the last 72 hours.  Invalid input(s): CO CBG (last 3)  No results for input(s): GLUCAP in the last 72 hours.  Wt Readings from Last 3 Encounters:  09/13/15 87.317 kg (192 lb 8 oz)  09/09/15 99.7 kg (219 lb 12.8 oz)  08/13/15 92.987 kg (205 lb)    Physical Exam:  BP 129/77 mmHg  Pulse 99  Temp(Src) 98.1 F (36.7 C) (Oral)  Resp 18  Ht 5\' 6"  (1.676 m)  Wt 87.317 kg (192 lb 8 oz)  BMI 31.08 kg/m2  SpO2 97% Constitutional: He appears well-developed and well-nourished. NAD. HENT: Normocephalic and atraumatic.  Eyes: Conjunctivae and EOM are normal.  Neck: Normal range of motion. Neck supple. No thyromegaly present.  Cardiovascular: Irregularly irregular  Respiratory: Effort normal and breath sounds normal. No respiratory distress.  GI: Soft. Bowel sounds are normal. He exhibits no distension. Obese  Musculoskeletal: He exhibits edema. He exhibits no tenderness. Neurological: He is alert and oriented.  Follows simple commands Motor: 4+/5 bilateral deltoids, biceps, triceps, grip Bilateral lower extremities: 4+/5 in the hip flexors and knee extensors, 4+ ankle dorsiflexors and plantar flexors Skin: Skin is warm and dry. Incision c/d/i.  Midline chest incision clean and dry  Psychiatric: He has a normal mood and affect. His  behavior is normal   Assessment/Plan: 1. Functional deficits secondary to deconditioning which require 3+ hours per day of interdisciplinary therapy in a comprehensive inpatient rehab setting. Physiatrist is providing close team supervision and 24 hour management of active medical problems listed below. Physiatrist and rehab team continue to assess barriers to discharge/monitor patient progress toward functional and medical goals.  Function:  Bathing Bathing position   Position: Shower  Bathing parts Body parts bathed by patient: Right arm, Left arm, Chest, Abdomen, Front perineal area, Buttocks, Right upper leg, Left upper leg Body parts bathed by helper: Right lower leg, Left lower leg, Back  Bathing assist Assist Level: Supervision or verbal cues      Upper Body Dressing/Undressing Upper body dressing   What is the patient wearing?: Pull over shirt/dress     Pull over shirt/dress - Perfomed by patient: Thread/unthread right sleeve, Thread/unthread left sleeve, Put head through opening, Pull shirt over trunk          Upper body assist Assist Level: Set up   Set up : To obtain clothing/put away  Lower Body Dressing/Undressing Lower body dressing   What is the patient wearing?: Underwear, Pants, Shoes Underwear - Performed by patient: Thread/unthread right underwear leg, Thread/unthread left underwear leg, Pull underwear up/down   Pants- Performed by patient: Thread/unthread right pants leg, Thread/unthread left pants leg, Pull pants up/down   Non-skid slipper socks- Performed by patient: Don/doff right sock, Don/doff left sock   Socks - Performed by patient: Don/doff right sock, Don/doff left sock Socks - Performed  by helper: Don/doff right sock, Don/doff left sock   Shoes - Performed by helper: Don/doff right shoe, Don/doff left shoe, Fasten right, Fasten left          Lower body assist Assist for lower body dressing: Touching or steadying assistance (Pt > 75%)       Toileting Toileting   Toileting steps completed by patient: Adjust clothing prior to toileting, Performs perineal hygiene, Adjust clothing after toileting Toileting steps completed by helper: Adjust clothing prior to toileting, Adjust clothing after toileting Toileting Assistive Devices: Grab bar or rail  Toileting assist Assist level: Touching or steadying assistance (Pt.75%)   Transfers Chair/bed transfer   Chair/bed transfer method: Ambulatory Chair/bed transfer assist level: Supervision or verbal cues Chair/bed transfer assistive device: Armrests, Medical sales representative     Max distance: 200 Assist level: Supervision or verbal cues   Wheelchair Wheelchair activity did not occur: N/A        Cognition Comprehension Comprehension assist level: Follows complex conversation/direction with no assist, Follows basic conversation/direction with no assist  Expression Expression assist level: Expresses basic needs/ideas: With no assist  Social Interaction Social Interaction assist level: Interacts appropriately with others with medication or extra time (anti-anxiety, antidepressant). (ativan)  Problem Solving Problem solving assist level: Solves basic problems with no assist  Memory Memory assist level: Complete Independence: No helper    Medical Problem List and Plan: 1. 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 DVT Prophylaxis/Anticoagulation: Subcutaneous Lovenox. Monitor platelet counts and any signs of bleeding 3. Pain Management: Ultram as needed 4. Mood: Ativan 0.5 mg daily at bedtime 5. Neuropsych: This patient is capable of making decisions on his own behalf. 6. Skin/Wound Care: Routine skin checks 7. Fluids/Electrolytes/Nutrition: Routine I&O's, eating 100% meals 8. Acute blood loss anemia.   Hb 9.4 on 5/26,  stable   Will cont to monitor  Labs pending 9. Hypertension/atrial fibrillation. Amiodarone 200 mg twice a day, lisinopril 2.5 mg daily, Lasix 40 mg twice a day, Toprol 15 mg daily. Cardiac rate controlled. Monitor with increased mobility Filed Vitals:   09/12/15 2005 09/13/15 0544  BP: 113/64 129/77  Pulse: 84 99  Temp: 97.6 F (36.4 C) 98.1 F (36.7 C)  Resp: 19 18   10. History of syncope with subclavian artery stenosis status post stenting. Check orthostatic blood pressures 11. History of alcohol abuse. Counseling 12. Hyperlipidemia. No Zocor while on amiodarone. Patient has allergy to Lipitor 13. Hyponatremia: asymptomatic  Na 129 on 5/26, appears to be stable  Will cont to monitor  Labs pending  LOS (Days) 4 A FACE TO FACE EVALUATION WAS PERFORMED  Victor Diaz Lorie Phenix 09/13/2015 8:34 AM

## 2015-09-13 NOTE — Progress Notes (Signed)
Occupational Therapy Session Note  Patient Details  Name: Victor Diaz MRN: [id number] Date of Birth: 10-15-1945  Today's Date: 09/13/2015 OT Individual Time: 1418-1530 OT Individual Time Calculation (min): 72 min    Short Term Goals: Week 1:  OT Short Term Goal 1 (Week 1): STG=LTG due to short LOS  Skilled Therapeutic Interventions/Progress Updates:    Pt completed bed mobility on his bed in his room and the bed in the ADL apartment with modified independence.  Completed sit to stand intervals for EOB, couch, and bedside chair with occasional supervision secondary to not having his feet far enough back and not completing enough trunk flexion.  Had pt practice tub shower transfers with use of tub bench and also stepping over edge of the tub.  He needed supervision with both ways.  Worked on functional mobility and endurance while ambulating without assistive device.  Pt needing 2 rest breaks in sitting to complete ambulation out to the [employer].  After brief rest outside and discussion of energy conservation strategies, he needed 3 rest breaks to walk back to the ADL apartment.  Finished session by having him fix a small microwave snack as this is something he may have to do at home.  His family will provide most of his meals. Pt left sitting in recliner chair with call button and phone within reach.    Therapy Documentation Precautions:  Precautions Precautions: Fall, Sternal Precaution Comments: Chest tubes, [place] Restrictions Weight Bearing Restrictions: No  Pain: Pain Assessment Pain Assessment: No/denies pain ADL: See Function Navigator for Current Functional Status.   Therapy/Group: Individual Therapy  Lani Mendiola OTR/L 09/13/2015, 4:11 PM

## 2015-09-13 NOTE — Progress Notes (Signed)
Physical Therapy Session Note  Patient Details  Name: Victor Diaz MRN: [id number] Date of Birth: 21-Jan-1946  Today's Date: 09/13/2015 PT Individual Time: 1102-1200 PT Individual Time Calculation (min): 58 min   Short Term Goals: Week 1:  PT Short Term Goal 1 (Week 1): STG= LTG due to ELOS  Skilled Therapeutic Interventions/Progress Updates:    Pt received in bed & agreeable to PT, denying c/o pain. Pt transferred supine>sit with cuing to not hold on to bed rail; pt reported need to use restroom & ambulated to bathroom without AD & supervision/mod I. Pt able to complete standing toileting tasks with mod I; (+) void. Gait training room>gym x 100 ft without AD & supervision fading to mod I. Pt with decreased step length BLE & decreased cadence. Pt reports fatigue on this date & strongly wishes to use rollator but does not verbalize why. Educated pt on benefits & disadvantages of using an AD. In gym, utilized nu-step on Level 3-5 x 10 minutes without BUE for BLE strengthening & endurance training with pt reporting 13-14 on BORG RPE scale. Pt demonstrated decreased memory, asking therapist "was I asleep when you came in?". Pt stood on compliant surface while playing matching game to address memory, balance & endurance. Pt able to tolerate standing on surface 4 minutes 15 seconds + 2 minutes 33 seconds with seated rest breaks in between & cuing for remembering location of cards. Pt requested to take break in chair with back support 2/2 fatigue sitting without support. Utilized Diplomatic Services operational officer to challenge balance & pt able to maintain balance without BUE support and loss of balance while accepting challenges laterally & anterior/posteriorly. Gait training back to room & pt left in recliner with all needs within reach & BLE elevated.   Therapy Documentation Precautions:  Precautions Precautions: Fall, Sternal Restrictions Weight Bearing Restrictions: No  Pain: Pain Assessment Pain Assessment:  No/denies pain   See Function Navigator for Current Functional Status.   Therapy/Group: Individual Therapy  [name] 09/13/2015, 7:51 AM

## 2015-09-13 NOTE — Progress Notes (Signed)
Occupational Therapy Session Note  Patient Details  Name: Victor Diaz MRN: [id number] Date of Birth: [birth year]  Today's Date: 09/13/2015 OT Individual Time: [id number] OT Individual Time Calculation (min): 75 min    Short Term Goals: Week 1:  OT Short Term Goal 1 (Week 1): STG=LTG due to short LOS  Skilled Therapeutic Interventions/Progress Updates:    Pt seen for OT ADL bathing/dressing session. Pt sitting up in recliner upon arrival, voicing increased fatigue today, however, willing to attempt therapy He ambulated throughout room with supervision using rollaor. He bathed seated on tub bench with set-up assist and distant supervision.  He dressed from recliner level. Introduced to new technique of donning shoes to cross ankle over toes as before he required use of multiple AE which he does not currently own. Pt able to don shoes from this position. He was provided with elastic shoelaces as he was unable to tie shoe. With elastic shoelaces, pt able to don B shoes with increased time. He ambulated throughout unit, requiring rest breaks throughout, tolerating ~30 ft of ambulation at a time before requiring a seated rest break. Educated regarding energy conservation and prioritizing tasks for optimum energy and performance. In ADL apartment, completed simulated tub/shower transfer using tub bench. Completed at supervision- mod I level following VCs for technique. Educated regarding DME, recommend tub bench for home use for increased safety with bathing tasks.  He returned to room at end of session, left sitting in recliner with all needs in reach.     Therapy Documentation Precautions:  Precautions Precautions: Fall, Sternal Restrictions Weight Bearing Restrictions: No Pain:    See Function Navigator for Current Functional Status.   Therapy/Group: Individual Therapy  Lewis, Leighanne Adolph C 09/13/2015, 6:46 AM

## 2015-09-14 ENCOUNTER — Inpatient Hospital Stay (HOSPITAL_COMMUNITY): Payer: Medicare Other | Admitting: Occupational Therapy

## 2015-09-14 ENCOUNTER — Inpatient Hospital Stay (HOSPITAL_COMMUNITY): Payer: Medicare Other | Admitting: Physical Therapy

## 2015-09-14 DIAGNOSIS — N179 Acute kidney failure, unspecified: Secondary | ICD-10-CM | POA: Insufficient documentation

## 2015-09-14 MED ORDER — FUROSEMIDE 40 MG PO TABS
40.0000 mg | ORAL_TABLET | Freq: Every day | ORAL | Status: DC
  Administered 2015-09-15: 40 mg via ORAL
  Filled 2015-09-14: qty 1

## 2015-09-14 NOTE — Discharge Summary (Signed)
Victor Diaz, Victor Diaz              ACCOUNT NO.:  [id number]  MEDICAL RECORD NO.:  [id number]  LOCATION:  [id number]                        FACILITY:  [facility]  PHYSICIAN:  Delice Lesch, MD        DATE OF BIRTH:  October 24, 1945  DATE OF ADMISSION:  09/09/2015 DATE OF DISCHARGE:  09/15/2015                              DISCHARGE SUMMARY   DISCHARGE DIAGNOSES: 1. 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 2. Subcutaneous Lovenox for DVT prophylaxis. 3. Pain management. 4. Acute blood loss anemia. 5. History of atrial fibrillation as well as hypertension. 6. History of syncope with subclavian artery stenosis status post     stenting. 7. History of alcohol abuse. 8. Hyperlipidemia. 9. Hyponatremia, asymptomatic.  HISTORY OF PRESENT ILLNESS:  This is a 70 year old right-handed male, history of obesity, hypertension, PAF, and poor anticoagulation candidate due to alcohol abuse as well as history of syncope with subclavian artery stenosis status post stenting.  The patient lives with son and grandchildren.  Independent prior to admission.  Presented on Aug 19, 2015, with episodes of dizziness, presyncope, chest discomfort, and increasing dyspnea with exertion.  Recent cardiac stress test, positive for ischemia and subsequent cardiac catheterization demonstrated severe multivessel coronary artery disease.  Echocardiogram with ejection fraction of 45% diffuse hypokinesis.  Underwent CABG on Aug 20, 2015.  Acute blood loss anemia 8.0-8.3 and monitored.  Sternal precautions.  Subcutaneous Lovenox for DVT prophylaxis.  Maintained on aspirin.  Developed atrial fibrillation on Aug 22, 2015, maintained on amiodarone, transition to by mouth.  Developed ileus identified by KUB with cecal volvulus and General surgery consulted.  A nasogastric tube initially placed, underwent exploratory laparotomy  and right hemicolectomy with anastomosis on Aug 25, 2015, per [name]. Nasogastric tube removed, diet steadily advanced.  Physical and occupational therapy ongoing.  The patient was admitted for comprehensive rehab program.  PAST MEDICAL HISTORY:  See discharge diagnoses.  SOCIAL HISTORY:  Lives with and grand children, independent prior to admission.  Functional status upon admission to rehab services was minimal guard 180 feet with a 4-wheeled walker; minimal guard, sit to stand; min to mod assist for activities of daily living.  PHYSICAL EXAMINATION:  VITAL SIGNS:  Blood pressure 149/73, pulse 88, temperature 98, respirations 17. GENERAL:  This was an alert, cooperative male in no acute distress, oriented x3. LUNGS:  Clear to auscultation without wheeze. CARDIAC:  Irregularly irregular. ABDOMEN:  Soft, nontender.  Good bowel sounds.  Midline abdominal incision dressed. Midline chest incision well healed.  REHABILITATION HOSPITAL COURSE:  The patient was admitted to inpatient rehab services with therapies initiated on a 3-hour daily basis, consisting of physical therapy, occupational therapy, and rehabilitation nursing.  The following issues were addressed during the patient's rehabilitation stay.  Pertaining to [name] debilitation after CABG complicated by postoperative ileus with cecal volvulus, undergone right hemicolectomy on Aug 25, 2015.  Surgical site healing nicely, he was receiving dressing changes to abdominal wound.  No chest pain or shortness of breath.  Sternal precautions.  He would follow up  with General Surgery as well as Cardiothoracic Surgery.  Subcutaneous Lovenox for DVT prophylaxis, no bleeding episodes.  He was using Ultram on a limited basis for pain.  Blood pressures remained well controlled on Toprol 25 mg daily and lisinopril 2.5 mg daily as well as Lasix 40 mg daily, exhibiting no signs of fluid overload.  Cardiac rate remained controlled on  amiodarone 200 mg twice daily.  History of hyperlipidemia. No Zocor while on amiodarone.  He has an allergy to LIPITOR.  Noted significant history of alcohol abuse.  He received full counsel in regard to cessation of alcohol.  It was questionable if he would be compliant with these requests.  He did receive a consult from neuropsychology in regard to his alcohol use.  The patient received weekly collaborative interdisciplinary team conferences to discuss estimated length of stay, family teaching, and any barriers to discharge.  Transferred supine to edge of bed with supervision. Performed all transfers with supervision maintaining sternal precautions.  Ambulated 150 feet to 200 feet without assistive device. Strength and endurance continued to greatly improve.  He could gather his belongings for activities of daily living and homemaking.  Full family teaching was completed.  It was discussed adamantly no driving and no alcohol.  The patient was discharged to home.  DISCHARGE MEDICATIONS: 1. Amiodarone 200 mg p.o. b.i.d. 2. Aspirin 325 mg p.o. daily. 3. Colace 200 mg p.o. daily. 4. Flonase 1 spray each nostril daily. 5. Lasix 40 mg p.o. daily. 6. Lisinopril 2.5 mg p.o. daily. 7. Claritin 10 mg p.o. daily. 8. Ativan 0.5 mg p.o. at bedtime. 9. Reglan 10 mg p.o. q.i.d. 10.Toprol-XL 25 mg p.o. daily. 11.Protonix 40 mg p.o. daily. 12.Potassium chloride 20 mEq p.o. b.i.d. 13.Ultram 50 mg p.o. every 6 hours as needed moderate pain, dispense     of 60 tablets.  DIET:  His diet was a regular consistency.  FOLLOWUP:  He would follow up with Dr. Posey Pronto at the outpatient rehab service office as needed; Dr. Tharon Aquas Trigt, Cardiothoracic Surgery, 2 weeks, call for appointment; [name], General surgery, call for appointment; Dr. Carlisle Cater, Medical Management, on September 24, 2015.  SPECIAL INSTRUCTIONS:  No driving.  No alcohol.  Continue sternal precautions.     Lauraine Rinne,  P.A.   ______________________________ Delice Lesch, MD    DA/MEDQ  D:  09/14/2015  T:  09/14/2015  Job:  [id number]  cc:   Dorothyann Peng, Dr. Ralene Ok, Dr.

## 2015-09-14 NOTE — Discharge Summary (Signed)
Discharge summary job # [id number]

## 2015-09-14 NOTE — Progress Notes (Signed)
Physical Therapy Discharge Summary  Patient Details  Name: Victor Diaz MRN: [id number] Date of Birth: 1945-06-20  Today's Date: 09/14/2015 PT Individual Time: 1102-1200 and 1302-1430 PT Individual Time Calculation (min): 58 min and 88 min   Patient has met 8 of 10 long term goals due to improved activity tolerance, improved balance, increased strength and improved awareness.  Patient to discharge at an ambulatory level Modified Independent.   Patient does not require care partner as pt is discharging home at [facility] I ambulatory level.  Reasons goals not met: Pt demonstrates reduced awareness & decreased ability to implement sternal precautions into activities.  Recommendation:  Patient will benefit from ongoing skilled PT services in home health setting to continue to advance safe functional mobility, address ongoing impairments in decreased balance, decreased endurance, progress to ambulation without AD, increase safety awareness and maintaining sternal precautions and minimize fall risk.  Equipment: rollator  Reasons for discharge: treatment goals met  Patient/family agrees with progress made and goals achieved: Yes  Skilled PT treatment: Treatment 1: Pt received in recliner & agreeable to PT. Pt able to demonstrate car transfers with mod I with rollator but required supervision for all sit<>stand transfers as pt required cuing for locking rollator brakes & for proper hand placement. Pt able to recall sternal precautions but still unable to implement them into activity 100% of the time. Pt negotiated 12 steps with 1 rail & 4 steps without rails all with Mod I & reciprocal gait, as well as bed mobility with mod I. Pt completed Berg balance test & improved score to 48/56; educated pt on score interpretation & continued need to use rollator during ambulation. Patient demonstrates increased fall risk as noted by score of 48/56 on Berg Balance Scale.  (<36= high risk for falls, close to 100%;  37-45 significant >80%; 46-51 moderate >50%; 52-55 lower >25%). Gait training gym>room x 100 ft with rollator & mod I. At end of session pt left in recliner set up with lunch tray & all needs within reach.   Treatment 2: Pt received in bed & agreeable to PT, denying c/o pain. Pt ambulated off of unit down to gift shop & outside >1000 ft with multiple seated rest breaks as needed. Pt able to demonstrate proper use of rollator when needing to sit down in open space. Pt also able to negotiate over ramps & uneven brick surface with Mod I. Pt completed sit<>stand transfers from various surfaces (bench, soft chair) & required supervision to scoot out to edge of seat without use of BUE's, increase anterior weight shift and not reach for rollator before completely upright. Pt completed sit>stand from low soft chair after multiple attempts with supervision. Returned to unit & utilized Wii bowling while standing on compliant surface to address pt's balance & endurance but pt only able to tolerate standing 2 minutes 15 seconds before c/o 7-8/10 back pain.  Pt reports back pain is chronic & to alleviate pain he sits down. Pt completed seated BLE long arc quads with 2# wts on BLE but with continued c/o back pain when sitting without back support. Pt declined further participation in Wii activity, requesting to go back to room but agreeable to using nu-step. Utilized nu-step level 4 x 12 minutes without BUE for cardiovascular endurance training. Pt required maximum encouragement to continue to participate in activity & reported 13-15 BORG RPE scale. Gait training back to room & pt left in recliner with all needs within reach.   During session, educated  pt on need to ambulate with rollator at all times when at home, as well as technique for carrying objects on rollator seat instead of carrying object with 1 hand & pt voiced understanding.  PT Discharge Precautions/Restrictions Precautions Precautions: Sternal Precaution  Comments: Sternal precautions Restrictions Weight Bearing Restrictions: No  Pain Pain Assessment Pain Assessment: No/denies pain  Vision/Perception    Pt wears glasses at all times & reports no changes from baseline.  Cognition Overall Cognitive Status: Within Functional Limits for tasks assessed Arousal/Alertness: Awake/alert Orientation Level: Oriented to person;Oriented to place;Oriented to time;Oriented to situation Attention: Alternating Memory: Impaired Memory Impairment: Decreased short term memory Awareness: Appears intact Problem Solving: Appears intact Safety/Judgment: Impaired (forgets to implement sternal precautions into activity)   Sensation Sensation Light Touch: Appears Intact (BLE) Proprioception: Appears Intact (BLE) Coordination Coordination and Movement Description:  (Rapid alternating movements equal BUE) Finger Nose Finger Test:  (10 seconds each BUE) Heel Shin Test:  (6 seconds BLE)  Motor  Motor Motor: Within Functional Limits Motor - Discharge Observations: improved overall weakness and decr edema in lower extremetries   Mobility Bed Mobility Bed Mobility: Rolling Right;Rolling Left;Supine to Sit;Sit to Supine (regular bed) Rolling Right: 6: Modified independent (Device/Increase time) Rolling Left: 6: Modified independent (Device/Increase time) Supine to Sit: 6: Modified independent (Device/Increase time) Sit to Supine: 6: Modified independent (Device/Increase time) Transfers Transfers: Yes Sit to Stand: 5: Supervision (cuing to maintain sternal precautions & locking rollator) Stand to Sit: 5: Supervision (cuing to maintain sternal precautions & locking rollator)  Locomotion  Ambulation Ambulation: Yes Ambulation/Gait Assistance: 6: Modified independent (Device/Increase time) Ambulation Distance (Feet): 200 Feet Assistive device: Rollator Gait Gait: Yes Gait Pattern: Decreased step length - left;Decreased step length -  right;Shuffle Stairs / Additional Locomotion Stairs: Yes Stairs Assistance: 6: Modified independent (Device/Increase time) (12 steps with 1 rail & 4 steps without rails with Mod I) Stair Management Technique: One rail Right Number of Stairs: 12 Height of Stairs: 6 (inches) Ramp: 6: Modified independent (Device) Wheelchair Mobility Wheelchair Mobility: No   Trunk/Postural Assessment  Cervical Assessment Cervical Assessment: Within Functional Limits Thoracic Assessment Thoracic Assessment:  (flexed forward) Lumbar Assessment Lumbar Assessment: Within Functional Limits Postural Control Postural Control: Within Functional Limits   Balance Balance Balance Assessed: Yes Standardized Balance Assessment Standardized Balance Assessment: Berg Balance Test;Timed Up and Go Test Berg Balance Test Sit to Stand: Able to stand without using hands and stabilize independently Standing Unsupported: Able to stand safely 2 minutes Sitting with Back Unsupported but Feet Supported on Floor or Stool: Able to sit safely and securely 2 minutes Stand to Sit: Sits safely with minimal use of hands Transfers: Able to transfer safely, minor use of hands Standing Unsupported with Eyes Closed: Able to stand 10 seconds safely Standing Ubsupported with Feet Together: Able to place feet together independently and stand 1 minute safely From Standing, Reach Forward with Outstretched Arm: Can reach forward >12 cm safely (5") From Standing Position, Pick up Object from Floor: Able to pick up shoe safely and easily From Standing Position, Turn to Look Behind Over each Shoulder: Turn sideways only but maintains balance Turn 360 Degrees: Able to turn 360 degrees safely but slowly (8 seconds to L, 7 seconds to R) Standing Unsupported, Alternately Place Feet on Step/Stool: Able to stand independently and safely and complete 8 steps in 20 seconds (13 seconds) Standing Unsupported, One Foot in Front: Able to place foot  tandem independently and hold 30 seconds Standing on One Leg: Tries to  lift leg/unable to hold 3 seconds but remains standing independently Total Score: 48 Timed Up and Go Test TUG: Normal TUG Normal TUG (seconds):  (26 seconds with rollator, 21 seconds without AD)  5x sit-to-stand without BUE support: 17 seconds   Extremity Assessment  RLE Assessment RLE Assessment: Exceptions to [facility] RLE AROM (degrees) Overall AROM Right Lower Extremity: Within functional limits for tasks assessed RLE Strength Right Hip Flexion: 4-/5 Right Knee Flexion: 4/5 Right Knee Extension: 4-/5 Right Ankle Dorsiflexion: 3+/5 LLE Assessment LLE Assessment: Exceptions to WFL LLE AROM (degrees) Overall AROM Left Lower Extremity: Within functional limits for tasks assessed LLE Strength Left Hip Flexion: 4-/5 Left Knee Flexion: 4+/5 Left Knee Extension: 4/5 Left Ankle Dorsiflexion: 3+/5   See Function Navigator for Current Functional Status.  [name] 09/14/2015, 11:11 AM

## 2015-09-14 NOTE — Plan of Care (Signed)
Problem: RH Balance Goal: LTG Patient will maintain dynamic standing balance (PT) LTG: Patient will maintain dynamic standing balance with assistance during mobility activities (PT)  Outcome: Completed/Met Date Met:  09/14/15 Without AD during functional activity  Problem: RH Bed to Chair Transfers Goal: LTG Patient will perform bed/chair transfers w/assist (PT) LTG: Patient will perform bed/chair transfers with assistance, with/without cues (PT).  Outcome: Not Met (add Reason) Unable to maintain sternal precautions during sit<>stand  Problem: RH Car Transfers Goal: LTG Patient will perform car transfers with assist (PT) LTG: Patient will perform car transfers with assistance (PT).  Outcome: Completed/Met Date Met:  09/14/15 With rollator  Problem: RH Furniture Transfers Goal: LTG Patient will perform furniture transfers w/assist (OT/PT LTG: Patient will perform furniture transfers with assistance (OT/PT).  Outcome: Not Met (add Reason) Unable to maintain sternal precautions without cuing  Problem: RH Ambulation Goal: LTG Patient will ambulate in controlled environment (PT) LTG: Patient will ambulate in a controlled environment, # of feet with assistance (PT).  Outcome: Completed/Met Date Met:  09/14/15 150 ft with rollator Goal: LTG Patient will ambulate in home environment (PT) LTG: Patient will ambulate in home environment, # of feet with assistance (PT).  Outcome: Completed/Met Date Met:  09/14/15 50 ft with rollator Goal: LTG Patient will ambulate in community environment (PT) LTG: Patient will ambulate in community environment, # of feet with assistance (PT).  Outcome: Completed/Met Date Met:  09/14/15 150 ft with rollator  Problem: RH Stairs Goal: LTG Patient will ambulate up and down stairs w/assist (PT) LTG: Patient will ambulate up and down # of stairs with assistance (PT)  Outcome: Completed/Met Date Met:  09/14/15 12 steps with 1 rail  Problem: RH Other  (Specify) Goal: RH LTG Other (Specify)1 Outcome: Completed/Met Date Met:  09/14/15 4 steps without rails with mod I

## 2015-09-14 NOTE — Progress Notes (Signed)
[place] PHYSICAL MEDICINE & REHABILITATION     PROGRESS NOTE  Subjective/Complaints:  Patient sitting up in chair this AM, eating breakfast.   He feels his BMs are frequent.  He asks the same questions as yesterday about going home and if he would know in advance.   ROS: Denies CP, SOB, nausea, vomiting.  Objective: Vital Signs: Blood pressure 136/62, pulse 94, temperature 98.3 F (36.8 C), temperature source Oral, resp. rate 18, height 5\' 6"  (1.676 m), weight 87.59 kg (193 lb 1.6 oz), SpO2 94 %. No results found.  Recent Labs  09/13/15 1007  WBC 5.9  HGB 9.7*  HCT 30.6*  PLT 278    Recent Labs  09/13/15 1007  NA 128*  K 4.0  CL 98*  GLUCOSE 110*  BUN 17  CREATININE 1.43*  CALCIUM 8.7*   CBG (last 3)  No results for input(s): GLUCAP in the last 72 hours.  Wt Readings from Last 3 Encounters:  09/14/15 87.59 kg (193 lb 1.6 oz)  09/09/15 99.7 kg (219 lb 12.8 oz)  08/13/15 92.987 kg (205 lb)    Physical Exam:  BP 136/62 mmHg  Pulse 94  Temp(Src) 98.3 F (36.8 C) (Oral)  Resp 18  Ht 5\' 6"  (1.676 m)  Wt 87.59 kg (193 lb 1.6 oz)  BMI 31.18 kg/m2  SpO2 94% Constitutional: He appears well-developed and well-nourished. NAD. HENT: Normocephalic and atraumatic.  Eyes: Conjunctivae and EOM are normal.  Neck: Normal range of motion. Neck supple. No thyromegaly present.  Cardiovascular: Irregularly irregular  Respiratory: Effort normal and breath sounds normal. No respiratory distress.  GI: Soft. Bowel sounds are normal. He exhibits no distension. Obese  Musculoskeletal: He exhibits edema. He exhibits no tenderness. Neurological: He is alert and oriented.  Follows simple commands Motor: 4+/5 bilateral deltoids, biceps, triceps, grip Bilateral lower extremities: 4+/5 in the hip flexors and knee extensors, 4+/5 ankle dorsiflexors and plantar flexors Skin: Skin is warm and dry. Abd incision with mild serous drainage centrally, otherwise c/d/i.  Midline chest  incision clean and dry  Psychiatric: He has a normal mood and affect. His behavior is normal   Assessment/Plan: 1. Functional deficits secondary to deconditioning which require 3+ hours per day of interdisciplinary therapy in a comprehensive inpatient rehab setting. Physiatrist is providing close team supervision and 24 hour management of active medical problems listed below. Physiatrist and rehab team continue to assess barriers to discharge/monitor patient progress toward functional and medical goals.  Function:  Bathing Bathing position   Position: Shower  Bathing parts Body parts bathed by patient: Right arm, Left arm, Chest, Abdomen, Front perineal area, Buttocks, Right upper leg, Left upper leg, Right lower leg, Left lower leg Body parts bathed by helper: Back  Bathing assist Assist Level: Supervision or verbal cues      Upper Body Dressing/Undressing Upper body dressing   What is the patient wearing?: Pull over shirt/dress     Pull over shirt/dress - Perfomed by patient: Thread/unthread right sleeve, Thread/unthread left sleeve, Put head through opening, Pull shirt over trunk          Upper body assist Assist Level: Set up   Set up : To obtain clothing/put away  Lower Body Dressing/Undressing Lower body dressing   What is the patient wearing?: Pants, Socks, Shoes Underwear - Performed by patient: Thread/unthread right underwear leg, Thread/unthread left underwear leg, Pull underwear up/down   Pants- Performed by patient: Thread/unthread right pants leg, Thread/unthread left pants leg, Pull pants up/down  Non-skid slipper socks- Performed by patient: Don/doff right sock, Don/doff left sock   Socks - Performed by patient: Don/doff right sock, Don/doff left sock Socks - Performed by helper: Don/doff right sock, Don/doff left sock Shoes - Performed by patient: Don/doff right shoe, Don/doff left shoe Shoes - Performed by helper: Fasten right, Fasten left           Lower body assist Assist for lower body dressing: Supervision or verbal cues      Toileting Toileting   Toileting steps completed by patient: Adjust clothing prior to toileting, Performs perineal hygiene, Adjust clothing after toileting Toileting steps completed by helper: Adjust clothing prior to toileting, Adjust clothing after toileting [place]: Grab bar or rail  Toileting assist Assist level: Touching or steadying assistance (Pt.75%)   Transfers Chair/bed transfer   Chair/bed transfer method: Ambulatory Chair/bed transfer assist level: Supervision or verbal cues Chair/bed transfer assistive device: Armrests, Medical sales representative     Max distance: 100 ft Assist level: Supervision or verbal cues   Wheelchair Wheelchair activity did not occur: N/A        Cognition Comprehension Comprehension assist level: Follows complex conversation/direction with no assist, Follows basic conversation/direction with no assist  Expression Expression assist level: Expresses basic needs/ideas: With no assist  Social Interaction Social Interaction assist level: Interacts appropriately with others with medication or extra time (anti-anxiety, antidepressant). (ativan)  Problem Solving Problem solving assist level: Solves basic problems with no assist  Memory Memory assist level: Complete Independence: No helper    Medical Problem List and Plan: 1. 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 DVT Prophylaxis/Anticoagulation: Subcutaneous Lovenox. Monitor platelet counts and any signs of bleeding 3. Pain Management: Ultram as needed 4. Mood: Ativan 0.5 mg daily at bedtime 5. Neuropsych: This patient is capable of making decisions on his own behalf. 6. Skin/Wound Care: Routine skin checks 7.  Fluids/Electrolytes/Nutrition: Routine I&O's, eating 100% meals 8. Acute blood loss anemia.   Hb 9.7 on 5/29, stable   Will cont to monitor 9. Hypertension/atrial fibrillation. Amiodarone 200 mg twice a day, lisinopril 2.5 mg daily, Toprol 15 mg daily.   Monitor with increased mobility  Will reduce lasix 40 mg twice a day to daily and monitor renal function 10. History of syncope with subclavian artery stenosis status post stenting. Check orthostatic blood pressures 11. History of alcohol abuse. Counseling 12. Hyperlipidemia. No Zocor while on amiodarone. Patient has allergy to Lipitor 13. Hyponatremia: asymptomatic  Na 128 on 5/29, stable  Will cont to monitor 14. AKI  Likely secondary to meds, lasix decreased to daily on 5/30  LOS (Days) 5 A FACE TO FACE EVALUATION WAS PERFORMED  Shemeika Starzyk Lorie Phenix 09/14/2015 7:45 AM

## 2015-09-14 NOTE — Patient Care Conference (Signed)
Inpatient RehabilitationTeam Conference and Plan of Care Update Date: 09/15/2015   Time: 2:25 pm    [name]      Medical Record Number: [id number]  Date of Birth: March 04, 1946 Sex: Male         Room/Bed: 4W15C/4W15C-01 Payor Info: Payor: MEDICARE / Plan: MEDICARE PART A AND B / Product Type: *No Product type* /    Admitting Diagnosis: DEBILITY  Admit Date/Time:  09/09/2015  3:38 PM Admission Comments: No comment available   Primary Diagnosis:  <principal problem not specified> Principal Problem: <principal problem not specified>  Patient Active Problem List   Diagnosis Date Noted  . AKI (acute kidney injury) ([place])   . Debilitated 09/09/2015  . Pressure ulcer 09/07/2015  . Acute blood loss anemia   . Acute systolic congestive heart failure ([place])   . Coronary artery disease involving native coronary artery of native heart without angina pectoris   . Essential hypertension   . Obesity   . Paroxysmal atrial fibrillation ([facility])   . Subclavian artery stenosis ([place])   . Tachycardia   . Tachypnea   . 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 Unstable angina ([place]) 08/19/2015  . Abnormal myocardial perfusion study   . Hyperlipidemia 01/30/2015  . Faintness   . 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 Thrombocytopenia ([place]) 10/16/2014  . Atrial flutter ([place]) 10/16/2014  . Elevated LFTs 10/16/2014  . Subclavian steal syndrome 10/16/2014  . Subclavian artery stenosis, left 10/16/2014  . Diastolic dysfunction [id number]  . Carotid stenosis 10/16/2014  . Bruit of left carotid artery   . Dehydration   . Syncope 10/14/2014  . ETOH abuse 10/14/2014  . Hyponatremia 10/14/2014  . Metabolic acidosis [id number]  . Chest pain 10/14/2014  . Laceration of head 10/14/2014  . Malnutrition ([place]) 10/14/2014  . Forehead laceration   . Adjustment disorder with mixed anxiety and depressed mood 03/17/2013  . Aortic stenosis 08/03/2011  . Cerebrovascular disease 08/03/2011  . ASCVD  (arteriosclerotic cardiovascular disease) 08/03/2011  . Bladder neck obstruction 08/18/2010  . PLEURAL EFFUSION, RIGHT 04/07/2010  . GERD 05/14/2009  . HLD (hyperlipidemia) 12/11/2006  . GOUT 12/11/2006  . DEPENDENCE, ALCOHOL NEC/NOS, UNSPECIFIED 12/11/2006  . ALLERGIC RHINITIS 12/11/2006  . LIVER FUNCTION TESTS, ABNORMAL 12/11/2006    Expected Discharge Date: Expected Discharge Date: 09/15/15  Team Members Present: Physician leading conference: Dr. 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 Pt having loose stools with stool softners. LBM 09/14/15.   Manage bowel and bladder with min assist   Hold stool softners. offer toileting q 2 hours and prn    Swallow/Nutrition/ Hydration             ADL's   mod I overall with and without AD  mod I with and  without AD  d/c planning, activity tolerance, endurance   Mobility   supervision for transfers to maintain sternal precautions, mod i for ambulation with rollator, mod i for stair negotiation, mod I for car transfers  Mod I ambulatory level  goals, Mod I transfers & standing balance  endurance & balance training, education & safety awareness with sternal precautions with activity, gait trianing with rollator   Communication             Safety/Cognition/ Behavioral Observations            Pain   Occasional complaints of pain to abdomen. Ultram 50 mg prn Q6.  <4   Assess pain q shift and prn. Medicate as needed.    Skin   Abdominal  incisions with some drainage. Steri strips intact. Telfa and tegaderm dx changed prn.   No new skin breakdown or infection   Teach wound care. Change dx as needed. Monitor and assess skin q shift and prn.       *See Care Plan and progress notes for long and short-term goals.  Barriers to Discharge: AKI, ABLA, hyponatremia    Possible Resolutions to Barriers:  follow labs    Discharge Planning/Teaching Needs:    Home to son's apartment so someone is there with him, then transition back to his home. He will reach mod/i level. Granddaughter aware pt unable to care for her young children at discharge.     Team Discussion:  Reached mod/i level goals. Plan to go to son's for a short time until feels comfortable to return to his own home. Aware of resources for substance abuse issues. Medically stable for discharge today-HHRN for wound management  Revisions to Treatment Plan:  Ready for DC    Continued Need for Acute Rehabilitation Level of Care: The patient requires daily medical management by a physician with specialized training in physical medicine and rehabilitation for the following conditions: Daily direction of a multidisciplinary physical rehabilitation program to ensure safe treatment while eliciting the highest outcome that is of practical value to the patient.: Yes Daily medical management of patient stability for increased activity during participation in an intensive rehabilitation regime.: Yes Daily analysis of laboratory values and/or radiology reports with any subsequent need for medication adjustment of medical intervention for : Cardiac problems;Post surgical problems;Renal problems;Other  Kanya Potteiger, [place] Rhyme 09/15/2015, 3:13 PM

## 2015-09-14 NOTE — Consult Note (Signed)
INITIAL DIAGNOSTIC EVALUATION - CONFIDENTIAL [facility]   MEDICAL NECESSITY:  Curlee Rozzi was seen on the [facility] for an initial diagnostic evaluation to assist in treatment planning.   Records indicate that Mr. Girten is a "70 y.o. right handed male with history of obesity, hypertension, hyperlipidemia, PAF and poor anticoagulation candidate due to alcohol abuse and history of syncope, subclavian artery stenosis status post stenting. Patient lives with son and grandchildren. Independent prior to admission. Limited assistance of family. One level home with basement. Presented 08/19/2015 with episodes of dizziness and presyncope, chest discomfort and increasing dyspnea with exertion. Recent cardiac stress test positive for ischemia and subsequent cardiac catheterization demonstrated severe multi-vessel coronary artery disease. Echocardiogram with ejection fraction of 45% diffuse hypokinesis. Underwent CABG 4 08/20/2015 per [name] trigt. Hospital course acute blood loss anemia 8.0-8.3 and monitored. Currently maintained on aspirin 81 mg daily after CABG. Subcutaneous Lovenox for DVT prophylaxis. Developed atrial fibrillation 08/22/2015 maintained on intravenous amiodarone and transitioned to Lopressor secondary to nausea from amiodarone."  During today's visit, Mr. Mcgarey denied experiencing any cognitive difficulties for any reason. From an emotional standpoint, he described himself to be somewhat nervous today for no apparent reason. He mentioned feeling as if he cannot get comfortable. He denied depression stating that he feels "pretty good" in general. He has a significant history of alcoholism for >50 years. Most recently he acknowledged drinking approximately 12+ beers per day up until he underwent CABG. In fact, he was receiving ~4 beers per day in the hospital to assist with withdrawal symptoms. He has had no alcohol since his surgery  and he denied any chronic withdrawal symptoms. In the past he has attempted AA and rehab on one occasion. His sobriety unfortunately never lasted long. He was prescribed benzodiazepines in the past that he found somewhat helpful. He is receiving a nighttime dose here that seems to be helpful to some degree. He has never formally participated in counseling. Suicidal/homicidal ideation, plan or intent was denied. No manic or hypomanic episodes were reported. The patient denied ever experiencing any auditory/visual hallucinations. No major behavioral or personality changes were endorsed.   Mr. Bastone feels that he is making progress in therapy and he has been satisfied with the rehab staff. He presently lives with his son, his granddaughter, and great grandchildren. He was caring for his great grandchildren but he does not feel that he can do this any longer.   PROCEDURES: [1 unit 90791] Diagnostic clinical interview  Review of available records   IMPRESSION: Overall, Mr. Seitter denied suffering any cognitive deficits and no overt issues were observed. Emotionally, he described mild physical symptoms of anxiety that are currently being treated via a nighttime benzodiazepine. Time was spent discussing sobriety and recommendations for how to stay sober upon discharge. I suggested participation in [place] and attending 90 meetings in 90 days, obtaining a sponsor, and to consider participating in individual counseling. Given his complex medical history, a psychiatry consult may also be valuable. One consideration is transportation as Mr. Kinzel mentioned that this will be his largest barrier to following through with these recommendations. I ask that social work please research whether transportation is available for him to receive these services. Since the patient is likely to discharge this week I do not plan on seeing him again during this admission. However, he was encouraged to call upon [place] if necessary.      Rutha Bouchard, Psy.D., [place]  Board-certified Clinical Neuropsychologist

## 2015-09-14 NOTE — Progress Notes (Signed)
Occupational Therapy Discharge Summary  Patient Details  Name: Victor Diaz MRN: [id number] Date of Birth: 05-17-1945  Today's Date: 09/14/2015 OT Individual Time: 0800-0910 OT Individual Time Calculation (min): 70 min   GRAD DAY! Self care retraining at shower level. Pt able to perform basic and family ADL routine mod I without AD. Pt sounds wet vocally today and RN alerted. D/c planning with focus on f/u therapies, equipment and home safety recommendations. Pt with abdominal wound from incision that is being dressed. Discussed with SW about HHRN needing to come to assess wound at home. Pt participated in discussion with SW about d/c after ambulating to their office. PT with c/o fatigue today and some "old back discomfort." Pt used Rollator to ambulation to decr discomfort. Pt performed Nustep on level 5 resistance for 10 min with intermittent rest breaks. Also performed simulated 3 stairs with one rail with extra time for d/c to son's house.    Patient has met 8 of 8 long term goals due to improved activity tolerance, improved balance, postural control and ability to compensate for deficits.  Patient to discharge at overall Modified Independent level.  Patient's care partner is independent to provide the necessary physical assistance at discharge.    Reasons goals not met: n/a  Recommendation:  Patient will benefit from ongoing skilled OT services in home health setting to continue to advance functional skills in the area of BADL and iADL.  Equipment: Rollator, tub bench  Reasons for discharge: treatment goals met and discharge from hospital  Patient/family agrees with progress made and goals achieved: Yes  OT Discharge Precautions/Restrictions  Precautions Precautions: Fall;Sternal Restrictions Weight Bearing Restrictions: No General   Vital Signs Oxygen Therapy SpO2: 97 % O2 Device: Not Delivered Pain Pain Assessment Pain Assessment: No/denies pain ADL ADL ADL  Comments: see functional navigator Vision/Perception  Vision- History Baseline Vision/History: Wears glasses Wears Glasses: At all times Patient Visual Report: No change from baseline Vision- Assessment Vision Assessment?: No apparent visual deficits  Cognition Overall Cognitive Status: Within Functional Limits for tasks assessed Arousal/Alertness: Awake/alert Orientation Level: Oriented X4 Attention: Alternating Memory:  (short term new learning still require cues) Awareness: Appears intact Problem Solving: Appears intact Safety/Judgment: Appears intact Sensation Sensation Light Touch: Appears Intact Stereognosis: Appears Intact Hot/Cold: Appears Intact Coordination Gross Motor Movements are Fluid and Coordinated: Yes Fine Motor Movements are Fluid and Coordinated: Yes Coordination and Movement Description: decr edema; still shuffles feet at times Motor  Motor Motor: Within Functional Limits Motor - Discharge Observations: improved overall weakness and decr edema in lower extremetries Mobility  Transfers Transfers: Sit to Stand;Stand to Sit Sit to Stand: 6: Modified independent (Device/Increase time) Stand to Sit: 6: Modified independent (Device/Increase time)  Trunk/Postural Assessment  Cervical Assessment Cervical Assessment: Within Functional Limits Thoracic Assessment Thoracic Assessment:  (flexed forward) Lumbar Assessment Lumbar Assessment: Within Functional Limits Postural Control Postural Control: Within Functional Limits  Balance Static Standing Balance Static Standing - Balance Support: During functional activity;No upper extremity supported Static Standing - Level of Assistance: 6: Modified independent (Device/Increase time) Extremity/Trunk Assessment RUE Assessment RUE Assessment: Within Functional Limits LUE Assessment LUE Assessment: Within Functional Limits   See Function Navigator for Current Functional Status.  Willeen Cass  [facility] 09/14/2015, 9:29 AM

## 2015-09-14 NOTE — Discharge Instructions (Signed)
Inpatient Rehab Discharge Instructions  RAFEEQ BACH Discharge date and time: No discharge date for patient encounter.   Activities/Precautions/ Functional Status: Activity: activity as tolerated Diet: diabetic diet Wound Care: keep wound clean and dry Functional status:  ___ No restrictions     ___ Walk up steps independently ___ 24/7 supervision/assistance   ___ Walk up steps with assistance ___ Intermittent supervision/assistance  ___ Bathe/dress independently ___ Walk with walker     _x__ Bathe/dress with assistance ___ Walk Independently    ___ Shower independently ___ Walk with assistance    ___ Shower with assistance ___ No alcohol     ___ Return to work/school ________  Special Instructions: Telfa dressing covered with Tegaderm to abdominal wound daily as needed  NO ALCOHOL  STERNAL PRECAUTIONS    COMMUNITY REFERRALS UPON DISCHARGE:    Home Health:   PT & RN  [place]   Date of last service:09/15/2015   Medical Equipment/Items Ordered:ROLLATOR [place]   [phone]   GENERAL COMMUNITY RESOURCES FOR PATIENT/FAMILY: Support Groups:AA SUPPORT GROUP INFORMATION GIVEN TO PATIENT   My questions have been answered and I understand these instructions. I will adhere to these goals and the provided educational materials after my discharge from the hospital.  Patient/Caregiver Signature _______________________________ Date __________  Clinician Signature _______________________________________ Date __________  Please bring this form and your medication list with you to all your follow-up doctor's appointments.

## 2015-09-14 NOTE — Progress Notes (Signed)
Social Work Patient ID: Victor Diaz, male   DOB: 1945-05-05, 70 y.o.   MRN: [id number] Team feels pt will be ready for discharge tomorrow, checked with MD he feels pt is medically stable for discharge tomorrow. Will begin with home health therapies due to pt's need for a HHRN and Then transition to OP once wound more healed. He is agreeable and has no preference. Discussed equipment needs rollator rolling walker and tub bench, aware of the private pay for tub bench. Work toward discharge tomorrow.

## 2015-09-15 DIAGNOSIS — I10 Essential (primary) hypertension: Secondary | ICD-10-CM

## 2015-09-15 LAB — BASIC METABOLIC PANEL
ANION GAP: 7 (ref 5–15)
BUN: 14 mg/dL (ref 6–20)
CALCIUM: 8.8 mg/dL — AB (ref 8.9–10.3)
CO2: 23 mmol/L (ref 22–32)
Chloride: 97 mmol/L — ABNORMAL LOW (ref 101–111)
Creatinine, Ser: 1.25 mg/dL — ABNORMAL HIGH (ref 0.61–1.24)
GFR, EST NON AFRICAN AMERICAN: 56 mL/min — AB (ref >60)
Glucose, Bld: 139 mg/dL — ABNORMAL HIGH (ref 65–99)
POTASSIUM: 4 mmol/L (ref 3.5–5.1)
SODIUM: 127 mmol/L — AB (ref 135–145)

## 2015-09-15 MED ORDER — FUROSEMIDE 40 MG PO TABS: 40.0000 mg | ORAL_TABLET | Freq: Every day | ORAL | Status: DC

## 2015-09-15 MED ORDER — LORAZEPAM 0.5 MG PO TABS: 0.5000 mg | ORAL_TABLET | Freq: Every day | ORAL | Status: DC

## 2015-09-15 MED ORDER — POTASSIUM CHLORIDE CRYS ER 20 MEQ PO TBCR: 20.0000 meq | EXTENDED_RELEASE_TABLET | Freq: Two times a day (BID) | ORAL | Status: DC

## 2015-09-15 MED ORDER — METOCLOPRAMIDE HCL 10 MG PO TABS: 10.0000 mg | ORAL_TABLET | Freq: Four times a day (QID) | ORAL | Status: DC

## 2015-09-15 MED ORDER — PANTOPRAZOLE SODIUM 40 MG PO TBEC: 40.0000 mg | DELAYED_RELEASE_TABLET | Freq: Every day | ORAL | Status: DC

## 2015-09-15 MED ORDER — METOPROLOL SUCCINATE ER 25 MG PO TB24: ORAL_TABLET | ORAL | Status: DC

## 2015-09-15 MED ORDER — TRAMADOL HCL 50 MG PO TABS: 50.0000 mg | ORAL_TABLET | Freq: Four times a day (QID) | ORAL | Status: DC | PRN

## 2015-09-15 MED ORDER — AMIODARONE HCL 200 MG PO TABS: 200.0000 mg | ORAL_TABLET | Freq: Two times a day (BID) | ORAL | Status: DC

## 2015-09-15 MED ORDER — DOCUSATE SODIUM 100 MG PO CAPS: 200.0000 mg | ORAL_CAPSULE | Freq: Every day | ORAL | Status: DC

## 2015-09-15 MED ORDER — LISINOPRIL 2.5 MG PO TABS: 2.5000 mg | ORAL_TABLET | Freq: Every day | ORAL | Status: DC

## 2015-09-15 NOTE — Progress Notes (Signed)
[facility] PHYSICAL MEDICINE & REHABILITATION     PROGRESS NOTE  Subjective/Complaints:  Patient seen sitting up in his chair this morning. He has questions about antianxiety medications. He also has questions about taking home medications.   ROS: Denies CP, SOB, nausea, vomiting, constipation.  Objective: Vital Signs: Blood pressure 137/54, pulse 86, temperature 98.2 F (36.8 C), temperature source Oral, resp. rate 19, height 5\' 6"  (1.676 m), weight 87.091 kg (192 lb), SpO2 94 %. No results found.  Recent Labs  09/13/15 1007  WBC 5.9  HGB 9.7*  HCT 30.6*  PLT 278    Recent Labs  09/13/15 1007  NA 128*  K 4.0  CL 98*  GLUCOSE 110*  BUN 17  CREATININE 1.43*  CALCIUM 8.7*   CBG (last 3)  No results for input(s): GLUCAP in the last 72 hours.  Wt Readings from Last 3 Encounters:  09/15/15 87.091 kg (192 lb)  09/09/15 99.7 kg (219 lb 12.8 oz)  08/13/15 92.987 kg (205 lb)    Physical Exam:  BP 137/54 mmHg  Pulse 86  Temp(Src) 98.2 F (36.8 C) (Oral)  Resp 19  Ht 5\' 6"  (1.676 m)  Wt 87.091 kg (192 lb)  BMI 31.00 kg/m2  SpO2 94% Constitutional: He appears well-developed and well-nourished. NAD. HENT: Normocephalic and atraumatic.  Eyes: Conjunctivae and EOM are normal.  Neck: Normal range of motion. Neck supple. No thyromegaly present.  Cardiovascular: Irregularly irregular  Respiratory: Effort normal and breath sounds normal. No respiratory distress.  GI: Soft. Bowel sounds are normal. He exhibits no distension. Obese  Musculoskeletal: He exhibits edema. He exhibits no tenderness. Neurological: He is alert and oriented.  Follows simple commands Motor: 5/5 bilateral deltoids, biceps, triceps, grip Bilateral lower extremities: 5/5 in the hip flexors and knee extensors, 5/5 ankle dorsiflexors and plantar flexors Skin: Skin is warm and dry. Abd incision with mild serous drainage centrally, otherwise c/d/i.  Midline chest incision clean and dry   Psychiatric: He has a normal mood and affect. His behavior is normal   Assessment/Plan: 1. Functional deficits secondary to deconditioning which require 3+ hours per day of interdisciplinary therapy in a comprehensive inpatient rehab setting. Physiatrist is providing close team supervision and 24 hour management of active medical problems listed below. Physiatrist and rehab team continue to assess barriers to discharge/monitor patient progress toward functional and medical goals.  Function:  Bathing Bathing position   Position: Shower  Bathing parts Body parts bathed by patient: Right arm, Left arm, Chest, Abdomen, Front perineal area, Buttocks, Right upper leg, Left upper leg, Right lower leg, Left lower leg Body parts bathed by helper: Back  Bathing assist Assist Level: More than reasonable time      Upper Body Dressing/Undressing Upper body dressing   What is the patient wearing?: Pull over shirt/dress     Pull over shirt/dress - Perfomed by patient: Thread/unthread right sleeve, Thread/unthread left sleeve, Put head through opening, Pull shirt over trunk          Upper body assist Assist Level: No help, No cues   Set up : To obtain clothing/put away  Lower Body Dressing/Undressing Lower body dressing   What is the patient wearing?: Pants, Socks, Shoes Underwear - Performed by patient: Thread/unthread right underwear leg, Thread/unthread left underwear leg, Pull underwear up/down   Pants- Performed by patient: Thread/unthread right pants leg, Thread/unthread left pants leg, Pull pants up/down   Non-skid slipper socks- Performed by patient: Don/doff right sock, Don/doff left sock  Socks - Performed by patient: Don/doff right sock, Don/doff left sock Socks - Performed by helper: Don/doff right sock, Don/doff left sock Shoes - Performed by patient: Don/doff right shoe, Don/doff left shoe Shoes - Performed by helper: Fasten right, Fasten left          Lower body  assist Assist for lower body dressing: More than reasonable time      Toileting Toileting   Toileting steps completed by patient: Adjust clothing prior to toileting, Performs perineal hygiene, Adjust clothing after toileting Toileting steps completed by helper: Adjust clothing after toileting Toileting Assistive Devices: Grab bar or rail  Toileting assist Assist level: Supervision or verbal cues   Transfers Chair/bed transfer   Chair/bed transfer method: Ambulatory Chair/bed transfer assist level: No Help, no cues, assistive device, takes more than a reasonable amount of time Chair/bed transfer assistive device: Walker [employer] forces training and education officer)     Locomotion Ambulation     Max distance: >300 ft Assist level: No help, No cues, assistive device, takes more than a reasonable amount of time   Wheelchair Wheelchair activity did not occur: N/A        Cognition Comprehension Comprehension assist level: Follows complex conversation/direction with no assist  Expression Expression assist level: Expresses complex ideas: With no assist  Social Interaction Social Interaction assist level: Interacts appropriately with others - No medications needed.  Problem Solving Problem solving assist level: Solves complex problems: Recognizes & self-corrects  Memory Memory assist level: Complete Independence: No helper    Medical Problem List and Plan: 1. 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 DVT Prophylaxis/Anticoagulation: Subcutaneous Lovenox. Monitor platelet counts and any signs of bleeding 3. Pain Management: Ultram as needed 4. Mood: Ativan 0.5 mg daily at bedtime 5. Neuropsych: This patient is capable of making decisions on his own behalf. 6. Skin/Wound Care: Routine skin checks 7. Fluids/Electrolytes/Nutrition: Routine I&O's, eating 100% meals 8.  Acute blood loss anemia.   Hb 9.7 on 5/29, stable   Will cont to monitor 9. Hypertension/atrial fibrillation. Amiodarone 200 mg twice a day, lisinopril 2.5 mg daily, Toprol 15 mg daily.   Monitor with increased mobility  Reduced lasix 40 mg twice a day to daily and monitor renal function  Slightly labile, however overall fairly well controlled. 10. History of syncope with subclavian artery stenosis status post stenting. Check orthostatic blood pressures 11. History of alcohol abuse. Counseling 12. Hyperlipidemia. No Zocor while on amiodarone. Patient has allergy to Lipitor 13. Hyponatremia: asymptomatic  Na 128 on 5/29, stable  Labs ordered for today, May need renal follow-up  Will cont to monitor 14. AKI  Likely secondary to meds, lasix decreased to daily on 5/30  Labs ordered for today, May need renal follow-up  LOS (Days) 6 A FACE TO FACE EVALUATION WAS PERFORMED  [name] 09/15/2015 8:01 AM

## 2015-09-15 NOTE — Progress Notes (Signed)
Social Work  Discharge Note  The overall goal for the admission was met for:   Discharge location: Yes-HOME TO [place].  Length of Stay: Yes-6 DAYS  Discharge activity level: Yes-MOD/I LEVEL  Home/community participation: Yes  Services provided included: MD, RD, PT, OT, RN, CM, TR, Pharmacy, Neuropsych and SW  Financial Services: Medicare and Private Insurance: [place]  Follow-up services arranged: Home Health: [facility]-PT & RN, DME: [facility] and Patient/Family has no preference for HH/DME agencies  Comments (or additional information):PT DID WELL AND REACHED MOD/I LEVEL. PLANS TO GO TO SON'S HOME FOR A WEEK THEN RETURN TO HIS HOME. GIVEN AA INFORMATION AND GROUPS.  Patient/Family verbalized understanding of follow-up arrangements: Yes  Individual responsible for coordination of the follow-up plan: SELF & CHRIS-SON  Confirmed correct DME delivered: Elease Hashimoto 09/15/2015    Elease Hashimoto

## 2015-09-15 NOTE — Progress Notes (Signed)
Pt discharged to home with son. Discharge instructions given.  

## 2015-09-20 ENCOUNTER — Emergency Department (HOSPITAL_COMMUNITY)
Admission: EM | Admit: 2015-09-20 | Discharge: 2015-09-20 | Disposition: A | Discharge status: Home / Self Care | Payer: Medicare Other | Attending: Emergency Medicine | Admitting: Emergency Medicine

## 2015-09-20 ENCOUNTER — Encounter (HOSPITAL_COMMUNITY): Payer: Self-pay | Admitting: Emergency Medicine

## 2015-09-20 DIAGNOSIS — Z79899 Other long term (current) drug therapy: Secondary | ICD-10-CM | POA: Insufficient documentation

## 2015-09-20 DIAGNOSIS — I10 Essential (primary) hypertension: Secondary | ICD-10-CM | POA: Insufficient documentation

## 2015-09-20 DIAGNOSIS — Z87891 Personal history of nicotine dependence: Secondary | ICD-10-CM | POA: Diagnosis not present

## 2015-09-20 DIAGNOSIS — Z5189 Encounter for other specified aftercare: Secondary | ICD-10-CM

## 2015-09-20 DIAGNOSIS — Z8781 Personal history of (healed) traumatic fracture: Secondary | ICD-10-CM | POA: Insufficient documentation

## 2015-09-20 DIAGNOSIS — E871 Hypo-osmolality and hyponatremia: Secondary | ICD-10-CM | POA: Diagnosis not present

## 2015-09-20 DIAGNOSIS — M109 Gout, unspecified: Secondary | ICD-10-CM | POA: Insufficient documentation

## 2015-09-20 DIAGNOSIS — Z87828 Personal history of other (healed) physical injury and trauma: Secondary | ICD-10-CM | POA: Diagnosis not present

## 2015-09-20 DIAGNOSIS — Z4801 Encounter for change or removal of surgical wound dressing: Secondary | ICD-10-CM | POA: Diagnosis not present

## 2015-09-20 DIAGNOSIS — Z7982 Long term (current) use of aspirin: Secondary | ICD-10-CM | POA: Diagnosis not present

## 2015-09-20 DIAGNOSIS — Z951 Presence of aortocoronary bypass graft: Secondary | ICD-10-CM | POA: Insufficient documentation

## 2015-09-20 LAB — COMPREHENSIVE METABOLIC PANEL
ALT: 23 U/L (ref 17–63)
ANION GAP: 9 (ref 5–15)
AST: 31 U/L (ref 15–41)
Albumin: 3.3 g/dL — ABNORMAL LOW (ref 3.5–5.0)
Alkaline Phosphatase: 69 U/L (ref 38–126)
BUN: 9 mg/dL (ref 6–20)
CHLORIDE: 93 mmol/L — AB (ref 101–111)
CO2: 22 mmol/L (ref 22–32)
Calcium: 9.2 mg/dL (ref 8.9–10.3)
Creatinine, Ser: 1.26 mg/dL — ABNORMAL HIGH (ref 0.61–1.24)
GFR, EST NON AFRICAN AMERICAN: 56 mL/min — AB (ref >60)
Glucose, Bld: 119 mg/dL — ABNORMAL HIGH (ref 65–99)
POTASSIUM: 3.8 mmol/L (ref 3.5–5.1)
SODIUM: 124 mmol/L — AB (ref 135–145)
TOTAL PROTEIN: 7.4 g/dL (ref 6.5–8.1)
Total Bilirubin: 0.5 mg/dL (ref 0.3–1.2)

## 2015-09-20 LAB — CBC
HEMATOCRIT: 33 % — AB (ref 39.0–52.0)
HEMOGLOBIN: 10.7 g/dL — AB (ref 13.0–17.0)
MCH: 29.2 pg (ref 26.0–34.0)
MCHC: 32.4 g/dL (ref 30.0–36.0)
MCV: 89.9 fL (ref 78.0–100.0)
Platelets: 315 10*3/uL (ref 150–400)
RBC: 3.67 MIL/uL — AB (ref 4.22–5.81)
RDW: 15.3 % (ref 11.5–15.5)
WBC: 9.5 10*3/uL (ref 4.0–10.5)

## 2015-09-20 LAB — LIPASE, BLOOD: LIPASE: 127 U/L — AB (ref 11–51)

## 2015-09-20 NOTE — ED Notes (Signed)
Pt is status post bypass and colon surgery and today home health nurse states that  Part of his colon insisons looks funny and he has had some yellow drainage  Pt reports  NO  Cp  aND NO ABD PAIN

## 2015-09-20 NOTE — Consult Note (Signed)
  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 He was referred to the emergency department by his home health nurse for a wound concern. He has been eating. He is moving his bowels. He does complain of some anxiety. He is taking Ativan nightly as prescribed by the hospitalists. He has not been drinking alcohol.  Filed Vitals:   09/20/15 1634 09/20/15 1830  BP: 138/72 141/75  Pulse: 89 84  Temp: 98.2 F (36.8 C)   Resp: 16 22  WBC 9.5 Awake and alert Abdomen soft and nontender, Steri-Strips removed, there is a small bud of granulation tissue less than a centimeter in size around the midportion of the wound. There is a small separation with clean granulation tissue also at the level of the skin just below this region. No evidence of infection. There are some scattered tape blisters.  A/P Granulation tissue along wound - dry gauze dressing daily. May shower. F/U with [name] at [facility] [phone]. Regarding his anxiety, I defer to his primary care physician for further management.  Georganna Skeans, MD, MPH, FACS Trauma: [phone] General Surgery: [phone]

## 2015-09-20 NOTE — ED Provider Notes (Signed)
CSN: [id number]     Arrival date & time 09/20/15  1454 History   First MD Initiated Contact with Patient 09/20/15 1807     Chief Complaint  Patient presents with  . Abdominal Pain     (Consider location/radiation/quality/duration/timing/severity/associated sxs/prior Treatment) HPI  70 year old male with a recent CABG and partial colectomy at the beginning of May presents with possible bowel sticking out of his abdominal incision. History is taken from the son at the bedside. Patient has not been complaining of abdominal pain, nausea, vomiting, or constipation. Patient has not been feeling weak or different than normal. Only new symptoms have been feeling restless. He used to be an alcoholic and has not had alcohol in over 30 days. He has developed a new right hand tremor. When the home health nurse came to check on him and change his dressing today she noticed what she thought was possible bowel at his wound. Family states the wound has been stinking although this is been ever since surgery. Has also had chronic yellow drainage. Son has been changing the dressings daily and has noticed this pink spot over the last couple days. No fevers.  Past Medical History  Diagnosis Date  . Allergy   . Gout   . Hyperlipidemia   . Alcohol abuse   . Hemothorax on right   . Lisfranc's dislocation 10/20/2011  . Right fibular fracture 10/20/2011  . Essential hypertension   . PSVT (paroxysmal supraventricular tachycardia) ([place])   . Paroxysmal atrial flutter (HCC)     Poor anticoagulation candidate with active alcohol abuse and syncope  . Subclavian artery stenosis, left     Status post stent placement by Dr. Trula Slade 10/2014   Past Surgical History  Procedure Laterality Date  . Appendectomy    . Hernia repair    . Tonsilectomy, adenoidectomy, bilateral myringotomy and tubes    . Revision total hip arthroplasty  08/28/2008  . Partial hip arthroplasty Left 2010  . Peripheral vascular catheterization N/A  11/11/2014    Procedure: Aortic Arch Angiography/Left Subclavion Stent;  Surgeon: Serafina Mitchell, MD;  Location: [facility];  Service: Cardiovascular;  Laterality: N/A;  . Cardiac catheterization N/A 08/19/2015    Procedure: Left Heart Cath and Cors/Grafts Angiography;  Surgeon: Jettie Booze, MD;  Location: [facility];  Service: Cardiovascular;  Laterality: N/A;  . Coronary artery bypass graft N/A 08/20/2015    Procedure: CORONARY ARTERY BYPASS GRAFTING (CABG) TIMES FOUR  UTILIZING THE LEFT INTERNAL MAMMARY ARTERY AND ENDOSCOPICALLY HARVESTED RIGHT SAPHENEOUS VEINS. CLIPPING OF ATRIAL APPENDAGE;  Surgeon: Ivin Poot, MD;  Location: [facility];  Service: Open Heart Surgery;  Laterality: N/A;  . Tee without cardioversion N/A 08/20/2015    Procedure: TRANSESOPHAGEAL ECHOCARDIOGRAM (TEE);  Surgeon: Ivin Poot, MD;  Location: [facility];  Service: Open Heart Surgery;  Laterality: N/A;  . Laparotomy N/A 08/25/2015    Procedure: EXPLORATORY LAPAROTOMY;  Surgeon: Ralene Ok, MD;  Location: [facility];  Service: General;  Laterality: N/A;  . Partial colectomy N/A 08/25/2015    Procedure: ASCENDING AND TRANSVERSE COLECTOMY;  Surgeon: Ralene Ok, MD;  Location: [facility];  Service: General;  Laterality: N/A;   Family History  Problem Relation Age of Onset  . Arthritis Mother   . Cerebral aneurysm Mother   . Tuberculosis Father    Social History  Substance Use Topics  . Smoking status: Former Smoker -- 1.00 packs/day for 10 years    Types: Cigarettes    Quit  date: 08/09/1969  . Smokeless tobacco: Never Used  . Alcohol Use: 37.8 oz/week    63 Cans of beer per week     Comment: Daily    Review of Systems  Constitutional: Negative for fever.  Gastrointestinal: Negative for nausea, vomiting, abdominal pain and diarrhea.  Skin: Positive for wound.  Neurological: Positive for tremors. Negative for weakness.  All other systems reviewed and are negative.     Allergies    Atorvastatin  Home Medications   Prior to Admission medications   Medication Sig Start Date End Date Taking? Authorizing Provider  acetaminophen (TYLENOL) 325 MG tablet Take 2 tablets (650 mg total) by mouth every 6 (six) hours as needed for mild pain. 09/09/15   Donielle Liston Alba, PA-C  amiodarone (PACERONE) 200 MG tablet Take 1 tablet (200 mg total) by mouth 2 (two) times daily. 09/15/15   Lavon Paganini Angiulli, PA-C  aspirin 325 MG tablet Take 1 tablet (325 mg total) by mouth daily. 09/09/15   Donielle Liston Alba, PA-C  budesonide (PULMICORT) 0.5 MG/2ML nebulizer solution Take 2 mLs (0.5 mg total) by nebulization 2 (two) times daily. 09/09/15   Donielle Liston Alba, PA-C  cetirizine (ZYRTEC) 10 MG tablet TAKE ONE TABLET BY MOUTH ONCE DAILY AS NEEDED. Patient taking differently: Take 10 mg by mouth daily as needed for allergies. TAKE ONE TABLET BY MOUTH ONCE DAILY AS NEEDED. 07/05/15   Dorothyann Peng, NP  docusate sodium (COLACE) 100 MG capsule Take 2 capsules (200 mg total) by mouth daily. 09/15/15   Lavon Paganini Angiulli, PA-C  furosemide (LASIX) 40 MG tablet Take 1 tablet (40 mg total) by mouth daily. 09/15/15   Lavon Paganini Angiulli, PA-C  lisinopril (PRINIVIL,ZESTRIL) 2.5 MG tablet Take 1 tablet (2.5 mg total) by mouth daily. 09/15/15   Lavon Paganini Angiulli, PA-C  LORazepam (ATIVAN) 0.5 MG tablet Take 1 tablet (0.5 mg total) by mouth at bedtime. 09/15/15   Lavon Paganini Angiulli, PA-C  metoCLOPramide (REGLAN) 10 MG tablet Take 1 tablet (10 mg total) by mouth 4 (four) times daily. 09/15/15   Lavon Paganini Angiulli, PA-C  metoprolol succinate (TOPROL-XL) 25 MG 24 hr tablet **MUST SCHEDULE APPT FOR FURTHER REFILLS.** 09/15/15   Daniel J Angiulli, PA-C  Multiple Vitamin (MULTIVITAMIN WITH MINERALS) TABS tablet Take 1 tablet by mouth daily. 01/31/15   Silver Huguenin Elgergawy, MD  pantoprazole (PROTONIX) 40 MG tablet Take 1 tablet (40 mg total) by mouth daily. 09/15/15   Lavon Paganini Angiulli, PA-C  potassium chloride SA  (K-DUR,KLOR-CON) 20 MEQ tablet Take 1 tablet (20 mEq total) by mouth 2 (two) times daily. 09/15/15   Lavon Paganini Angiulli, PA-C  traMADol (ULTRAM) 50 MG tablet Take 1 tablet (50 mg total) by mouth every 6 (six) hours as needed for moderate pain. 09/15/15   Daniel J Angiulli, PA-C   BP 138/72 mmHg  Pulse 89  Temp(Src) 98.2 F (36.8 C) (Oral)  Resp 16  Wt 190 lb (86.183 kg)  SpO2 98% Physical Exam  Constitutional: He is oriented to person, place, and time. He appears well-developed and well-nourished.  HENT:  Head: Normocephalic and atraumatic.  Right Ear: External ear normal.  Left Ear: External ear normal.  Nose: Nose normal.  Eyes: Right eye exhibits no discharge. Left eye exhibits no discharge.  Neck: Neck supple.  Cardiovascular: Normal rate, regular rhythm, normal heart sounds and intact distal pulses.   Pulmonary/Chest: Effort normal and breath sounds normal.  Abdominal: Soft. He exhibits no distension. There is no tenderness.  Musculoskeletal: He exhibits no edema.  Neurological: He is alert and oriented to person, place, and time.  Skin: Skin is warm and dry.  Nursing note and vitals reviewed.   ED Course  Procedures (including critical care time) Labs Review Labs Reviewed  LIPASE, BLOOD - Abnormal; Notable for the following:    Lipase 127 (*)    All other components within normal limits  COMPREHENSIVE METABOLIC PANEL - Abnormal; Notable for the following:    Sodium 124 (*)    Chloride 93 (*)    Glucose, Bld 119 (*)    Creatinine, Ser 1.26 (*)    Albumin 3.3 (*)    GFR calc non Af Amer 56 (*)    All other components within normal limits  CBC - Abnormal; Notable for the following:    RBC 3.67 (*)    Hemoglobin 10.7 (*)    HCT 33.0 (*)    All other components within normal limits  URINALYSIS, ROUTINE W REFLEX MICROSCOPIC (NOT AT [facility])    Imaging Review No results found. I have personally reviewed and evaluated these images and lab results as part of my medical  decision-making.   EKG Interpretation None      MDM   Final diagnoses:  Encounter for wound re-check  Hyponatremia    Patient is completely asymptomatic. Vital signs are unremarkable. Discussed with Dr. Grandville Silos who examined the patient and feels that the pink area is wound granulation and nothing further to do. He has changed the patient's dressing and he can now do dry dressings. Patient does have hyponatremia with a sodium of 124. He does not appear to be symptomatically given no weakness or altered mental status. He feels at his baseline. This is only slightly decreased from 6 days ago when it was 127. Discussed importance of close follow-up with PCP for a recheck. Otherwise follow-up with surgery for recheck of wound. Discussed return precautions.    Sherwood Gambler, MD 09/20/15 1910

## 2015-09-22 ENCOUNTER — Encounter: Payer: Self-pay | Admitting: Physician Assistant

## 2015-09-22 ENCOUNTER — Ambulatory Visit (INDEPENDENT_AMBULATORY_CARE_PROVIDER_SITE_OTHER): Payer: Medicare Other | Admitting: Physician Assistant

## 2015-09-22 VITALS — BP 106/64 | HR 89 | Ht 66.0 in | Wt 191.0 lb

## 2015-09-22 DIAGNOSIS — Z951 Presence of aortocoronary bypass graft: Secondary | ICD-10-CM | POA: Diagnosis not present

## 2015-09-22 DIAGNOSIS — E785 Hyperlipidemia, unspecified: Secondary | ICD-10-CM | POA: Diagnosis not present

## 2015-09-22 DIAGNOSIS — I10 Essential (primary) hypertension: Secondary | ICD-10-CM | POA: Diagnosis not present

## 2015-09-22 DIAGNOSIS — I480 Paroxysmal atrial fibrillation: Secondary | ICD-10-CM

## 2015-09-22 DIAGNOSIS — I2510 Atherosclerotic heart disease of native coronary artery without angina pectoris: Secondary | ICD-10-CM

## 2015-09-22 MED ORDER — AMIODARONE HCL 200 MG PO TABS: 200.0000 mg | ORAL_TABLET | Freq: Every day | ORAL | Status: DC

## 2015-09-22 NOTE — Assessment & Plan Note (Signed)
Patient's allergic to atorvastatin. To be addressed on follow-up visit with Dr. Domenic Polite.

## 2015-09-22 NOTE — Patient Instructions (Signed)
Medication Instructions:  DECREASE AMIODARONE TO 200 MG DAILY   Labwork: NONE  Testing/Procedures: NONE  Follow-Up: Your physician recommends that you schedule a follow-up appointment in: 2 MONTHS WITH DR. MCDOWELL    Any Other Special Instructions Will Be Listed Below (If Applicable).  * YOU DO HAVE A REFERRAL IN FOR CARDIAC REHAB AND SHOULD BE READY TO START IN A COUPLE OF WEEKS, ONCE PHYSICAL THERAPY IS FINISHED.*    If you need a refill on your cardiac medications before your next appointment, please call your pharmacy.

## 2015-09-22 NOTE — Progress Notes (Signed)
Cardiology Office Note   Date:  09/22/2015   ID:  Victor Diaz, DOB [birth year], MRN [id number]  PCP:  Dorothyann Peng, NP  Cardiologist:  Dr. Domenic Polite   Chief Complaint: post hospital    History of Present Illness: Victor Diaz is a 70 y.o. male who presents for post hospital follow-up of CABG. He underwent CABG 08/20/15. TEE echo EF 40-45% with diffuse hypokinesis and Trop. Echo in 07/2015 EF 60-65% with grade 2 DD. Postop he developed A. fib treated with amiodarone. He then developed postop ileus with cecal volvulus status post right hemicolectomy 08/25/15. He was seen in the ED 09/20/15 after developing granulation tissue along his wound he was seen by the surgeons who felt there was no evidence of infection.  He also has a history of syncope with subclavian artery stenosis status post stenting, history of alcohol abuse and felt to be of poor anticoagulation candidate because of it, hyperlipidemia.  Patient comes in today feeling and looking quite well. He is still getting physical therapy at home and is walking with a rolling/sitting walker. At home he just walks without assistance. He denies chest pain, palpitations, dyspnea, dizziness or presyncope. His main problem is getting his strength back.    Past Medical History  Diagnosis Date  . Allergy   . Gout   . Hyperlipidemia   . Alcohol abuse   . Hemothorax on right   . Lisfranc's dislocation 10/20/2011  . Right fibular fracture 10/20/2011  . Essential hypertension   . PSVT (paroxysmal supraventricular tachycardia) ([place])   . Paroxysmal atrial flutter (HCC)     Poor anticoagulation candidate with active alcohol abuse and syncope  . Subclavian artery stenosis, left     Status post stent placement by Dr. Trula Slade 10/2014    Past Surgical History  Procedure Laterality Date  . Appendectomy    . Hernia repair    . Tonsilectomy, adenoidectomy, bilateral myringotomy and tubes    . Revision total hip arthroplasty  08/28/2008  . Partial  hip arthroplasty Left 2010  . Peripheral vascular catheterization N/A 11/11/2014    Procedure: Aortic Arch Angiography/Left Subclavion Stent;  Surgeon: Serafina Mitchell, MD;  Location: [facility];  Service: Cardiovascular;  Laterality: N/A;  . Cardiac catheterization N/A 08/19/2015    Procedure: Left Heart Cath and Cors/Grafts Angiography;  Surgeon: Jettie Booze, MD;  Location: [facility];  Service: Cardiovascular;  Laterality: N/A;  . Coronary artery bypass graft N/A 08/20/2015    Procedure: CORONARY ARTERY BYPASS GRAFTING (CABG) TIMES FOUR  UTILIZING THE LEFT INTERNAL MAMMARY ARTERY AND ENDOSCOPICALLY HARVESTED RIGHT SAPHENEOUS VEINS. CLIPPING OF ATRIAL APPENDAGE;  Surgeon: Ivin Poot, MD;  Location: [facility];  Service: Open Heart Surgery;  Laterality: N/A;  . Tee without cardioversion N/A 08/20/2015    Procedure: TRANSESOPHAGEAL ECHOCARDIOGRAM (TEE);  Surgeon: Ivin Poot, MD;  Location: [facility];  Service: Open Heart Surgery;  Laterality: N/A;  . Laparotomy N/A 08/25/2015    Procedure: EXPLORATORY LAPAROTOMY;  Surgeon: Ralene Ok, MD;  Location: [facility];  Service: General;  Laterality: N/A;  . Partial colectomy N/A 08/25/2015    Procedure: ASCENDING AND TRANSVERSE COLECTOMY;  Surgeon: Ralene Ok, MD;  Location: [facility];  Service: General;  Laterality: N/A;     Current Outpatient Prescriptions  Medication Sig Dispense Refill  . acetaminophen (TYLENOL) 325 MG tablet Take 2 tablets (650 mg total) by mouth every 6 (six) hours as needed for mild pain.    Marland Kitchen amiodarone (  PACERONE) 200 MG tablet Take 1 tablet (200 mg total) by mouth daily. 60 tablet 3  . aspirin 325 MG tablet Take 1 tablet (325 mg total) by mouth daily.    . budesonide (PULMICORT) 0.5 MG/2ML nebulizer solution Take 2 mLs (0.5 mg total) by nebulization 2 (two) times daily.  12  . cetirizine (ZYRTEC) 10 MG tablet TAKE ONE TABLET BY MOUTH ONCE DAILY AS NEEDED. (Patient taking differently: Take 10 mg by mouth daily  as needed for allergies. TAKE ONE TABLET BY MOUTH ONCE DAILY AS NEEDED.) 60 tablet 0  . docusate sodium (COLACE) 100 MG capsule Take 2 capsules (200 mg total) by mouth daily. 10 capsule 0  . furosemide (LASIX) 40 MG tablet Take 1 tablet (40 mg total) by mouth daily. 30 tablet 1  . lisinopril (PRINIVIL,ZESTRIL) 2.5 MG tablet Take 1 tablet (2.5 mg total) by mouth daily. 30 tablet 1  . LORazepam (ATIVAN) 0.5 MG tablet Take 1 tablet (0.5 mg total) by mouth at bedtime. 30 tablet 0  . metoCLOPramide (REGLAN) 10 MG tablet Take 1 tablet (10 mg total) by mouth 4 (four) times daily. 120 tablet 0  . metoprolol succinate (TOPROL-XL) 25 MG 24 hr tablet **MUST SCHEDULE APPT FOR FURTHER REFILLS.** 180 tablet 0  . Multiple Vitamin (MULTIVITAMIN WITH MINERALS) TABS tablet Take 1 tablet by mouth daily. 30 tablet 0  . pantoprazole (PROTONIX) 40 MG tablet Take 1 tablet (40 mg total) by mouth daily. 30 tablet 1  . potassium chloride SA (K-DUR,KLOR-CON) 20 MEQ tablet Take 1 tablet (20 mEq total) by mouth 2 (two) times daily. 60 tablet 1  . traMADol (ULTRAM) 50 MG tablet Take 1 tablet (50 mg total) by mouth every 6 (six) hours as needed for moderate pain. 60 tablet 0   No current facility-administered medications for this visit.    Allergies:   Atorvastatin    Social History:  The patient  reports that he quit smoking about 46 years ago. His smoking use included Cigarettes. He has a 10 pack-year smoking history. He has never used smokeless tobacco. He reports that he drinks about 37.8 oz of alcohol per week. He reports that he does not use illicit drugs.   Family History:  The patient's    family history includes Arthritis in his mother; Cerebral aneurysm in his mother; Tuberculosis in his father.    ROS:  Please see the history of present illness.   Otherwise, review of systems are positive for weakness and fatigue  All other systems are reviewed and negative.    PHYSICAL EXAM: VS:  BP 106/64 mmHg  Pulse 89   Ht 5\' 6"  (1.676 m)  Wt 191 lb (86.637 kg)  BMI 30.84 kg/m2  SpO2 97% , BMI Body mass index is 30.84 kg/(m^2). GEN: Well nourished, well developed, in no acute distress Neck: no JVD, HJR, carotid bruits, or masses Cardiac: RRR; positive S4, no murmurs, rubs, thrill or heave,  Respiratory:  clear to auscultation bilaterally, normal work of breathing GI: Bandaged at incision site, soft, nontender, nondistended, + BS MS: no deformity or atrophy Extremities: without cyanosis, clubbing, edema, good distal pulses bilaterally.  Skin: warm and dry, no rash Neuro:  Strength and sensation are intact    EKG:  EKG is ordered today. The ekg ordered today demonstrates normal sinus rhythm with inferior Q waves, no acute change Recent Labs: 10/14/2014: TSH 3.418 09/07/2015: Magnesium 2.2 09/20/2015: ALT 23; BUN 9; Creatinine, Ser 1.26*; Hemoglobin 10.7*; Platelets 315; Potassium 3.8; Sodium 124*  Lipid Panel    Component Value Date/Time   CHOL 160 11/19/2013 1056   TRIG 64 09/06/2015 0520   TRIG 67 04/05/2006 1012   HDL 36.50* 11/19/2013 1056   CHOLHDL 4 11/19/2013 1056   CHOLHDL 3.2 CALC 04/05/2006 1012   VLDL 35.6 11/19/2013 1056   LDLCALC 88 11/19/2013 1056   LDLDIRECT 85.3 12/06/2012 1457      Wt Readings from Last 3 Encounters:  09/22/15 191 lb (86.637 kg)  09/20/15 190 lb (86.183 kg)  09/15/15 192 lb (87.091 kg)      Other studies Reviewed: Additional studies/ records that were reviewed today include and review of the records demonstrates:   Study Conclusions   - Left ventricle: Systolic function was mildly to moderately   reduced. The estimated ejection fraction was in the range of 40%   to 45%. Diffuse hypokinesis. - Aortic valve: Cusp separation was mildly reduced. Right coronary   cusp mobility was restricted. There was mild stenosis. There was   trivial regurgitation originating from the central coaptation   point. Valve area (VTI): 1.08 cm^2. Valve area (Vmax): 1.05  cm^2.   Valve area (Vmean): 1.06 cm^2. - Mitral valve: Leaflet separation was reduced. There was mild   regurgitation, with multiple jets directed centrally. Valve area   by continuity equation (using LVOT flow): 1.9 cm^2. - Left atrium: No evidence of thrombus in the atrial cavity or   appendage. - Atrial septum: No defect or patent foramen ovale was identified. - Tricuspid valve: No evidence of vegetation. - Pulmonic valve: No evidence of vegetation.   Intraoperative transesophageal echocardiography.  Birthdate: Echocardiogram 08/12/2015: Study Conclusions   - Left ventricle: The cavity size was normal. Wall thickness was   increased in a pattern of mild LVH. Systolic function was normal.   The estimated ejection fraction was in the range of 60% to 65%.   Features are consistent with a pseudonormal left ventricular   filling pattern, with concomitant abnormal relaxation and   increased filling pressure (grade 2 diastolic dysfunction).   Doppler parameters are consistent with high ventricular filling   pressure. - Regional wall motion abnormality: Mild hypokinesis of the mid   inferior and mid inferolateral myocardium. - Aortic valve: Mildly to moderately calcified annulus. Mildly   thickened, mildly calcified leaflets. There was mild stenosis.   Peak velocity (S): 213 cm/s. Mean gradient (S): 9 mm Hg. - Mitral valve: Calcified annulus. There was mild regurgitation. - Left atrium: The atrium was mildly dilated.   ASSESSMENT AND PLAN:  S/P CABG x 4 Patient had recent CABG 4 complicated by postop atrial fibrillation treated with amiodarone. He is now in normal sinus rhythm. We'll decrease amiodarone to 200 mg once daily. 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 He actually looks quite well today and is progressing nicely. Recommend cardiac rehabilitation once he finishes home PT.  Paroxysmal atrial fibrillation (HCC) Patient is maintaining  normal sinus rhythm on amiodarone. We'll decrease dose to 200 mg once daily. Continue metoprolol 25 mg once daily. Follow-up with Dr. Domenic Polite in 2 months.  Essential hypertension Blood pressure controlled  Hyperlipidemia Patient's allergic to atorvastatin. To be addressed on follow-up visit with Dr. Domenic Polite.    Sumner Boast, PA-C  09/22/2015 12:24 PM    [facility] [place], [place], [place]  [address] Phone: [phone]; Fax: [phone]

## 2015-09-22 NOTE — Assessment & Plan Note (Addendum)
Patient had recent CABG 4 complicated by postop atrial fibrillation treated with amiodarone. He is now in normal sinus rhythm. We'll decrease amiodarone to 200 mg once daily. 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 He actually looks quite well today and is progressing nicely. Recommend cardiac rehabilitation once he finishes home PT.

## 2015-09-22 NOTE — Assessment & Plan Note (Signed)
Blood pressure controlled. 

## 2015-09-22 NOTE — Assessment & Plan Note (Signed)
Patient is maintaining normal sinus rhythm on amiodarone. We'll decrease dose to 200 mg once daily. Continue metoprolol 25 mg once daily. Follow-up with Dr. Domenic Polite in 2 months.

## 2015-09-24 ENCOUNTER — Ambulatory Visit: Payer: Medicare Other | Admitting: Adult Health

## 2015-09-27 ENCOUNTER — Other Ambulatory Visit: Payer: Self-pay | Admitting: Adult Health

## 2015-09-27 ENCOUNTER — Ambulatory Visit: Payer: Medicare Other | Admitting: Adult Health

## 2015-10-01 ENCOUNTER — Other Ambulatory Visit: Payer: Self-pay | Admitting: Cardiothoracic Surgery

## 2015-10-01 DIAGNOSIS — Z951 Presence of aortocoronary bypass graft: Secondary | ICD-10-CM

## 2015-10-11 ENCOUNTER — Ambulatory Visit: Payer: Self-pay

## 2015-10-12 ENCOUNTER — Telehealth: Payer: Self-pay | Admitting: Cardiology

## 2015-10-12 NOTE — Telephone Encounter (Signed)
Called pt and he stated that he is feeling a lot better now. He had not eaten in about 24 hours and had only drank a few diet sodas. He stated he ate a little and feels much better. I offered him an appointment for tomorrow but he stated he would call us back if he started feeling bad again.

## 2015-10-12 NOTE — Telephone Encounter (Signed)
Pt called stating he's been feeling light headed, would like to know if he needs to make an apt

## 2015-10-13 ENCOUNTER — Ambulatory Visit: Payer: Medicare Other | Admitting: Cardiothoracic Surgery

## 2015-10-18 ENCOUNTER — Ambulatory Visit
Admission: RE | Admit: 2015-10-18 | Discharge: 2015-10-18 | Disposition: A | Discharge status: Home / Self Care | Payer: Medicare Other | Source: Ambulatory Visit | Attending: Cardiothoracic Surgery | Admitting: Cardiothoracic Surgery

## 2015-10-18 ENCOUNTER — Ambulatory Visit (INDEPENDENT_AMBULATORY_CARE_PROVIDER_SITE_OTHER): Payer: Self-pay | Admitting: Physician Assistant

## 2015-10-18 VITALS — BP 127/68 | HR 68 | Resp 20 | Ht 66.0 in | Wt 190.0 lb

## 2015-10-18 DIAGNOSIS — Z951 Presence of aortocoronary bypass graft: Secondary | ICD-10-CM

## 2015-10-18 MED ORDER — LISINOPRIL 2.5 MG PO TABS: 2.5000 mg | ORAL_TABLET | Freq: Every day | ORAL | Status: DC

## 2015-10-18 MED ORDER — PANTOPRAZOLE SODIUM 40 MG PO TBEC: 40.0000 mg | DELAYED_RELEASE_TABLET | Freq: Every day | ORAL | Status: DC

## 2015-10-18 MED ORDER — SIMVASTATIN 20 MG PO TABS: 20.0000 mg | ORAL_TABLET | Freq: Every day | ORAL | Status: DC

## 2015-10-18 MED ORDER — METOPROLOL SUCCINATE ER 25 MG PO TB24: ORAL_TABLET | ORAL | Status: DC

## 2015-10-18 NOTE — Progress Notes (Signed)
HPI:  Patient returns for routine postoperative follow-up having undergone CABG x 5 on 08/20/2015. The patient's early postoperative recovery while in the hospital was notable for Atrial Fibrillation, Post op Ileus with development of volvulus.  He required ex lap with Right hemicolectomy. He required detox treatment for alcohol abuse and he was discharged to [facility] for further strengthening. Since hospital discharge the patient reports he has been doing okay.  He was sent to the ED via H/H nurse for concerns of infection from his abdominal wound.  He was evaluated by Gen. Surgery and his wound was felt to be fine.  He is ambulating some, but states at sometimes he is dizzy and has to rest.  He has resumed drinking again for the past week and half.  He is down to 3-4 drinks per day and he thinks if he had some medication for his anxiety he would be able to possibly stop.  He complains of a tremor of his right arm, which has progressively gotten worse since surgery.  Finally, he has a dry cough which which is also new for him since surgery.  They do request refills on some medications and a referral for PT to continue the patient's rehab.   Current Outpatient Prescriptions  Medication Sig Dispense Refill  . acetaminophen (TYLENOL) 325 MG tablet Take 2 tablets (650 mg total) by mouth every 6 (six) hours as needed for mild pain.    Marland Kitchen aspirin 325 MG tablet Take 1 tablet (325 mg total) by mouth daily.    . cetirizine (ZYRTEC) 10 MG tablet TAKE ONE TABLET BY MOUTH ONCE DAILY AS NEEDED. 60 tablet 0  . docusate sodium (COLACE) 100 MG capsule Take 2 capsules (200 mg total) by mouth daily. (Patient not taking: Reported on 10/18/2015) 10 capsule 0  . lisinopril (PRINIVIL,ZESTRIL) 2.5 MG tablet Take 1 tablet (2.5 mg total) by mouth daily. 30 tablet 3  . LORazepam (ATIVAN) 0.5 MG tablet Take 1 tablet (0.5 mg total) by mouth at bedtime. 30 tablet 0  . metoCLOPramide (REGLAN) 10 MG tablet Take 1 tablet (10 mg total) by  mouth 4 (four) times daily. 120 tablet 0  . metoprolol succinate (TOPROL-XL) 25 MG 24 hr tablet Take 1/2 tablet by mouth daily 180 tablet 0  . Multiple Vitamin (MULTIVITAMIN WITH MINERALS) TABS tablet Take 1 tablet by mouth daily. 30 tablet 0  . pantoprazole (PROTONIX) 40 MG tablet Take 1 tablet (40 mg total) by mouth daily. 30 tablet 1  . pantoprazole (PROTONIX) 40 MG tablet Take 1 tablet (40 mg total) by mouth daily. 30 tablet 3  . potassium chloride SA (K-DUR,KLOR-CON) 20 MEQ tablet Take 1 tablet (20 mEq total) by mouth 2 (two) times daily. 60 tablet 1  . simvastatin (ZOCOR) 20 MG tablet Take 1 tablet (20 mg total) by mouth daily. 30 tablet 3  . traMADol (ULTRAM) 50 MG tablet Take 1 tablet (50 mg total) by mouth every 6 (six) hours as needed for moderate pain. 60 tablet 0   No current facility-administered medications for this visit.    Physical Exam:  BP 127/68 mmHg  Pulse 68  Resp 20  Ht 5\' 6"  (1.676 m)  Wt 190 lb (86.183 kg)  BMI 30.68 kg/m2  SpO2 98%  Gen: no apparent distress Heart: RRR Lungs: CTA bilaterally Ext: no LE edema, RUE with noticeable resting tremor Skin: incisions well healed  Diagnostic Tests:  CXR: post surgical changes from CABG, no significant pleural effusion  A/P;  1. S/P  CABG- doing okay, needs to improve strength and overall condition 2. Atrial Fibrillation- maintaining NSR, Amiodarone was decreased last month by Estella Husk PA-C, will d/c Amiodarone today.. Which could help alleviate his resting tremor 3. Alcohol abuse- patient counseled on importance of alcohol cessation, explained tremor could be a result from alcohol use/withdraw... In regards to anxiety medication instructed patient to follow up with PCP vs. Psychiatry who can provide proper medications and monitor patient as needed  4. GI- functions have returned to normal, incisions well healed 5. Dizziness- again could be related to alcohol use, however will d/c Lasix as no LE edema  present and patient's states has not taken in last 3 days, will also decrease Lopressor dose as HR is 68... Instructed patient to avoid extreme heat as well as sometimes this has occurred when outside in the heat 6. Cough- could be related to use of ACE, however patient has been out of this medication for the past 3 days and has not noticed a difference in his cough, refill provided... If remains problem, could possibly transition to Cozaar at Cardiology visit next month 7. Dispo- RTC in 6-8 weeks with follow with Dr. Prescott Gum, contact our office sooner should issues arise   Ellwood Handler, PA-C [facility] [phone]

## 2015-11-30 ENCOUNTER — Other Ambulatory Visit: Payer: Self-pay | Admitting: Adult Health

## 2015-12-01 ENCOUNTER — Ambulatory Visit: Payer: Medicare Other | Admitting: Cardiothoracic Surgery

## 2015-12-07 ENCOUNTER — Ambulatory Visit: Payer: Medicare Other | Admitting: Cardiology

## 2015-12-08 ENCOUNTER — Ambulatory Visit: Payer: Medicare Other | Admitting: Cardiothoracic Surgery

## 2015-12-14 ENCOUNTER — Other Ambulatory Visit: Payer: Self-pay | Admitting: Cardiothoracic Surgery

## 2015-12-14 DIAGNOSIS — Z951 Presence of aortocoronary bypass graft: Secondary | ICD-10-CM

## 2015-12-15 ENCOUNTER — Other Ambulatory Visit: Payer: Self-pay

## 2015-12-15 ENCOUNTER — Ambulatory Visit
Admission: RE | Admit: 2015-12-15 | Discharge: 2015-12-15 | Disposition: A | Discharge status: Home / Self Care | Payer: Medicare Other | Source: Ambulatory Visit | Attending: Cardiothoracic Surgery | Admitting: Cardiothoracic Surgery

## 2015-12-15 ENCOUNTER — Ambulatory Visit (INDEPENDENT_AMBULATORY_CARE_PROVIDER_SITE_OTHER): Payer: Medicare Other | Admitting: Cardiothoracic Surgery

## 2015-12-15 ENCOUNTER — Encounter: Payer: Self-pay | Admitting: Cardiothoracic Surgery

## 2015-12-15 VITALS — BP 140/70 | HR 60 | Resp 20 | Ht 66.0 in | Wt 190.0 lb

## 2015-12-15 DIAGNOSIS — Z951 Presence of aortocoronary bypass graft: Secondary | ICD-10-CM | POA: Diagnosis not present

## 2015-12-15 NOTE — Progress Notes (Signed)
PCP is Jani Gravel, MD Referring Provider is Jettie Booze, MD  Chief Complaint  Patient presents with  . Routine Post Op    8 week f/u with CXR    HPI: Final 3 month follow-up after multivessel CABG for unstable angina. Postop course complicated by cecal ileus requiring emergency colectomy with full recovery. The patient is not smoking. This patient is drinking 4-6 beers daily. The patient denies symptoms of angina or heart failure. The patient is not on a regular walking schedule. Patient's weight has been stable. Chest x-ray today is clear with out pleural effusion stable cardiac silhouette The patient is compliant with his medications  Past Medical History:  Diagnosis Date  . Alcohol abuse   . Allergy   . Essential hypertension   . Gout   . Hemothorax on right   . Hyperlipidemia   . Lisfranc's dislocation 10/20/2011  . Paroxysmal atrial flutter (HCC)    Poor anticoagulation candidate with active alcohol abuse and syncope  . PSVT (paroxysmal supraventricular tachycardia) ([place])   . Right fibular fracture 10/20/2011  . Subclavian artery stenosis, left    Status post stent placement by Dr. Trula Slade 10/2014    Past Surgical History:  Procedure Laterality Date  . APPENDECTOMY    . CARDIAC CATHETERIZATION N/A 08/19/2015   Procedure: Left Heart Cath and Cors/Grafts Angiography;  Surgeon: Jettie Booze, MD;  Location: [place] [facility];  Service: Cardiovascular;  Laterality: N/A;  . CORONARY ARTERY BYPASS GRAFT N/A 08/20/2015   Procedure: CORONARY ARTERY BYPASS GRAFTING (CABG) TIMES FOUR  UTILIZING THE LEFT INTERNAL MAMMARY ARTERY AND ENDOSCOPICALLY HARVESTED RIGHT SAPHENEOUS VEINS. CLIPPING OF ATRIAL APPENDAGE;  Surgeon: Ivin Poot, MD;  Location: [place];  Service: Open Heart Surgery;  Laterality: N/A;  . HERNIA REPAIR    . LAPAROTOMY N/A 08/25/2015   Procedure: EXPLORATORY LAPAROTOMY;  Surgeon: Ralene Ok, MD;  Location: [place];  Service: General;  Laterality: N/A;   . PARTIAL COLECTOMY N/A 08/25/2015   Procedure: ASCENDING AND TRANSVERSE COLECTOMY;  Surgeon: Ralene Ok, MD;  Location: [place];  Service: General;  Laterality: N/A;  . PARTIAL HIP ARTHROPLASTY Left 2010  . PERIPHERAL VASCULAR CATHETERIZATION N/A 11/11/2014   Procedure: Aortic Arch Angiography/Left Subclavion Stent;  Surgeon: Serafina Mitchell, MD;  Location: [place] [facility];  Service: Cardiovascular;  Laterality: N/A;  . REVISION TOTAL HIP ARTHROPLASTY  08/28/2008  . TEE WITHOUT CARDIOVERSION N/A 08/20/2015   Procedure: TRANSESOPHAGEAL ECHOCARDIOGRAM (TEE);  Surgeon: Ivin Poot, MD;  Location: [place];  Service: Open Heart Surgery;  Laterality: N/A;  . TONSILECTOMY, ADENOIDECTOMY, BILATERAL MYRINGOTOMY AND TUBES      Family History  Problem Relation Age of Onset  . Arthritis Mother   . Cerebral aneurysm Mother   . Tuberculosis Father     Social History Social History  Substance Use Topics  . Smoking status: Former Smoker    Packs/day: 1.00    Years: 10.00    Types: Cigarettes    Quit date: 08/09/1969  . Smokeless tobacco: Never Used  . Alcohol use 37.8 oz/week    63 Cans of beer per week     Comment: Daily    Current Outpatient Prescriptions  Medication Sig Dispense Refill  . acetaminophen (TYLENOL) 325 MG tablet Take 2 tablets (650 mg total) by mouth every 6 (six) hours as needed for mild pain.    Marland Kitchen aspirin 325 MG tablet Take 1 tablet (325 mg total) by mouth daily.    . cetirizine (  ZYRTEC) 10 MG tablet TAKE ONE TABLET BY MOUTH ONCE DAILY AS NEEDED. 60 tablet 0  . docusate sodium (COLACE) 100 MG capsule Take 2 capsules (200 mg total) by mouth daily. 10 capsule 0  . lisinopril (PRINIVIL,ZESTRIL) 2.5 MG tablet Take 1 tablet (2.5 mg total) by mouth daily. 30 tablet 3  . LORazepam (ATIVAN) 0.5 MG tablet Take 1 tablet (0.5 mg total) by mouth at bedtime. 30 tablet 0  . metoCLOPramide (REGLAN) 10 MG tablet Take 1 tablet (10 mg total) by mouth 4 (four) times daily. 120 tablet 0   . metoprolol succinate (TOPROL-XL) 25 MG 24 hr tablet Take 1/2 tablet by mouth daily 180 tablet 0  . Multiple Vitamin (MULTIVITAMIN WITH MINERALS) TABS tablet Take 1 tablet by mouth daily. 30 tablet 0  . pantoprazole (PROTONIX) 40 MG tablet Take 1 tablet (40 mg total) by mouth daily. 30 tablet 3  . potassium chloride SA (K-DUR,KLOR-CON) 20 MEQ tablet Take 1 tablet (20 mEq total) by mouth 2 (two) times daily. 60 tablet 1  . simvastatin (ZOCOR) 20 MG tablet Take 1 tablet (20 mg total) by mouth daily. 30 tablet 3  . traMADol (ULTRAM) 50 MG tablet Take 1 tablet (50 mg total) by mouth every 6 (six) hours as needed for moderate pain. 60 tablet 0   No current facility-administered medications for this visit.     Allergies  Allergen Reactions  . Atorvastatin Anaphylaxis    LIPITOR -  facial swelling    Review of Systems  Appetite good weight stable No visual complaints No difficulty swallowing Normal bowel habits after colectomy No ankle edema Some orthostatic dizziness  BP 140/70 (BP Location: Left Arm, Patient Position: Sitting, Cuff Size: Normal)   Pulse 60   Resp 20   Ht 5\' 6"  (1.676 m)   Wt 190 lb (86.2 kg)   SpO2 98% Comment: RA  BMI 30.67 kg/m  Physical Exam      Exam    Sternal incision well-healed   General- alert and comfortable   Lungs- clear without rales, wheezes   Cor- regular rate and rhythm, no murmur , gallop   Abdomen- soft, non-tender   Extremities - warm, non-tender, minimal edema   Neuro- oriented, appropriate, no focal weakness   Diagnostic Tests: Chest x-ray clear  Impression: Continue Zocor aspirin metoprolol and lisinopril long-term.  Plan: Return as needed   heart healthy diet and exercise program again recommended to the patient as well as alcohol abstinence   Len Childs, MD [facility] [phone]

## 2015-12-21 ENCOUNTER — Ambulatory Visit: Payer: Medicare Other | Admitting: Cardiology

## 2016-01-03 ENCOUNTER — Telehealth: Payer: Self-pay | Admitting: Cardiology

## 2016-01-03 NOTE — Telephone Encounter (Signed)
Reviewed. His medication list does include Prilosec to be used as needed. He should probably have this filled by PCP since we are not specifically following this.

## 2016-01-03 NOTE — Telephone Encounter (Signed)
LM with Dr McDowell's message to get rx from pcp

## 2016-01-03 NOTE — Telephone Encounter (Signed)
Will forward to Dr Magdalene River to reach pt to triage

## 2016-01-05 ENCOUNTER — Encounter (HOSPITAL_COMMUNITY): Payer: Medicare Other

## 2016-02-04 ENCOUNTER — Other Ambulatory Visit (HOSPITAL_COMMUNITY): Payer: Self-pay | Admitting: Internal Medicine

## 2016-02-04 DIAGNOSIS — R197 Diarrhea, unspecified: Secondary | ICD-10-CM

## 2016-02-04 DIAGNOSIS — R1010 Upper abdominal pain, unspecified: Secondary | ICD-10-CM

## 2016-02-04 DIAGNOSIS — R1901 Right upper quadrant abdominal swelling, mass and lump: Secondary | ICD-10-CM

## 2016-02-08 ENCOUNTER — Other Ambulatory Visit: Payer: Self-pay | Admitting: Adult Health

## 2016-02-16 ENCOUNTER — Ambulatory Visit: Payer: Self-pay | Admitting: General Surgery

## 2016-02-16 NOTE — H&P (Signed)
History of Present Illness Ralene Ok MD; 02/16/2016 3:42 PM) The patient is a 70 year old male who presents with an incisional hernia. 70 year old male who presented been seen secondary to a cecal volvulus. Patient underwent right hemicolectomy. This occurred post CABG. Patient been doing well postoperatively. He states that recently in the last 2 months he noticed a bulge in the upper portion of his incision. He states he does have some soreness and pain area. He's had no signs or symptoms of incarceration or strangulation. There is no exacerbating or relieving factors.  Patient sees Dr. Domenic Polite is his cardiologist.   Allergies Nance Pear, [place]; 02/16/2016 3:20 PM) Atorvastatin Calcium *ANTIHYPERLIPIDEMICS*  Medication History Nance Pear, CMA; 02/16/2016 3:21 PM) LORazepam (0.5MG  Tablet, Oral as needed) Active. Furosemide (40MG  Tablet, Oral) Active. Amiodarone HCl (200MG  Tablet, Oral) Active. Omeprazole (20MG  Capsule DR, Oral) Active. Nitroglycerin (0.4MG  Tab Sublingual, Sublingual as needed) Active. Metoprolol Succinate ER (25MG  Tablet ER 24HR, Oral) Active. Simvastatin (40MG  Tablet, Oral) Active. Propranolol HCl (20MG  Tablet, Oral) Active. Pantoprazole Sodium (40MG  Tablet DR, Oral) Active. Lisinopril (2.5MG  Tablet, Oral) Active. Medications Reconciled  Vitals (Sade Bradford CMA; 02/16/2016 3:22 PM) 02/16/2016 3:21 PM Weight: 198 lb Height: 66in Body Surface Area: 1.99 m Body Mass Index: 31.96 kg/m  Temp.: 15F  Pulse: 71 (Regular)  BP: 122/78 (Sitting, Left Arm, Standard)       Physical Exam Ralene Ok MD; 02/16/2016 3:42 PM) General Mental Status-Alert. General Appearance-Consistent with stated age. Hydration-Well hydrated. Voice-Normal.  Head and Neck Head-normocephalic, atraumatic with no lesions or palpable masses. Trachea-midline. Thyroid Gland Characteristics - normal size and consistency.  Chest and  Lung Exam Chest and lung exam reveals -quiet, even and easy respiratory effort with no use of accessory muscles and on auscultation, normal breath sounds, no adventitious sounds and normal vocal resonance. Inspection Chest Wall - Normal. Back - normal.  Cardiovascular Cardiovascular examination reveals -normal heart sounds, regular rate and rhythm with no murmurs and normal pedal pulses bilaterally.  Abdomen Inspection Skin - Scar - no surgical scars. Hernias - Incisional - Reducible(Upper midline incisional hernia). Palpation/Percussion Normal exam - Soft, Non Tender, No Rebound tenderness, No Rigidity (guarding) and No hepatosplenomegaly. Auscultation Normal exam - Bowel sounds normal.    Assessment & Plan Ralene Ok MD; 02/16/2016 3:44 PM) INCISIONAL HERNIA, WITHOUT OBSTRUCTION OR GANGRENE (K43.2) Impression: 70 year old male with an incisional hernia this upper midline incision.  1. The patient sees Dr. Domenic Polite is his cardiologist. We will obtain cardiac clearance prior to scheduling surgery 2. The patient will like to proceed to the operating room for laparoscopic TEP incisional hernia repair with mesh.  3. I discussed with the patient the signs and symptoms of incarceration and strangulation and the need to proceed to the ER should they occur.  4. I discussed with the patient the risks and benefits of the procedure to include but not limited to: Infection, bleeding, damage to surrounding structures, possible need for further surgery, possible nerve pain, and possible recurrence. The patient was understanding and wishes to proceed.

## 2016-02-24 NOTE — Progress Notes (Signed)
Cardiology Office Note  Date: 03/01/2016   ID: Victor Diaz, DOB 1945/10/09, MRN [id number]  PCP: Jani Gravel, MD  Primary Cardiologist: Rozann Lesches, MD   Chief Complaint  Patient presents with  . Coronary Artery Disease  . Postoperative atrial fibrillation    History of Present Illness: Victor Diaz is a 70 y.o. male that I last saw in April. Interval records reviewed. He is now status post CABG in the setting of multivessel disease with unstable angina, surgery performed in May by Dr. Prescott Gum. Patient did experience atrial fibrillation postoperatively and was placed on amiodarone. He was not anticoagulated given prior concerns about alcohol abuse and syncope. At his follow-up visit with Ms. Vita Barley in June he was in sinus rhythm.  He is now being considered for laparoscopic TEP incisional hernia repair with mesh by Dr.Ramirez. He has a history of right hemicolectomy.  Victor Diaz denies any angina symptoms. He has not been very active, not exercising at this time. He does report compliance with his medications. Cardiac regimen includes aspirin, lisinopril, Toprol-XL, and Zocor. He is not using nitroglycerin. I reviewed his ECG today which shows sinus rhythm with evidence of old inferior infarct pattern.  Past Medical History:  Diagnosis Date  . Alcohol abuse   . Allergy   . Essential hypertension   . Gout   . Hemothorax on right   . Hyperlipidemia   . Lisfranc's dislocation 10/20/2011  . Paroxysmal atrial flutter (HCC)    Poor anticoagulation candidate with active alcohol abuse and syncope  . PSVT (paroxysmal supraventricular tachycardia) ([place])   . Right fibular fracture 10/20/2011  . Subclavian artery stenosis, left (HCC)    Status post stent placement by Dr. Trula Slade 10/2014    Past Surgical History:  Procedure Laterality Date  . APPENDECTOMY    . CARDIAC CATHETERIZATION N/A 08/19/2015   Procedure: Left Heart Cath and Cors/Grafts Angiography;  Surgeon:  Jettie Booze, MD;  Location: [place] [facility];  Service: Cardiovascular;  Laterality: N/A;  . CORONARY ARTERY BYPASS GRAFT N/A 08/20/2015   Procedure: CORONARY ARTERY BYPASS GRAFTING (CABG) TIMES FOUR  UTILIZING THE LEFT INTERNAL MAMMARY ARTERY AND ENDOSCOPICALLY HARVESTED RIGHT SAPHENEOUS VEINS. CLIPPING OF ATRIAL APPENDAGE;  Surgeon: Ivin Poot, MD;  Location: [place];  Service: Open Heart Surgery;  Laterality: N/A;  . HERNIA REPAIR    . LAPAROTOMY N/A 08/25/2015   Procedure: EXPLORATORY LAPAROTOMY;  Surgeon: Ralene Ok, MD;  Location: [place];  Service: General;  Laterality: N/A;  . PARTIAL COLECTOMY N/A 08/25/2015   Procedure: ASCENDING AND TRANSVERSE COLECTOMY;  Surgeon: Ralene Ok, MD;  Location: [place];  Service: General;  Laterality: N/A;  . PARTIAL HIP ARTHROPLASTY Left 2010  . PERIPHERAL VASCULAR CATHETERIZATION N/A 11/11/2014   Procedure: Aortic Arch Angiography/Left Subclavion Stent;  Surgeon: Serafina Mitchell, MD;  Location: [place] [facility];  Service: Cardiovascular;  Laterality: N/A;  . REVISION TOTAL HIP ARTHROPLASTY  08/28/2008  . TEE WITHOUT CARDIOVERSION N/A 08/20/2015   Procedure: TRANSESOPHAGEAL ECHOCARDIOGRAM (TEE);  Surgeon: Ivin Poot, MD;  Location: [place];  Service: Open Heart Surgery;  Laterality: N/A;  . TONSILECTOMY, ADENOIDECTOMY, BILATERAL MYRINGOTOMY AND TUBES      Current Outpatient Prescriptions  Medication Sig Dispense Refill  . aspirin 325 MG tablet Take 1 tablet (325 mg total) by mouth daily.    . cetirizine (ZYRTEC) 10 MG tablet TAKE ONE TABLET BY MOUTH ONCE DAILY AS NEEDED. 60 tablet 0  . docusate sodium (  COLACE) 100 MG capsule Take 2 capsules (200 mg total) by mouth daily. 10 capsule 0  . lisinopril (PRINIVIL,ZESTRIL) 2.5 MG tablet Take 1 tablet (2.5 mg total) by mouth daily. 30 tablet 6  . LORazepam (ATIVAN) 0.5 MG tablet Take 1 tablet (0.5 mg total) by mouth at bedtime. 30 tablet 0  . metoprolol succinate (TOPROL-XL) 25 MG 24 hr tablet  Take 1/2 tablet by mouth daily 180 tablet 0  . Multiple Vitamin (MULTIVITAMIN WITH MINERALS) TABS tablet Take 1 tablet by mouth daily. 30 tablet 0  . pantoprazole (PROTONIX) 40 MG tablet Take 1 tablet (40 mg total) by mouth daily. 30 tablet 3  . simvastatin (ZOCOR) 20 MG tablet Take 1 tablet (20 mg total) by mouth daily. 30 tablet 3   No current facility-administered medications for this visit.    Allergies:  Atorvastatin   Social History: The patient  reports that he quit smoking about 46 years ago. His smoking use included Cigarettes. He started smoking about 58 years ago. He has a 10.00 pack-year smoking history. He has never used smokeless tobacco. He reports that he drinks about 37.8 oz of alcohol per week . He reports that he does not use drugs.   ROS:  Please see the history of present illness. Otherwise, complete review of systems is positive for NYHA class II dyspnea with typical activities.  All other systems are reviewed and negative.   Physical Exam: VS:  BP (!) 144/77   Pulse 72   Ht 5\' 6"  (1.676 m)   Wt 200 lb (90.7 kg)   BMI 32.28 kg/m , BMI Body mass index is 32.28 kg/m.  Wt Readings from Last 3 Encounters:  03/01/16 200 lb (90.7 kg)  12/15/15 190 lb (86.2 kg)  10/18/15 190 lb (86.2 kg)    General: Obese male, appears comfortable at rest. HEENT: Conjunctiva and lids normal, oropharynx clear. Neck: Supple, no elevated JVP or carotid bruits, no thyromegaly. Lungs: Clear to auscultation, nonlabored breathing at rest. Thorax: Well-healed sternal incision. Cardiac: Regular rate and rhythm, no S3, [id number] systolic murmur, no pericardial rub. Abdomen: Protuberant, nontender, bowel sounds present, healed lower midline incision, no guarding or rebound. Extremities: Mild lower leg edema, distal pulses 2+. Skin: Warm and dry. Musculoskeletal: No kyphosis. Neuropsychiatric: Alert and oriented x3, affect grossly appropriate.  ECG: I personally reviewed the tracing from  09/22/2015 which showed sinus rhythm with old inferolateral infarct pattern.  Recent Labwork: 09/07/2015: Magnesium 2.2 09/20/2015: ALT 23; AST 31; BUN 9; Creatinine, Ser 1.26; Hemoglobin 10.7; Platelets 315; Potassium 3.8; Sodium 124     Component Value Date/Time   CHOL 160 11/19/2013 1056   TRIG 64 09/06/2015 0520   TRIG 67 04/05/2006 1012   HDL 36.50 (L) 11/19/2013 1056   CHOLHDL 4 11/19/2013 1056   VLDL 35.6 11/19/2013 1056   LDLCALC 88 11/19/2013 1056   LDLDIRECT 85.3 12/06/2012 1457    Other Studies Reviewed Today:  Cardiac catheterization 08/19/2015:  Ost Cx to Prox Cx lesion, 100% stenosed. Left to left collaterals fill this vessel.  Prox LAD to Mid LAD lesion, 70% stenosed.  Mid LAD lesion, 75% stenosed.  Ost 2nd Diag to 2nd Diag lesion, 75% stenosed.  Ost RCA to Prox RCA lesion, 90% stenosed.  Mid RCA lesion, 50% stenosed.  Normal LVEDP.  Patent left subclavian stent and LIMA.  TEE 08/19/2013 (intraoperative): Study Conclusions  - Left ventricle: Systolic function was mildly to moderately   reduced. The estimated ejection fraction was in the range of  40%   to 45%. Diffuse hypokinesis. - Aortic valve: Cusp separation was mildly reduced. Right coronary   cusp mobility was restricted. There was mild stenosis. There was   trivial regurgitation originating from the central coaptation   point. Valve area (VTI): 1.08 cm^2. Valve area (Vmax): 1.05 cm^2.   Valve area (Vmean): 1.06 cm^2. - Mitral valve: Leaflet separation was reduced. There was mild   regurgitation, with multiple jets directed centrally. Valve area   by continuity equation (using LVOT flow): 1.9 cm^2. - Left atrium: No evidence of thrombus in the atrial cavity or   appendage. - Atrial septum: No defect or patent foramen ovale was identified. - Tricuspid valve: No evidence of vegetation. - Pulmonic valve: No evidence of vegetation.  Assessment and Plan:  1. Preoperative evaluation in a 70 year old  male now approximately 6 months out from CABG and clinically stable medical therapy, mild aortic stenosis, and postoperative atrial fibrillation without recurrence and off antiarrhythmic therapy at this point. He is being considered for TEP incisional hernia repair with mesh by Dr.Ramirez. ECG reviewed and stable. Victor Diaz should be able to proceed with planned surgery at an except will perioperative cardiac risk without further testing being arranged at this time. He may hold aspirin temporarily as needed.  2. Cardiomyopathy with LVEF approximately 45%. Reports stable NYHA class II dyspnea. No evidence of volume overload.  3. Postoperative atrial fibrillation after CABG, no obvious recurrence. He is no longer on antiarrhythmic therapy.  4. Essential hypertension, no changes made to current regimen.  Current medicines were reviewed with the patient today.   Orders Placed This Encounter  Procedures  . EKG 12-Lead     Disposition: Follow-up in 6 months.  Signed, Satira Sark, MD, [facility] 03/01/2016 10:08 AM    [facility] [place], [place], [place] [address] Phone: [phone]; Fax: [phone]

## 2016-02-28 ENCOUNTER — Encounter: Payer: Self-pay | Admitting: *Deleted

## 2016-03-01 ENCOUNTER — Encounter: Payer: Self-pay | Admitting: Cardiology

## 2016-03-01 ENCOUNTER — Ambulatory Visit (INDEPENDENT_AMBULATORY_CARE_PROVIDER_SITE_OTHER): Payer: Medicare Other | Admitting: Cardiology

## 2016-03-01 VITALS — BP 144/77 | HR 72 | Ht 66.0 in | Wt 200.0 lb

## 2016-03-01 DIAGNOSIS — Z23 Encounter for immunization: Secondary | ICD-10-CM

## 2016-03-01 DIAGNOSIS — Z01810 Encounter for preprocedural cardiovascular examination: Secondary | ICD-10-CM | POA: Diagnosis not present

## 2016-03-01 DIAGNOSIS — I25810 Atherosclerosis of coronary artery bypass graft(s) without angina pectoris: Secondary | ICD-10-CM | POA: Diagnosis not present

## 2016-03-01 DIAGNOSIS — I350 Nonrheumatic aortic (valve) stenosis: Secondary | ICD-10-CM

## 2016-03-01 DIAGNOSIS — I10 Essential (primary) hypertension: Secondary | ICD-10-CM

## 2016-03-01 DIAGNOSIS — I2510 Atherosclerotic heart disease of native coronary artery without angina pectoris: Secondary | ICD-10-CM

## 2016-03-01 DIAGNOSIS — I4891 Unspecified atrial fibrillation: Secondary | ICD-10-CM

## 2016-03-01 DIAGNOSIS — I9789 Other postprocedural complications and disorders of the circulatory system, not elsewhere classified: Secondary | ICD-10-CM | POA: Diagnosis not present

## 2016-03-01 MED ORDER — LISINOPRIL 2.5 MG PO TABS: 2.5000 mg | ORAL_TABLET | Freq: Every day | ORAL | 6 refills | Status: DC

## 2016-03-01 NOTE — Patient Instructions (Signed)

## 2016-03-30 ENCOUNTER — Encounter (HOSPITAL_COMMUNITY)
Admission: RE | Admit: 2016-03-30 | Discharge: 2016-03-30 | Disposition: A | Discharge status: Home / Self Care | Payer: Medicare Other | Source: Ambulatory Visit | Attending: General Surgery | Admitting: General Surgery

## 2016-03-30 ENCOUNTER — Encounter (HOSPITAL_COMMUNITY): Payer: Self-pay

## 2016-03-30 ENCOUNTER — Other Ambulatory Visit: Payer: Self-pay | Admitting: Physician Assistant

## 2016-03-30 DIAGNOSIS — Z01812 Encounter for preprocedural laboratory examination: Secondary | ICD-10-CM | POA: Insufficient documentation

## 2016-03-30 DIAGNOSIS — K432 Incisional hernia without obstruction or gangrene: Secondary | ICD-10-CM | POA: Diagnosis not present

## 2016-03-30 HISTORY — DX: Anxiety disorder, unspecified: F41.9

## 2016-03-30 HISTORY — DX: Depression, unspecified: F32.A

## 2016-03-30 HISTORY — DX: Atherosclerotic heart disease of native coronary artery without angina pectoris: I25.10

## 2016-03-30 HISTORY — DX: Unspecified osteoarthritis, unspecified site: M19.90

## 2016-03-30 HISTORY — DX: Major depressive disorder, single episode, unspecified: F32.9

## 2016-03-30 HISTORY — DX: Gastro-esophageal reflux disease without esophagitis: K21.9

## 2016-03-30 LAB — COMPREHENSIVE METABOLIC PANEL
ALK PHOS: 37 U/L — AB (ref 38–126)
ALT: 19 U/L (ref 17–63)
AST: 36 U/L (ref 15–41)
Albumin: 3.6 g/dL (ref 3.5–5.0)
Anion gap: 11 (ref 5–15)
BUN: 8 mg/dL (ref 6–20)
CALCIUM: 9.2 mg/dL (ref 8.9–10.3)
CO2: 21 mmol/L — AB (ref 22–32)
CREATININE: 0.97 mg/dL (ref 0.61–1.24)
Chloride: 93 mmol/L — ABNORMAL LOW (ref 101–111)
Glucose, Bld: 97 mg/dL (ref 65–99)
Potassium: 4.6 mmol/L (ref 3.5–5.1)
Sodium: 125 mmol/L — ABNORMAL LOW (ref 135–145)
Total Bilirubin: 0.4 mg/dL (ref 0.3–1.2)
Total Protein: 6.7 g/dL (ref 6.5–8.1)

## 2016-03-30 LAB — CBC
HCT: 37.7 % — ABNORMAL LOW (ref 39.0–52.0)
HEMOGLOBIN: 13 g/dL (ref 13.0–17.0)
MCH: 33.2 pg (ref 26.0–34.0)
MCHC: 34.5 g/dL (ref 30.0–36.0)
MCV: 96.2 fL (ref 78.0–100.0)
Platelets: 177 10*3/uL (ref 150–400)
RBC: 3.92 MIL/uL — AB (ref 4.22–5.81)
RDW: 13.3 % (ref 11.5–15.5)
WBC: 7.5 10*3/uL (ref 4.0–10.5)

## 2016-03-30 NOTE — Progress Notes (Addendum)
PCP:Dr. Maudie Mercury in [place]  Cardiologist: Dr. McDowell--clearance in epic  Pt. reported he has a cold/cough for 1 week. Feels like he is better. Clear sputum, no fever. Instructed pt. If he gets worst to contact  Dr. Maudie Mercury.  Notified Dr. Johney Frame Office of pt's abnormal sodium level. Results given to [place].

## 2016-03-30 NOTE — Pre-Procedure Instructions (Signed)
    CORRIE TOMINAGA  03/30/2016      [facility] - [place], [address] [place] [address] Phone: [phone] Fax: [phone]    Your procedure is scheduled on Wed. Dec. 20  Report to [facility] Admitting at 9:00 A.M.  Call this number if you have problems the morning of surgery:  [phone]   Remember:  Do not eat food or drink liquids after midnight on Tues. Dec. 19   Take these medicines the morning of surgery with A SIP OF WATER : zyrtec, flonase nasal spray if needed, metoprolol succinate (toprol-xl), pantoprazole (protonix)             1 week prior to surgery stop aspirin, advil, motrin, ibuprofen, BC Powders, Goody's, vitamins/herbal medicines.    Do not wear jewelry.  Do not wear lotions, powders, or cologne, or deoderant.  Do not shave 48 hours prior to surgery.  Men may shave face and neck.  Do not bring valuables to the hospital.  [facility] is not responsible for any belongings or valuables.  Contacts, dentures or bridgework may not be worn into surgery.  Leave your suitcase in the car.  After surgery it may be brought to your room.  For patients admitted to the hospital, discharge time will be determined by your treatment team.  Patients discharged the day of surgery will not be allowed to drive home.   Name and phone number of your driver:    Special instructions:  Review preparing for surgery  Please read over the following fact sheets that you were given. Coughing and Deep Breathing and Surgical Site Infection Prevention

## 2016-03-31 NOTE — Progress Notes (Addendum)
Anesthesia Chart Review:  Pt is a 70 year old male scheduled for laparoscopic TEP incisional hernia repair with mesh on 04/05/2016 with Ralene Ok, MD.   PCP is Jani Gravel, MD.  - Cardiologist is Rozann Lesches, MD who cleared pt for surgery at last office visit 03/01/16.   PMH includes:  CAD (s/p CABG 08/20/15 (post-op course complicated by severe ileus resulting in ascending and transverse colectomy 08/25/15 and post-op afib now resolved), atrial flutter, PSVT, HTN, L subclavian artery stenosis (s/p stent 10/2014), hyperlipidemia, hx alcohol abuse, GERD. Former smoker. BMI 33.  Medications include: ASA, lisinopril, metoprolol, protonix, simvastatin.    Preoperative labs reviewed.  Na 125, Cl 93.  Abigail Butts in Dr. Johney Frame office was notified of low sodium. She reports [name] has instructed pt to restrict fluids to 1500 mL per day and to increase sodium in diet starting 04/03/16.  Will recheck DOS.   CXR 12/15/15: No acute disease. Mild bibasilar atelectasis versus scars more notable on the right. There may be a tiny right pleural effusion.  EKG 03/01/16: NSR. Inferior posterior infarct, age undetermined.   TEE 08/20/15:  - Left ventricle: Systolic function was mildly to moderately   reduced. The estimated ejection fraction was in the range of 40% to 45%. Diffuse hypokinesis. - Aortic valve: Cusp separation was mildly reduced. Right coronary cusp mobility was restricted. There was mild stenosis. There was trivial regurgitation originating from the central coaptation point. Valve area (VTI): 1.08 cm^2. Valve area (Vmax): 1.05 cm^2. Valve area (Vmean): 1.06 cm^2. - Mitral valve: Leaflet separation was reduced. There was mild regurgitation, with multiple jets directed centrally. Valve area by continuity equation (using LVOT flow): 1.9 cm^2. - Left atrium: No evidence of thrombus in the atrial cavity or appendage. - Atrial septum: No defect or patent foramen ovale was identified. - Tricuspid  valve: No evidence of vegetation. - Pulmonic valve: No evidence of vegetation.  Echo 08/12/15:  - Left ventricle: The cavity size was normal. Wall thickness was increased in a pattern of mild LVH. Systolic function was normal. The estimated ejection fraction was in the range of 60% to 65%. Features are consistent with a pseudonormal left ventricular filling pattern, with concomitant abnormal relaxation and increased filling pressure (grade 2 diastolic dysfunction). Doppler parameters are consistent with high ventricular filling pressure. - Regional wall motion abnormality: Mild hypokinesis of the mid inferior and mid inferolateral myocardium. - Aortic valve: Mildly to moderately calcified annulus. Mildly thickened, mildly calcified leaflets. There was mild stenosis. Peak velocity (S): 213 cm/s. Mean gradient (S): 9 mm Hg. - Mitral valve: Calcified annulus. There was mild regurgitation. - Left atrium: The atrium was mildly dilated.  Pt reported at PAT he had had a cold for a week but was feeling better. Denied fever, purulent sputum. PAT RN instructed pt to see PCP if sx worsened.   If labs acceptable DOS and pt's cold has resolved, I anticipate pt can proceed as scheduled.   Willeen Cass, FNP-BC [facility]/Anesthesiology Phone: [phone] 04/04/2016 1:49 PM

## 2016-04-01 ENCOUNTER — Other Ambulatory Visit: Payer: Self-pay | Admitting: Physician Assistant

## 2016-04-01 ENCOUNTER — Other Ambulatory Visit: Payer: Self-pay | Admitting: Adult Health

## 2016-04-03 NOTE — Telephone Encounter (Signed)
Dr. Maudie Mercury,   This prescription refill was sent to me, but it appears as though you have taken over care for him.    Thanks  [employer]

## 2016-04-05 ENCOUNTER — Encounter (HOSPITAL_COMMUNITY)
Admission: RE | Disposition: A | Discharge status: Home / Self Care | Payer: Self-pay | Source: Ambulatory Visit | Attending: General Surgery

## 2016-04-05 ENCOUNTER — Ambulatory Visit (HOSPITAL_COMMUNITY)
Admission: RE | Admit: 2016-04-05 | Discharge: 2016-04-05 | Disposition: A | Discharge status: Home / Self Care | Payer: Medicare Other | Source: Ambulatory Visit | Attending: General Surgery | Admitting: General Surgery

## 2016-04-05 ENCOUNTER — Ambulatory Visit (HOSPITAL_COMMUNITY): Payer: Medicare Other | Admitting: Vascular Surgery

## 2016-04-05 ENCOUNTER — Ambulatory Visit (HOSPITAL_COMMUNITY): Payer: Medicare Other | Admitting: Emergency Medicine

## 2016-04-05 ENCOUNTER — Encounter (HOSPITAL_COMMUNITY): Payer: Self-pay | Admitting: *Deleted

## 2016-04-05 DIAGNOSIS — K432 Incisional hernia without obstruction or gangrene: Secondary | ICD-10-CM | POA: Diagnosis present

## 2016-04-05 DIAGNOSIS — Z9049 Acquired absence of other specified parts of digestive tract: Secondary | ICD-10-CM | POA: Insufficient documentation

## 2016-04-05 DIAGNOSIS — I10 Essential (primary) hypertension: Secondary | ICD-10-CM | POA: Diagnosis not present

## 2016-04-05 DIAGNOSIS — Z87891 Personal history of nicotine dependence: Secondary | ICD-10-CM | POA: Insufficient documentation

## 2016-04-05 DIAGNOSIS — I739 Peripheral vascular disease, unspecified: Secondary | ICD-10-CM | POA: Diagnosis not present

## 2016-04-05 DIAGNOSIS — I2510 Atherosclerotic heart disease of native coronary artery without angina pectoris: Secondary | ICD-10-CM | POA: Diagnosis not present

## 2016-04-05 DIAGNOSIS — Z79899 Other long term (current) drug therapy: Secondary | ICD-10-CM | POA: Diagnosis not present

## 2016-04-05 DIAGNOSIS — Z951 Presence of aortocoronary bypass graft: Secondary | ICD-10-CM | POA: Diagnosis not present

## 2016-04-05 HISTORY — PX: INSERTION OF MESH: SHX5868

## 2016-04-05 HISTORY — PX: INCISIONAL HERNIA REPAIR: SHX193

## 2016-04-05 LAB — BASIC METABOLIC PANEL
ANION GAP: 10 (ref 5–15)
BUN: 5 mg/dL — ABNORMAL LOW (ref 6–20)
CALCIUM: 9.4 mg/dL (ref 8.9–10.3)
CO2: 22 mmol/L (ref 22–32)
Chloride: 94 mmol/L — ABNORMAL LOW (ref 101–111)
Creatinine, Ser: 0.94 mg/dL (ref 0.61–1.24)
GFR calc Af Amer: >60 mL/min (ref >60)
GFR calc non Af Amer: >60 mL/min (ref >60)
GLUCOSE: 99 mg/dL (ref 65–99)
Potassium: 4 mmol/L (ref 3.5–5.1)
Sodium: 126 mmol/L — ABNORMAL LOW (ref 135–145)

## 2016-04-05 SURGERY — REPAIR, HERNIA, INCISIONAL, LAPAROSCOPIC
Anesthesia: General | Site: Abdomen

## 2016-04-05 MED ORDER — PHENYLEPHRINE 40 MCG/ML (10ML) SYRINGE FOR IV PUSH (FOR BLOOD PRESSURE SUPPORT)
PREFILLED_SYRINGE | INTRAVENOUS | Status: AC
  Filled 2016-04-05: qty 10

## 2016-04-05 MED ORDER — SODIUM CHLORIDE 0.9 % IV SOLN: 250.0000 mL | INTRAVENOUS | Status: DC | PRN

## 2016-04-05 MED ORDER — ACETAMINOPHEN 10 MG/ML IV SOLN
INTRAVENOUS | Status: AC
  Filled 2016-04-05: qty 100

## 2016-04-05 MED ORDER — MORPHINE SULFATE (PF) 2 MG/ML IV SOLN: 2.0000 mg | INTRAVENOUS | Status: DC | PRN

## 2016-04-05 MED ORDER — ACETAMINOPHEN 325 MG PO TABS: 650.0000 mg | ORAL_TABLET | ORAL | Status: DC | PRN

## 2016-04-05 MED ORDER — EPHEDRINE SULFATE 50 MG/ML IJ SOLN
INTRAMUSCULAR | Status: DC | PRN
  Administered 2016-04-05: 5 mg via INTRAVENOUS
  Administered 2016-04-05: 10 mg via INTRAVENOUS
  Administered 2016-04-05 (×3): 5 mg via INTRAVENOUS

## 2016-04-05 MED ORDER — ACETAMINOPHEN 10 MG/ML IV SOLN
INTRAVENOUS | Status: DC | PRN
  Administered 2016-04-05: 1000 mg via INTRAVENOUS

## 2016-04-05 MED ORDER — FENTANYL CITRATE (PF) 100 MCG/2ML IJ SOLN
INTRAMUSCULAR | Status: DC | PRN
  Administered 2016-04-05: 100 ug via INTRAVENOUS

## 2016-04-05 MED ORDER — ONDANSETRON HCL 4 MG/2ML IJ SOLN
INTRAMUSCULAR | Status: DC | PRN
  Administered 2016-04-05: 4 mg via INTRAVENOUS

## 2016-04-05 MED ORDER — HYDROMORPHONE HCL 1 MG/ML IJ SOLN
INTRAMUSCULAR | Status: AC
  Filled 2016-04-05: qty 1

## 2016-04-05 MED ORDER — PROPOFOL 10 MG/ML IV BOLUS
INTRAVENOUS | Status: AC
  Filled 2016-04-05: qty 20

## 2016-04-05 MED ORDER — SUGAMMADEX SODIUM 200 MG/2ML IV SOLN
INTRAVENOUS | Status: DC | PRN
  Administered 2016-04-05: 200 mg via INTRAVENOUS

## 2016-04-05 MED ORDER — PROMETHAZINE HCL 25 MG/ML IJ SOLN: 6.2500 mg | INTRAMUSCULAR | Status: DC | PRN

## 2016-04-05 MED ORDER — SODIUM CHLORIDE 0.9% FLUSH: 3.0000 mL | INTRAVENOUS | Status: DC | PRN

## 2016-04-05 MED ORDER — BUPIVACAINE HCL (PF) 0.25 % IJ SOLN
INTRAMUSCULAR | Status: AC
  Filled 2016-04-05: qty 90

## 2016-04-05 MED ORDER — BUPIVACAINE HCL 0.25 % IJ SOLN
INTRAMUSCULAR | Status: DC | PRN
  Administered 2016-04-05: 4 mL

## 2016-04-05 MED ORDER — OXYCODONE HCL 5 MG PO TABS
ORAL_TABLET | ORAL | Status: AC
  Filled 2016-04-05: qty 2

## 2016-04-05 MED ORDER — PHENYLEPHRINE HCL 10 MG/ML IJ SOLN
INTRAMUSCULAR | Status: DC | PRN
  Administered 2016-04-05 (×5): 80 ug via INTRAVENOUS

## 2016-04-05 MED ORDER — CHLORHEXIDINE GLUCONATE CLOTH 2 % EX PADS: 6.0000 | MEDICATED_PAD | Freq: Once | CUTANEOUS | Status: DC

## 2016-04-05 MED ORDER — ROCURONIUM BROMIDE 50 MG/5ML IV SOSY
PREFILLED_SYRINGE | INTRAVENOUS | Status: AC
  Filled 2016-04-05: qty 5

## 2016-04-05 MED ORDER — LACTATED RINGERS IV SOLN
INTRAVENOUS | Status: DC
  Administered 2016-04-05 (×2): via INTRAVENOUS

## 2016-04-05 MED ORDER — SODIUM CHLORIDE 0.9% FLUSH: 3.0000 mL | Freq: Two times a day (BID) | INTRAVENOUS | Status: DC

## 2016-04-05 MED ORDER — LIDOCAINE HCL (CARDIAC) 20 MG/ML IV SOLN
INTRAVENOUS | Status: DC | PRN
  Administered 2016-04-05: 80 mg via INTRAVENOUS

## 2016-04-05 MED ORDER — FENTANYL CITRATE (PF) 100 MCG/2ML IJ SOLN
INTRAMUSCULAR | Status: AC
  Filled 2016-04-05: qty 2

## 2016-04-05 MED ORDER — 0.9 % SODIUM CHLORIDE (POUR BTL) OPTIME
TOPICAL | Status: DC | PRN
  Administered 2016-04-05: 1000 mL

## 2016-04-05 MED ORDER — LIDOCAINE 2% (20 MG/ML) 5 ML SYRINGE
INTRAMUSCULAR | Status: AC
  Filled 2016-04-05: qty 5

## 2016-04-05 MED ORDER — OXYCODONE-ACETAMINOPHEN 5-325 MG PO TABS: 1.0000 | ORAL_TABLET | ORAL | 0 refills | Status: DC | PRN

## 2016-04-05 MED ORDER — HYDROMORPHONE HCL 1 MG/ML IJ SOLN
INTRAMUSCULAR | Status: DC | PRN
  Administered 2016-04-05: 1 mg via INTRAVENOUS

## 2016-04-05 MED ORDER — HYDROMORPHONE HCL 1 MG/ML IJ SOLN
0.2500 mg | INTRAMUSCULAR | Status: DC | PRN
  Administered 2016-04-05 (×4): 0.5 mg via INTRAVENOUS

## 2016-04-05 MED ORDER — HYDROMORPHONE HCL 1 MG/ML IJ SOLN
INTRAMUSCULAR | Status: AC
  Filled 2016-04-05: qty 2

## 2016-04-05 MED ORDER — ONDANSETRON HCL 4 MG/2ML IJ SOLN
INTRAMUSCULAR | Status: AC
  Filled 2016-04-05: qty 2

## 2016-04-05 MED ORDER — ROCURONIUM BROMIDE 100 MG/10ML IV SOLN
INTRAVENOUS | Status: DC | PRN
  Administered 2016-04-05: 50 mg via INTRAVENOUS
  Administered 2016-04-05 (×2): 10 mg via INTRAVENOUS

## 2016-04-05 MED ORDER — PROPOFOL 10 MG/ML IV BOLUS
INTRAVENOUS | Status: DC | PRN
  Administered 2016-04-05: 200 mg via INTRAVENOUS

## 2016-04-05 MED ORDER — OXYCODONE HCL 5 MG PO TABS
5.0000 mg | ORAL_TABLET | ORAL | Status: DC | PRN
  Administered 2016-04-05: 10 mg via ORAL

## 2016-04-05 MED ORDER — EPHEDRINE 5 MG/ML INJ
INTRAVENOUS | Status: AC
  Filled 2016-04-05: qty 10

## 2016-04-05 MED ORDER — ACETAMINOPHEN 650 MG RE SUPP: 650.0000 mg | RECTAL | Status: DC | PRN

## 2016-04-05 MED ORDER — CEFAZOLIN SODIUM-DEXTROSE 2-4 GM/100ML-% IV SOLN
2.0000 g | INTRAVENOUS | Status: AC
  Administered 2016-04-05: 2 g via INTRAVENOUS
  Filled 2016-04-05: qty 100

## 2016-04-05 SURGICAL SUPPLY — 55 items
APPLIER CLIP LOGIC TI 5 (MISCELLANEOUS) IMPLANT
BENZOIN TINCTURE PRP APPL 2/3 (GAUZE/BANDAGES/DRESSINGS) ×3 IMPLANT
BINDER ABDOMINAL 12 ML 46-62 (SOFTGOODS) ×3 IMPLANT
BLADE SURG ROTATE 9660 (MISCELLANEOUS) IMPLANT
CANISTER SUCTION 2500CC (MISCELLANEOUS) IMPLANT
CHLORAPREP W/TINT 26ML (MISCELLANEOUS) ×3 IMPLANT
CLOSURE WOUND 1/2 X4 (GAUZE/BANDAGES/DRESSINGS) ×1
COVER SURGICAL LIGHT HANDLE (MISCELLANEOUS) ×3 IMPLANT
DECANTER SPIKE VIAL GLASS SM (MISCELLANEOUS) ×3 IMPLANT
DEVICE RELIATACK FIXATION (MISCELLANEOUS) IMPLANT
DEVICE SECURE STRAP 25 ABSORB (INSTRUMENTS) ×3 IMPLANT
DEVICE TROCAR PUNCTURE CLOSURE (ENDOMECHANICALS) ×3 IMPLANT
DRAPE LAPAROSCOPIC ABDOMINAL (DRAPES) ×3 IMPLANT
ELECT REM PT RETURN 9FT ADLT (ELECTROSURGICAL) ×3
ELECTRODE REM PT RTRN 9FT ADLT (ELECTROSURGICAL) ×1 IMPLANT
GAUZE SPONGE 2X2 8PLY STRL LF (GAUZE/BANDAGES/DRESSINGS) ×2 IMPLANT
GLOVE BIO SURGEON STRL SZ7.5 (GLOVE) ×3 IMPLANT
GLOVE BIOGEL PI IND STRL 7.0 (GLOVE) ×2 IMPLANT
GLOVE BIOGEL PI INDICATOR 7.0 (GLOVE) ×4
GLOVE SURG SS PI 6.5 STRL IVOR (GLOVE) ×3 IMPLANT
GOWN STRL REUS W/ TWL LRG LVL3 (GOWN DISPOSABLE) ×2 IMPLANT
GOWN STRL REUS W/ TWL XL LVL3 (GOWN DISPOSABLE) ×1 IMPLANT
GOWN STRL REUS W/TWL LRG LVL3 (GOWN DISPOSABLE) ×4
GOWN STRL REUS W/TWL XL LVL3 (GOWN DISPOSABLE) ×2
KIT BASIN OR (CUSTOM PROCEDURE TRAY) ×3 IMPLANT
KIT ROOM TURNOVER OR (KITS) ×3 IMPLANT
MARKER SKIN DUAL TIP RULER LAB (MISCELLANEOUS) IMPLANT
MESH VENTRALIGHT ST 8IN CRC (Mesh General) ×3 IMPLANT
NEEDLE INSUFFLATION 14GA 120MM (NEEDLE) ×3 IMPLANT
NEEDLE SPNL 22GX3.5 QUINCKE BK (NEEDLE) IMPLANT
NS IRRIG 1000ML POUR BTL (IV SOLUTION) ×3 IMPLANT
PAD ARMBOARD 7.5X6 YLW CONV (MISCELLANEOUS) ×6 IMPLANT
RELOAD RELIATACK 10 (MISCELLANEOUS) IMPLANT
RELOAD RELIATACK 5 (MISCELLANEOUS) IMPLANT
SCALPEL HARMONIC ACE (MISCELLANEOUS) IMPLANT
SCISSORS LAP 5X35 DISP (ENDOMECHANICALS) ×6 IMPLANT
SET IRRIG TUBING LAPAROSCOPIC (IRRIGATION / IRRIGATOR) IMPLANT
SLEEVE ENDOPATH XCEL 5M (ENDOMECHANICALS) ×9 IMPLANT
SPONGE GAUZE 2X2 STER 10/PKG (GAUZE/BANDAGES/DRESSINGS) ×4
STRIP CLOSURE SKIN 1/2X4 (GAUZE/BANDAGES/DRESSINGS) ×2 IMPLANT
SUT CHROMIC 2 0 SH (SUTURE) ×3 IMPLANT
SUT MNCRL AB 3-0 PS2 18 (SUTURE) ×3 IMPLANT
SUT NOVA NAB DX-16 0-1 5-0 T12 (SUTURE) IMPLANT
SUT NOVA NAB GS-21 0 18 T12 DT (SUTURE) ×6 IMPLANT
SUT PROLENE 2 0 KS (SUTURE) IMPLANT
SUT VLOC 180 0 9IN  GS21 (SUTURE) ×2
SUT VLOC 180 0 9IN GS21 (SUTURE) ×1 IMPLANT
SUT VLOC 180 2-0 9IN GS21 (SUTURE) ×6 IMPLANT
TOWEL OR 17X24 6PK STRL BLUE (TOWEL DISPOSABLE) ×3 IMPLANT
TOWEL OR 17X26 10 PK STRL BLUE (TOWEL DISPOSABLE) ×3 IMPLANT
TRAY FOLEY CATH 16FR SILVER (SET/KITS/TRAYS/PACK) IMPLANT
TRAY LAPAROSCOPIC MC (CUSTOM PROCEDURE TRAY) ×3 IMPLANT
TROCAR XCEL NON-BLD 11X100MML (ENDOMECHANICALS) IMPLANT
TROCAR XCEL NON-BLD 5MMX100MML (ENDOMECHANICALS) ×3 IMPLANT
TUBING INSUFFLATION (TUBING) ×3 IMPLANT

## 2016-04-05 NOTE — Anesthesia Preprocedure Evaluation (Signed)
Anesthesia Evaluation  Patient identified by MRN, date of birth, ID band Patient awake    Reviewed: Allergy & Precautions, NPO status , Patient's Chart, lab work & pertinent test results  Airway Mallampati: II  TM Distance: >3 FB Neck ROM: Full    Dental no notable dental hx.    Pulmonary neg pulmonary ROS, former smoker,    Pulmonary exam normal breath sounds clear to auscultation       Cardiovascular hypertension, + CAD, + CABG and + Peripheral Vascular Disease  Normal cardiovascular exam Rhythm:Regular Rate:Normal  EF 40-45%   Neuro/Psych negative neurological ROS  negative psych ROS   GI/Hepatic negative GI ROS, (+)     substance abuse  alcohol use,   Endo/Other  negative endocrine ROS  Renal/GU negative Renal ROS  negative genitourinary   Musculoskeletal negative musculoskeletal ROS (+)   Abdominal   Peds negative pediatric ROS (+)  Hematology negative hematology ROS (+)   Anesthesia Other Findings   Reproductive/Obstetrics negative OB ROS                             Anesthesia Physical Anesthesia Plan  ASA: III  Anesthesia Plan: General   Post-op Pain Management:    Induction: Intravenous  Airway Management Planned: Oral ETT  Additional Equipment:   Intra-op Plan:   Post-operative Plan: Possible Post-op intubation/ventilation  Informed Consent: I have reviewed the patients History and Physical, chart, labs and discussed the procedure including the risks, benefits and alternatives for the proposed anesthesia with the patient or authorized representative who has indicated his/her understanding and acceptance.   Dental advisory given  Plan Discussed with: CRNA and Surgeon  Anesthesia Plan Comments: (Failed exubation after ex-lap)        Anesthesia Quick Evaluation

## 2016-04-05 NOTE — Transfer of Care (Signed)
Immediate Anesthesia Transfer of Care Note  Patient: Victor Diaz  Procedure(s) Performed: Procedure(s): LAPAROSCOPIC TEP INCISIONAL HERNIA REPAIR WITH MESH (N/A) INSERTION OF MESH (N/A)  Patient Location: PACU  Anesthesia Type:General  Level of Consciousness: awake, alert  and oriented  Airway & Oxygen Therapy: Patient Spontanous Breathing and Patient connected to nasal cannula oxygen  Post-op Assessment: Report given to RN and Post -op Vital signs reviewed and stable  Post vital signs: Reviewed and stable  Last Vitals:  Vitals:   04/05/16 1246 04/05/16 1300  BP: 109/63 (!) 112/59  Pulse: 98 88  Resp: (!) 24 (!) 23  Temp: 36.4 C     Last Pain:  Vitals:   04/05/16 1316  TempSrc:   PainSc: 7          Complications: No apparent anesthesia complications

## 2016-04-05 NOTE — Discharge Instructions (Signed)
[facility] _______[facility] ° °HERNIA REPAIR: POST OP INSTRUCTIONS ° °Always review your discharge instruction sheet given to you by the facility where your surgery was performed. °IF YOU HAVE DISABILITY OR FAMILY LEAVE FORMS, YOU MUST BRING THEM TO THE OFFICE FOR PROCESSING.   °DO NOT GIVE THEM TO YOUR DOCTOR. ° °1. A  prescription for pain medication may be given to you upon discharge.  Take your pain medication as prescribed, if needed.  If narcotic pain medicine is not needed, then you may take acetaminophen (Tylenol) or ibuprofen (Advil) as needed. °2. Take your usually prescribed medications unless otherwise directed. °If you need a refill on your pain medication, please contact your pharmacy.  They will contact our office to request authorization. Prescriptions will not be filled after 5 pm or on week-ends. °3. You should follow a light diet the first 24 hours after arrival home, such as soup and crackers, etc.  Be sure to include lots of fluids daily.  Resume your normal diet the day after surgery. °4.Most patients will experience some swelling and bruising around the umbilicus or in the groin and scrotum.  Ice packs and reclining will help.  Swelling and bruising can take several days to resolve.  °6. It is common to experience some constipation if taking pain medication after surgery.  Increasing fluid intake and taking a stool softener (such as Colace) will usually help or prevent this problem from occurring.  A mild laxative (Milk of Magnesia or Miralax) should be taken according to package directions if there are no bowel movements after 48 hours. °7. Unless discharge instructions indicate otherwise, you may remove your bandages 24-48 hours after surgery, and you may shower at that time.  You may have steri-strips (small skin tapes) in place directly over the incision.  These strips should be left on the skin for 7-10 days.  If your surgeon used skin glue on the incision, you may shower in  24 hours.  The glue will flake off over the next 2-3 weeks.  Any sutures or staples will be removed at the office during your follow-up visit. °8. ACTIVITIES:  You may resume regular (light) daily activities beginning the next day--such as daily self-care, walking, climbing stairs--gradually increasing activities as tolerated.  You may have sexual intercourse when it is comfortable.  Refrain from any heavy lifting or straining until approved by your doctor. ° °a.You may drive when you are no longer taking prescription pain medication, you can comfortably wear a seatbelt, and you can safely maneuver your car and apply brakes. °b.RETURN TO WORK:   °_____________________________________________ ° °9.You should see your doctor in the office for a follow-up appointment approximately 2-3 weeks after your surgery.  Make sure that you call for this appointment within a day or two after you arrive home to insure a convenient appointment time. °10.OTHER INSTRUCTIONS: _________________________ °   _____________________________________ ° °WHEN TO CALL YOUR DOCTOR: °1. Fever over 101.0 °2. Inability to urinate °3. Nausea and/or vomiting °4. Extreme swelling or bruising °5. Continued bleeding from incision. °6. Increased pain, redness, or drainage from the incision ° °The clinic staff is available to answer your questions during regular business hours.  Please don’t hesitate to call and ask to speak to one of the nurses for clinical concerns.  If you have a medical emergency, go to the nearest emergency room or call 911.  A surgeon from [facility] is always on call at the hospital ° ° °[address], [address], [place], [place]  [address] ? ° [address] [address], [place], [place]   [address] °[phone] ? [phone] ? FAX [phone] °Web site: [email or web address] ° °

## 2016-04-05 NOTE — Anesthesia Procedure Notes (Signed)
Procedure Name: Intubation Date/Time: 04/05/2016 10:06 AM Performed by: Manuela Schwartz B Pre-anesthesia Checklist: Patient identified, Emergency Drugs available, Suction available, Patient being monitored and Timeout performed Patient Re-evaluated:Patient Re-evaluated prior to inductionOxygen Delivery Method: Circle system utilized Preoxygenation: Pre-oxygenation with 100% oxygen Intubation Type: IV induction Ventilation: Mask ventilation without difficulty Laryngoscope Size: Mac and 3 Grade View: Grade I Tube type: Oral Tube size: 7.5 mm Number of attempts: 1 Airway Equipment and Method: Stylet Placement Confirmation: ETT inserted through vocal cords under direct vision,  positive ETCO2 and breath sounds checked- equal and bilateral Secured at: 23 cm Tube secured with: Tape Dental Injury: Teeth and Oropharynx as per pre-operative assessment

## 2016-04-05 NOTE — Op Note (Signed)
04/05/2016  12:36 PM  PATIENT:  Victor Diaz  70 y.o. male  PRE-OPERATIVE DIAGNOSIS:  Incisional herina  POST-OPERATIVE DIAGNOSIS:  Incisional herina  PROCEDURE:  Procedure(s): LAPAROSCOPIC TEP INCISIONAL HERNIA REPAIR WITH MESH (N/A) INSERTION OF MESH (N/A)  SURGEON:  Surgeon(s) and Role:    * Ralene Ok, MD - Primary  ANESTHESIA:   local and general  EBL:  Total I/O In: 1600 [I.V.:1600] Out: 30 [Blood:30]  BLOOD ADMINISTERED:none  DRAINS: none   LOCAL MEDICATIONS USED:  BUPIVICAINE   SPECIMEN:  No Specimen  DISPOSITION OF SPECIMEN:  N/A  COUNTS:  YES  TOURNIQUET:  * No tourniquets in log *  DICTATION: .Dragon Dictation  Details of the procedure:  After the patient was consented he was taken back to the OR and placed in the supine position with bilateral SCDs in place.  The patient was prepped and draped in the standard fashion.  A time out was called and all facts were verified.    An optiview technique was used to obtain prepreitoneal access in the left upper quadrants.  Insufflation was begun.  At this time a scope was used to make a space in the retrorectus space bluntly.  A space was created in the left lower quadrant.  A 54mm trocar was placed under direct visualization.  A this time the left retrorectus space was dissected away and the linea alba was visualized.  This was incised just superficial to the falciform ligament.  I was able to visualize the right posterior rectus sheath.  This was incised, and the posterior rectus sheath was taken down.  This was dissected down towards the area of the hernia.  To help with visualization, a 24mm camera port was placed in the epigastrum.    At this time a 0 V lock And was used to reapproximate the midline fascia. The hernia sac was incorporated. Secondary to the fact that the suture was not long enough to run the entire length of the diastases O Novafils or use an Endo Close device was used to reapproximate the  midline.  At this time a 2-0 V lock was used in a running fashion to reapproximate the peritoneum posteriorly. This was done from each end. Secondary to the thin posterior rectus sheath 0 continued to rip. At this time a ST light 20 cm mesh was then introduced the abdomen. This part of the anterior abdominal wall. It was tacked to the posterior muscle layer with a secure strap.  It should be noted that the posterior midline rectus sheath was unable to be reapproximated secondary to the thinness of the posterior rectus sheath.  At this time the insufflation was evacuated direct visualization. The trochars were all removed as was insufflation. The skin was reapproximated trocar site using 4-0 Monocryl subcuticular fashion. The skin was dressed with Steri-Strips gauze and tape. The patient was taken to the PACU in stable condition.    PLAN OF CARE: Discharge to home after PACU  PATIENT DISPOSITION:  PACU - hemodynamically stable.   Delay start of Pharmacological VTE agent (>24hrs) due to surgical blood loss or risk of bleeding: not applicable

## 2016-04-05 NOTE — H&P (Signed)
History of Present Illness  The patient is a 70 year old male who presents with an incisional hernia. 70 year old male who presented been seen secondary to a cecal volvulus. Patient underwent right hemicolectomy. This occurred post CABG. Patient been doing well postoperatively. He states that recently in the last 2 months he noticed a bulge in the upper portion of his incision. He states he does have some soreness and pain area. He's had no signs or symptoms of incarceration or strangulation. There is no exacerbating or relieving factors.  Patient sees Dr. Domenic Polite is his cardiologist.   Allergies Atorvastatin Calcium *ANTIHYPERLIPIDEMICS*  Medication History LORazepam (0.5MG  Tablet, Oral as needed) Active. Furosemide (40MG  Tablet, Oral) Active. Amiodarone HCl (200MG  Tablet, Oral) Active. Omeprazole (20MG  Capsule DR, Oral) Active. Nitroglycerin (0.4MG  Tab Sublingual, Sublingual as needed) Active. Metoprolol Succinate ER (25MG  Tablet ER 24HR, Oral) Active. Simvastatin (40MG  Tablet, Oral) Active. Propranolol HCl (20MG  Tablet, Oral) Active. Pantoprazole Sodium (40MG  Tablet DR, Oral) Active. Lisinopril (2.5MG  Tablet, Oral) Active. Medications Reconciled  BP 137/64   Pulse 77   Temp 97.8 F (36.6 C) (Oral)   Resp 20   Wt 92.1 kg (203 lb)   SpO2 96%   BMI 32.77 kg/m    Physical Exam General Mental Status-Alert. General Appearance-Consistent with stated age. Hydration-Well hydrated. Voice-Normal.  Head and Neck Head-normocephalic, atraumatic with no lesions or palpable masses. Trachea-midline. Thyroid Gland Characteristics - normal size and consistency.  Chest and Lung Exam Chest and lung exam reveals -quiet, even and easy respiratory effort with no use of accessory muscles and on auscultation, normal breath sounds, no adventitious sounds and normal vocal resonance. Inspection Chest Wall - Normal. Back -  normal.  Cardiovascular Cardiovascular examination reveals -normal heart sounds, regular rate and rhythm with no murmurs and normal pedal pulses bilaterally.  Abdomen Inspection Skin - Scar - no surgical scars. Hernias - Incisional - Reducible(Upper midline incisional hernia). Palpation/Percussion Normal exam - Soft, Non Tender, No Rebound tenderness, No Rigidity (guarding) and No hepatosplenomegaly. Auscultation Normal exam - Bowel sounds normal.    Assessment & Plan  INCISIONAL HERNIA, WITHOUT OBSTRUCTION OR GANGRENE (K43.2) Impression: 70 year old male with an incisional hernia this upper midline incision.  1. The patient sees Dr. Domenic Polite is his cardiologist. We will obtain cardiac clearance prior to scheduling surgery 2. The patient will like to proceed to the operating room for laparoscopic TEP incisional hernia repair with mesh.  3. I discussed with the patient the signs and symptoms of incarceration and strangulation and the need to proceed to the ER should they occur.  4. I discussed with the patient the risks and benefits of the procedure to include but not limited to: Infection, bleeding, damage to surrounding structures, possible need for further surgery, possible nerve pain, and possible recurrence. The patient was understanding and wishes to proceed.

## 2016-04-06 NOTE — Anesthesia Postprocedure Evaluation (Signed)
Anesthesia Post Note  Patient: [name]  Procedure(s) Performed: Procedure(s) (LRB): LAPAROSCOPIC TEP INCISIONAL HERNIA REPAIR WITH MESH (N/A) INSERTION OF MESH (N/A)  Patient location during evaluation: PACU Anesthesia Type: General Level of consciousness: awake and alert Pain management: pain level controlled Vital Signs Assessment: post-procedure vital signs reviewed and stable Respiratory status: spontaneous breathing, nonlabored ventilation, respiratory function stable and patient connected to nasal cannula oxygen Cardiovascular status: blood pressure returned to baseline and stable Postop Assessment: no signs of nausea or vomiting Anesthetic complications: no       Last Vitals:  Vitals:   04/05/16 1343 04/05/16 1402  BP: 123/61 115/67  Pulse: 86 99  Resp: 15 18  Temp: 36.4 C     Last Pain:  Vitals:   04/05/16 1343  TempSrc:   PainSc: Asleep                 Gershom Brobeck

## 2016-04-12 ENCOUNTER — Encounter (HOSPITAL_COMMUNITY): Payer: Self-pay | Admitting: General Surgery

## 2016-05-05 ENCOUNTER — Other Ambulatory Visit: Payer: Self-pay | Admitting: Physician Assistant

## 2016-05-05 ENCOUNTER — Other Ambulatory Visit: Payer: Self-pay | Admitting: Cardiology

## 2016-05-22 ENCOUNTER — Other Ambulatory Visit: Payer: Self-pay | Admitting: Cardiology

## 2016-05-22 MED ORDER — METOPROLOL SUCCINATE ER 25 MG PO TB24: ORAL_TABLET | ORAL | 1 refills | Status: DC

## 2016-05-22 NOTE — Telephone Encounter (Signed)
Refill:   metoprolol succinate (TOPROL-XL) 25 MG

## 2016-05-22 NOTE — Telephone Encounter (Signed)
Medication sent to pharmacy,

## 2016-05-23 ENCOUNTER — Other Ambulatory Visit: Payer: Self-pay | Admitting: Cardiology

## 2016-05-29 ENCOUNTER — Telehealth: Payer: Self-pay | Admitting: Cardiology

## 2016-05-29 MED ORDER — PANTOPRAZOLE SODIUM 40 MG PO TBEC: 40.0000 mg | DELAYED_RELEASE_TABLET | Freq: Every day | ORAL | 0 refills | Status: DC

## 2016-05-29 NOTE — Telephone Encounter (Signed)
Fwd to Dr. Domenic Polite for approval.

## 2016-05-29 NOTE — Telephone Encounter (Signed)
We can do a limited refill for Protonix. If his PCP has truly left, he needs to establish with a new one to maintain medications going forward.

## 2016-05-29 NOTE — Telephone Encounter (Signed)
Patient notified

## 2016-06-16 ENCOUNTER — Encounter: Payer: Self-pay | Admitting: Surgery

## 2016-06-26 ENCOUNTER — Encounter (HOSPITAL_COMMUNITY): Payer: Medicare Other

## 2016-06-26 ENCOUNTER — Ambulatory Visit: Payer: Medicare Other | Admitting: Surgery

## 2016-07-27 ENCOUNTER — Encounter: Payer: Self-pay | Admitting: Surgery

## 2016-08-02 ENCOUNTER — Encounter: Payer: Self-pay | Admitting: Orthopaedic Surgery

## 2016-08-02 ENCOUNTER — Ambulatory Visit (INDEPENDENT_AMBULATORY_CARE_PROVIDER_SITE_OTHER): Payer: Medicare Other | Admitting: Orthopaedic Surgery

## 2016-08-02 ENCOUNTER — Ambulatory Visit (INDEPENDENT_AMBULATORY_CARE_PROVIDER_SITE_OTHER): Payer: Medicare Other

## 2016-08-02 VITALS — BP 138/58 | HR 86 | Temp 97.3°F | Ht 66.0 in | Wt 212.0 lb

## 2016-08-02 DIAGNOSIS — M545 Low back pain, unspecified: Secondary | ICD-10-CM

## 2016-08-02 DIAGNOSIS — G8929 Other chronic pain: Secondary | ICD-10-CM

## 2016-08-02 NOTE — Progress Notes (Signed)
Subjective:    Patient ID: Victor Diaz, male    DOB: [birth year], 71 y.o.   MRN: [id number]  HPI He has long history of lower back pain, more in the 1980's and 1990's.  Last year he had three surgeries, the heart, the colon and hernia.  He has been less active.  He has pain now in the lower back with some pain in the lower thoracic area with no paresthesias.  He has no trauma. He has pain when standing for a while.  He has no spasm, no weakness, no bowel or bladder problem.  He has taken Advil three tablets two times a day which help. He has not used heat or ice.  He tires easily.  He is concerned that the back pain is getting worse.   Review of Systems  HENT: Negative for congestion.   Respiratory: Positive for shortness of breath. Negative for cough.   Cardiovascular: Negative for chest pain and leg swelling.  Endocrine: Positive for cold intolerance.  Musculoskeletal: Positive for arthralgias, back pain and gait problem.  Allergic/Immunologic: Positive for environmental allergies.  Psychiatric/Behavioral: The patient is nervous/anxious.    Past Medical History:  Diagnosis Date  . Alcohol abuse   . Allergy   . Anxiety   . Arthritis   . Complication of anesthesia    GI motility sluggish (bowels got backed up)  . Coronary artery disease   . Depression   . Essential hypertension   . GERD (gastroesophageal reflux disease)   . Gout   . Hemothorax on right   . Hyperlipidemia   . Lisfranc's dislocation 10/20/2011  . Paroxysmal atrial flutter (HCC)    Poor anticoagulation candidate with active alcohol abuse and syncope  . PSVT (paroxysmal supraventricular tachycardia) ([place])   . Right fibular fracture 10/20/2011  . Subclavian artery stenosis, left (HCC)    Status post stent placement by Dr. Trula Slade 10/2014    Past Surgical History:  Procedure Laterality Date  . APPENDECTOMY    . CARDIAC CATHETERIZATION N/A 08/19/2015   Procedure: Left Heart Cath and Cors/Grafts Angiography;   Surgeon: Jettie Booze, MD;  Location: [facility];  Service: Cardiovascular;  Laterality: N/A;  . CORONARY ARTERY BYPASS GRAFT N/A 08/20/2015   Procedure: CORONARY ARTERY BYPASS GRAFTING (CABG) TIMES FOUR  UTILIZING THE LEFT INTERNAL MAMMARY ARTERY AND ENDOSCOPICALLY HARVESTED RIGHT SAPHENEOUS VEINS. CLIPPING OF ATRIAL APPENDAGE;  Surgeon: Ivin Poot, MD;  Location: [facility];  Service: Open Heart Surgery;  Laterality: N/A;  . HERNIA REPAIR    . INCISIONAL HERNIA REPAIR N/A 04/05/2016   Procedure: LAPAROSCOPIC TEP INCISIONAL HERNIA REPAIR WITH MESH;  Surgeon: Ralene Ok, MD;  Location: [facility];  Service: General;  Laterality: N/A;  . INSERTION OF MESH N/A 04/05/2016   Procedure: INSERTION OF MESH;  Surgeon: Ralene Ok, MD;  Location: [facility];  Service: General;  Laterality: N/A;  . LAPAROTOMY N/A 08/25/2015   Procedure: EXPLORATORY LAPAROTOMY;  Surgeon: Ralene Ok, MD;  Location: [facility];  Service: General;  Laterality: N/A;  . PARTIAL COLECTOMY N/A 08/25/2015   Procedure: ASCENDING AND TRANSVERSE COLECTOMY;  Surgeon: Ralene Ok, MD;  Location: [facility];  Service: General;  Laterality: N/A;  . PARTIAL HIP ARTHROPLASTY Left 2010  . PERIPHERAL VASCULAR CATHETERIZATION N/A 11/11/2014   Procedure: Aortic Arch Angiography/Left Subclavion Stent;  Surgeon: Serafina Mitchell, MD;  Location: [facility];  Service: Cardiovascular;  Laterality: N/A;  . REVISION TOTAL HIP ARTHROPLASTY  08/28/2008  . TEE  WITHOUT CARDIOVERSION N/A 08/20/2015   Procedure: TRANSESOPHAGEAL ECHOCARDIOGRAM (TEE);  Surgeon: Ivin Poot, MD;  Location: [facility];  Service: Open Heart Surgery;  Laterality: N/A;  . TONSILECTOMY, ADENOIDECTOMY, BILATERAL MYRINGOTOMY AND TUBES      Current Outpatient Prescriptions on File Prior to Visit  Medication Sig Dispense Refill  . aspirin 325 MG tablet Take 1 tablet (325 mg total) by mouth daily.    Marland Kitchen docusate sodium (COLACE) 100 MG capsule Take 2 capsules (200 mg total)  by mouth daily. (Patient taking differently: Take 100 mg by mouth daily as needed for mild constipation. ) 10 capsule 0  . lisinopril (PRINIVIL,ZESTRIL) 2.5 MG tablet Take 1 tablet (2.5 mg total) by mouth daily. 30 tablet 6  . metoprolol succinate (TOPROL-XL) 25 MG 24 hr tablet Take 0.5 tablets (12.5 mg total) by mouth daily. 45 tablet 1  . pantoprazole (PROTONIX) 40 MG tablet Take 1 tablet (40 mg total) by mouth daily. 90 tablet 0  . simvastatin (ZOCOR) 20 MG tablet TAKE (1) TABLET BY MOUTH AT BEDTIME. 30 tablet 6  . cetirizine (ZYRTEC) 10 MG tablet TAKE ONE TABLET BY MOUTH ONCE DAILY AS NEEDED. 60 tablet 0   No current facility-administered medications on file prior to visit.     Social History   Social History  . Marital status: Widowed    Spouse name: N/A  . Number of children: N/A  . Years of education: N/A   Occupational History  . Not on file.   Social History Main Topics  . Smoking status: Former Smoker    Packs/day: 1.00    Years: 10.00    Types: Cigarettes    Start date: 11/27/1957    Quit date: 08/09/1969  . Smokeless tobacco: Never Used  . Alcohol use 7.2 oz/week    12 Cans of beer per week     Comment: occasionally  . Drug use: No  . Sexual activity: Not on file   Other Topics Concern  . Not on file   Social History Narrative   Lives in [place],  Has two sons who live with him   He is no longer working - lost his license.    He does not have a valid driver's license because of alcohol-related in fractions      Son drives him ;has MS          Family History  Problem Relation Age of Onset  . Arthritis Mother   . Cerebral aneurysm Mother   . Tuberculosis Father     BP (!) 138/58   Pulse 86   Temp 97.3 F (36.3 C)   Ht 5\' 6"  (1.676 m)   Wt 212 lb (96.2 kg)   BMI 34.22 kg/m       Objective:   Physical Exam  Constitutional: He is oriented to person, place, and time. He appears well-developed and well-nourished.  HENT:  Head: Normocephalic  and atraumatic.  Eyes: Conjunctivae and EOM are normal. Pupils are equal, round, and reactive to light.  Neck: Normal range of motion. Neck supple.  Cardiovascular: Normal rate, regular rhythm and intact distal pulses.   Pulmonary/Chest: Effort normal.  Abdominal: Soft.  Musculoskeletal: He exhibits tenderness (Lumber spine slightly tender, flexion to 45 degrees, no spasm, lateral bend OK, NV intact, gait normal, no weakness.).  Neurological: He is alert and oriented to person, place, and time. He has normal reflexes. He displays normal reflexes. No cranial nerve deficit. He exhibits normal muscle tone. Coordination normal.  Skin:  Skin is warm and dry.  Psychiatric: He has a normal mood and affect. His behavior is normal. Judgment and thought content normal.  Vitals reviewed.    X-rays of the lumbar spine were done, reported separately.     Assessment & Plan:   Encounter Diagnosis  Name Primary?  . Chronic bilateral low back pain without sciatica Yes   I will have him seen by PT.  I feel he is deconditioned and they can work with that as well.  Return in one month.  Call if any problem.  Continue the Advil.  Electronically Signed Sanjuana Kava, MD 4/18/20182:17 PM

## 2016-08-02 NOTE — Patient Instructions (Addendum)
Physical therapy request sent to [facility].  The patient was told to expect their call.

## 2016-08-04 ENCOUNTER — Other Ambulatory Visit: Payer: Self-pay | Admitting: *Deleted

## 2016-08-04 MED ORDER — METOPROLOL SUCCINATE ER 25 MG PO TB24: 12.5000 mg | ORAL_TABLET | Freq: Every day | ORAL | 0 refills | Status: DC

## 2016-08-07 ENCOUNTER — Ambulatory Visit: Payer: Medicare Other | Admitting: Surgery

## 2016-08-07 ENCOUNTER — Encounter (HOSPITAL_COMMUNITY): Payer: Medicare Other

## 2016-08-09 ENCOUNTER — Ambulatory Visit (HOSPITAL_COMMUNITY): Payer: Medicare Other | Admitting: Physical Therapy

## 2016-08-17 ENCOUNTER — Ambulatory Visit (HOSPITAL_COMMUNITY): Payer: Medicare Other | Attending: Orthopaedic Surgery | Admitting: Physical Therapy

## 2016-08-29 ENCOUNTER — Ambulatory Visit (HOSPITAL_COMMUNITY): Payer: Medicare Other | Admitting: Physical Therapy

## 2016-08-30 ENCOUNTER — Ambulatory Visit: Payer: Medicare Other | Admitting: Orthopaedic Surgery

## 2016-09-01 ENCOUNTER — Ambulatory Visit (INDEPENDENT_AMBULATORY_CARE_PROVIDER_SITE_OTHER): Payer: Medicare Other | Admitting: Family Medicine

## 2016-09-01 ENCOUNTER — Encounter: Payer: Self-pay | Admitting: Family Medicine

## 2016-09-01 VITALS — BP 134/62 | HR 92 | Temp 98.4°F | Resp 18 | Ht 66.0 in | Wt 214.0 lb

## 2016-09-01 DIAGNOSIS — F1020 Alcohol dependence, uncomplicated: Secondary | ICD-10-CM | POA: Insufficient documentation

## 2016-09-01 DIAGNOSIS — I10 Essential (primary) hypertension: Secondary | ICD-10-CM

## 2016-09-01 DIAGNOSIS — I2510 Atherosclerotic heart disease of native coronary artery without angina pectoris: Secondary | ICD-10-CM | POA: Diagnosis not present

## 2016-09-01 DIAGNOSIS — F10288 Alcohol dependence with other alcohol-induced disorder: Secondary | ICD-10-CM | POA: Diagnosis not present

## 2016-09-01 DIAGNOSIS — E559 Vitamin D deficiency, unspecified: Secondary | ICD-10-CM | POA: Insufficient documentation

## 2016-09-01 DIAGNOSIS — N138 Other obstructive and reflux uropathy: Secondary | ICD-10-CM

## 2016-09-01 DIAGNOSIS — K7030 Alcoholic cirrhosis of liver without ascites: Secondary | ICD-10-CM

## 2016-09-01 DIAGNOSIS — E7800 Pure hypercholesterolemia, unspecified: Secondary | ICD-10-CM

## 2016-09-01 DIAGNOSIS — N401 Enlarged prostate with lower urinary tract symptoms: Secondary | ICD-10-CM | POA: Diagnosis not present

## 2016-09-01 LAB — CBC
HCT: 39.2 % (ref 38.5–50.0)
Hemoglobin: 13.2 g/dL (ref 13.2–17.1)
MCH: 33 pg (ref 27.0–33.0)
MCHC: 33.7 g/dL (ref 32.0–36.0)
MCV: 98 fL (ref 80.0–100.0)
MPV: 9.4 fL (ref 7.5–12.5)
PLATELETS: 170 10*3/uL (ref 140–400)
RBC: 4 MIL/uL — AB (ref 4.20–5.80)
RDW: 14.9 % (ref 11.0–15.0)
WBC: 6.6 10*3/uL (ref 3.8–10.8)

## 2016-09-01 LAB — LIPID PANEL
Cholesterol: 170 mg/dL (ref <200)
HDL: 45 mg/dL (ref >40)
LDL CALC: 72 mg/dL (ref <100)
Total CHOL/HDL Ratio: 3.8 Ratio (ref <5.0)
Triglycerides: 265 mg/dL — ABNORMAL HIGH (ref <150)
VLDL: 53 mg/dL — AB (ref <30)

## 2016-09-01 LAB — COMPLETE METABOLIC PANEL WITH GFR
ALT: 19 U/L (ref 9–46)
AST: 35 U/L (ref 10–35)
Albumin: 4.1 g/dL (ref 3.6–5.1)
Alkaline Phosphatase: 38 U/L — ABNORMAL LOW (ref 40–115)
BILIRUBIN TOTAL: 0.5 mg/dL (ref 0.2–1.2)
BUN: 9 mg/dL (ref 7–25)
CHLORIDE: 93 mmol/L — AB (ref 98–110)
CO2: 22 mmol/L (ref 20–31)
CREATININE: 1.04 mg/dL (ref 0.70–1.18)
Calcium: 9.4 mg/dL (ref 8.6–10.3)
GFR, Est African American: 83 mL/min (ref >=60)
GFR, Est Non African American: 71 mL/min (ref >=60)
GLUCOSE: 99 mg/dL (ref 65–99)
Potassium: 4.5 mmol/L (ref 3.5–5.3)
Sodium: 128 mmol/L — ABNORMAL LOW (ref 135–146)
TOTAL PROTEIN: 7.1 g/dL (ref 6.1–8.1)

## 2016-09-01 MED ORDER — PANTOPRAZOLE SODIUM 40 MG PO TBEC: 40.0000 mg | DELAYED_RELEASE_TABLET | Freq: Every day | ORAL | 0 refills | Status: AC

## 2016-09-01 MED ORDER — TAMSULOSIN HCL 0.4 MG PO CAPS: 0.4000 mg | ORAL_CAPSULE | Freq: Every day | ORAL | 3 refills | Status: AC

## 2016-09-01 NOTE — Patient Instructions (Addendum)
Stop alcohol No change in medicines Come back for physical

## 2016-09-01 NOTE — Progress Notes (Signed)
Chief Complaint  Patient presents with  . Hypertension   This is the first visit for Victor Diaz He hasn't had a primary care doctor for some time. He is under the care specialty cardiology. He has multiple medical problems. His coronary artery disease status post bypass, history of heart failure, history of atrial flutter and atrial fibrillation. He is compliant with his medications. He states that he is asymptomatic from a standpoint of chest pain shortness of breath pedal edema or palpitations. His blood pressure is well controlled He is on a statin for lipids. No recent cholesterol testing He is not on a low fat or low cholesterol diet. He does not exercise. He is overweight. He has a history of alcoholic cirrhosis of the liver. He has alcohol-related social problems, he's lost his driver's license, and his job. He lives with family. He continues to drink alcohol. He has not been successful in quitting. He has tred to quit many times. He has attended rehabilitation in the past. He is very vague and evasive when I ask him how much he drinks. He never does answer how many beers he drinks drinks a day or week. He smells of alcohol at the visit. I explained to him with cirrhosis that he shouldn't drink any alcohol. He feels unable to quit. He has a history of anxiety and depression. He is very persistent and requesting Xanax, Valium, lorazepam for his anxiety. He insists nothing else works. He insists that his prior physicians gave him this medication. He also requests Ambien. I reviewed his records and it appears his prior doctors did give him these pills, but stopped them due to safety issues. I explained him that I do not prescribe controlled substances to addicts. He has been told that he should not take mood altering drugs. He is told these are habit forming. Because of his persistence, I stopped the interview and informed him that if he was only at this office to get pain medicine or  benzodiazepines he might want to try another family practice because he will not get them here. He does agree to stay here for primary care and be referred to psychiatry if he needs help with his anxiety. He has a history of prostatic hypertrophy and urinary frequency. He is not on medication. I'm going to start him on Flomax. I discussed with him the pros and cons of PSA testing. He chooses not to have this done.  Patient Active Problem List   Diagnosis Date Noted  . Vitamin D deficiency 09/01/2016  . Alcohol dependence ([place]) 09/01/2016  . Acute systolic congestive heart failure ([place])   . Coronary artery disease involving native coronary artery of native heart without angina pectoris   . Essential hypertension   . Obesity   . Paroxysmal atrial fibrillation (HCC)   . 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 Thrombocytopenia ([place]) 10/16/2014  . Atrial flutter ([place]) 10/16/2014  . Elevated LFTs 10/16/2014  . Hyponatremia 10/14/2014  . Adjustment disorder with mixed anxiety and depressed mood 03/17/2013  . Aortic stenosis 08/03/2011  . Cerebrovascular disease 08/03/2011  . GERD 05/14/2009  . HLD (hyperlipidemia) 12/11/2006  . GOUT 12/11/2006  . Alcoholic cirrhosis of liver without ascites ([place]) 12/11/2006  . ALLERGIC RHINITIS 12/11/2006  . LIVER FUNCTION TESTS, ABNORMAL 12/11/2006    Outpatient Encounter Prescriptions as of 09/01/2016  Medication Sig  . aspirin 325 MG tablet Take 1 tablet (325 mg total) by mouth daily.  Marland Kitchen docusate sodium (COLACE) 100 MG  capsule Take 2 capsules (200 mg total) by mouth daily. (Patient taking differently: Take 100 mg by mouth daily as needed for mild constipation. )  . lisinopril (PRINIVIL,ZESTRIL) 2.5 MG tablet Take 1 tablet (2.5 mg total) by mouth daily.  . metoprolol succinate (TOPROL-XL) 25 MG 24 hr tablet Take 0.5 tablets (12.5 mg total) by mouth daily.  . pantoprazole (PROTONIX) 40 MG tablet Take 1 tablet (40 mg total) by mouth daily.  . simvastatin (ZOCOR) 20 MG  tablet TAKE (1) TABLET BY MOUTH AT BEDTIME.  . [DISCONTINUED] pantoprazole (PROTONIX) 40 MG tablet Take 1 tablet (40 mg total) by mouth daily.  . tamsulosin (FLOMAX) 0.4 MG CAPS capsule Take 1 capsule (0.4 mg total) by mouth daily.  . [DISCONTINUED] cetirizine (ZYRTEC) 10 MG tablet TAKE ONE TABLET BY MOUTH ONCE DAILY AS NEEDED. (Patient not taking: Reported on 09/01/2016)   No facility-administered encounter medications on file as of 09/01/2016.     Past Medical History:  Diagnosis Date  . Abnormal myocardial perfusion study   . Acute blood loss anemia   . AKI (acute kidney injury) ([place])   . Alcohol abuse   . Allergy   . Anemia   . Anxiety   . Arthritis   . Bladder neck obstruction 08/18/2010  . Blood transfusion without reported diagnosis   . CHF (congestive heart failure) ([place])   . Chronic kidney disease   . Complication of anesthesia    GI motility sluggish (bowels got backed up)  . Coronary artery disease   . Debilitated 09/09/2015  . Depression   . Essential hypertension   . Forehead laceration   . GERD (gastroesophageal reflux disease)   . Gout   . Hemothorax on right   . Hyperlipidemia   . Hyperlipidemia 01/30/2015  . Lisfranc's dislocation 10/20/2011  . Paroxysmal atrial flutter (HCC)    Poor anticoagulation candidate with active alcohol abuse and syncope  . PLEURAL EFFUSION, RIGHT 04/07/2010   Qualifier: Diagnosis of  By: Sherlynn Stalls, CMA, [place]   2012 PHYSICIAN:  Nicanor Alcon, M.D. DATE OF BIRTH:  05-20-1945  DATE OF PROCEDURE: DATE OF DISCHARGE:                              OPERATIVE REPORT   PREOPERATIVE DIAGNOSES: 1. Right rib fractures. 2. Chronic hemothorax.  POSTOPERATIVE DIAGNOSES: 1. Right rib fractures. 2. Chronic hemothorax.  OPERATION PERFORMED: 1. Right VATS minithoracotomy wedge. 2. Drainage of hemothorax with decortication.   . Pressure ulcer 09/07/2015  . PSVT (paroxysmal supraventricular tachycardia) ([place])   . Right fibular fracture 10/20/2011  . Subclavian  artery stenosis, left (HCC)    Status post stent placement by Dr. Trula Slade 10/2014  . Tachycardia   . Tachypnea   . Unstable angina ([place]) 08/19/2015    Past Surgical History:  Procedure Laterality Date  . APPENDECTOMY    . CARDIAC CATHETERIZATION N/A 08/19/2015   Procedure: Left Heart Cath and Cors/Grafts Angiography;  Surgeon: Jettie Booze, MD;  Location: [facility];  Service: Cardiovascular;  Laterality: N/A;  . CORONARY ARTERY BYPASS GRAFT N/A 08/20/2015   Procedure: CORONARY ARTERY BYPASS GRAFTING (CABG) TIMES FOUR  UTILIZING THE LEFT INTERNAL MAMMARY ARTERY AND ENDOSCOPICALLY HARVESTED RIGHT SAPHENEOUS VEINS. CLIPPING OF ATRIAL APPENDAGE;  Surgeon: Ivin Poot, MD;  Location: [facility];  Service: Open Heart Surgery;  Laterality: N/A;  . HERNIA REPAIR    . INCISIONAL HERNIA REPAIR N/A 04/05/2016   Procedure:  LAPAROSCOPIC TEP INCISIONAL HERNIA REPAIR WITH MESH;  Surgeon: Ralene Ok, MD;  Location: [facility];  Service: General;  Laterality: N/A;  . INSERTION OF MESH N/A 04/05/2016   Procedure: INSERTION OF MESH;  Surgeon: Ralene Ok, MD;  Location: [facility];  Service: General;  Laterality: N/A;  . LAPAROTOMY N/A 08/25/2015   Procedure: EXPLORATORY LAPAROTOMY;  Surgeon: Ralene Ok, MD;  Location: [facility];  Service: General;  Laterality: N/A;  . PARTIAL COLECTOMY N/A 08/25/2015   Procedure: ASCENDING AND TRANSVERSE COLECTOMY;  Surgeon: Ralene Ok, MD;  Location: [facility];  Service: General;  Laterality: N/A;  . PARTIAL HIP ARTHROPLASTY Left 2010  . PERIPHERAL VASCULAR CATHETERIZATION N/A 11/11/2014   Procedure: Aortic Arch Angiography/Left Subclavion Stent;  Surgeon: Serafina Mitchell, MD;  Location: [facility];  Service: Cardiovascular;  Laterality: N/A;  . REVISION TOTAL HIP ARTHROPLASTY  08/28/2008  . TEE WITHOUT CARDIOVERSION N/A 08/20/2015   Procedure: TRANSESOPHAGEAL ECHOCARDIOGRAM (TEE);  Surgeon: Ivin Poot, MD;  Location: [facility];  Service: Open Heart Surgery;   Laterality: N/A;  . TONSILECTOMY, ADENOIDECTOMY, BILATERAL MYRINGOTOMY AND TUBES      Social History   Social History  . Marital status: Widowed    Spouse name: N/A  . Number of children: 2  . Years of education: 14   Occupational History  . disabled     sales rep   Social History Main Topics  . Smoking status: Former Smoker    Packs/day: 1.00    Years: 10.00    Types: Cigarettes    Start date: 11/27/1957    Quit date: 08/09/1969  . Smokeless tobacco: Never Used  . Alcohol use 7.2 oz/week    12 Cans of beer per week     Comment: occasionally  . Drug use: No  . Sexual activity: Not Currently   Other Topics Concern  . Not on file   Social History Narrative   Lives in [place],  Has two sons who live with him   He is no longer working - lost his license.    He does not have a valid driver's license because of alcohol-related in fractions      Son drives him ;has MS, unemployed - [place] home also          Family History  Problem Relation Age of Onset  . Arthritis Mother   . Cerebral aneurysm Mother   . Tuberculosis Father   . Heart disease Father   . Multiple sclerosis Son   . Bipolar disorder Son   . Arthritis Son        hip replacement  . Heart disease Paternal Grandfather     Review of Systems  Constitutional: Positive for malaise/fatigue. Negative for chills, fever and weight loss.  HENT: Negative for congestion and hearing loss.        Needs dental care  Eyes: Negative for blurred vision and pain.       No recent eye exam  Respiratory: Negative for cough and shortness of breath.   Cardiovascular: Negative for chest pain, palpitations and leg swelling.  Gastrointestinal: Negative for abdominal pain, constipation, diarrhea and heartburn.  Genitourinary: Positive for frequency. Negative for dysuria.  Musculoskeletal: Positive for back pain. Negative for falls, joint pain and myalgias.  Neurological: Positive for weakness. Negative for  dizziness, seizures and headaches.  Psychiatric/Behavioral: Positive for depression. Negative for substance abuse and suicidal ideas. The patient is nervous/anxious and has insomnia.     BP  134/62 (BP Location: Right Arm, Patient Position: Sitting, Cuff Size: Normal)   Pulse 92   Temp 98.4 F (36.9 C) (Temporal)   Resp 18   Ht 5\' 6"  (1.676 m)   Wt 214 lb (97.1 kg)   SpO2 94%   BMI 34.54 kg/m   Physical Exam  Constitutional: He appears well-developed and well-nourished.  Protuberant abdomen. Slightly disheveled. Smells of alcohol.  HENT:  Head: Normocephalic and atraumatic.  Mouth/Throat: Oropharynx is clear and moist.  Needs dental care  Eyes: Conjunctivae are normal. Pupils are equal, round, and reactive to light.  Neck: Normal range of motion. Neck supple. No thyromegaly present.  Cardiovascular: Normal rate and regular rhythm.   Frequent ectopy. Systolic murmur  Pulmonary/Chest: Effort normal and breath sounds normal. No respiratory distress.  Abdominal: Soft. Bowel sounds are normal. He exhibits distension.  No tenderness  Musculoskeletal: Normal range of motion. He exhibits no edema.  Lymphadenopathy:    He has no cervical adenopathy.  Neurological: He is alert.  Gait normal  Skin: Skin is warm and dry.  Psychiatric: Thought content normal. His mood appears anxious. His affect is inappropriate. His speech is tangential. Cognition and memory are normal. He expresses inappropriate judgment. He exhibits a depressed mood.  Persistent requests for controlled substances denied. He is evasive at times. Circular doesn't difficult to get answers from.  Nursing note and vitals reviewed. ASSESSMENT/PLAN:   1. Pure hypercholesterolemia Check lipid panel  2. Coronary artery disease involving native coronary artery of native heart without angina pectoris Under care of cardiology - CBC - COMPLETE METABOLIC PANEL WITH GFR - Lipid panel - Urinalysis, Routine w reflex  microscopic  3. Essential hypertension Controlled  4. BPH with obstruction/lower urinary tract symptoms Add Flomax   5. Vitamin D deficiency History of - VITAMIN D 25 Hydroxy (Vit-D Deficiency, Fractures)  6. Alcoholic cirrhosis of liver without ascites (HCC) - Vitamin B12  7. Alcohol dependence with other alcohol-induced disorder ([place])  Greater than 50% of this visit was spent in counseling and coordinating care.  Total face to face time:   60 minutes. Much of this is discussion about his anxiety and treatment and my limitations in prescribing medications for him. He spent time reviewing his Medical history. We spent much time discussing the reasons he should quit alcohol, the methods used to quit alcohol, and my recommendation that he quit alcohol and attend rehabilitation or start AA   Patient Instructions  Stop alcohol No change in medicines Come back for physical   Raylene Everts, MD

## 2016-09-02 LAB — URINALYSIS, ROUTINE W REFLEX MICROSCOPIC
Bilirubin Urine: NEGATIVE
Glucose, UA: NEGATIVE
Hgb urine dipstick: NEGATIVE
Ketones, ur: NEGATIVE
LEUKOCYTES UA: NEGATIVE
NITRITE: NEGATIVE
PH: 5.5 (ref 5.0–8.0)
Protein, ur: NEGATIVE
Specific Gravity, Urine: 1.008 (ref 1.001–1.035)

## 2016-09-02 LAB — VITAMIN D 25 HYDROXY (VIT D DEFICIENCY, FRACTURES): VIT D 25 HYDROXY: 10 ng/mL — AB (ref 30–100)

## 2016-09-02 LAB — PSA: PSA: 0.2 ng/mL (ref <=4.0)

## 2016-09-02 LAB — VITAMIN B12: VITAMIN B 12: 203 pg/mL (ref 200–1100)

## 2016-09-04 ENCOUNTER — Encounter: Payer: Self-pay | Admitting: Family Medicine

## 2016-09-04 MED ORDER — VITAMIN D (ERGOCALCIFEROL) 1.25 MG (50000 UNIT) PO CAPS: 50000.0000 [IU] | ORAL_CAPSULE | ORAL | 0 refills | Status: AC

## 2016-09-06 ENCOUNTER — Encounter: Payer: Self-pay | Admitting: Cardiology

## 2016-09-06 NOTE — Progress Notes (Signed)
Cardiology Office Note  Date: 09/07/2016   ID: TYWONE Diaz, DOB October 02, 1945, MRN [id number]  PCP: Victor Everts, MD  Primary Cardiologist: Victor Lesches, MD   Chief Complaint  Patient presents with  . Coronary Artery Disease    History of Present Illness: Victor Diaz is a 71 y.o. male last seen in November 2017 for preoperative evaluation prior to incisional hernia repair which was ultimately completed in December without perioperative cardiac events. He presents today for a routine follow-up visit. States that he has had no angina symptoms. He is sedentary, states that he does not get out of the house much. Also drinks alcohol to excess regularly. He states that he knows that he needs to cut back, but has not been able to as yet.  He recently established with Dr. Meda Diaz for primary care, I reviewed the note.  We went over his current medications. Discussed increasing Toprol-XL to 25 mg daily instead of a half tablet daily. He had recent lipids obtained as outlined below, LDL was 72 on Zocor. LFTs were normal.  Past Medical History:  Diagnosis Date  . Acute blood loss anemia   . AKI (acute kidney injury) ([place])   . Alcohol abuse   . Allergy   . Anemia   . Anxiety   . Arthritis   . Bladder neck obstruction 08/18/2010  . Chronic kidney disease   . Coronary artery disease    Multivessel status post CABG May 2017  . Depression   . Essential hypertension   . Forehead laceration   . GERD (gastroesophageal reflux disease)   . Gout   . Hemothorax on right   . History of blood transfusion   . Hyperlipidemia   . Hyperlipidemia 01/30/2015  . Lisfranc's dislocation 10/20/2011  . Paroxysmal atrial flutter ([facility])    Poor anticoagulation candidate with active alcohol abuse and syncope  . Pressure ulcer 09/07/2015  . PSVT (paroxysmal supraventricular tachycardia) ([place])   . Right fibular fracture 10/20/2011  . Subclavian artery stenosis, left ([facility])    Status post stent  placement by Victor Diaz 10/2014    Past Surgical History:  Procedure Laterality Date  . APPENDECTOMY    . CARDIAC CATHETERIZATION N/A 08/19/2015   Procedure: Left Heart Cath and Cors/Grafts Angiography;  Surgeon: Victor Booze, MD;  Location: [facility];  Service: Cardiovascular;  Laterality: N/A;  . CORONARY ARTERY BYPASS GRAFT N/A 08/20/2015   Procedure: CORONARY ARTERY BYPASS GRAFTING (CABG) TIMES FOUR  UTILIZING THE LEFT INTERNAL MAMMARY ARTERY AND ENDOSCOPICALLY HARVESTED RIGHT SAPHENEOUS VEINS. CLIPPING OF ATRIAL APPENDAGE;  Surgeon: Victor Poot, MD;  Location: [facility];  Service: Open Heart Surgery;  Laterality: N/A;  . HERNIA REPAIR    . INCISIONAL HERNIA REPAIR N/A 04/05/2016   Procedure: LAPAROSCOPIC TEP INCISIONAL HERNIA REPAIR WITH MESH;  Surgeon: Victor Ok, MD;  Location: [facility];  Service: General;  Laterality: N/A;  . INSERTION OF MESH N/A 04/05/2016   Procedure: INSERTION OF MESH;  Surgeon: Victor Ok, MD;  Location: [facility];  Service: General;  Laterality: N/A;  . LAPAROTOMY N/A 08/25/2015   Procedure: EXPLORATORY LAPAROTOMY;  Surgeon: Victor Ok, MD;  Location: [facility];  Service: General;  Laterality: N/A;  . PARTIAL COLECTOMY N/A 08/25/2015   Procedure: ASCENDING AND TRANSVERSE COLECTOMY;  Surgeon: Victor Ok, MD;  Location: [facility];  Service: General;  Laterality: N/A;  . PARTIAL HIP ARTHROPLASTY Left 2010  . PERIPHERAL VASCULAR CATHETERIZATION N/A 11/11/2014   Procedure: Aortic  Arch Angiography/Left Subclavion Stent;  Surgeon: Victor Mitchell, MD;  Location: [facility];  Service: Cardiovascular;  Laterality: N/A;  . REVISION TOTAL HIP ARTHROPLASTY  08/28/2008  . TEE WITHOUT CARDIOVERSION N/A 08/20/2015   Procedure: TRANSESOPHAGEAL ECHOCARDIOGRAM (TEE);  Surgeon: Victor Poot, MD;  Location: [facility];  Service: Open Heart Surgery;  Laterality: N/A;  . TONSILECTOMY, ADENOIDECTOMY, BILATERAL MYRINGOTOMY AND TUBES      Current Outpatient  Prescriptions  Medication Sig Dispense Refill  . aspirin 325 MG tablet Take 1 tablet (325 mg total) by mouth daily.    Marland Kitchen lisinopril (PRINIVIL,ZESTRIL) 2.5 MG tablet Take 1 tablet (2.5 mg total) by mouth daily. 30 tablet 6  . metoprolol succinate (TOPROL-XL) 25 MG 24 hr tablet Take 0.5 tablets (12.5 mg total) by mouth daily. 45 tablet 0  . pantoprazole (PROTONIX) 40 MG tablet Take 1 tablet (40 mg total) by mouth daily. 90 tablet 0  . simvastatin (ZOCOR) 20 MG tablet TAKE (1) TABLET BY MOUTH AT BEDTIME. 30 tablet 6  . tamsulosin (FLOMAX) 0.4 MG CAPS capsule Take 1 capsule (0.4 mg total) by mouth daily. 30 capsule 3  . Vitamin D, Ergocalciferol, (DRISDOL) 50000 units CAPS capsule Take 1 capsule (50,000 Units total) by mouth every 7 (seven) days. 12 capsule 0   No current facility-administered medications for this visit.    Allergies:  Atorvastatin   Social History: The patient  reports that he quit smoking about 47 years ago. His smoking use included Cigarettes. He started smoking about 58 years ago. He has a 10.00 pack-year smoking history. He has never used smokeless tobacco. He reports that he drinks about 7.2 oz of alcohol per week . He reports that he does not use drugs.   ROS:  Please see the history of present illness. Otherwise, complete review of systems is positive for chronic back pain.  All other systems are reviewed and negative.   Physical Exam: VS:  BP 108/72   Pulse 89   Ht 5\' 6"  (1.676 m)   Wt 216 lb (98 kg)   SpO2 99%   BMI 34.86 kg/m , BMI Body mass index is 34.86 kg/m.  Wt Readings from Last 3 Encounters:  09/07/16 216 lb (98 kg)  09/01/16 214 lb (97.1 kg)  08/02/16 212 lb (96.2 kg)    General: Obese male, appears comfortable at rest. HEENT: Conjunctiva and lids normal, oropharynx clear. Neck: Supple, no elevated JVP or carotid bruits, no thyromegaly. Lungs: Clear to auscultation, nonlabored breathing at rest. Cardiac: Regular rate and rhythm, no S3, 5-4/6  systolic murmur, no pericardial rub. Abdomen: Protuberant, nontender, bowel sounds present, healed lower midline incision, no guarding or rebound. Extremities: Mild lower leg edema, distal pulses 2+. Skin: Warm and dry. Musculoskeletal: No kyphosis. Neuropsychiatric: Alert and oriented x3, affect grossly appropriate.  ECG: I personally reviewed the tracing from 03/01/2016 showed sinus rhythm with old inferior infarct pattern.  Recent Labwork: 09/01/2016: ALT 19; AST 35; BUN 9; Creat 1.04; Hemoglobin 13.2; Platelets 170; Potassium 4.5; Sodium 128     Component Value Date/Time   CHOL 170 09/01/2016 1404   TRIG 265 (H) 09/01/2016 1404   TRIG 67 04/05/2006 1012   HDL 45 09/01/2016 1404   CHOLHDL 3.8 09/01/2016 1404   VLDL 53 (H) 09/01/2016 1404   LDLCALC 72 09/01/2016 1404   LDLDIRECT 85.3 12/06/2012 1457    Other Studies Reviewed Today:  Cardiac catheterization 08/19/2015:  Ost Cx to Prox Cx lesion, 100% stenosed. Left to left  collaterals fill this vessel.  Prox LAD to Mid LAD lesion, 70% stenosed.  Mid LAD lesion, 75% stenosed.  Ost 2nd Diag to 2nd Diag lesion, 75% stenosed.  Ost RCA to Prox RCA lesion, 90% stenosed.  Mid RCA lesion, 50% stenosed.  Normal LVEDP.  Patent left subclavian stent and LIMA.  TEE 08/20/2015 (intraoperative): Study Conclusions  - Left ventricle: Systolic function was mildly to moderately reduced. The estimated ejection fraction was in the range of 40% to 45%. Diffuse hypokinesis. - Aortic valve: Cusp separation was mildly reduced. Right coronary cusp mobility was restricted. There was mild stenosis. There was trivial regurgitation originating from the central coaptation point. Valve area (VTI): 1.08 cm^2. Valve area (Vmax): 1.05 cm^2. Valve area (Vmean): 1.06 cm^2. - Mitral valve: Leaflet separation was reduced. There was mild regurgitation, with multiple jets directed centrally. Valve area by continuity equation (using LVOT  flow): 1.9 cm^2. - Left atrium: No evidence of thrombus in the atrial cavity or appendage. - Atrial septum: No defect or patent foramen ovale was identified. - Tricuspid valve: No evidence of vegetation. - Pulmonic valve: No evidence of vegetation.  Assessment and Plan:  1. Multivessel CAD status post CABG in May 2017. He does not report any active angina symptoms. Plan to continue medical therapy and observation for now.  2. Cardiomyopathy with LVEF approximately 45%. Continue medical therapy, Toprol-XL will be up titrated. Patient also needs to cut back on alcohol use as this may also be associated with his cardiomyopathy. We discussed this today.  3. History of postoperative atrial fibrillation. He is no longer on antiarrhythmic therapy. Not anticoagulated long-term. Continue observation.  4. Essential hypertension, blood pressure is well controlled today.  5. Mild aortic stenosis by echocardiogram last year. No change on examination.  Current medicines were reviewed with the patient today.  Disposition: Follow-up in 6 months.  Signed, Satira Sark, MD, [facility] 09/07/2016 10:54 AM    [facility] [place], [place],  [address] Phone: [phone]; Fax: [phone]

## 2016-09-07 ENCOUNTER — Encounter: Payer: Self-pay | Admitting: Cardiology

## 2016-09-07 ENCOUNTER — Ambulatory Visit (INDEPENDENT_AMBULATORY_CARE_PROVIDER_SITE_OTHER): Payer: Medicare Other | Admitting: Cardiology

## 2016-09-07 VITALS — BP 108/72 | HR 89 | Ht 66.0 in | Wt 216.0 lb

## 2016-09-07 DIAGNOSIS — I10 Essential (primary) hypertension: Secondary | ICD-10-CM

## 2016-09-07 DIAGNOSIS — I2510 Atherosclerotic heart disease of native coronary artery without angina pectoris: Secondary | ICD-10-CM | POA: Diagnosis not present

## 2016-09-07 DIAGNOSIS — I9789 Other postprocedural complications and disorders of the circulatory system, not elsewhere classified: Secondary | ICD-10-CM

## 2016-09-07 DIAGNOSIS — I4891 Unspecified atrial fibrillation: Secondary | ICD-10-CM

## 2016-09-07 DIAGNOSIS — I350 Nonrheumatic aortic (valve) stenosis: Secondary | ICD-10-CM | POA: Diagnosis not present

## 2016-09-07 DIAGNOSIS — I429 Cardiomyopathy, unspecified: Secondary | ICD-10-CM

## 2016-09-07 MED ORDER — METOPROLOL TARTRATE 25 MG PO TABS: 25.0000 mg | ORAL_TABLET | Freq: Every day | ORAL | 3 refills | Status: AC

## 2016-09-07 NOTE — Patient Instructions (Signed)

## 2016-09-13 ENCOUNTER — Other Ambulatory Visit: Payer: Self-pay | Admitting: Cardiology

## 2016-09-25 ENCOUNTER — Encounter: Payer: Self-pay | Admitting: Family

## 2016-10-03 ENCOUNTER — Ambulatory Visit: Payer: Medicare Other | Admitting: Family Medicine

## 2016-10-06 ENCOUNTER — Ambulatory Visit (HOSPITAL_COMMUNITY)
Admission: RE | Admit: 2016-10-06 | Payer: Medicare Other | Source: Ambulatory Visit | Attending: Surgery | Admitting: Surgery

## 2016-10-06 ENCOUNTER — Ambulatory Visit: Payer: Medicare Other | Admitting: Family

## 2016-10-15 DEATH — deceased

## 2017-06-25 ENCOUNTER — Encounter: Payer: Self-pay | Admitting: Family Medicine

## 2018-05-20 ENCOUNTER — Telehealth: Payer: Self-pay

## 2018-05-20 NOTE — Telephone Encounter (Signed)
Numerous attempts to contact patient with recall letters. Unable to reach by telephone. with no success.  09/07/2016 11:39 AM New [10]      11/15/2016 11:05 PM Notification Sent [20]    02/06/2018 9:20 AM Notification Sent [20]    05/01/2018 1:36 PM Notification Sent [20]
